# Patient Record
Sex: Female | Born: 1945 | Race: Black or African American | Hispanic: No | Smoking: Never smoker
Health system: Southern US, Community
[De-identification: ages and names within clinical notes are randomized; demographics above are authoritative.]

## PROBLEM LIST (undated history)

## (undated) DIAGNOSIS — K589 Irritable bowel syndrome without diarrhea: Secondary | ICD-10-CM

## (undated) DIAGNOSIS — F419 Anxiety disorder, unspecified: Secondary | ICD-10-CM

## (undated) DIAGNOSIS — R519 Headache, unspecified: Secondary | ICD-10-CM

## (undated) DIAGNOSIS — E785 Hyperlipidemia, unspecified: Secondary | ICD-10-CM

## (undated) DIAGNOSIS — E079 Disorder of thyroid, unspecified: Secondary | ICD-10-CM

## (undated) DIAGNOSIS — E230 Hypopituitarism: Secondary | ICD-10-CM

## (undated) DIAGNOSIS — R51 Headache: Secondary | ICD-10-CM

## (undated) DIAGNOSIS — K219 Gastro-esophageal reflux disease without esophagitis: Secondary | ICD-10-CM

## (undated) HISTORY — PX: BRAIN SURGERY: SHX531

## (undated) HISTORY — DX: Gastro-esophageal reflux disease without esophagitis: K21.9

## (undated) HISTORY — DX: Irritable bowel syndrome, unspecified: K58.9

## (undated) HISTORY — DX: Hyperlipidemia, unspecified: E78.5

## (undated) HISTORY — DX: Anxiety disorder, unspecified: F41.9

## (undated) HISTORY — DX: Headache, unspecified: R51.9

## (undated) HISTORY — DX: Hypopituitarism: E23.0

## (undated) HISTORY — PX: BREAST EXCISIONAL BIOPSY: SUR124

## (undated) HISTORY — DX: Headache: R51

## (undated) HISTORY — DX: Disorder of thyroid, unspecified: E07.9

---

## 1971-02-05 HISTORY — PX: TONSILLECTOMY: SUR1361

## 1988-02-05 HISTORY — PX: ABDOMINAL HYSTERECTOMY: SHX81

## 1992-02-05 HISTORY — PX: OVARIAN CYST SURGERY: SHX726

## 1997-08-26 ENCOUNTER — Ambulatory Visit (HOSPITAL_COMMUNITY): Admission: RE | Admit: 1997-08-26 | Discharge: 1997-08-26 | Payer: Self-pay | Admitting: *Deleted

## 1997-12-14 ENCOUNTER — Encounter: Payer: Self-pay | Admitting: Emergency Medicine

## 1997-12-14 ENCOUNTER — Emergency Department (HOSPITAL_COMMUNITY): Admission: EM | Admit: 1997-12-14 | Discharge: 1997-12-14 | Payer: Self-pay | Admitting: Emergency Medicine

## 1997-12-15 ENCOUNTER — Ambulatory Visit (HOSPITAL_COMMUNITY): Admission: RE | Admit: 1997-12-15 | Discharge: 1997-12-15 | Payer: Self-pay | Admitting: *Deleted

## 1997-12-27 ENCOUNTER — Ambulatory Visit (HOSPITAL_COMMUNITY): Admission: RE | Admit: 1997-12-27 | Discharge: 1997-12-27 | Payer: Self-pay | Admitting: *Deleted

## 1998-02-09 ENCOUNTER — Ambulatory Visit (HOSPITAL_COMMUNITY): Admission: RE | Admit: 1998-02-09 | Discharge: 1998-02-09 | Payer: Self-pay | Admitting: Gastroenterology

## 1999-01-05 ENCOUNTER — Ambulatory Visit (HOSPITAL_COMMUNITY): Admission: RE | Admit: 1999-01-05 | Discharge: 1999-01-05 | Payer: Self-pay | Admitting: *Deleted

## 1999-03-06 ENCOUNTER — Ambulatory Visit (HOSPITAL_COMMUNITY): Admission: RE | Admit: 1999-03-06 | Discharge: 1999-03-06 | Payer: Self-pay | Admitting: *Deleted

## 1999-06-20 ENCOUNTER — Encounter: Admission: RE | Admit: 1999-06-20 | Discharge: 1999-09-18 | Payer: Self-pay | Admitting: *Deleted

## 1999-08-28 ENCOUNTER — Encounter: Payer: Self-pay | Admitting: Gastroenterology

## 1999-08-28 ENCOUNTER — Encounter: Admission: RE | Admit: 1999-08-28 | Discharge: 1999-08-28 | Payer: Self-pay | Admitting: Gastroenterology

## 2000-01-08 ENCOUNTER — Encounter: Payer: Self-pay | Admitting: Obstetrics and Gynecology

## 2000-01-08 ENCOUNTER — Ambulatory Visit (HOSPITAL_COMMUNITY): Admission: RE | Admit: 2000-01-08 | Discharge: 2000-01-08 | Payer: Self-pay | Admitting: Obstetrics and Gynecology

## 2000-02-25 ENCOUNTER — Encounter: Payer: Self-pay | Admitting: Obstetrics and Gynecology

## 2000-02-25 ENCOUNTER — Encounter: Admission: RE | Admit: 2000-02-25 | Discharge: 2000-02-25 | Payer: Self-pay | Admitting: Obstetrics and Gynecology

## 2000-04-16 ENCOUNTER — Encounter: Payer: Self-pay | Admitting: Emergency Medicine

## 2000-04-16 ENCOUNTER — Emergency Department (HOSPITAL_COMMUNITY): Admission: EM | Admit: 2000-04-16 | Discharge: 2000-04-16 | Payer: Self-pay | Admitting: Emergency Medicine

## 2000-04-24 ENCOUNTER — Encounter: Admission: RE | Admit: 2000-04-24 | Discharge: 2000-06-23 | Payer: Self-pay | Admitting: Internal Medicine

## 2000-07-30 ENCOUNTER — Ambulatory Visit (HOSPITAL_COMMUNITY): Admission: RE | Admit: 2000-07-30 | Discharge: 2000-07-30 | Payer: Self-pay | Admitting: Gastroenterology

## 2001-01-06 ENCOUNTER — Encounter: Payer: Self-pay | Admitting: Internal Medicine

## 2001-01-06 ENCOUNTER — Ambulatory Visit (HOSPITAL_COMMUNITY): Admission: RE | Admit: 2001-01-06 | Discharge: 2001-01-06 | Payer: Self-pay | Admitting: Internal Medicine

## 2001-03-10 ENCOUNTER — Other Ambulatory Visit: Admission: RE | Admit: 2001-03-10 | Discharge: 2001-03-10 | Payer: Self-pay | Admitting: Obstetrics and Gynecology

## 2002-01-07 ENCOUNTER — Ambulatory Visit (HOSPITAL_COMMUNITY): Admission: RE | Admit: 2002-01-07 | Discharge: 2002-01-07 | Payer: Self-pay | Admitting: Gastroenterology

## 2002-01-12 ENCOUNTER — Ambulatory Visit (HOSPITAL_COMMUNITY): Admission: RE | Admit: 2002-01-12 | Discharge: 2002-01-12 | Payer: Self-pay | Admitting: Internal Medicine

## 2002-01-12 ENCOUNTER — Encounter: Payer: Self-pay | Admitting: Internal Medicine

## 2002-03-11 ENCOUNTER — Other Ambulatory Visit: Admission: RE | Admit: 2002-03-11 | Discharge: 2002-03-11 | Payer: Self-pay | Admitting: Obstetrics and Gynecology

## 2002-07-14 ENCOUNTER — Inpatient Hospital Stay (HOSPITAL_COMMUNITY): Admission: RE | Admit: 2002-07-14 | Discharge: 2002-07-15 | Payer: Self-pay | Admitting: Orthopaedic Surgery

## 2003-01-17 ENCOUNTER — Ambulatory Visit (HOSPITAL_COMMUNITY): Admission: RE | Admit: 2003-01-17 | Discharge: 2003-01-17 | Payer: Self-pay | Admitting: Internal Medicine

## 2003-03-11 ENCOUNTER — Emergency Department (HOSPITAL_COMMUNITY): Admission: EM | Admit: 2003-03-11 | Discharge: 2003-03-11 | Payer: Self-pay | Admitting: Emergency Medicine

## 2004-01-18 ENCOUNTER — Ambulatory Visit (HOSPITAL_COMMUNITY): Admission: RE | Admit: 2004-01-18 | Discharge: 2004-01-18 | Payer: Self-pay | Admitting: Internal Medicine

## 2004-03-15 ENCOUNTER — Emergency Department (HOSPITAL_COMMUNITY): Admission: EM | Admit: 2004-03-15 | Discharge: 2004-03-15 | Payer: Self-pay | Admitting: Emergency Medicine

## 2004-04-27 ENCOUNTER — Ambulatory Visit (HOSPITAL_COMMUNITY): Admission: RE | Admit: 2004-04-27 | Discharge: 2004-04-27 | Payer: Self-pay | Admitting: Gastroenterology

## 2004-07-13 ENCOUNTER — Encounter: Admission: RE | Admit: 2004-07-13 | Discharge: 2004-10-11 | Payer: Self-pay | Admitting: Internal Medicine

## 2014-02-04 HISTORY — PX: REPAIR RECTOCELE: SUR1206

## 2014-03-09 DIAGNOSIS — K219 Gastro-esophageal reflux disease without esophagitis: Secondary | ICD-10-CM | POA: Diagnosis not present

## 2014-03-09 DIAGNOSIS — E785 Hyperlipidemia, unspecified: Secondary | ICD-10-CM | POA: Diagnosis not present

## 2014-03-09 DIAGNOSIS — R194 Change in bowel habit: Secondary | ICD-10-CM | POA: Diagnosis not present

## 2014-03-31 DIAGNOSIS — K648 Other hemorrhoids: Secondary | ICD-10-CM | POA: Diagnosis not present

## 2014-03-31 DIAGNOSIS — D490 Neoplasm of unspecified behavior of digestive system: Secondary | ICD-10-CM | POA: Diagnosis not present

## 2014-03-31 DIAGNOSIS — D122 Benign neoplasm of ascending colon: Secondary | ICD-10-CM | POA: Diagnosis not present

## 2014-03-31 DIAGNOSIS — K649 Unspecified hemorrhoids: Secondary | ICD-10-CM | POA: Diagnosis not present

## 2014-03-31 DIAGNOSIS — K633 Ulcer of intestine: Secondary | ICD-10-CM | POA: Diagnosis not present

## 2014-03-31 DIAGNOSIS — R194 Change in bowel habit: Secondary | ICD-10-CM | POA: Diagnosis not present

## 2014-03-31 DIAGNOSIS — K635 Polyp of colon: Secondary | ICD-10-CM | POA: Diagnosis not present

## 2014-03-31 DIAGNOSIS — K6289 Other specified diseases of anus and rectum: Secondary | ICD-10-CM | POA: Diagnosis not present

## 2014-04-04 DIAGNOSIS — E785 Hyperlipidemia, unspecified: Secondary | ICD-10-CM | POA: Diagnosis not present

## 2014-04-04 DIAGNOSIS — K589 Irritable bowel syndrome without diarrhea: Secondary | ICD-10-CM | POA: Diagnosis not present

## 2014-04-04 DIAGNOSIS — M2660 Temporomandibular joint disorder, unspecified: Secondary | ICD-10-CM | POA: Diagnosis not present

## 2014-04-09 DIAGNOSIS — M6281 Muscle weakness (generalized): Secondary | ICD-10-CM | POA: Diagnosis not present

## 2014-04-09 DIAGNOSIS — E785 Hyperlipidemia, unspecified: Secondary | ICD-10-CM | POA: Diagnosis not present

## 2014-04-09 DIAGNOSIS — E559 Vitamin D deficiency, unspecified: Secondary | ICD-10-CM | POA: Diagnosis not present

## 2014-04-09 DIAGNOSIS — D649 Anemia, unspecified: Secondary | ICD-10-CM | POA: Diagnosis not present

## 2014-04-18 DIAGNOSIS — B351 Tinea unguium: Secondary | ICD-10-CM | POA: Diagnosis not present

## 2014-04-18 DIAGNOSIS — B078 Other viral warts: Secondary | ICD-10-CM | POA: Diagnosis not present

## 2014-04-27 DIAGNOSIS — K635 Polyp of colon: Secondary | ICD-10-CM | POA: Diagnosis not present

## 2014-04-27 DIAGNOSIS — E785 Hyperlipidemia, unspecified: Secondary | ICD-10-CM | POA: Diagnosis not present

## 2014-04-27 DIAGNOSIS — K5900 Constipation, unspecified: Secondary | ICD-10-CM | POA: Diagnosis not present

## 2014-05-27 DIAGNOSIS — N816 Rectocele: Secondary | ICD-10-CM | POA: Diagnosis not present

## 2014-05-27 DIAGNOSIS — K5902 Outlet dysfunction constipation: Secondary | ICD-10-CM | POA: Diagnosis not present

## 2014-06-06 DIAGNOSIS — K5900 Constipation, unspecified: Secondary | ICD-10-CM | POA: Diagnosis not present

## 2014-06-06 DIAGNOSIS — N816 Rectocele: Secondary | ICD-10-CM | POA: Diagnosis not present

## 2014-06-27 DIAGNOSIS — E785 Hyperlipidemia, unspecified: Secondary | ICD-10-CM | POA: Diagnosis not present

## 2014-06-27 DIAGNOSIS — R079 Chest pain, unspecified: Secondary | ICD-10-CM | POA: Diagnosis not present

## 2014-07-01 DIAGNOSIS — N816 Rectocele: Secondary | ICD-10-CM | POA: Diagnosis not present

## 2014-07-01 DIAGNOSIS — K5902 Outlet dysfunction constipation: Secondary | ICD-10-CM | POA: Diagnosis not present

## 2014-07-19 DIAGNOSIS — N816 Rectocele: Secondary | ICD-10-CM | POA: Diagnosis not present

## 2014-08-03 DIAGNOSIS — Z86010 Personal history of colonic polyps: Secondary | ICD-10-CM | POA: Diagnosis not present

## 2014-08-03 DIAGNOSIS — K5900 Constipation, unspecified: Secondary | ICD-10-CM | POA: Diagnosis not present

## 2014-08-03 DIAGNOSIS — K30 Functional dyspepsia: Secondary | ICD-10-CM | POA: Diagnosis not present

## 2014-08-03 DIAGNOSIS — N816 Rectocele: Secondary | ICD-10-CM | POA: Diagnosis not present

## 2014-08-30 DIAGNOSIS — Z803 Family history of malignant neoplasm of breast: Secondary | ICD-10-CM | POA: Diagnosis not present

## 2014-08-30 DIAGNOSIS — Z1231 Encounter for screening mammogram for malignant neoplasm of breast: Secondary | ICD-10-CM | POA: Diagnosis not present

## 2014-08-31 DIAGNOSIS — K2900 Acute gastritis without bleeding: Secondary | ICD-10-CM | POA: Diagnosis not present

## 2014-08-31 DIAGNOSIS — Q401 Congenital hiatus hernia: Secondary | ICD-10-CM | POA: Diagnosis not present

## 2014-08-31 DIAGNOSIS — K529 Noninfective gastroenteritis and colitis, unspecified: Secondary | ICD-10-CM | POA: Diagnosis not present

## 2014-08-31 DIAGNOSIS — I10 Essential (primary) hypertension: Secondary | ICD-10-CM | POA: Diagnosis not present

## 2014-08-31 DIAGNOSIS — K208 Other esophagitis: Secondary | ICD-10-CM | POA: Diagnosis not present

## 2014-08-31 DIAGNOSIS — K6389 Other specified diseases of intestine: Secondary | ICD-10-CM | POA: Diagnosis not present

## 2014-08-31 DIAGNOSIS — R197 Diarrhea, unspecified: Secondary | ICD-10-CM | POA: Diagnosis not present

## 2014-08-31 DIAGNOSIS — K228 Other specified diseases of esophagus: Secondary | ICD-10-CM | POA: Diagnosis not present

## 2014-08-31 DIAGNOSIS — K449 Diaphragmatic hernia without obstruction or gangrene: Secondary | ICD-10-CM | POA: Diagnosis not present

## 2014-08-31 DIAGNOSIS — K3189 Other diseases of stomach and duodenum: Secondary | ICD-10-CM | POA: Diagnosis not present

## 2014-08-31 DIAGNOSIS — K210 Gastro-esophageal reflux disease with esophagitis: Secondary | ICD-10-CM | POA: Diagnosis not present

## 2014-08-31 DIAGNOSIS — K2980 Duodenitis without bleeding: Secondary | ICD-10-CM | POA: Diagnosis not present

## 2014-09-01 DIAGNOSIS — N816 Rectocele: Secondary | ICD-10-CM | POA: Diagnosis not present

## 2014-09-05 DIAGNOSIS — G933 Postviral fatigue syndrome: Secondary | ICD-10-CM | POA: Diagnosis not present

## 2014-09-05 DIAGNOSIS — Z01818 Encounter for other preprocedural examination: Secondary | ICD-10-CM | POA: Diagnosis not present

## 2014-09-05 DIAGNOSIS — I10 Essential (primary) hypertension: Secondary | ICD-10-CM | POA: Diagnosis not present

## 2014-09-05 DIAGNOSIS — D688 Other specified coagulation defects: Secondary | ICD-10-CM | POA: Diagnosis not present

## 2014-09-08 DIAGNOSIS — Z01810 Encounter for preprocedural cardiovascular examination: Secondary | ICD-10-CM | POA: Diagnosis not present

## 2014-09-08 DIAGNOSIS — E785 Hyperlipidemia, unspecified: Secondary | ICD-10-CM | POA: Diagnosis not present

## 2014-09-14 DIAGNOSIS — Z0000 Encounter for general adult medical examination without abnormal findings: Secondary | ICD-10-CM | POA: Diagnosis not present

## 2014-09-14 DIAGNOSIS — Z23 Encounter for immunization: Secondary | ICD-10-CM | POA: Diagnosis not present

## 2014-09-14 DIAGNOSIS — Z6827 Body mass index (BMI) 27.0-27.9, adult: Secondary | ICD-10-CM | POA: Diagnosis not present

## 2014-09-21 DIAGNOSIS — Z86010 Personal history of colonic polyps: Secondary | ICD-10-CM | POA: Diagnosis not present

## 2014-09-21 DIAGNOSIS — K209 Esophagitis, unspecified: Secondary | ICD-10-CM | POA: Diagnosis not present

## 2014-09-21 DIAGNOSIS — K5900 Constipation, unspecified: Secondary | ICD-10-CM | POA: Diagnosis not present

## 2014-09-21 DIAGNOSIS — K2970 Gastritis, unspecified, without bleeding: Secondary | ICD-10-CM | POA: Diagnosis not present

## 2014-09-27 DIAGNOSIS — Z7982 Long term (current) use of aspirin: Secondary | ICD-10-CM | POA: Diagnosis not present

## 2014-09-27 DIAGNOSIS — Z79899 Other long term (current) drug therapy: Secondary | ICD-10-CM | POA: Diagnosis not present

## 2014-09-27 DIAGNOSIS — N816 Rectocele: Secondary | ICD-10-CM | POA: Diagnosis not present

## 2014-09-27 DIAGNOSIS — N812 Incomplete uterovaginal prolapse: Secondary | ICD-10-CM | POA: Diagnosis not present

## 2014-09-27 DIAGNOSIS — E780 Pure hypercholesterolemia: Secondary | ICD-10-CM | POA: Diagnosis not present

## 2014-09-27 DIAGNOSIS — N186 End stage renal disease: Secondary | ICD-10-CM | POA: Diagnosis not present

## 2014-09-27 DIAGNOSIS — K5900 Constipation, unspecified: Secondary | ICD-10-CM | POA: Diagnosis not present

## 2014-09-27 DIAGNOSIS — Z01818 Encounter for other preprocedural examination: Secondary | ICD-10-CM | POA: Diagnosis not present

## 2014-09-28 DIAGNOSIS — K5900 Constipation, unspecified: Secondary | ICD-10-CM | POA: Diagnosis not present

## 2014-09-28 DIAGNOSIS — N816 Rectocele: Secondary | ICD-10-CM | POA: Diagnosis not present

## 2014-09-28 DIAGNOSIS — Z79899 Other long term (current) drug therapy: Secondary | ICD-10-CM | POA: Diagnosis not present

## 2014-09-28 DIAGNOSIS — E780 Pure hypercholesterolemia: Secondary | ICD-10-CM | POA: Diagnosis not present

## 2014-09-28 DIAGNOSIS — Z7982 Long term (current) use of aspirin: Secondary | ICD-10-CM | POA: Diagnosis not present

## 2014-10-04 DIAGNOSIS — N816 Rectocele: Secondary | ICD-10-CM | POA: Diagnosis not present

## 2014-10-13 DIAGNOSIS — E785 Hyperlipidemia, unspecified: Secondary | ICD-10-CM | POA: Diagnosis not present

## 2014-10-13 DIAGNOSIS — Z23 Encounter for immunization: Secondary | ICD-10-CM | POA: Diagnosis not present

## 2014-10-13 DIAGNOSIS — N816 Rectocele: Secondary | ICD-10-CM | POA: Diagnosis not present

## 2014-11-10 DIAGNOSIS — M79674 Pain in right toe(s): Secondary | ICD-10-CM | POA: Diagnosis not present

## 2014-11-10 DIAGNOSIS — B351 Tinea unguium: Secondary | ICD-10-CM | POA: Diagnosis not present

## 2014-11-10 DIAGNOSIS — M79675 Pain in left toe(s): Secondary | ICD-10-CM | POA: Diagnosis not present

## 2014-11-10 DIAGNOSIS — B070 Plantar wart: Secondary | ICD-10-CM | POA: Diagnosis not present

## 2014-12-19 DIAGNOSIS — L239 Allergic contact dermatitis, unspecified cause: Secondary | ICD-10-CM | POA: Diagnosis not present

## 2014-12-19 DIAGNOSIS — R21 Rash and other nonspecific skin eruption: Secondary | ICD-10-CM | POA: Diagnosis not present

## 2014-12-19 DIAGNOSIS — W57XXXA Bitten or stung by nonvenomous insect and other nonvenomous arthropods, initial encounter: Secondary | ICD-10-CM | POA: Diagnosis not present

## 2014-12-21 DIAGNOSIS — K5909 Other constipation: Secondary | ICD-10-CM | POA: Diagnosis not present

## 2014-12-21 DIAGNOSIS — K209 Esophagitis, unspecified: Secondary | ICD-10-CM | POA: Diagnosis not present

## 2014-12-21 DIAGNOSIS — K30 Functional dyspepsia: Secondary | ICD-10-CM | POA: Diagnosis not present

## 2014-12-21 DIAGNOSIS — K2970 Gastritis, unspecified, without bleeding: Secondary | ICD-10-CM | POA: Diagnosis not present

## 2015-01-03 DIAGNOSIS — N390 Urinary tract infection, site not specified: Secondary | ICD-10-CM | POA: Diagnosis not present

## 2015-01-03 DIAGNOSIS — N816 Rectocele: Secondary | ICD-10-CM | POA: Diagnosis not present

## 2015-01-16 DIAGNOSIS — B070 Plantar wart: Secondary | ICD-10-CM | POA: Diagnosis not present

## 2015-02-13 DIAGNOSIS — R079 Chest pain, unspecified: Secondary | ICD-10-CM | POA: Diagnosis not present

## 2015-02-13 DIAGNOSIS — K581 Irritable bowel syndrome with constipation: Secondary | ICD-10-CM | POA: Diagnosis not present

## 2015-02-13 DIAGNOSIS — E785 Hyperlipidemia, unspecified: Secondary | ICD-10-CM | POA: Diagnosis not present

## 2015-02-16 DIAGNOSIS — F329 Major depressive disorder, single episode, unspecified: Secondary | ICD-10-CM | POA: Diagnosis not present

## 2015-02-16 DIAGNOSIS — E785 Hyperlipidemia, unspecified: Secondary | ICD-10-CM | POA: Diagnosis not present

## 2015-02-16 DIAGNOSIS — K581 Irritable bowel syndrome with constipation: Secondary | ICD-10-CM | POA: Diagnosis not present

## 2015-02-16 DIAGNOSIS — R5383 Other fatigue: Secondary | ICD-10-CM | POA: Diagnosis not present

## 2015-03-29 DIAGNOSIS — K30 Functional dyspepsia: Secondary | ICD-10-CM | POA: Diagnosis not present

## 2015-03-29 DIAGNOSIS — K209 Esophagitis, unspecified: Secondary | ICD-10-CM | POA: Diagnosis not present

## 2015-03-29 DIAGNOSIS — K2970 Gastritis, unspecified, without bleeding: Secondary | ICD-10-CM | POA: Diagnosis not present

## 2015-03-29 DIAGNOSIS — K5900 Constipation, unspecified: Secondary | ICD-10-CM | POA: Diagnosis not present

## 2015-04-03 DIAGNOSIS — B070 Plantar wart: Secondary | ICD-10-CM | POA: Diagnosis not present

## 2015-04-04 DIAGNOSIS — K589 Irritable bowel syndrome without diarrhea: Secondary | ICD-10-CM | POA: Diagnosis not present

## 2015-04-04 DIAGNOSIS — R002 Palpitations: Secondary | ICD-10-CM | POA: Diagnosis not present

## 2015-04-04 DIAGNOSIS — R911 Solitary pulmonary nodule: Secondary | ICD-10-CM | POA: Diagnosis not present

## 2015-04-04 DIAGNOSIS — R0789 Other chest pain: Secondary | ICD-10-CM | POA: Diagnosis not present

## 2015-04-04 DIAGNOSIS — R0600 Dyspnea, unspecified: Secondary | ICD-10-CM | POA: Diagnosis not present

## 2015-04-04 DIAGNOSIS — K219 Gastro-esophageal reflux disease without esophagitis: Secondary | ICD-10-CM | POA: Diagnosis not present

## 2015-04-04 DIAGNOSIS — R202 Paresthesia of skin: Secondary | ICD-10-CM | POA: Diagnosis not present

## 2015-04-04 DIAGNOSIS — E785 Hyperlipidemia, unspecified: Secondary | ICD-10-CM | POA: Diagnosis not present

## 2015-04-04 DIAGNOSIS — R531 Weakness: Secondary | ICD-10-CM | POA: Diagnosis not present

## 2015-04-04 DIAGNOSIS — R0602 Shortness of breath: Secondary | ICD-10-CM | POA: Diagnosis not present

## 2015-04-04 DIAGNOSIS — M26609 Unspecified temporomandibular joint disorder, unspecified side: Secondary | ICD-10-CM | POA: Diagnosis not present

## 2015-04-04 DIAGNOSIS — M79604 Pain in right leg: Secondary | ICD-10-CM | POA: Diagnosis not present

## 2015-04-04 DIAGNOSIS — M546 Pain in thoracic spine: Secondary | ICD-10-CM | POA: Diagnosis not present

## 2015-04-04 DIAGNOSIS — Z743 Need for continuous supervision: Secondary | ICD-10-CM | POA: Diagnosis not present

## 2015-04-04 DIAGNOSIS — M79605 Pain in left leg: Secondary | ICD-10-CM | POA: Diagnosis not present

## 2015-04-04 DIAGNOSIS — R079 Chest pain, unspecified: Secondary | ICD-10-CM | POA: Diagnosis not present

## 2015-04-05 DIAGNOSIS — R202 Paresthesia of skin: Secondary | ICD-10-CM | POA: Diagnosis not present

## 2015-04-05 DIAGNOSIS — R51 Headache: Secondary | ICD-10-CM | POA: Diagnosis not present

## 2015-04-05 DIAGNOSIS — E785 Hyperlipidemia, unspecified: Secondary | ICD-10-CM | POA: Diagnosis not present

## 2015-04-05 DIAGNOSIS — R079 Chest pain, unspecified: Secondary | ICD-10-CM | POA: Diagnosis not present

## 2015-04-05 DIAGNOSIS — K589 Irritable bowel syndrome without diarrhea: Secondary | ICD-10-CM | POA: Diagnosis not present

## 2015-04-06 DIAGNOSIS — K210 Gastro-esophageal reflux disease with esophagitis: Secondary | ICD-10-CM | POA: Diagnosis not present

## 2015-04-06 DIAGNOSIS — Z87891 Personal history of nicotine dependence: Secondary | ICD-10-CM | POA: Diagnosis not present

## 2015-04-06 DIAGNOSIS — R079 Chest pain, unspecified: Secondary | ICD-10-CM | POA: Diagnosis not present

## 2015-04-06 DIAGNOSIS — R911 Solitary pulmonary nodule: Secondary | ICD-10-CM | POA: Diagnosis not present

## 2015-04-06 DIAGNOSIS — E785 Hyperlipidemia, unspecified: Secondary | ICD-10-CM | POA: Diagnosis not present

## 2015-04-10 DIAGNOSIS — R079 Chest pain, unspecified: Secondary | ICD-10-CM | POA: Diagnosis not present

## 2015-04-10 DIAGNOSIS — K589 Irritable bowel syndrome without diarrhea: Secondary | ICD-10-CM | POA: Diagnosis not present

## 2015-04-10 DIAGNOSIS — E785 Hyperlipidemia, unspecified: Secondary | ICD-10-CM | POA: Diagnosis not present

## 2015-04-27 DIAGNOSIS — G44201 Tension-type headache, unspecified, intractable: Secondary | ICD-10-CM | POA: Diagnosis not present

## 2015-04-27 DIAGNOSIS — R42 Dizziness and giddiness: Secondary | ICD-10-CM | POA: Diagnosis not present

## 2015-04-29 DIAGNOSIS — R799 Abnormal finding of blood chemistry, unspecified: Secondary | ICD-10-CM | POA: Diagnosis not present

## 2015-04-29 DIAGNOSIS — R2232 Localized swelling, mass and lump, left upper limb: Secondary | ICD-10-CM | POA: Diagnosis not present

## 2015-04-29 DIAGNOSIS — R700 Elevated erythrocyte sedimentation rate: Secondary | ICD-10-CM | POA: Diagnosis not present

## 2015-05-02 DIAGNOSIS — R2232 Localized swelling, mass and lump, left upper limb: Secondary | ICD-10-CM | POA: Diagnosis not present

## 2015-05-05 DIAGNOSIS — M79606 Pain in leg, unspecified: Secondary | ICD-10-CM | POA: Diagnosis not present

## 2015-05-06 DIAGNOSIS — M79606 Pain in leg, unspecified: Secondary | ICD-10-CM | POA: Diagnosis not present

## 2015-05-23 DIAGNOSIS — M47812 Spondylosis without myelopathy or radiculopathy, cervical region: Secondary | ICD-10-CM | POA: Diagnosis not present

## 2015-05-23 DIAGNOSIS — M79661 Pain in right lower leg: Secondary | ICD-10-CM | POA: Diagnosis not present

## 2015-05-23 DIAGNOSIS — R51 Headache: Secondary | ICD-10-CM | POA: Diagnosis not present

## 2015-05-23 DIAGNOSIS — M79606 Pain in leg, unspecified: Secondary | ICD-10-CM | POA: Diagnosis not present

## 2015-05-23 DIAGNOSIS — R208 Other disturbances of skin sensation: Secondary | ICD-10-CM | POA: Diagnosis not present

## 2015-05-26 DIAGNOSIS — M0609 Rheumatoid arthritis without rheumatoid factor, multiple sites: Secondary | ICD-10-CM | POA: Diagnosis not present

## 2015-05-26 DIAGNOSIS — M0569 Rheumatoid arthritis of multiple sites with involvement of other organs and systems: Secondary | ICD-10-CM | POA: Diagnosis not present

## 2015-05-26 DIAGNOSIS — D500 Iron deficiency anemia secondary to blood loss (chronic): Secondary | ICD-10-CM | POA: Diagnosis not present

## 2015-05-26 DIAGNOSIS — D510 Vitamin B12 deficiency anemia due to intrinsic factor deficiency: Secondary | ICD-10-CM | POA: Diagnosis not present

## 2015-05-26 DIAGNOSIS — M3219 Other organ or system involvement in systemic lupus erythematosus: Secondary | ICD-10-CM | POA: Diagnosis not present

## 2015-06-05 DIAGNOSIS — B070 Plantar wart: Secondary | ICD-10-CM | POA: Diagnosis not present

## 2015-06-05 DIAGNOSIS — M79675 Pain in left toe(s): Secondary | ICD-10-CM | POA: Diagnosis not present

## 2015-06-05 DIAGNOSIS — M79674 Pain in right toe(s): Secondary | ICD-10-CM | POA: Diagnosis not present

## 2015-06-05 DIAGNOSIS — B351 Tinea unguium: Secondary | ICD-10-CM | POA: Diagnosis not present

## 2015-06-07 DIAGNOSIS — R220 Localized swelling, mass and lump, head: Secondary | ICD-10-CM | POA: Diagnosis not present

## 2015-06-13 DIAGNOSIS — G9389 Other specified disorders of brain: Secondary | ICD-10-CM | POA: Diagnosis not present

## 2015-06-15 DIAGNOSIS — M3219 Other organ or system involvement in systemic lupus erythematosus: Secondary | ICD-10-CM | POA: Diagnosis not present

## 2015-06-16 DIAGNOSIS — E236 Other disorders of pituitary gland: Secondary | ICD-10-CM | POA: Diagnosis not present

## 2015-06-16 DIAGNOSIS — Z01810 Encounter for preprocedural cardiovascular examination: Secondary | ICD-10-CM | POA: Diagnosis not present

## 2015-06-17 DIAGNOSIS — D352 Benign neoplasm of pituitary gland: Secondary | ICD-10-CM | POA: Diagnosis not present

## 2015-06-17 DIAGNOSIS — D688 Other specified coagulation defects: Secondary | ICD-10-CM | POA: Diagnosis not present

## 2015-06-17 DIAGNOSIS — Z01818 Encounter for other preprocedural examination: Secondary | ICD-10-CM | POA: Diagnosis not present

## 2015-06-17 DIAGNOSIS — H40003 Preglaucoma, unspecified, bilateral: Secondary | ICD-10-CM | POA: Diagnosis not present

## 2015-06-17 DIAGNOSIS — H04123 Dry eye syndrome of bilateral lacrimal glands: Secondary | ICD-10-CM | POA: Diagnosis not present

## 2015-06-17 DIAGNOSIS — R2232 Localized swelling, mass and lump, left upper limb: Secondary | ICD-10-CM | POA: Diagnosis not present

## 2015-06-17 DIAGNOSIS — H43813 Vitreous degeneration, bilateral: Secondary | ICD-10-CM | POA: Diagnosis not present

## 2015-06-17 DIAGNOSIS — G933 Postviral fatigue syndrome: Secondary | ICD-10-CM | POA: Diagnosis not present

## 2015-06-25 DIAGNOSIS — K219 Gastro-esophageal reflux disease without esophagitis: Secondary | ICD-10-CM | POA: Diagnosis not present

## 2015-06-25 DIAGNOSIS — R51 Headache: Secondary | ICD-10-CM | POA: Diagnosis not present

## 2015-06-25 DIAGNOSIS — D497 Neoplasm of unspecified behavior of endocrine glands and other parts of nervous system: Secondary | ICD-10-CM | POA: Diagnosis not present

## 2015-06-25 DIAGNOSIS — F419 Anxiety disorder, unspecified: Secondary | ICD-10-CM | POA: Diagnosis not present

## 2015-06-25 DIAGNOSIS — S161XXA Strain of muscle, fascia and tendon at neck level, initial encounter: Secondary | ICD-10-CM | POA: Diagnosis not present

## 2015-06-26 DIAGNOSIS — E236 Other disorders of pituitary gland: Secondary | ICD-10-CM | POA: Diagnosis not present

## 2015-06-26 DIAGNOSIS — E038 Other specified hypothyroidism: Secondary | ICD-10-CM | POA: Diagnosis not present

## 2015-06-26 DIAGNOSIS — D352 Benign neoplasm of pituitary gland: Secondary | ICD-10-CM | POA: Diagnosis not present

## 2015-06-26 DIAGNOSIS — E288 Other ovarian dysfunction: Secondary | ICD-10-CM | POA: Diagnosis not present

## 2015-06-27 DIAGNOSIS — E038 Other specified hypothyroidism: Secondary | ICD-10-CM | POA: Diagnosis not present

## 2015-06-27 DIAGNOSIS — E288 Other ovarian dysfunction: Secondary | ICD-10-CM | POA: Diagnosis not present

## 2015-06-27 DIAGNOSIS — E236 Other disorders of pituitary gland: Secondary | ICD-10-CM | POA: Diagnosis not present

## 2015-06-29 DIAGNOSIS — H40003 Preglaucoma, unspecified, bilateral: Secondary | ICD-10-CM | POA: Diagnosis not present

## 2015-06-29 DIAGNOSIS — H5340 Unspecified visual field defects: Secondary | ICD-10-CM | POA: Diagnosis not present

## 2015-06-29 DIAGNOSIS — H43813 Vitreous degeneration, bilateral: Secondary | ICD-10-CM | POA: Diagnosis not present

## 2015-06-29 DIAGNOSIS — H04123 Dry eye syndrome of bilateral lacrimal glands: Secondary | ICD-10-CM | POA: Diagnosis not present

## 2015-06-29 DIAGNOSIS — D352 Benign neoplasm of pituitary gland: Secondary | ICD-10-CM | POA: Diagnosis not present

## 2015-06-30 DIAGNOSIS — D532 Scorbutic anemia: Secondary | ICD-10-CM | POA: Diagnosis not present

## 2015-06-30 DIAGNOSIS — E038 Other specified hypothyroidism: Secondary | ICD-10-CM | POA: Diagnosis not present

## 2015-06-30 DIAGNOSIS — E288 Other ovarian dysfunction: Secondary | ICD-10-CM | POA: Diagnosis not present

## 2015-07-04 DIAGNOSIS — N816 Rectocele: Secondary | ICD-10-CM | POA: Diagnosis not present

## 2015-07-06 DIAGNOSIS — I517 Cardiomegaly: Secondary | ICD-10-CM | POA: Diagnosis not present

## 2015-07-07 DIAGNOSIS — Z01810 Encounter for preprocedural cardiovascular examination: Secondary | ICD-10-CM | POA: Diagnosis not present

## 2015-07-17 DIAGNOSIS — E236 Other disorders of pituitary gland: Secondary | ICD-10-CM | POA: Diagnosis not present

## 2015-07-17 DIAGNOSIS — E785 Hyperlipidemia, unspecified: Secondary | ICD-10-CM | POA: Diagnosis not present

## 2015-07-17 DIAGNOSIS — Z01810 Encounter for preprocedural cardiovascular examination: Secondary | ICD-10-CM | POA: Diagnosis not present

## 2015-07-19 DIAGNOSIS — E870 Hyperosmolality and hypernatremia: Secondary | ICD-10-CM | POA: Diagnosis not present

## 2015-07-19 DIAGNOSIS — R42 Dizziness and giddiness: Secondary | ICD-10-CM | POA: Diagnosis present

## 2015-07-19 DIAGNOSIS — D496 Neoplasm of unspecified behavior of brain: Secondary | ICD-10-CM | POA: Diagnosis not present

## 2015-07-19 DIAGNOSIS — E230 Hypopituitarism: Secondary | ICD-10-CM | POA: Diagnosis present

## 2015-07-19 DIAGNOSIS — J329 Chronic sinusitis, unspecified: Secondary | ICD-10-CM | POA: Diagnosis not present

## 2015-07-19 DIAGNOSIS — E232 Diabetes insipidus: Secondary | ICD-10-CM | POA: Diagnosis not present

## 2015-07-19 DIAGNOSIS — E237 Disorder of pituitary gland, unspecified: Secondary | ICD-10-CM | POA: Diagnosis not present

## 2015-07-19 DIAGNOSIS — E876 Hypokalemia: Secondary | ICD-10-CM | POA: Diagnosis not present

## 2015-07-19 DIAGNOSIS — I959 Hypotension, unspecified: Secondary | ICD-10-CM | POA: Diagnosis not present

## 2015-07-19 DIAGNOSIS — D497 Neoplasm of unspecified behavior of endocrine glands and other parts of nervous system: Secondary | ICD-10-CM | POA: Diagnosis not present

## 2015-07-19 DIAGNOSIS — I10 Essential (primary) hypertension: Secondary | ICD-10-CM | POA: Diagnosis present

## 2015-07-19 DIAGNOSIS — E785 Hyperlipidemia, unspecified: Secondary | ICD-10-CM | POA: Diagnosis present

## 2015-07-19 DIAGNOSIS — E236 Other disorders of pituitary gland: Secondary | ICD-10-CM | POA: Diagnosis not present

## 2015-07-19 DIAGNOSIS — K219 Gastro-esophageal reflux disease without esophagitis: Secondary | ICD-10-CM | POA: Diagnosis present

## 2015-07-19 DIAGNOSIS — Z780 Asymptomatic menopausal state: Secondary | ICD-10-CM | POA: Diagnosis not present

## 2015-07-19 DIAGNOSIS — D352 Benign neoplasm of pituitary gland: Secondary | ICD-10-CM | POA: Diagnosis not present

## 2015-07-19 DIAGNOSIS — G939 Disorder of brain, unspecified: Secondary | ICD-10-CM | POA: Diagnosis not present

## 2015-07-19 DIAGNOSIS — E2740 Unspecified adrenocortical insufficiency: Secondary | ICD-10-CM | POA: Diagnosis not present

## 2015-07-19 DIAGNOSIS — D443 Neoplasm of uncertain behavior of pituitary gland: Secondary | ICD-10-CM | POA: Diagnosis not present

## 2015-07-19 DIAGNOSIS — J3489 Other specified disorders of nose and nasal sinuses: Secondary | ICD-10-CM | POA: Diagnosis not present

## 2015-07-19 DIAGNOSIS — K589 Irritable bowel syndrome without diarrhea: Secondary | ICD-10-CM | POA: Diagnosis present

## 2015-07-19 DIAGNOSIS — G9389 Other specified disorders of brain: Secondary | ICD-10-CM | POA: Diagnosis not present

## 2015-07-19 DIAGNOSIS — E039 Hypothyroidism, unspecified: Secondary | ICD-10-CM | POA: Diagnosis present

## 2015-07-19 DIAGNOSIS — Z9889 Other specified postprocedural states: Secondary | ICD-10-CM | POA: Diagnosis not present

## 2015-07-19 DIAGNOSIS — R51 Headache: Secondary | ICD-10-CM | POA: Diagnosis present

## 2015-07-25 DIAGNOSIS — Z483 Aftercare following surgery for neoplasm: Secondary | ICD-10-CM | POA: Diagnosis not present

## 2015-07-25 DIAGNOSIS — D497 Neoplasm of unspecified behavior of endocrine glands and other parts of nervous system: Secondary | ICD-10-CM | POA: Diagnosis not present

## 2015-07-25 DIAGNOSIS — I10 Essential (primary) hypertension: Secondary | ICD-10-CM | POA: Diagnosis not present

## 2015-07-25 DIAGNOSIS — M6281 Muscle weakness (generalized): Secondary | ICD-10-CM | POA: Diagnosis not present

## 2015-07-25 DIAGNOSIS — D352 Benign neoplasm of pituitary gland: Secondary | ICD-10-CM | POA: Diagnosis not present

## 2015-07-26 DIAGNOSIS — E038 Other specified hypothyroidism: Secondary | ICD-10-CM | POA: Diagnosis not present

## 2015-07-26 DIAGNOSIS — D532 Scorbutic anemia: Secondary | ICD-10-CM | POA: Diagnosis not present

## 2015-07-26 DIAGNOSIS — E789 Disorder of lipoprotein metabolism, unspecified: Secondary | ICD-10-CM | POA: Diagnosis not present

## 2015-07-26 DIAGNOSIS — E798 Other disorders of purine and pyrimidine metabolism: Secondary | ICD-10-CM | POA: Diagnosis not present

## 2015-07-28 DIAGNOSIS — M6281 Muscle weakness (generalized): Secondary | ICD-10-CM | POA: Diagnosis not present

## 2015-07-28 DIAGNOSIS — D352 Benign neoplasm of pituitary gland: Secondary | ICD-10-CM | POA: Diagnosis not present

## 2015-07-28 DIAGNOSIS — I10 Essential (primary) hypertension: Secondary | ICD-10-CM | POA: Diagnosis not present

## 2015-07-28 DIAGNOSIS — Z483 Aftercare following surgery for neoplasm: Secondary | ICD-10-CM | POA: Diagnosis not present

## 2015-07-30 DIAGNOSIS — I10 Essential (primary) hypertension: Secondary | ICD-10-CM | POA: Diagnosis not present

## 2015-07-30 DIAGNOSIS — Z483 Aftercare following surgery for neoplasm: Secondary | ICD-10-CM | POA: Diagnosis not present

## 2015-07-30 DIAGNOSIS — D352 Benign neoplasm of pituitary gland: Secondary | ICD-10-CM | POA: Diagnosis not present

## 2015-07-30 DIAGNOSIS — M6281 Muscle weakness (generalized): Secondary | ICD-10-CM | POA: Diagnosis not present

## 2015-07-31 DIAGNOSIS — D352 Benign neoplasm of pituitary gland: Secondary | ICD-10-CM | POA: Diagnosis not present

## 2015-07-31 DIAGNOSIS — E038 Other specified hypothyroidism: Secondary | ICD-10-CM | POA: Diagnosis not present

## 2015-07-31 DIAGNOSIS — E288 Other ovarian dysfunction: Secondary | ICD-10-CM | POA: Diagnosis not present

## 2015-08-02 DIAGNOSIS — M6281 Muscle weakness (generalized): Secondary | ICD-10-CM | POA: Diagnosis not present

## 2015-08-02 DIAGNOSIS — I10 Essential (primary) hypertension: Secondary | ICD-10-CM | POA: Diagnosis not present

## 2015-08-02 DIAGNOSIS — Z483 Aftercare following surgery for neoplasm: Secondary | ICD-10-CM | POA: Diagnosis not present

## 2015-08-02 DIAGNOSIS — D352 Benign neoplasm of pituitary gland: Secondary | ICD-10-CM | POA: Diagnosis not present

## 2015-08-04 DIAGNOSIS — M6281 Muscle weakness (generalized): Secondary | ICD-10-CM | POA: Diagnosis not present

## 2015-08-04 DIAGNOSIS — I10 Essential (primary) hypertension: Secondary | ICD-10-CM | POA: Diagnosis not present

## 2015-08-04 DIAGNOSIS — Z483 Aftercare following surgery for neoplasm: Secondary | ICD-10-CM | POA: Diagnosis not present

## 2015-08-04 DIAGNOSIS — D352 Benign neoplasm of pituitary gland: Secondary | ICD-10-CM | POA: Diagnosis not present

## 2015-08-09 DIAGNOSIS — R140 Abdominal distension (gaseous): Secondary | ICD-10-CM | POA: Diagnosis not present

## 2015-08-09 DIAGNOSIS — K5909 Other constipation: Secondary | ICD-10-CM | POA: Diagnosis not present

## 2015-08-09 DIAGNOSIS — Z86010 Personal history of colonic polyps: Secondary | ICD-10-CM | POA: Diagnosis not present

## 2015-08-09 DIAGNOSIS — K209 Esophagitis, unspecified: Secondary | ICD-10-CM | POA: Diagnosis not present

## 2015-08-09 DIAGNOSIS — K2970 Gastritis, unspecified, without bleeding: Secondary | ICD-10-CM | POA: Diagnosis not present

## 2015-08-10 DIAGNOSIS — Z483 Aftercare following surgery for neoplasm: Secondary | ICD-10-CM | POA: Diagnosis not present

## 2015-08-10 DIAGNOSIS — B351 Tinea unguium: Secondary | ICD-10-CM | POA: Diagnosis not present

## 2015-08-10 DIAGNOSIS — I10 Essential (primary) hypertension: Secondary | ICD-10-CM | POA: Diagnosis not present

## 2015-08-10 DIAGNOSIS — D352 Benign neoplasm of pituitary gland: Secondary | ICD-10-CM | POA: Diagnosis not present

## 2015-08-10 DIAGNOSIS — M79674 Pain in right toe(s): Secondary | ICD-10-CM | POA: Diagnosis not present

## 2015-08-10 DIAGNOSIS — B070 Plantar wart: Secondary | ICD-10-CM | POA: Diagnosis not present

## 2015-08-10 DIAGNOSIS — M79675 Pain in left toe(s): Secondary | ICD-10-CM | POA: Diagnosis not present

## 2015-08-10 DIAGNOSIS — M6281 Muscle weakness (generalized): Secondary | ICD-10-CM | POA: Diagnosis not present

## 2015-08-11 DIAGNOSIS — D352 Benign neoplasm of pituitary gland: Secondary | ICD-10-CM | POA: Diagnosis not present

## 2015-08-11 DIAGNOSIS — M6281 Muscle weakness (generalized): Secondary | ICD-10-CM | POA: Diagnosis not present

## 2015-08-11 DIAGNOSIS — Z4889 Encounter for other specified surgical aftercare: Secondary | ICD-10-CM | POA: Diagnosis not present

## 2015-08-11 DIAGNOSIS — I10 Essential (primary) hypertension: Secondary | ICD-10-CM | POA: Diagnosis not present

## 2015-08-11 DIAGNOSIS — Z483 Aftercare following surgery for neoplasm: Secondary | ICD-10-CM | POA: Diagnosis not present

## 2015-08-14 DIAGNOSIS — D352 Benign neoplasm of pituitary gland: Secondary | ICD-10-CM | POA: Diagnosis not present

## 2015-08-14 DIAGNOSIS — H04123 Dry eye syndrome of bilateral lacrimal glands: Secondary | ICD-10-CM | POA: Diagnosis not present

## 2015-08-14 DIAGNOSIS — H43813 Vitreous degeneration, bilateral: Secondary | ICD-10-CM | POA: Diagnosis not present

## 2015-08-14 DIAGNOSIS — H5340 Unspecified visual field defects: Secondary | ICD-10-CM | POA: Diagnosis not present

## 2015-08-14 DIAGNOSIS — H524 Presbyopia: Secondary | ICD-10-CM | POA: Diagnosis not present

## 2015-08-14 DIAGNOSIS — H40003 Preglaucoma, unspecified, bilateral: Secondary | ICD-10-CM | POA: Diagnosis not present

## 2015-08-21 DIAGNOSIS — E288 Other ovarian dysfunction: Secondary | ICD-10-CM | POA: Diagnosis not present

## 2015-08-21 DIAGNOSIS — E038 Other specified hypothyroidism: Secondary | ICD-10-CM | POA: Diagnosis not present

## 2015-08-21 DIAGNOSIS — D352 Benign neoplasm of pituitary gland: Secondary | ICD-10-CM | POA: Diagnosis not present

## 2015-08-22 DIAGNOSIS — Z4889 Encounter for other specified surgical aftercare: Secondary | ICD-10-CM | POA: Diagnosis not present

## 2015-08-22 DIAGNOSIS — D352 Benign neoplasm of pituitary gland: Secondary | ICD-10-CM | POA: Diagnosis not present

## 2015-08-28 DIAGNOSIS — E038 Other specified hypothyroidism: Secondary | ICD-10-CM | POA: Diagnosis not present

## 2015-08-28 DIAGNOSIS — E870 Hyperosmolality and hypernatremia: Secondary | ICD-10-CM | POA: Diagnosis not present

## 2015-08-28 DIAGNOSIS — D352 Benign neoplasm of pituitary gland: Secondary | ICD-10-CM | POA: Diagnosis not present

## 2015-08-31 DIAGNOSIS — Z13820 Encounter for screening for osteoporosis: Secondary | ICD-10-CM | POA: Diagnosis not present

## 2015-08-31 DIAGNOSIS — M8580 Other specified disorders of bone density and structure, unspecified site: Secondary | ICD-10-CM | POA: Diagnosis not present

## 2015-08-31 DIAGNOSIS — Z780 Asymptomatic menopausal state: Secondary | ICD-10-CM | POA: Diagnosis not present

## 2015-08-31 DIAGNOSIS — Z1231 Encounter for screening mammogram for malignant neoplasm of breast: Secondary | ICD-10-CM | POA: Diagnosis not present

## 2015-09-19 DIAGNOSIS — D352 Benign neoplasm of pituitary gland: Secondary | ICD-10-CM | POA: Diagnosis not present

## 2015-09-21 DIAGNOSIS — Z86010 Personal history of colonic polyps: Secondary | ICD-10-CM | POA: Diagnosis not present

## 2015-09-21 DIAGNOSIS — K219 Gastro-esophageal reflux disease without esophagitis: Secondary | ICD-10-CM | POA: Diagnosis not present

## 2015-09-21 DIAGNOSIS — K5904 Chronic idiopathic constipation: Secondary | ICD-10-CM | POA: Diagnosis not present

## 2015-09-26 ENCOUNTER — Other Ambulatory Visit: Payer: Self-pay | Admitting: Endocrinology

## 2015-09-26 ENCOUNTER — Encounter: Payer: Self-pay | Admitting: Endocrinology

## 2015-09-26 ENCOUNTER — Ambulatory Visit (INDEPENDENT_AMBULATORY_CARE_PROVIDER_SITE_OTHER): Payer: Medicare Other | Admitting: Endocrinology

## 2015-09-26 DIAGNOSIS — Z86018 Personal history of other benign neoplasm: Secondary | ICD-10-CM | POA: Insufficient documentation

## 2015-09-26 DIAGNOSIS — D352 Benign neoplasm of pituitary gland: Secondary | ICD-10-CM | POA: Diagnosis not present

## 2015-09-26 NOTE — Patient Instructions (Signed)
blood tests are requested for you today.  We'll let you know about the results. Let's recheck the MRI.  you will receive a phone call, about a day and time for an appointment. Please come back for a follow-up appointment in 3 months.

## 2015-09-26 NOTE — Progress Notes (Signed)
   Subjective:    Patient ID: Nancy Herman, female    DOB: 1946-02-04, 70 y.o.   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  She now takes only synthroid.  She still has slight headache at the right side of the head, but no assoc numbness.   Past Medical History:  Diagnosis Date  . Dyslipidemia   . GERD (gastroesophageal reflux disease)   . IBS (irritable bowel syndrome)   . Pituitary insufficiency (HCC)     No past surgical history on file.  Social History   Social History  . Marital status: Widowed    Spouse name: N/A  . Number of children: N/A  . Years of education: N/A   Occupational History  . Not on file.   Social History Main Topics  . Smoking status: Never Smoker  . Smokeless tobacco: Never Used  . Alcohol use No  . Drug use: Unknown  . Sexual activity: Not on file   Other Topics Concern  . Not on file   Social History Narrative  . No narrative on file    No current outpatient prescriptions on file prior to visit.   No current facility-administered medications on file prior to visit.     No Known Allergies  No family history on file.  BP 110/70   Pulse 90   Wt 146 lb (66.2 kg)   LMP  (LMP Unknown)   SpO2 98%   Review of Systems denies loss of smell, syncope, rash, depression, seizure, menopausal sxs, visual loss, galactorrhea, easy bruising, change in facial appearance, rhinorrhea, and n/v.  She has excessive urination, and nocturia, 3 times per night. She has lost a few lbs.      Objective:   Physical Exam VS: see vs page GEN: no distress HEAD: head: no deformity eyes: no periorbital swelling, no proptosis external nose and ears are normal mouth: no lesion seen NECK: supple, thyroid is not enlarged CHEST WALL: no deformity LUNGS: clear to auscultation CV: reg rate and rhythm, no murmur ABD: abdomen is soft, nontender.  no hepatosplenomegaly.  not  distended.  no hernia MUSCULOSKELETAL: muscle bulk and strength are grossly normal.  no obvious joint swelling.  gait is normal and steady EXTEMITIES: no deformity.  no ulcer on the feet.  feet are of normal color and temp.  no edema PULSES: dorsalis pedis intact bilat.  no carotid bruit NEURO:  cn 2-12 grossly intact.   readily moves all 4's.  sensation is intact to touch on the feet SKIN:  Normal texture and temperature.  No rash or suspicious lesion is visible.   NODES:  None palpable at the neck PSYCH: alert, well-oriented.  Does not appear anxious nor depressed.    outside test results are reviewed: Prolactin=8.7 IGF-1=195 Cortisol=9.2  MRI: 18x7x17 mm pituitary adenoma  I have reviewed outside records, and summarized: Pt was noted to have sxs c/w  CVA, but pituitary adenoma was found instead   Lab Results  Component Value Date   TSH 0.82 09/26/2015      Assessment & Plan:  Pituitary macroadenoma, s/p resection. pituitary insufficiency. Apparently limited to the thyroid Polyuria, new, uncertain if pituitary-related: if this persists, pt could check 24-HR urine volume.

## 2015-09-27 ENCOUNTER — Encounter: Payer: Self-pay | Admitting: Endocrinology

## 2015-09-27 DIAGNOSIS — D352 Benign neoplasm of pituitary gland: Secondary | ICD-10-CM | POA: Diagnosis not present

## 2015-09-27 LAB — FOLLICLE STIMULATING HORMONE: FSH: 21.2 m[IU]/mL

## 2015-09-27 LAB — BASIC METABOLIC PANEL
BUN: 16 mg/dL (ref 6–23)
CO2: 31 mEq/L (ref 19–32)
Calcium: 9.3 mg/dL (ref 8.4–10.5)
Chloride: 107 mEq/L (ref 96–112)
Creatinine, Ser: 1.11 mg/dL (ref 0.40–1.20)
GFR: 62.84 mL/min (ref >60.00)
Glucose, Bld: 100 mg/dL — ABNORMAL HIGH (ref 70–99)
Potassium: 3.9 mEq/L (ref 3.5–5.1)
Sodium: 144 mEq/L (ref 135–145)

## 2015-09-27 LAB — PROLACTIN: Prolactin: 8.8 ng/mL

## 2015-09-27 LAB — T4, FREE: Free T4: 0.93 ng/dL (ref 0.60–1.60)

## 2015-09-27 LAB — TSH: TSH: 0.82 u[IU]/mL (ref 0.35–4.50)

## 2015-09-27 LAB — LUTEINIZING HORMONE: LH: 6.82 m[IU]/mL

## 2015-09-27 LAB — CORTISOL
Cortisol, Plasma: 10.4 ug/dL
Cortisol, Plasma: 22 ug/dL

## 2015-09-27 MED ORDER — COSYNTROPIN NICU IV SYRINGE 0.25 MG/ML (STANDARD DOSE)
0.2500 mg | Freq: Once | INTRAVENOUS | Status: AC
  Administered 2015-09-27: 0.25 mg via INTRAMUSCULAR

## 2015-09-28 ENCOUNTER — Telehealth: Payer: Self-pay

## 2015-09-28 NOTE — Telephone Encounter (Signed)
Called patient and gave lab results. Patient had no questions or concerns.  

## 2015-09-29 LAB — ACTH: C206 ACTH: 23 pg/mL (ref 6–50)

## 2015-10-04 LAB — ARGININE VASOPRESSIN HORMONE: Arginine Vasopressin: <1 pg/mL — ABNORMAL LOW

## 2015-10-05 DIAGNOSIS — H2513 Age-related nuclear cataract, bilateral: Secondary | ICD-10-CM | POA: Diagnosis not present

## 2015-10-10 ENCOUNTER — Telehealth: Payer: Self-pay | Admitting: Endocrinology

## 2015-10-10 NOTE — Telephone Encounter (Signed)
Patient need to know the phone number of where she is getting her MRI.  Please advise

## 2015-10-11 ENCOUNTER — Ambulatory Visit
Admission: RE | Admit: 2015-10-11 | Discharge: 2015-10-11 | Disposition: A | Discharge status: Home / Self Care | Payer: Medicare Other | Source: Ambulatory Visit | Attending: Endocrinology | Admitting: Endocrinology

## 2015-10-11 DIAGNOSIS — D352 Benign neoplasm of pituitary gland: Secondary | ICD-10-CM

## 2015-10-11 MED ORDER — GADOBENATE DIMEGLUMINE 529 MG/ML IV SOLN
10.0000 mL | Freq: Once | INTRAVENOUS | Status: AC | PRN
  Administered 2015-10-11: 7 mL via INTRAVENOUS

## 2015-10-13 NOTE — Telephone Encounter (Signed)
I contacted the patient. Patient wanted to see when her next MRI should be. Patient was advised to follow up with Dr. Loanne Drilling in 3 months and then we can decide when her next MRI should be scheduled. Patient voiced understanding,

## 2015-10-13 NOTE — Telephone Encounter (Signed)
Pt asking for return call for mri result follow up

## 2015-10-16 ENCOUNTER — Encounter: Payer: Self-pay | Admitting: Family Medicine

## 2015-10-16 ENCOUNTER — Ambulatory Visit (INDEPENDENT_AMBULATORY_CARE_PROVIDER_SITE_OTHER): Payer: Medicare Other | Admitting: Family Medicine

## 2015-10-16 DIAGNOSIS — F419 Anxiety disorder, unspecified: Secondary | ICD-10-CM | POA: Diagnosis not present

## 2015-10-16 DIAGNOSIS — J309 Allergic rhinitis, unspecified: Secondary | ICD-10-CM | POA: Insufficient documentation

## 2015-10-16 DIAGNOSIS — G4700 Insomnia, unspecified: Secondary | ICD-10-CM | POA: Diagnosis not present

## 2015-10-16 DIAGNOSIS — K581 Irritable bowel syndrome with constipation: Secondary | ICD-10-CM | POA: Insufficient documentation

## 2015-10-16 DIAGNOSIS — E785 Hyperlipidemia, unspecified: Secondary | ICD-10-CM | POA: Diagnosis not present

## 2015-10-16 DIAGNOSIS — K589 Irritable bowel syndrome without diarrhea: Secondary | ICD-10-CM | POA: Diagnosis not present

## 2015-10-16 MED ORDER — MELATONIN ER 5 MG PO TBCR: 5.0000 mg | EXTENDED_RELEASE_TABLET | Freq: Every day | ORAL | 3 refills | Status: DC

## 2015-10-16 MED ORDER — FLUOXETINE HCL 20 MG PO TABS: 20.0000 mg | ORAL_TABLET | Freq: Every day | ORAL | 3 refills | Status: DC

## 2015-10-16 NOTE — Progress Notes (Signed)
Pre visit review using our clinic review tool, if applicable. No additional management support is needed unless otherwise documented below in the visit note. 

## 2015-10-16 NOTE — Patient Instructions (Addendum)
A few things to remember from today's visit:   IBS (irritable bowel syndrome)  Hyperlipidemia - Plan: Lipid panel, Basic Metabolic Panel  Insomnia, unspecified  Anxiety disorder, unspecified - Plan: FLUoxetine (PROZAC) 20 MG tablet   Miralax daily as needed, Bisacodyl 5 mg daily at night, and Benefiber 3 times daily mixed with fluids.  Fluoxetine start 1/2 tab daily for 4-5 days and increase to 1 tab.  A few tips:  -As we age balance is not as good as it was, so there is a higher risks for falls. Please remove small rugs and furniture that is "in your way" and could increase the risk of falls. Stretching exercises may help with fall prevention: Yoga and Tai Chi are some examples. Low impact exercise is better, so you are not very achy the next day.  -Sun screen and avoidance of direct sun light recommended. Caution with dehydration, if working outdoors be sure to drink enough fluids.  - Some medications are not safe as we age, increases the risk of side effects and can potentially interact with other medication you are also taken;  including some of over the counter medications. Be sure to let me know when you start a new medication even if it is a dietary/vitamin supplement.   -Healthy diet low in red meet/animal fat and sugar + regular physical activity is recommended.       Please be sure medication list is accurate. If a new problem present, please set up appointment sooner than planned today.

## 2015-10-16 NOTE — Progress Notes (Signed)
HPI:   [name] is a 70 y.o. female, who is here today to establish care with me.  Former PCP: [place], Dr Raynelle Bring Last preventive routine visit: within a year ago.  Concerns today: Constipation, recent brain MRI, and urine frequency.   She lives alone, she is close to family and grandchildren.  Independent ADL's and IADL's. No falls in the past year and denies depression symptoms.  Hx of IBS, constipation, she was receiving samples from former PCP, 290 mg daily, causes diarrhea. Linzess lower dose didn't help much, her health insurance would not pay for Linzess. She thinks she tried Amitiza, she is not sure if this medication helped.   + Urine frequency, seldom urine urgency incontinence, the symptoms have been going on for years, stable.  Nocturia 2-3 times per night, not all the time. She denies dysuria, decreased urine output, gross hematuria, or abdominal pain.  Recent labs showed abnormal vasopressin, so that it isn't recommended a 24-hour urine test, which she is not interested in doing.    -S/p pituitary mass removal, She had brain MRI done recently and she would like to go through results. Denies severe/frequent headache, visual changes,focal weakness, or numbness/tingling. She follows with Dr Loanne Drilling, endocrinologists, last seen 09/26/15.  Brain MRI 10/11/15: Chronic small-vessel changes of the pons and cerebral hemispheric white matter.Previous trans-sphenoidal surgery. No evidence of residual pituitary mass. Also mentioned some packing material present within the sphenoid sinus.  She visited ENT 3 times because nose drainage after she had resection of pituitary adenoma, and recently followed with ENT here and she was told, "every thing was fine." She denies any purulent nasal drainage.   Lab Results  Component Value Date   TSH 0.82 09/26/2015   According to patient, she recently had eye examination and normal exam. History of  hypothyroidism, currently she is on thyroid hormone supplementation, Levothyroxine 50 mcg daily.    Lab Results  Component Value Date   TSH 0.82 09/26/2015     Hx of allergic rhinitis, she uses Neti pot every night, mentions that this causes some discomfort. She has tried intranasal steroids, sometimes because nose mucosa irritation.  Occasional nasal congestion and rhinorrhea. She denies any sinus pressure.  History of anxiety, in the past she took medication, she does not recall names, she remembers trying different medications but discontinued because weight gain. She also tried psychotherapy. She denies depressed mood or suicidal thoughts.  Insomnia, in the past she also took medication for sleep, didn't tolerate well. She has trouble falling asleep and wakes up a few times per night, sometimes she does not feel rested next day.    Hyperlipidemia:  Currently on Pravastatin 20 mg daily Following a low fat diet: Yes.  She has not noted side effects with medication. Not sure about last FLP.  GERD on Famotidine 20 mg daily.  Denies abdominal pain, nausea, vomiting, changes in bowel habits, blood in stool or melena.  She is reporting colonoscopy, 03/2015, otherwise negative.   Review of Systems  Constitutional: Negative for activity change, appetite change, fatigue, fever and unexpected weight change.  HENT: Positive for congestion and rhinorrhea. Negative for facial swelling, mouth sores, nosebleeds, postnasal drip, sinus pressure, trouble swallowing and voice change.   Eyes: Negative for pain, redness and visual disturbance.  Respiratory: Negative for cough, shortness of breath and wheezing.   Cardiovascular: Negative for chest pain, palpitations and leg swelling.  Gastrointestinal: Positive for constipation. Negative for abdominal pain, blood in  stool, nausea and vomiting.       Negative for changes in bowel habits.  Endocrine: Negative for cold intolerance, heat  intolerance, polydipsia and polyphagia.  Genitourinary: Positive for frequency and urgency. Negative for decreased urine volume, difficulty urinating, dysuria, flank pain and hematuria.  Musculoskeletal: Negative for gait problem and myalgias.  Skin: Negative for color change and rash.  Neurological: Negative for seizures, syncope, weakness, numbness and headaches.  Psychiatric/Behavioral: Positive for sleep disturbance. Negative for confusion and hallucinations. The patient is nervous/anxious.       Current Outpatient Prescriptions on File Prior to Visit  Medication Sig Dispense Refill  . aspirin EC 81 MG tablet Take 81 mg by mouth daily.    . famotidine (PEPCID) 20 MG tablet Take 20 mg by mouth 2 (two) times daily.    Marland Kitchen levothyroxine (SYNTHROID, LEVOTHROID) 50 MCG tablet Take 50 mcg by mouth daily before breakfast.    . pravastatin (PRAVACHOL) 20 MG tablet Take 20 mg by mouth daily.    . Probiotic Product (PROBIOTIC-10 PO) Take by mouth.     No current facility-administered medications on file prior to visit.      Past Medical History:  Diagnosis Date  . Allergy   . Dyslipidemia   . Frequent headaches   . GERD (gastroesophageal reflux disease)   . Hyperlipidemia   . IBS (irritable bowel syndrome)   . Pituitary insufficiency ([facility])   . Thyroid disease   . Urine incontinence    No Known Allergies  Family History  Problem Relation Age of Onset  . Heart disease Mother   . Alcohol abuse Mother   . Drug abuse Mother   . Heart disease Father   . Drug abuse Father   . Alcohol abuse Father     Social History   Social History  . Marital status: Widowed    Spouse name: N/A  . Number of children: N/A  . Years of education: N/A   Social History Main Topics  . Smoking status: Never Smoker  . Smokeless tobacco: Never Used  . Alcohol use No  . Drug use: Unknown  . Sexual activity: Not Asked   Other Topics Concern  . None   Social History Narrative  . None     Vitals:   10/16/15 0846  BP: 122/78  Pulse: 65  Resp: 12   O2 sat 97% RA.  Body mass index is 25.54 kg/m.    Physical Exam  Nursing note and vitals reviewed. Constitutional: She is oriented to person, place, and time. She appears well-developed and well-nourished. No distress.  HENT:  Head: Atraumatic.  Mouth/Throat: Oropharynx is clear and moist and mucous membranes are normal.  Eyes: Conjunctivae and EOM are normal. Pupils are equal, round, and reactive to light.  Neck: No thyroid mass and no thyromegaly present.  Cardiovascular: Normal rate and regular rhythm.   No murmur heard. Pulses:      Posterior tibial pulses are 2+ on the right side, and 2+ on the left side.  Respiratory: Effort normal and breath sounds normal. No respiratory distress.  GI: Soft. She exhibits no mass. There is no hepatomegaly. There is no tenderness.  Musculoskeletal: She exhibits no edema.  Lymphadenopathy:    She has no cervical adenopathy.  Neurological: She is alert and oriented to person, place, and time. She has normal strength. No cranial nerve deficit. Coordination and gait normal.  Skin: Skin is warm. No erythema.  Psychiatric: Her speech is normal. Her mood appears anxious.  Cognition and memory are normal.  Well groomed, good eye contact.      ASSESSMENT AND PLAN:     Nancy Herman was seen today for new patient (initial visit).  Diagnoses and all orders for this visit:  IBS (irritable bowel syndrome)  We discussed physiopathology of the disease, explained that this is a chronic illness. She prefers to hold on prescription medication, Amitiza. Miralax daily and Bisacodyl 5 mg daily as needed. Benefiber tid daily. Adequate hydration. F/U in 3 weeks.   Hyperlipidemia  No changes in current management, will follow labs done today and will give further recommendations accordingly. Low fat diet also to continue. F/U in 6-12 months.   -     Lipid panel; Future -      Basic Metabolic Panel; Future  Insomnia, unspecified  Good sleep hygiene. She will try Melatonin, some side effects discussed. Follow-up in 3 weeks.  -     Melatonin ER 5 MG TBCR; Take 5 mg by mouth at bedtime.  Anxiety disorder, unspecified  Symptomatic. After discussion of some treatment options as well as side effects, she agrees with trying Fluoxetine. I recommended starting fluoxetine half tablet for 4-5 days and then increase to one tablet daily if well tolerated. She understands side effects. Follow-up in 3-4 weeks, before if needed.  -     FLUoxetine (PROZAC) 20 MG tablet; Take 1 tablet (20 mg total) by mouth daily.  Allergic rhinitis, unspecific  For now she will try nasal saline at night. Recommended stopping Neti pot. We'll consider intranasal steroid and/or OTC antihistaminic if needed.   -Today she is not fasting, so she will come for fasting labs in a week-at the time of your visit I do not have records from former PCP.       Elius Etheredge G. Martinique, MD  [facility]. [place] office.

## 2015-10-19 ENCOUNTER — Other Ambulatory Visit: Payer: Self-pay | Admitting: Family Medicine

## 2015-10-19 ENCOUNTER — Other Ambulatory Visit: Payer: Self-pay

## 2015-10-19 MED ORDER — FLUOXETINE HCL 20 MG PO CAPS: 20.0000 mg | ORAL_CAPSULE | Freq: Every day | ORAL | 3 refills | Status: DC

## 2015-10-20 ENCOUNTER — Telehealth: Payer: Self-pay | Admitting: Family Medicine

## 2015-10-20 NOTE — Telephone Encounter (Signed)
I prescribed Fluoxetine last OV to treat anxiety. I was hoping she was already taking it. She can take one cap daily in the morning. Medications in general have side effects, some patients do not have any, so it is hard to tell how she is going to do. Usually side effects are mild and transient.  Thanks, BJ

## 2015-10-20 NOTE — Telephone Encounter (Signed)
Left voicemail letting patient know she can take medication in the morning & to call with any questions.

## 2015-10-20 NOTE — Telephone Encounter (Signed)
Please advise 

## 2015-10-20 NOTE — Telephone Encounter (Signed)
Pt would liek to know when she should take her FLUoxetine (PROZAC) 20 MG capsule  Pt states instructions tell you it could cause drowsiness, so she wants to know if she should take at night or am.

## 2015-10-22 ENCOUNTER — Telehealth: Payer: Self-pay | Admitting: Endocrinology

## 2015-10-22 NOTE — Telephone Encounter (Signed)
please call patient: Lab says your 24-HR urine was 2300 ML, which is normal--good

## 2015-10-23 ENCOUNTER — Other Ambulatory Visit (INDEPENDENT_AMBULATORY_CARE_PROVIDER_SITE_OTHER): Payer: Medicare Other

## 2015-10-23 DIAGNOSIS — E785 Hyperlipidemia, unspecified: Secondary | ICD-10-CM | POA: Diagnosis not present

## 2015-10-23 LAB — POC URINALSYSI DIPSTICK (AUTOMATED)
Bilirubin, UA: NEGATIVE
Blood, UA: NEGATIVE
Glucose, UA: NEGATIVE
Ketones, UA: NEGATIVE
Leukocytes, UA: NEGATIVE
Nitrite, UA: NEGATIVE
Protein, UA: NEGATIVE
Spec Grav, UA: 1.01
Urobilinogen, UA: 0.2
pH, UA: 7

## 2015-10-23 LAB — LIPID PANEL
Cholesterol: 200 mg/dL (ref 0–200)
HDL: 82 mg/dL (ref >39.00)
LDL Cholesterol: 104 mg/dL — ABNORMAL HIGH (ref 0–99)
NonHDL: 118
Total CHOL/HDL Ratio: 2
Triglycerides: 71 mg/dL (ref 0.0–149.0)
VLDL: 14.2 mg/dL (ref 0.0–40.0)

## 2015-10-23 LAB — BASIC METABOLIC PANEL
BUN: 8 mg/dL (ref 6–23)
CO2: 30 mEq/L (ref 19–32)
Calcium: 9.2 mg/dL (ref 8.4–10.5)
Chloride: 108 mEq/L (ref 96–112)
Creatinine, Ser: 0.86 mg/dL (ref 0.40–1.20)
GFR: 84.35 mL/min (ref >60.00)
Glucose, Bld: 79 mg/dL (ref 70–99)
Potassium: 4.4 mEq/L (ref 3.5–5.1)
Sodium: 145 mEq/L (ref 135–145)

## 2015-10-23 NOTE — Telephone Encounter (Signed)
I contacted the patient and advised of message via voicemail. Requested a call back if the patient would like to discuss.  

## 2015-11-06 ENCOUNTER — Encounter: Payer: Self-pay | Admitting: Family Medicine

## 2015-11-06 ENCOUNTER — Ambulatory Visit (INDEPENDENT_AMBULATORY_CARE_PROVIDER_SITE_OTHER)
Admission: RE | Admit: 2015-11-06 | Discharge: 2015-11-06 | Disposition: A | Discharge status: Home / Self Care | Payer: Medicare Other | Source: Ambulatory Visit | Attending: Family Medicine | Admitting: Family Medicine

## 2015-11-06 ENCOUNTER — Ambulatory Visit (INDEPENDENT_AMBULATORY_CARE_PROVIDER_SITE_OTHER): Payer: Medicare Other | Admitting: Family Medicine

## 2015-11-06 VITALS — BP 112/62 | HR 80 | Temp 98.3°F | Resp 12 | Ht 63.5 in | Wt 142.5 lb

## 2015-11-06 DIAGNOSIS — M533 Sacrococcygeal disorders, not elsewhere classified: Secondary | ICD-10-CM

## 2015-11-06 DIAGNOSIS — K219 Gastro-esophageal reflux disease without esophagitis: Secondary | ICD-10-CM | POA: Insufficient documentation

## 2015-11-06 DIAGNOSIS — K581 Irritable bowel syndrome with constipation: Secondary | ICD-10-CM

## 2015-11-06 DIAGNOSIS — Z23 Encounter for immunization: Secondary | ICD-10-CM

## 2015-11-06 DIAGNOSIS — G4700 Insomnia, unspecified: Secondary | ICD-10-CM

## 2015-11-06 DIAGNOSIS — F411 Generalized anxiety disorder: Secondary | ICD-10-CM

## 2015-11-06 NOTE — Progress Notes (Signed)
HPI:   [name] is a 70 y.o. female, who is here today to follow on IBS-C,anxiety, and insomnia.  She was seen on 10/16/15, when Fluoxetine was added to treat anxiety.  Falling asleep better, still waking up a few times. Now she feels tire and goes to bed around 8 pm.  She did not try Melatonin.  Feeling "lonely" sometimes. She denies depressed mood, suicidal thoughts, or worsening anxiety. She is involved in social activities: she goes to the gym with her friend and participate in church.  She lives close by her daughter, living by herself.  Overall she has tolerated the medication well. She is still complaining about urinary frequency, which is chronic; she denies gross hematuria or dysuria. Hx of pituitary insufficiency.  She mentions that she had a "hot flash" yesterday, started from upper chest to face and sweating, a couple min duration.   She is currently following with Dr. Loanne Drilling, endocrinologist, recently she collected a 24 hour urine test.   Also Hx of IBS-C, non pharmacologic treatment was recommended because did not tolerate Linzess max dose (diarreha) and lower dose did not help.    Constipation has improved, having softer stools, 1-2 times daily. She has been on Benefiber 3 times per day. Still feeling like she does not completely empty and needs to go a second time, denies blood in stool or dyschezia. Colonoscopy 2016.  Today she mentions localized intermittent pain on right sacral area when she feels the urge to have a bowel movement and with palpation/pressing area. It is alleviated by defecation, she describes it as pressure sensation, mild to moderate, not radiated, and has had it since the beginning of this year.She doe snot recall having it last year.   She denies any trauma. She has not noted skin rash or changes on area.   Concerns today: She discontinued Pepcid and noted some retrosternal chest discomfort a few nights ago,  relived by elevating bed head.She thinks she ate something right before bed time, she also takes Pravastatin at bedtime with 4 oz of water. + Heartburn and acid reflux. Appetite still not good, she is trying to eat small meals more frequent, "force" herself to eat.   Denies exertional chest pain, dyspnea, palpitation, claudication, focal weakness, or edema.  Denies abdominal pain, nausea, vomiting, or melena.  Resumed Pepcid yesterday.    Review of Systems  Constitutional: Negative for activity change, appetite change, fatigue, fever and unexpected weight change.  HENT: Negative for mouth sores, nosebleeds and trouble swallowing.   Respiratory: Negative for cough, shortness of breath and wheezing.   Cardiovascular: Negative for chest pain, palpitations and leg swelling.  Gastrointestinal: Positive for constipation. Negative for abdominal pain, blood in stool, nausea and vomiting.  Genitourinary: Negative for decreased urine volume, difficulty urinating, dysuria and hematuria.  Musculoskeletal: Positive for back pain. Negative for gait problem and myalgias.  Skin: Negative for rash and wound.  Neurological: Negative for syncope, weakness, numbness and headaches.  Psychiatric/Behavioral: Positive for sleep disturbance. Negative for confusion, hallucinations and suicidal ideas. The patient is nervous/anxious.       Current Outpatient Prescriptions on File Prior to Visit  Medication Sig Dispense Refill  . aspirin EC 81 MG tablet Take 81 mg by mouth daily.    . famotidine (PEPCID) 20 MG tablet Take 20 mg by mouth 2 (two) times daily.    Marland Kitchen FLUoxetine (PROZAC) 20 MG capsule Take 1 capsule (20 mg total) by mouth daily. 30 capsule  3  . levothyroxine (SYNTHROID, LEVOTHROID) 50 MCG tablet Take 50 mcg by mouth daily before breakfast.    . pravastatin (PRAVACHOL) 20 MG tablet Take 20 mg by mouth daily.    . Probiotic Product (PROBIOTIC-10 PO) Take by mouth.     No current  facility-administered medications on file prior to visit.      Past Medical History:  Diagnosis Date  . Allergy   . Anxiety   . Dyslipidemia   . Frequent headaches   . GERD (gastroesophageal reflux disease)   . Hyperlipidemia   . IBS (irritable bowel syndrome)   . Pituitary insufficiency ([place])   . Thyroid disease   . Urine incontinence    No Known Allergies  Social History   Social History  . Marital status: Single    Spouse name: N/A  . Number of children: N/A  . Years of education: N/A   Social History Main Topics  . Smoking status: Never Smoker  . Smokeless tobacco: Never Used  . Alcohol use No  . Drug use: No  . Sexual activity: No   Other Topics Concern  . None   Social History Narrative  . None    Vitals:   11/06/15 1033  BP: 112/62  Pulse: 80  Resp: 12  Temp: 98.3 F (36.8 C)   Body mass index is 24.85 kg/m.   Wt Readings from Last 3 Encounters:  11/06/15 142 lb 8 oz (64.6 kg)  10/16/15 146 lb 8 oz (66.5 kg)  09/26/15 146 lb (66.2 kg)      Physical Exam  Nursing note and vitals reviewed. Constitutional: She is oriented to person, place, and time. She appears well-developed and well-nourished. She does not appear ill. No distress.  HENT:  Head: Atraumatic.  Mouth/Throat: Oropharynx is clear and moist and mucous membranes are normal.  Eyes: Conjunctivae and EOM are normal.  Neck: No thyroid mass and no thyromegaly present.  Cardiovascular: Normal rate and regular rhythm.   No murmur heard. Pulses:      Posterior tibial pulses are 2+ on the right side, and 2+ on the left side.  Respiratory: Effort normal and breath sounds normal. No respiratory distress.  GI: Soft. She exhibits no mass. There is no hepatomegaly. There is no tenderness.  Musculoskeletal: She exhibits no edema.       Back:  Tenderness upon deep palpation right sacrococcygeal area, no deformity, masses, or erythema/skin changes noted.  Lymphadenopathy:    She has no  cervical adenopathy.  Neurological: She is alert and oriented to person, place, and time. She has normal strength. No cranial nerve deficit. Coordination and gait normal.  Skin: Skin is warm. No rash noted. No erythema.  Psychiatric: Her speech is normal. Her mood appears anxious.  Well groomed, good eye contact.      ASSESSMENT AND PLAN:     Nancy Herman was seen today for follow-up.  Diagnoses and all orders for this visit:  Gastroesophageal reflux disease, esophagitis presence not specified  Continue Pepcid 20 mg bid. GERD precautions discussed. She can take Pravastatin with supper instead bedtime. Instructed about warning signs.   Sacral back pain  Because no prior Hx of back pain and no trauma, plain imaging ordered. For now we will monitor.  -     DG Sacrum/Coccyx; Future  Irritable bowel syndrome with constipation  Improved. Continue Benefiber and adequate fluid intake. I have not received copy of last colonoscopy.   Insomnia, unspecified type  Improved. Urinary frequency is also  aggravating sleep (thought to be related to pituitary dysfunction). Good sleep hygiene.   Generalized anxiety disorder  Stable. Tolerating Fluoxetine well, no changes in dose. We reviewed some side effects, I noted some weight loss, which could be aggravated by medication as well as for intake. TSH in 09/2015 in normal range. We will continue monitoring. Follow-up in 3-4 months, before if needed.   Need for immunization against influenza -     Flu vaccine HIGH DOSE PF       -Ms. LOANNA GRANDISON was advised to return sooner than planned today if new concerns arise.       Lataunya Ruud G. Martinique, MD  [facility]. [place] office.

## 2015-11-06 NOTE — Patient Instructions (Addendum)
A few things to remember from today's visit:   Need for immunization against influenza - Plan: Flu vaccine HIGH DOSE PF  Sacral back pain - Plan: DG Sacrum/Coccyx  Irritable bowel syndrome with constipation  Insomnia, unspecified type  Generalized anxiety disorder  Gastroesophageal reflux disease, esophagitis presence not specified    Avoid foods that make your symptoms worse, for example coffee, chocolate,pepermeint,alcohol, and greasy food. Raising the head of your bed about 6 inches may help with nocturnal symptoms.   Avoid lying down for 3 hours after eating.  Instead 3 large meals daily try small and more frequent meals during the day.  Take Pepcid daily.   You should be evaluated immediately if bloody vomiting, bloody stools, black stools (like tar), difficulty swallowing, food gets stuck on the way down or choking when eating. Abnormal weight loss or severe abdominal pain.  If symptoms are not resolved sometimes endoscopy is necessary.   Please be sure medication list is accurate. If a new problem present, please set up appointment sooner than planned today.

## 2015-11-21 ENCOUNTER — Telehealth: Payer: Self-pay | Admitting: Family Medicine

## 2015-11-21 NOTE — Telephone Encounter (Signed)
I think we discussed some side effects and this can happen with Fluoxetine because decreases appetite. If side effects are mild, not severe, usually resolve in a few days/weeks.  Thanks, BJ

## 2015-11-21 NOTE — Telephone Encounter (Signed)
Per patient call having a problem with medication FLUoxetine (PROZAC) 20 MG capsule pt experience weight loss  Should she continue to take medication or should she come in please advise

## 2015-11-21 NOTE — Telephone Encounter (Signed)
See below

## 2015-11-21 NOTE — Telephone Encounter (Signed)
Spoke with patient and relayed the message below. Advised patient to take it for a few more days and let us know how she is doing then.

## 2015-12-04 ENCOUNTER — Telehealth: Payer: Self-pay | Admitting: Family Medicine

## 2015-12-04 MED ORDER — PRAVASTATIN SODIUM 20 MG PO TABS: 20.0000 mg | ORAL_TABLET | Freq: Every day | ORAL | 2 refills | Status: DC

## 2015-12-04 NOTE — Telephone Encounter (Signed)
Pt request refill  pravastatin (PRAVACHOL) 20 MG tablet  90 day  Pt states Dr Martinique has never filled this for her.  [facility]/ [address]

## 2015-12-04 NOTE — Telephone Encounter (Signed)
Rx has been sent to pharmacy

## 2015-12-24 NOTE — Progress Notes (Signed)
Subjective:    Patient ID: Nancy Herman, female    DOB: 05-27-1945, 70 y.o.   MRN: [id number]  HPI Pt returns for f/u of pituitary adenoma (was 1.8 mm diameter; dx'ed and resected in 2017; she now takes only synthroid; ACTH stim test was normal; LH was only 7; VP was undetectable; f/u MRI in late 2017 showed no residual tumor; 24-HR urine was 2300 ml; prolactin was normal).  pt states she feels well in general, except for frequent urination.  She has nocturia 1-2 times per night.   Past Medical History:  Diagnosis Date  . Allergy   . Anxiety   . Dyslipidemia   . Frequent headaches   . GERD (gastroesophageal reflux disease)   . Hyperlipidemia   . IBS (irritable bowel syndrome)   . 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 Thyroid disease   . Urine incontinence     Past Surgical History:  Procedure Laterality Date  . BRAIN SURGERY      Social History   Social History  . Marital status: Single    Spouse name: N/A  . Number of children: N/A  . Years of education: N/A   Occupational History  . Not on file.   Social History Main Topics  . Smoking status: Never Smoker  . Smokeless tobacco: Never Used  . Alcohol use No  . Drug use: No  . Sexual activity: No   Other Topics Concern  . Not on file   Social History Narrative  . No narrative on file    Current Outpatient Prescriptions on File Prior to Visit  Medication Sig Dispense Refill  . aspirin EC 81 MG tablet Take 81 mg by mouth daily.    . famotidine (PEPCID) 20 MG tablet Take 20 mg by mouth daily. 1 tab twice daily    . FLUoxetine (PROZAC) 20 MG capsule Take 1 capsule (20 mg total) by mouth daily. 30 capsule 3  . pravastatin (PRAVACHOL) 20 MG tablet Take 1 tablet (20 mg total) by mouth daily. 90 tablet 2  . Probiotic Product (PROBIOTIC-10 PO) Take by mouth.     No current facility-administered medications on file prior to visit.     No Known Allergies  Family History  Problem Relation Age of Onset  . Heart  disease Mother   . Alcohol abuse Mother   . Drug abuse Mother   . Heart disease Father   . Drug abuse Father   . Alcohol abuse Father    BP 122/70   Pulse 65   Ht 5' 3.5" (1.613 m)   Wt 140 lb (63.5 kg)   LMP  (LMP Unknown)   SpO2 97%   BMI 24.41 kg/m   Review of Systems Denies headache.      Objective:   Physical Exam VITAL SIGNS:  See vs page.  GENERAL: no distress.  Ext: no edema.  Lab Results  Component Value Date   TSH 0.53 12/26/2015   Lab Results  Component Value Date   CREATININE 0.97 12/26/2015   BUN 11 12/26/2015   NA 140 12/26/2015   K 4.0 12/26/2015   CL 104 12/26/2015   CO2 28 12/26/2015  LH=7 FSH=21    Assessment & Plan:  Central hypothyroidism: well-replaced.   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     Patient is advised the following: Patient Instructions  blood tests  are requested for you today.  We'll let you know about the results.  Please come back for a follow-up appointment in 6 months.

## 2015-12-26 ENCOUNTER — Ambulatory Visit (INDEPENDENT_AMBULATORY_CARE_PROVIDER_SITE_OTHER): Payer: Medicare Other | Admitting: Endocrinology

## 2015-12-26 DIAGNOSIS — E230 Hypopituitarism: Secondary | ICD-10-CM | POA: Diagnosis not present

## 2015-12-26 LAB — LUTEINIZING HORMONE: LH: 5.99 m[IU]/mL

## 2015-12-26 LAB — BASIC METABOLIC PANEL
BUN: 11 mg/dL (ref 6–23)
CO2: 28 mEq/L (ref 19–32)
Calcium: 9.5 mg/dL (ref 8.4–10.5)
Chloride: 104 mEq/L (ref 96–112)
Creatinine, Ser: 0.97 mg/dL (ref 0.40–1.20)
GFR: 73.37 mL/min (ref >60.00)
Glucose, Bld: 90 mg/dL (ref 70–99)
Potassium: 4 mEq/L (ref 3.5–5.1)
Sodium: 140 mEq/L (ref 135–145)

## 2015-12-26 LAB — T4, FREE: Free T4: 1.1 ng/dL (ref 0.60–1.60)

## 2015-12-26 LAB — TSH: TSH: 0.53 u[IU]/mL (ref 0.35–4.50)

## 2015-12-26 LAB — FOLLICLE STIMULATING HORMONE: FSH: 21.2 m[IU]/mL

## 2015-12-26 NOTE — Patient Instructions (Addendum)
blood tests are requested for you today.  We'll let you know about the results.   Please come back for a follow-up appointment in 6 months.  

## 2015-12-27 MED ORDER — LEVOTHYROXINE SODIUM 50 MCG PO TABS: 50.0000 ug | ORAL_TABLET | Freq: Every day | ORAL | 3 refills | Status: DC

## 2016-01-16 DIAGNOSIS — L603 Nail dystrophy: Secondary | ICD-10-CM | POA: Diagnosis not present

## 2016-01-16 DIAGNOSIS — D2371 Other benign neoplasm of skin of right lower limb, including hip: Secondary | ICD-10-CM | POA: Diagnosis not present

## 2016-01-16 DIAGNOSIS — M21961 Unspecified acquired deformity of right lower leg: Secondary | ICD-10-CM | POA: Diagnosis not present

## 2016-01-31 DIAGNOSIS — M21961 Unspecified acquired deformity of right lower leg: Secondary | ICD-10-CM | POA: Diagnosis not present

## 2016-01-31 DIAGNOSIS — L603 Nail dystrophy: Secondary | ICD-10-CM | POA: Diagnosis not present

## 2016-01-31 DIAGNOSIS — D2371 Other benign neoplasm of skin of right lower limb, including hip: Secondary | ICD-10-CM | POA: Diagnosis not present

## 2016-02-08 ENCOUNTER — Encounter: Payer: Self-pay | Admitting: Family Medicine

## 2016-02-08 ENCOUNTER — Ambulatory Visit (INDEPENDENT_AMBULATORY_CARE_PROVIDER_SITE_OTHER): Payer: Medicare HMO | Admitting: Family Medicine

## 2016-02-08 VITALS — BP 118/80 | HR 72 | Resp 12 | Ht 63.5 in | Wt 142.1 lb

## 2016-02-08 DIAGNOSIS — R05 Cough: Secondary | ICD-10-CM

## 2016-02-08 DIAGNOSIS — J309 Allergic rhinitis, unspecified: Secondary | ICD-10-CM | POA: Diagnosis not present

## 2016-02-08 DIAGNOSIS — J069 Acute upper respiratory infection, unspecified: Secondary | ICD-10-CM

## 2016-02-08 DIAGNOSIS — K219 Gastro-esophageal reflux disease without esophagitis: Secondary | ICD-10-CM

## 2016-02-08 DIAGNOSIS — R059 Cough, unspecified: Secondary | ICD-10-CM

## 2016-02-08 MED ORDER — BENZONATATE 100 MG PO CAPS: 200.0000 mg | ORAL_CAPSULE | Freq: Two times a day (BID) | ORAL | 0 refills | Status: AC | PRN

## 2016-02-08 MED ORDER — FLUOXETINE HCL 20 MG PO CAPS: 20.0000 mg | ORAL_CAPSULE | Freq: Every day | ORAL | 1 refills | Status: DC

## 2016-02-08 MED ORDER — OMEPRAZOLE 20 MG PO CPDR: 20.0000 mg | DELAYED_RELEASE_CAPSULE | Freq: Every day | ORAL | 3 refills | Status: DC

## 2016-02-08 NOTE — Patient Instructions (Addendum)
A few things to remember from today's visit:   URI, acute - Plan: benzonatate (TESSALON) 100 MG capsule  Gastroesophageal reflux disease, esophagitis presence not specified - Plan: omeprazole (PRILOSEC) 20 MG capsule  Allergic rhinitis, unspecified chronicity, unspecified seasonality, unspecified trigger   Symptomatic treatment: Over the counter Acetaminophen 500 mg and/or Ibuprofen (400-600 mg) if there is not contraindications; you can alternate in between both every 4-6 hours. Gargles with saline water and throat lozenges might also help. Cold fluids.    Seek prompt medical evaluation if you are having difficulty breathing, mouth swelling, throat closing up, not able to swallow liquids (drooling), skin rash/bruising, or worsening symptoms.  Please follow up in 2 weeks if not any better.     Avoid foods that make your symptoms worse, for example coffee, chocolate,pepermeint,alcohol, and greasy food. Raising the head of your bed about 6 inches may help with nocturnal symptoms.    Avoid lying down for 3 hours after eating.  Instead 3 large meals daily try small and more frequent meals during the day.    You should be evaluated immediately if bloody vomiting, bloody stools, black stools (like tar), difficulty swallowing, food gets stuck on the way down or choking when eating. Abnormal weight loss or severe abdominal pain.  If symptoms are not resolved sometimes endoscopy is necessary.  Please be sure medication list is accurate. If a new problem present, please set up appointment sooner than planned today.

## 2016-02-08 NOTE — Progress Notes (Signed)
Pre visit review using our clinic review tool, if applicable. No additional management support is needed unless otherwise documented below in the visit note. 

## 2016-02-08 NOTE — Progress Notes (Signed)
HPI:  ACUTE VISIT:  Chief Complaint  Patient presents with  . Sore Throat  . Cough    [name] is a 71 y.o. female, who is here today complaining of 2 weeks of respiratory symptoms, intermittently.  She attributes some of her symptoms to GERD, currently she is on Pepcid 20 mg daily. Still has intermittent heartburn, exacerbated by certain food intake. She has not identified alleviating factors.   Denies abdominal pain, nausea, vomiting, changes in bowel habits, blood in stool or melena.   + Sore throat.  + Productive cough with clear sputum, which exacerbates sore throat.  She denies fever, chill, or myalgias. Mild nasal congestion and post nasal drainage.  Denies chest pain, dyspnea, or wheezing.   No Hx of recent travel. No sick contact. No known insect bite. + Hx of allergies.  OTC medications for this problem: Lozenges and gargles with saline. Cough and sore throat otherwise stable, rest of respiratory symptoms improving.  She is also having dental issues, wonders if this can aggravate sore throat, planning on scheduling dental appt.    Review of Systems  Constitutional: Negative for activity change, appetite change, fatigue and fever.  HENT: Positive for congestion, postnasal drip and sore throat. Negative for ear pain, mouth sores, sinus pressure, sneezing, trouble swallowing and voice change.   Eyes: Negative for discharge, redness and itching.  Respiratory: Positive for cough. Negative for shortness of breath and wheezing.   Cardiovascular: Negative for chest pain.  Gastrointestinal: Negative for abdominal pain, nausea and vomiting.  Musculoskeletal: Negative for back pain and myalgias.  Skin: Negative for rash.  Allergic/Immunologic: Positive for environmental allergies.  Neurological: Negative for syncope, weakness and headaches.  Hematological: Negative for adenopathy. Does not bruise/bleed easily.  Psychiatric/Behavioral:  Negative for confusion. The patient is nervous/anxious.       Current Outpatient Prescriptions on File Prior to Visit  Medication Sig Dispense Refill  . famotidine (PEPCID) 20 MG tablet Take 20 mg by mouth daily. 1 tab twice daily    . levothyroxine (SYNTHROID, LEVOTHROID) 50 MCG tablet Take 1 tablet (50 mcg total) by mouth daily before breakfast. 90 tablet 3  . pravastatin (PRAVACHOL) 20 MG tablet Take 1 tablet (20 mg total) by mouth daily. 90 tablet 2  . Probiotic Product (PROBIOTIC-10 PO) Take by mouth.     No current facility-administered medications on file prior to visit.      Past Medical History:  Diagnosis Date  . Allergy   . Anxiety   . Dyslipidemia   . Frequent headaches   . GERD (gastroesophageal reflux disease)   . Hyperlipidemia   . IBS (irritable bowel syndrome)   . 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 Thyroid disease   . Urine incontinence    No Known Allergies  Social History   Social History  . Marital status: Single    Spouse name: N/A  . Number of children: N/A  . Years of education: N/A   Social History Main Topics  . Smoking status: Never Smoker  . Smokeless tobacco: Never Used  . Alcohol use No  . Drug use: No  . Sexual activity: No   Other Topics Concern  . None   Social History Narrative  . None    Vitals:   02/08/16 1108  BP: 118/80  Pulse: 72  Resp: 12    O2 sat 98% at RA.   Body mass index is 24.78 kg/m.    Physical Exam  Nursing note  and vitals reviewed. Constitutional: She is oriented to person, place, and time. She appears well-developed and well-nourished. She does not appear ill. No distress.  HENT:  Head: Atraumatic.  Right Ear: Tympanic membrane, external ear and ear canal normal.  Left Ear: Tympanic membrane, external ear and ear canal normal.  Nose: Right sinus exhibits no maxillary sinus tenderness and no frontal sinus tenderness. Left sinus exhibits no maxillary sinus tenderness and no frontal sinus  tenderness.  Mouth/Throat: Uvula is midline, oropharynx is clear and moist and mucous membranes are normal.  Post nasal drainage.  Eyes: Conjunctivae are normal.  Neck: No muscular tenderness present. No edema and no erythema present. No thyroid mass and no thyromegaly present.  Cardiovascular: Normal rate and regular rhythm.   No murmur heard. Respiratory: Effort normal and breath sounds normal. No stridor. No respiratory distress.  A few times during OV non productive cough.  Lymphadenopathy:       Head (right side): No submandibular adenopathy present.       Head (left side): Submandibular adenopathy present.    She has no cervical adenopathy.  Left submandibular gland mildly enlarged, no tenderness but states that feels "different" upon palpation.  Neurological: She is alert and oriented to person, place, and time. She has normal strength.  Skin: Skin is warm. No rash noted. No erythema.  Psychiatric: Her speech is normal. Her mood appears anxious.  Well groomed, good eye contact.      ASSESSMENT AND PLAN:     Nancy Herman was seen today for sore throat and cough.  Diagnoses and all orders for this visit:  Gastroesophageal reflux disease, esophagitis presence not specified  Still symptomatic. This problem aggravating cough. GERD precautions discussed. Continue Pepcid, Omeprazole 20 mg added. Keep Follow-up appointment.  -     omeprazole (PRILOSEC) 20 MG capsule; Take 1 capsule (20 mg total) by mouth daily before breakfast.  Upper respiratory tract infection, unspecified type  Symptoms suggests an initial viral etiology, now with residual symptoms. Next OV will re-evaluate left submandibular gland. Instructed to monitor for signs of complications.  F/U as needed.   -     benzonatate (TESSALON) 100 MG capsule; Take 2 capsules (200 mg total) by mouth 2 (two) times daily as needed for cough.  Allergic rhinitis, unspecified chronicity, unspecified seasonality,  unspecified trigger  Could also aggravate cough and nasal congestion. Continue nasal saline for now.  Cough I also explained that cough and nasal congestion can last a few days and sometimes weeks. Benzonatate may help with cough. Other problems could exacerbate cough.    Other orders  Requesting refills for Fluoxetine.  -     FLUoxetine (PROZAC) 20 MG capsule; Take 1 capsule (20 mg total) by mouth daily.   -Strongly recommend scheduling dental appointment.   -[name] advised to return or notify a doctor immediately if symptoms worsen or new concerns arise.       Caree Wolpert G. Martinique, MD  [facility]. [place] office.

## 2016-02-19 NOTE — Progress Notes (Signed)
Subjective:    Patient ID: Nancy Herman, female    DOB: 1945-12-17, 71 y.o.   MRN: [id number]  HPI Pt returns for f/u of pituitary adenoma (was 1.8 mm diameter; dx'ed and resected in 2017 (in [place]); she now takes only synthroid; ACTH stim test was normal; LH was only 7; VP was undetectable; f/u MRI in late 2017 showed no residual tumor; 24-HR urine was 2300 ml; prolactin was normal).  pt states she feels well in general, except for frequent urination.  She has slight ant neck pain, but no swelling.   Past Medical History:  Diagnosis Date  . Allergy   . Anxiety   . Dyslipidemia   . Frequent headaches   . GERD (gastroesophageal reflux disease)   . Hyperlipidemia   . IBS (irritable bowel syndrome)   . 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 Thyroid disease   . Urine incontinence     Past Surgical History:  Procedure Laterality Date  . BRAIN SURGERY      Social History   Social History  . Marital status: Single    Spouse name: N/A  . Number of children: N/A  . Years of education: N/A   Occupational History  . Not on file.   Social History Main Topics  . Smoking status: Never Smoker  . Smokeless tobacco: Never Used  . Alcohol use No  . Drug use: No  . Sexual activity: No   Other Topics Concern  . Not on file   Social History Narrative  . No narrative on file    Current Outpatient Prescriptions on File Prior to Visit  Medication Sig Dispense Refill  . famotidine (PEPCID) 20 MG tablet Take 20 mg by mouth daily. 1 tab twice daily    . FLUoxetine (PROZAC) 20 MG capsule Take 1 capsule (20 mg total) by mouth daily. 90 capsule 1  . levothyroxine (SYNTHROID, LEVOTHROID) 50 MCG tablet Take 1 tablet (50 mcg total) by mouth daily before breakfast. 90 tablet 3  . omeprazole (PRILOSEC) 20 MG capsule Take 1 capsule (20 mg total) by mouth daily before breakfast. 30 capsule 3  . pravastatin (PRAVACHOL) 20 MG tablet Take 1 tablet (20 mg total) by mouth daily. 90 tablet 2  .  Probiotic Product (PROBIOTIC-10 PO) Take by mouth.     No current facility-administered medications on file prior to visit.     No Known Allergies  Family History  Problem Relation Age of Onset  . Heart disease Mother   . Alcohol abuse Mother   . Drug abuse Mother   . Heart disease Father   . Drug abuse Father   . Alcohol abuse Father     BP 122/80   Pulse 89   Ht 5' 3.5" (1.613 m)   Wt 142 lb (64.4 kg)   LMP  (LMP Unknown)   SpO2 94%   BMI 24.76 kg/m   Review of Systems Denies weight change    Objective:   Physical Exam VITAL SIGNS:  See vs page.   GENERAL: no distress.   Ext: no edema.   Lab Results  Component Value Date   TSH 0.99 02/20/2016      Assessment & Plan:  Central hypothyroidism: well-replaced.   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    Patient is advised the following: Patient Instructions  Thyroid blood tests are requested for you today.  We'll let you know about the results.  Please come back for a follow-up appointment in  6 months.

## 2016-02-20 ENCOUNTER — Ambulatory Visit (INDEPENDENT_AMBULATORY_CARE_PROVIDER_SITE_OTHER): Payer: Medicare HMO | Admitting: Endocrinology

## 2016-02-20 VITALS — BP 122/80 | HR 89 | Ht 63.5 in | Wt 142.0 lb

## 2016-02-20 DIAGNOSIS — E230 Hypopituitarism: Secondary | ICD-10-CM | POA: Diagnosis not present

## 2016-02-20 LAB — TSH: TSH: 0.99 u[IU]/mL (ref 0.35–4.50)

## 2016-02-20 LAB — T4, FREE: Free T4: 0.8 ng/dL (ref 0.60–1.60)

## 2016-02-20 NOTE — Patient Instructions (Signed)
Thyroid blood tests are requested for you today.  We'll let you know about the results.  Please come back for a follow-up appointment in 6 months.   

## 2016-03-04 ENCOUNTER — Encounter: Payer: Self-pay | Admitting: Family Medicine

## 2016-03-04 ENCOUNTER — Ambulatory Visit (INDEPENDENT_AMBULATORY_CARE_PROVIDER_SITE_OTHER): Payer: Medicare HMO | Admitting: Family Medicine

## 2016-03-04 VITALS — BP 120/78 | HR 77 | Temp 98.4°F | Resp 12 | Ht 63.5 in | Wt 139.5 lb

## 2016-03-04 DIAGNOSIS — R053 Chronic cough: Secondary | ICD-10-CM

## 2016-03-04 DIAGNOSIS — J309 Allergic rhinitis, unspecified: Secondary | ICD-10-CM | POA: Diagnosis not present

## 2016-03-04 DIAGNOSIS — R05 Cough: Secondary | ICD-10-CM | POA: Diagnosis not present

## 2016-03-04 DIAGNOSIS — M542 Cervicalgia: Secondary | ICD-10-CM | POA: Diagnosis not present

## 2016-03-04 DIAGNOSIS — K219 Gastro-esophageal reflux disease without esophagitis: Secondary | ICD-10-CM | POA: Diagnosis not present

## 2016-03-04 MED ORDER — OMEPRAZOLE 20 MG PO CPDR: 20.0000 mg | DELAYED_RELEASE_CAPSULE | Freq: Two times a day (BID) | ORAL | 1 refills | Status: DC

## 2016-03-04 MED ORDER — AZELASTINE HCL 0.1 % NA SOLN: 2.0000 | Freq: Two times a day (BID) | NASAL | 3 refills | Status: DC

## 2016-03-04 NOTE — Progress Notes (Signed)
HPI:   [name] is a 71 y.o. female, who is here today to follow on some chronic problems addressed on 02/07/17.   Nancy Herman is still coughing, Nancy Herman states that Nancy Herman has salt testing in her mouth after episodes of  Cough and feel like Nancy Herman is "choking" sometimes, causing nausea. Nancy Herman denies vomiting or dysphagia.  Since her last OV, Nancy Herman has followed with Dr Loanne Drilling for hypothyroidism.  + "Acidity" sensation in throat.  Nancy Herman tried to decrease food intake and following some dietary recommendations. "Little" better with Omeprazole 20 mg. Changes in enviromant also seem to exacerbate cough, with temperature changes from cold to hot and vise versa. "Little" wheezing sometimes,Nancy Herman states that Nancy Herman had an inh before, not sure about name.  Nancy Herman is not taking OTC antihistaminic. Flonase nasal spray has caused nose irritation.  -Left cervical pain for months. Shoulder arthralgias, bilateral. Pain is exacerbated by some movement,  Pain is not radiated, denies UE numbness or tingling, no weakness. No recent trauma. Nancy Herman has not noted rash or limitation of ROM.   Review of Systems  Constitutional: Positive for fatigue. Negative for activity change, appetite change and fever.  HENT: Positive for congestion and rhinorrhea. Negative for mouth sores, nosebleeds, postnasal drip, trouble swallowing and voice change.   Eyes: Negative for pain, redness and visual disturbance.  Respiratory: Positive for cough. Negative for shortness of breath and wheezing.   Cardiovascular: Negative for chest pain, palpitations and leg swelling.  Gastrointestinal: Positive for nausea. Negative for abdominal pain, blood in stool and vomiting.       Negative for changes in bowel habits.  Genitourinary: Negative for decreased urine volume and hematuria.  Musculoskeletal: Positive for arthralgias and neck pain. Negative for gait problem.  Skin: Negative for rash.  Allergic/Immunologic: Positive for environmental  allergies.  Neurological: Negative for syncope, weakness and headaches.  Psychiatric/Behavioral: Positive for sleep disturbance. Negative for confusion. The patient is nervous/anxious.       Current Outpatient Prescriptions on File Prior to Visit  Medication Sig Dispense Refill  . famotidine (PEPCID) 20 MG tablet Take 20 mg by mouth daily. 1 tab twice daily    . FLUoxetine (PROZAC) 20 MG capsule Take 1 capsule (20 mg total) by mouth daily. 90 capsule 1  . levothyroxine (SYNTHROID, LEVOTHROID) 50 MCG tablet Take 1 tablet (50 mcg total) by mouth daily before breakfast. 90 tablet 3  . pravastatin (PRAVACHOL) 20 MG tablet Take 1 tablet (20 mg total) by mouth daily. 90 tablet 2  . Probiotic Product (PROBIOTIC-10 PO) Take by mouth.     No current facility-administered medications on file prior to visit.      Past Medical History:  Diagnosis Date  . Allergy   . Anxiety   . Dyslipidemia   . Frequent headaches   . GERD (gastroesophageal reflux disease)   . Hyperlipidemia   . IBS (irritable bowel syndrome)   . 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 Thyroid disease   . Urine incontinence    No Known Allergies  Social History   Social History  . Marital status: Single    Spouse name: N/A  . Number of children: N/A  . Years of education: N/A   Social History Main Topics  . Smoking status: Never Smoker  . Smokeless tobacco: Never Used  . Alcohol use No  . Drug use: No  . Sexual activity: No   Other Topics Concern  . None   Social History Narrative  .  None    Vitals:   03/04/16 0820  BP: 120/78  Pulse: 77  Resp: 12  Temp: 98.4 F (36.9 C)   Body mass index is 24.32 kg/m.   Wt Readings from Last 3 Encounters:  03/04/16 139 lb 8 oz (63.3 kg)  02/20/16 142 lb (64.4 kg)  02/08/16 142 lb 2 oz (64.5 kg)      Physical Exam  Nursing note and vitals reviewed. Constitutional: Nancy Herman is oriented to person, place, and time. Nancy Herman appears well-developed. No distress.  HENT:    Head: Atraumatic.  Mouth/Throat: Oropharynx is clear and moist and mucous membranes are normal.  Eyes: Conjunctivae and EOM are normal.  Cardiovascular: Normal rate and regular rhythm.   No murmur heard. Pulses:      Dorsalis pedis pulses are 2+ on the right side, and 2+ on the left side.  Respiratory: Effort normal and breath sounds normal. No respiratory distress.  GI: Soft. Nancy Herman exhibits no mass. There is no hepatomegaly. There is no tenderness.  Musculoskeletal: Nancy Herman exhibits no edema.       Right shoulder: Nancy Herman exhibits normal range of motion.       Cervical back: Nancy Herman exhibits decreased range of motion and tenderness. Nancy Herman exhibits no bony tenderness.  Cervical spine mild tenderness upon palpation of trapezium,left. Right shoulder ROM elicits pain.  Lymphadenopathy:    Nancy Herman has no cervical adenopathy.  Neurological: Nancy Herman is alert and oriented to person, place, and time. Nancy Herman has normal strength. Coordination and gait normal.  Skin: Skin is warm. No erythema.  Psychiatric: Her mood appears anxious.  Well groomed, good eye contact.      ASSESSMENT AND PLAN:    Nancy Herman was seen today for follow-up.  Diagnoses and all orders for this visit:  Cervicalgia  OTC topical treatment recommended: Icy hot with Lidocaine. ROM exercises. Instructed about warning signs. No Hx of recent trauma, so I do not think imaging is needed today. F/U as needed.  Gastroesophageal reflux disease, esophagitis presence not specified  GERD precautions to continue. Omeprazole increased from 20 mg qd to 20 mg bid. F/U in 3 months.  -     omeprazole (PRILOSEC) 20 MG capsule; Take 1 capsule (20 mg total) by mouth 2 (two) times daily before a meal.  Allergic rhinitis, unspecified chronicity, unspecified seasonality, unspecified trigger  OTC Allegra 180 mg daily. Astelin is not covered by her insurance. Atrovent nasal spray recommended. F/U in 3 months.  Persistent cough  ? GERD, allergy  related, RAD among some. Some improvement with PPI. Referral to pulmonologist recommended.  -     Ambulatory referral to Pulmonology      -Nancy Herman was advised to return sooner than planned today if new concerns arise.       Miquel Stacks G. Martinique, MD  [facility]. [place] office.

## 2016-03-04 NOTE — Progress Notes (Signed)
Pre visit review using our clinic review tool, if applicable. No additional management support is needed unless otherwise documented below in the visit note. 

## 2016-03-04 NOTE — Patient Instructions (Addendum)
A few things to remember from today's visit:   Gastroesophageal reflux disease, esophagitis presence not specified - Plan: omeprazole (PRILOSEC) 20 MG capsule  Allergic rhinitis, unspecified chronicity, unspecified seasonality, unspecified trigger - Plan: azelastine (ASTELIN) 0.1 % nasal spray  Cough - Plan: Ambulatory referral to Pulmonology Today Omeprazole increased. Recommend Allegra 180 mg daily.  Nasal spray,Astelin.  Icy Hot with Lidocaine on left trapezium and cervical area. Avoid weight lifting until recovered.  Continue GERD precautions.   Avoid foods that make your symptoms worse, for example coffee, chocolate,pepermeint,alcohol, and greasy food. Raising the head of your bed about 6 inches may help with nocturnal symptoms.   Avoid lying down for 3 hours after eating.  Instead 3 large meals daily try small and more frequent meals during the day.   You should be evaluated immediately if bloody vomiting, bloody stools, black stools (like tar), difficulty swallowing, food gets stuck on the way down or choking when eating. Abnormal weight loss or severe abdominal pain.    Please be sure medication list is accurate. If a new problem present, please set up appointment sooner than planned today.

## 2016-03-07 ENCOUNTER — Other Ambulatory Visit: Payer: Self-pay

## 2016-03-07 MED ORDER — IPRATROPIUM BROMIDE 0.03 % NA SOLN: NASAL | 3 refills | Status: DC

## 2016-03-08 ENCOUNTER — Ambulatory Visit: Payer: Medicare Other | Admitting: Family Medicine

## 2016-03-12 ENCOUNTER — Telehealth: Payer: Self-pay

## 2016-03-12 NOTE — Telephone Encounter (Signed)
Received PA request from insurance company for Omeprazole 20 mg capsules. PA submitted & is pending. Key: Nancy Herman

## 2016-03-12 NOTE — Telephone Encounter (Signed)
PA approved, form faxed back to pharmacy. 

## 2016-03-28 ENCOUNTER — Ambulatory Visit (INDEPENDENT_AMBULATORY_CARE_PROVIDER_SITE_OTHER)
Admission: RE | Admit: 2016-03-28 | Discharge: 2016-03-28 | Disposition: A | Discharge status: Home / Self Care | Payer: Medicare HMO | Source: Ambulatory Visit | Attending: Pulmonary Disease | Admitting: Pulmonary Disease

## 2016-03-28 ENCOUNTER — Ambulatory Visit (INDEPENDENT_AMBULATORY_CARE_PROVIDER_SITE_OTHER): Payer: Medicare HMO | Admitting: Pulmonary Disease

## 2016-03-28 ENCOUNTER — Encounter: Payer: Self-pay | Admitting: Pulmonary Disease

## 2016-03-28 VITALS — BP 124/64 | HR 77 | Ht 63.0 in | Wt 143.2 lb

## 2016-03-28 DIAGNOSIS — R059 Cough, unspecified: Secondary | ICD-10-CM

## 2016-03-28 DIAGNOSIS — K219 Gastro-esophageal reflux disease without esophagitis: Secondary | ICD-10-CM

## 2016-03-28 DIAGNOSIS — R05 Cough: Secondary | ICD-10-CM

## 2016-03-28 DIAGNOSIS — J309 Allergic rhinitis, unspecified: Secondary | ICD-10-CM | POA: Diagnosis not present

## 2016-03-28 MED ORDER — BENZONATATE 200 MG PO CAPS: 200.0000 mg | ORAL_CAPSULE | Freq: Three times a day (TID) | ORAL | 1 refills | Status: DC | PRN

## 2016-03-28 NOTE — Assessment & Plan Note (Signed)
She has not been compliant with timing her meals appropriately or taking her medication. However she seems to have a good understanding of the appropriate regimen recommended by her primary care physician.  Plan: I reiterated the importance of compliance with her antacid therapy as well as eating meals no sooner than 3 hours before bedtime.

## 2016-03-28 NOTE — Progress Notes (Signed)
Subjective:    Patient ID: Nancy Herman, female    DOB: 12-24-1945, 71 y.o.   MRN: [id number]  HPI Chief Complaint  Patient presents with  . Advice Only    per Dr. Martinique for cough. pt c/o lingering prod cough with clear mucus, occ wheezing, occ sob with exertion, occ chest tightness & occ postnasal drip X 23mo.    Cough: > she has seen her doctor for this and was prescribed a cough medicine > she says that she has been having allergy symptoms lately > she has been given a nebulizer recently  > when she gets cold she will have a lot of chest congestion and mucus production > she coughs up mucus right now.   > she think that acid relux is making it worse > she feels excess saliva during the night when lying flat > she hasn't been feeling a lot of sinus pain or congestion, can't tell if she has post nasal drip > she notes a headache  > she feels a slight shortness of breath, sometimes this is worse   She has never had childhood respiratory illnesses.  She was born prematurely, doesn't recall associated lung disease.  No family history of lung problems.  Her mother had sickle cell.  Her father had coronary artery disease.   GERD > this has been bad lately > she is taking pepcid and prilosec but still has heartburn, but she is only taking the pepcid once perday > she notes that she often eats not long before bedtime  She notes allergies: > runny nose > burning eyes > sneezing > has not been too bad lately > some irritation in her throat like dry throat when she goes outside  She never smoked.  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     Past Medical History:  Diagnosis Date  . Allergy   . Anxiety   . Dyslipidemia   . Frequent headaches   . GERD (gastroesophageal reflux disease)   . Hyperlipidemia   . IBS (irritable bowel syndrome)   . 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  Thyroid disease   . Urine incontinence      Family History  Problem Relation Age of Onset  . Heart disease Mother   . Alcohol abuse Mother   . Drug abuse Mother   . Heart disease Father   . Drug abuse Father   . Alcohol abuse Father      Social History   Social History  . Marital status: Single    Spouse name: N/A  . Number of children: N/A  . Years of education: N/A   Occupational History  . Not on file.   Social History Main Topics  . Smoking status: Never Smoker  . Smokeless tobacco: Never Used  . Alcohol use No  . Drug use: No  . Sexual activity: No   Other Topics Concern  . Not on file   Social History Narrative  . No narrative on file     No Known Allergies   Outpatient Medications Prior to Visit  Medication Sig Dispense Refill  . famotidine (PEPCID) 20 MG tablet Take 20 mg by mouth daily. 1 tab twice daily    . FLUoxetine (PROZAC) 20 MG capsule Take 1 capsule (20 mg total) by mouth daily. 90 capsule 1  . ipratropium (ATROVENT) 0.03 % nasal spray 2 sprays in  each nostril twice a day. 30 mL 3  . levothyroxine (SYNTHROID, LEVOTHROID) 50 MCG tablet Take 1 tablet (50 mcg total) by mouth daily before breakfast. 90 tablet 3  . omeprazole (PRILOSEC) 20 MG capsule Take 1 capsule (20 mg total) by mouth 2 (two) times daily before a meal. 60 capsule 1  . pravastatin (PRAVACHOL) 20 MG tablet Take 1 tablet (20 mg total) by mouth daily. 90 tablet 2  . Probiotic Product (PROBIOTIC-10 PO) Take by mouth.     No facility-administered medications prior to visit.       Review of Systems  Constitutional: Negative for fever and unexpected weight change.  HENT: Positive for congestion, ear pain and postnasal drip. Negative for dental problem, nosebleeds, rhinorrhea, sinus pressure, sneezing, sore throat and trouble swallowing.   Eyes: Negative for redness and itching.  Respiratory: Positive for cough, chest tightness, shortness of breath and wheezing.   Cardiovascular:  Negative for palpitations and leg swelling.  Gastrointestinal: Negative for nausea and vomiting.  Genitourinary: Negative for dysuria.  Musculoskeletal: Negative for joint swelling.  Skin: Negative for rash.  Neurological: Negative for headaches.  Hematological: Bruises/bleeds easily.  Psychiatric/Behavioral: Negative for dysphoric mood. The patient is nervous/anxious.        Objective:   Physical Exam  Vitals:   03/28/16 1534  BP: 124/64  Pulse: 77  SpO2: 98%  Weight: 143 lb 3.2 oz (65 kg)  Height: 5\' 3"  (1.6 m)   Gen: well appearing, no acute distress HENT: NCAT, OP clear, neck supple without masses Eyes: PERRL, EOMi Lymph: no cervical lymphadenopathy PULM: CTA B CV: RRR, no mgr, no JVD GI: BS+, soft, nontender, no hsm Derm: no rash or skin breakdown MSK: normal bulk and tone Neuro: A&Ox4, CN II-XII intact, strength 5/5 in all 4 extremities Psyche: normal mood and affect   Records from January 2018 primary care visit reviewed were she was treated for acid reflux. She was referred to me for cough.    Assessment & Plan:  Cough She has a persistent cough in the setting of poorly controlled acid reflux and allergic rhinitis. In addition to these problems I believe that ongoing laryngeal irritation is contributing to an perpetuating the cough. 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  See allergic rhinitis and acid reflux below.  Plan: Voice rest was strongly encouraged Tessalon Perles prescribed  GERD (gastroesophageal reflux disease) She has not been compliant with timing her meals appropriately or taking her medication. However she seems to have a good understanding of the appropriate regimen recommended by her primary care physician.  Plan: I reiterated the importance of compliance with her antacid therapy as well as eating meals no sooner than 3 hours before bedtime.  Allergic rhinitis Poorly controlled recently and contributing to  cough.  Plan: Zyrtec Flonase    Current Outpatient Prescriptions:  .  famotidine (PEPCID) 20 MG tablet, Take 20 mg by mouth daily. 1 tab twice daily, Disp: , Rfl:  .  FLUoxetine (PROZAC) 20 MG capsule, Take 1 capsule (20 mg total) by mouth daily., Disp: 90 capsule, Rfl: 1 .  ipratropium (ATROVENT) 0.03 % nasal spray, 2 sprays in each nostril twice a day., Disp: 30 mL, Rfl: 3 .  levothyroxine (SYNTHROID, LEVOTHROID) 50 MCG tablet, Take 1 tablet (50 mcg total) by mouth daily before breakfast., Disp: 90 tablet, Rfl: 3 .  omeprazole (PRILOSEC) 20 MG capsule, Take 1 capsule (20 mg total) by mouth 2 (two) times daily before a meal., Disp: 60  capsule, Rfl: 1 .  pravastatin (PRAVACHOL) 20 MG tablet, Take 1 tablet (20 mg total) by mouth daily., Disp: 90 tablet, Rfl: 2 .  Probiotic Product (PROBIOTIC-10 PO), Take by mouth., Disp: , Rfl:  .  benzonatate (TESSALON) 200 MG capsule, Take 1 capsule (200 mg total) by mouth 3 (three) times daily as needed for cough., Disp: 45 capsule, Rfl: 1

## 2016-03-28 NOTE — Assessment & Plan Note (Signed)
Poorly controlled recently and contributing to cough.  Plan: Zyrtec Flonase

## 2016-03-28 NOTE — Patient Instructions (Signed)
For your allergic rhinitis: Use Neil Med rinses with distilled water at least twice per day using the instructions on the package. 1/2 hour after using the [facility] Med rinse, use Nasacort two puffs in each nostril once per day.  Remember that the Nasacort can take 1-2 weeks to work after regular use. Use generic zyrtec (cetirizine) every day.  If this doesn't help, then stop taking it and use chlorpheniramine-phenylephrine combination tablets.  For your GERD: Keep taking the prilosec and pepcid prescribed by your PCP  For your cough: We will order a CXR and call you with the results Take tessalon 200mg  po three times a day as needed for cough You need to try to suppress your cough to allow your larynx (voice box) to heal.  For three days don't talk, laugh, sing, or clear your throat. Do everything you can to suppress the cough during this time. Use hard candies (sugarless Jolly Ranchers) or non-mint or non-menthol containing cough drops during this time to soothe your throat.  Use a cough suppressant (Delsym or what I have prescribed you) around the clock during this time.  After three days, gradually increase the use of your voice and back off on the cough suppressants.  We will see you back in 4 weeks with a nurse practitioner or sooner if needed

## 2016-03-28 NOTE — Assessment & Plan Note (Signed)
She has a persistent cough in the setting of poorly controlled acid reflux and allergic rhinitis. In addition to these problems I believe that ongoing laryngeal irritation is contributing to an perpetuating the cough. 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  See allergic rhinitis and acid reflux below.  Plan: Voice rest was strongly encouraged Tessalon Perles prescribed

## 2016-04-18 DIAGNOSIS — H40013 Open angle with borderline findings, low risk, bilateral: Secondary | ICD-10-CM | POA: Insufficient documentation

## 2016-04-29 ENCOUNTER — Encounter: Payer: Self-pay | Admitting: Adult Health

## 2016-04-29 ENCOUNTER — Ambulatory Visit (INDEPENDENT_AMBULATORY_CARE_PROVIDER_SITE_OTHER): Payer: Medicare HMO | Admitting: Adult Health

## 2016-04-29 DIAGNOSIS — R05 Cough: Secondary | ICD-10-CM

## 2016-04-29 DIAGNOSIS — R059 Cough, unspecified: Secondary | ICD-10-CM

## 2016-04-29 NOTE — Patient Instructions (Addendum)
Begin Zyrtec 10mg  At bedtime  For drainage.  Bregin Pepcid 20mg  At bedtime   Continue on Prilosec 20mg  daily .  Saline nasal rinses daily Begin Flonase 2 puffs each nare daily .  Use Tessalon As needed  Cough .  Follow up Dr. Lake Bells in 2 months and As needed   Please contact office for sooner follow up if symptoms do not improve or worsen or seek emergency care

## 2016-04-29 NOTE — Progress Notes (Signed)
@Patient  ID: Nancy Herman, female    DOB: [birth year], 71 y.o.   MRN: [id number]  Chief Complaint  Patient presents with  . Follow-up    cough    Referring provider: Martinique, Betty G, MD  HPI: 71 yo never smoker seen for pulmonary consult 03/28/16 for cough   04/29/2016 Follow up : Cough  Pt returns for a 1 month follow up . She was seen last ov for pulmonary consult for cough .  She was treated for upper airway cough syndrome with voice rest , tessalon, antacid , zyrtec and flonase. She used tessalon . She did not take pepcid, zyrtec or flonase .  CXR was clear except for BB scarring .  She says this regimen worked with cough resolution until 3 days ago . Cough is starting to come back some.  She denies chest pain, discolored mcus or fever.  Cough is mainly dry. Does have some drainage .      No Known Allergies  Immunization History  Administered Date(s) Administered  . Influenza, High Dose Seasonal PF 11/06/2015    Past Medical History:  Diagnosis Date  . Allergy   . Anxiety   . Dyslipidemia   . Frequent headaches   . GERD (gastroesophageal reflux disease)   . Hyperlipidemia   . IBS (irritable bowel syndrome)   . 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 Thyroid disease   . Urine incontinence     Tobacco History: History  Smoking Status  . Never Smoker  Smokeless Tobacco  . Never Used   Counseling given: Not Answered   Outpatient Encounter Prescriptions as of 04/29/2016  Medication Sig  . benzonatate (TESSALON) 200 MG capsule Take 1 capsule (200 mg total) by mouth 3 (three) times daily as needed for cough.  . famotidine (PEPCID) 20 MG tablet Take 20 mg by mouth daily. 1 tab twice daily  . FLUoxetine (PROZAC) 20 MG capsule Take 1 capsule (20 mg total) by mouth daily.  Marland Kitchen ipratropium (ATROVENT) 0.03 % nasal spray 2 sprays in each nostril twice a day.  . levothyroxine (SYNTHROID, LEVOTHROID) 50 MCG tablet Take 1 tablet (50 mcg total) by mouth daily before  breakfast.  . omeprazole (PRILOSEC) 20 MG capsule Take 1 capsule (20 mg total) by mouth 2 (two) times daily before a meal.  . pravastatin (PRAVACHOL) 20 MG tablet Take 1 tablet (20 mg total) by mouth daily.  . Probiotic Product (PROBIOTIC-10 PO) Take by mouth.   No facility-administered encounter medications on file as of 04/29/2016.      Review of Systems  Constitutional:   No  weight loss, night sweats,  Fevers, chills, fatigue, or  lassitude.  HEENT:   No headaches,  Difficulty swallowing,  Tooth/dental problems, or  Sore throat,                No sneezing, itching, ear ache,  +nasal congestion, post nasal drip,   CV:  No chest pain,  Orthopnea, PND, swelling in lower extremities, anasarca, dizziness, palpitations, syncope.   GI  No heartburn, indigestion, abdominal pain, nausea, vomiting, diarrhea, change in bowel habits, loss of appetite, bloody stools.   Resp:  .  No chest wall deformity  Skin: no rash or lesions.  GU: no dysuria, change in color of urine, no urgency or frequency.  No flank pain, no hematuria   MS:  No joint pain or swelling.  No decreased range of motion.  No back pain.    Physical Exam  BP  102/64 (BP Location: Left Arm, Cuff Size: Normal)   Pulse 73   Ht 5\' 3"  (1.6 m)   Wt 143 lb 6.4 oz (65 kg)   LMP  (LMP Unknown)   SpO2 98%   BMI 25.40 kg/m   GEN: A/Ox3; pleasant , NAD, elderly    HEENT:  [place]/AT,  EACs-clear, TMs-wnl, NOSE-clear drainage , THROAT-clear, no lesions, no postnasal drip or exudate noted.   NECK:  Supple w/ fair ROM; no JVD; normal carotid impulses w/o bruits; no thyromegaly or nodules palpated; no lymphadenopathy.    RESP  Clear  P & A; w/o, wheezes/ rales/ or rhonchi. no accessory muscle use, no dullness to percussion  CARD:  RRR, no m/r/g, no peripheral edema, pulses intact, no cyanosis or clubbing.  GI:   Soft & nt; nml bowel sounds; no organomegaly or masses detected.   Musco: Warm bil, no deformities or joint swelling  noted.   Neuro: alert, no focal deficits noted.    Skin: Warm, no lesions or rashes    Lab Results:  CBC No results found for: WBC, RBC, HGB, HCT, PLT, MCV, MCH, MCHC, RDW, LYMPHSABS, MONOABS, EOSABS, BASOSABS  BMET    Component Value Date/Time   NA 140 12/26/2015 1328   K 4.0 12/26/2015 1328   CL 104 12/26/2015 1328   CO2 28 12/26/2015 1328   GLUCOSE 90 12/26/2015 1328   BUN 11 12/26/2015 1328   CREATININE 0.97 12/26/2015 1328   CALCIUM 9.5 12/26/2015 1328    BNP No results found for: BNP  ProBNP No results found for: PROBNP  Imaging: No results found.   Assessment & Plan:   Cough Upper airway cough w/ suspected rhinitis /GERD as trigger Some improvement but needs to stick with regimen to prevent triggers   Plan  Patient Instructions  Begin Zyrtec 10mg  At bedtime  For drainage.  Bregin Pepcid 20mg  At bedtime   Continue on Prilosec 20mg  daily .  Saline nasal rinses daily Begin Flonase 2 puffs each nare daily .  Use Tessalon As needed  Cough .  Follow up Dr. Lake Bells in 2 months and As needed   Please contact office for sooner follow up if symptoms do not improve or worsen or seek emergency care         Rexene Edison, NP 04/29/2016

## 2016-04-29 NOTE — Assessment & Plan Note (Signed)
Upper airway cough w/ suspected rhinitis /GERD as trigger Some improvement but needs to stick with regimen to prevent triggers   Plan  Patient Instructions  Begin Zyrtec 10mg  At bedtime  For drainage.  Bregin Pepcid 20mg  At bedtime   Continue on Prilosec 20mg  daily .  Saline nasal rinses daily Begin Flonase 2 puffs each nare daily .  Use Tessalon As needed  Cough .  Follow up Dr. Lake Bells in 2 months and As needed   Please contact office for sooner follow up if symptoms do not improve or worsen or seek emergency care

## 2016-04-29 NOTE — Progress Notes (Signed)
Reviewed, agree 

## 2016-05-21 ENCOUNTER — Encounter: Payer: Self-pay | Admitting: Family Medicine

## 2016-05-21 DIAGNOSIS — F32A Depression, unspecified: Secondary | ICD-10-CM | POA: Insufficient documentation

## 2016-05-21 DIAGNOSIS — F329 Major depressive disorder, single episode, unspecified: Secondary | ICD-10-CM | POA: Insufficient documentation

## 2016-06-02 NOTE — Progress Notes (Signed)
HPI:   [name] is a 71 y.o. female, who is here today to follow on some medical problems.  Last OV 03/04/16, she was referred to pulmonologist for chronic cough.  Last OV she was also c/o left cervical pain , improved, "not as bad" now.   GERD: Omeprazole 20 mg bid was recommended last OV. She did not increase dose as recommended.  She was having persistent cough as well as reflux.   She has had some symptoms since her last OV, dysphagia one time, feel like food got stuck in mid of her chest and burping.  Cough is "much better" , bother by flatus with coughing with some rectal mucous noted on underwear. According to pt,this is worse after having "big" bowel movements. She denies stool incontinence.   IBS-C: Miralax helps but she does not take it frequently.  Daily bowel movements , she has small stools bid, does not feel satisfied , sometimes stool is hard but fiber helps with consistently. Occasionally cramps that improves with defecation, very mild and infrequent. Denies nausea, vomiting, changes in bowel habits, blood in stool or melena. Colonoscopy 03/2015 negative,per pt report.  Anxiety:  She is currently on Prozac 20 mg, started in 10/2015. She denies suicidal thoughts. Sometimes "sadness" but in general she feels like medication has helped greatly. She denies side effects.  Insomnia:  Sleeping "most of the time good", about 7 hours. She has improved sleep hygiene, she goes to bed between 9-10 pm even thought she feels sleepy earlier. If she goes to bed before 9 pm she will wake up earlier and will not be able to go back to sleep.  HLD:  She is on Pravastatin 20 mg daily. She also follows low fat diet. Tolerating medication well,no side effects.   Lab Results  Component Value Date   CHOL 200 10/23/2015   HDL 82.00 10/23/2015   LDLCALC 104 (H) 10/23/2015   TRIG 71.0 10/23/2015   CHOLHDL 2 10/23/2015    Today she is c/o cramps on  calves, she thinks it is related to varicose vein. She has had it for a while, seemed more frequent when she was working and associated with prolonged walking. Stiffness sensation in the morning, no erythema.  Cramps also worse first thing in the morning or movement after prolonged rest, improves with movement. Also noted cramps right after yoga class but if she misses classes she gets cramps more frequent during the day. "Heavy" feeling on distal LE.  She has compression stocking but doe snot wear them.  Knee pain, L>R,achy and stiffness.Exacerbated by squatting and going up/down stairs.Alleviated by rest.  No recent trauma.   - Rhinorrhea and post nasal drainage,both seem worse in the morning. + Nose pruritus. She denies fever,chills,or sick contact. She has used Flonase nasal spray as needed.   Review of Systems  Constitutional: Negative for activity change, appetite change, fatigue, fever and unexpected weight change.  HENT: Positive for postnasal drip and rhinorrhea. Negative for mouth sores, nosebleeds and trouble swallowing.   Eyes: Negative for redness and visual disturbance.  Respiratory: Positive for cough. Negative for shortness of breath and wheezing.   Cardiovascular: Negative for chest pain, palpitations and leg swelling.  Gastrointestinal: Positive for constipation. Negative for blood in stool, nausea and vomiting.       Negative for changes in bowel habits.  Endocrine: Negative for cold intolerance and heat intolerance.  Genitourinary: Negative for decreased urine volume, dysuria and hematuria.  Musculoskeletal:  Positive for arthralgias and myalgias. Negative for gait problem.  Skin: Negative for rash and wound.  Neurological: Negative for syncope, weakness and headaches.  Psychiatric/Behavioral: Negative for confusion. The patient is nervous/anxious.       Current Outpatient Prescriptions on File Prior to Visit  Medication Sig Dispense Refill  . famotidine  (PEPCID) 20 MG tablet Take 20 mg by mouth daily. 1 tab twice daily    . FLUoxetine (PROZAC) 20 MG capsule Take 1 capsule (20 mg total) by mouth daily. 90 capsule 1  . ipratropium (ATROVENT) 0.03 % nasal spray 2 sprays in each nostril twice a day. 30 mL 3  . levothyroxine (SYNTHROID, LEVOTHROID) 50 MCG tablet Take 1 tablet (50 mcg total) by mouth daily before breakfast. 90 tablet 3  . Probiotic Product (PROBIOTIC-10 PO) Take by mouth.     No current facility-administered medications on file prior to visit.      Past Medical History:  Diagnosis Date  . Allergy   . Anxiety   . Dyslipidemia   . Frequent headaches   . GERD (gastroesophageal reflux disease)   . Hyperlipidemia   . IBS (irritable bowel syndrome)   . 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 Thyroid disease   . Urine incontinence    No Known Allergies  Social History   Social History  . Marital status: Single    Spouse name: N/A  . Number of children: N/A  . Years of education: N/A   Social History Main Topics  . Smoking status: Never Smoker  . Smokeless tobacco: Never Used  . Alcohol use No  . Drug use: No  . Sexual activity: No   Other Topics Concern  . None   Social History Narrative  . None    Vitals:   06/03/16 0842  BP: 128/80  Pulse: 82  Resp: 12  O2 sat at RA 98%. Body mass index is 25.07 kg/m.   Physical Exam  Nursing note and vitals reviewed. Constitutional: She is oriented to person, place, and time. She appears well-developed and well-nourished. No distress.  HENT:  Head: Atraumatic.  Nose: Rhinorrhea present.  Mouth/Throat: Oropharynx is clear and moist and mucous membranes are normal.  Post nasal drainage. Clearing her throat frequently.   Eyes: Conjunctivae and EOM are normal.  Cardiovascular: Normal rate and regular rhythm.   No murmur heard. Pulses:      Dorsalis pedis pulses are 2+ on the right side, and 2+ on the left side.  Varicose veins and telangiectasis LE bilateral.     Respiratory: Effort normal and breath sounds normal. No respiratory distress.  GI: Soft. She exhibits no mass. There is no hepatomegaly. There is no tenderness.  Musculoskeletal: She exhibits no edema.       Cervical back: She exhibits no tenderness and no bony tenderness.  Left knee pain upon palpation of lateral inter articular line. Mild bilateral crepitus,does not elicit pain.  Lymphadenopathy:    She has no cervical adenopathy.  Neurological: She is alert and oriented to person, place, and time. She has normal strength. Gait normal.  Skin: Skin is warm. No rash noted. No erythema.  Psychiatric: Her speech is normal. Her mood appears anxious.  Well groomed, good eye contact.      ASSESSMENT AND PLAN:  Simya was seen today for follow-up.  Diagnoses and all orders for this visit:  Gastroesophageal reflux disease, esophagitis presence not specified  GERD precautions discussed. Omeprazole increased from qd to bid before breakfast and supper. F/U  in 3-4 months.  -     omeprazole (PRILOSEC) 20 MG capsule; Take 1 capsule (20 mg total) by mouth 2 (two) times daily before a meal.  Generalized anxiety disorder  Stable. No changes in current management. F/U in 3-56months.  Hyperlipidemia, unspecified hyperlipidemia type  Pravastatin decreased from 20 mg to 10 mg. Will repeat FLP in 3-4 months. Continue low fat diet.   -     pravastatin (PRAVACHOL) 10 MG tablet; Take 1 tablet (10 mg total) by mouth daily.  Irritable bowel syndrome with constipation  Daily Miralax as needed. Increased fiber and fluid intake. Instructed about warning signs. F/U in 3-4 months.  Bilateral leg cramps  Possible etiologies dicussed, including vein disease and medications. Pravastatin decreased. Stretching exercises recommended, continue yoga, and wear compression stocking.   Allergic rhinitis, unspecified seasonality, unspecified trigger  OTC Allegra 180 mg and nasal irrigations  with saline. Continue Flonase nasal spray. F/U as needed.  Primary osteoarthritis of both knees  Tylenol as needed, 650 mg tid. Avoid activities that trigger pain. Topical OTC Icy hot with lidocaine may help. F/U as needed.     -Ms. GAVRIELLE STRECK was advised to return sooner than planned today if new concerns arise.       Eleanna Theilen G. Martinique, MD  [facility]. [place] office.

## 2016-06-03 ENCOUNTER — Encounter: Payer: Self-pay | Admitting: Family Medicine

## 2016-06-03 ENCOUNTER — Ambulatory Visit (INDEPENDENT_AMBULATORY_CARE_PROVIDER_SITE_OTHER): Payer: Medicare HMO | Admitting: Family Medicine

## 2016-06-03 VITALS — BP 128/80 | HR 82 | Resp 12 | Ht 63.0 in | Wt 141.5 lb

## 2016-06-03 DIAGNOSIS — F411 Generalized anxiety disorder: Secondary | ICD-10-CM | POA: Diagnosis not present

## 2016-06-03 DIAGNOSIS — R252 Cramp and spasm: Secondary | ICD-10-CM

## 2016-06-03 DIAGNOSIS — E785 Hyperlipidemia, unspecified: Secondary | ICD-10-CM | POA: Diagnosis not present

## 2016-06-03 DIAGNOSIS — K581 Irritable bowel syndrome with constipation: Secondary | ICD-10-CM | POA: Diagnosis not present

## 2016-06-03 DIAGNOSIS — M170 Bilateral primary osteoarthritis of knee: Secondary | ICD-10-CM | POA: Insufficient documentation

## 2016-06-03 DIAGNOSIS — J309 Allergic rhinitis, unspecified: Secondary | ICD-10-CM | POA: Diagnosis not present

## 2016-06-03 DIAGNOSIS — K219 Gastro-esophageal reflux disease without esophagitis: Secondary | ICD-10-CM

## 2016-06-03 MED ORDER — OMEPRAZOLE 20 MG PO CPDR: 20.0000 mg | DELAYED_RELEASE_CAPSULE | Freq: Two times a day (BID) | ORAL | 2 refills | Status: DC

## 2016-06-03 MED ORDER — PRAVASTATIN SODIUM 10 MG PO TABS: 10.0000 mg | ORAL_TABLET | Freq: Every day | ORAL | 1 refills | Status: DC

## 2016-06-03 NOTE — Patient Instructions (Signed)
A few things to remember from today's visit:   Generalized anxiety disorder  Gastroesophageal reflux disease, esophagitis presence not specified - Plan: omeprazole (PRILOSEC) 20 MG capsule  Hyperlipidemia, unspecified hyperlipidemia type - Plan: pravastatin (PRAVACHOL) 10 MG tablet  Irritable bowel syndrome with constipation  Bilateral leg cramps  Allergic rhinitis, unspecified seasonality, unspecified trigger  Allegra 180 mg daily.  Flonase as needed. Nasal irrigations as needed.   Increase Omeprazole to 2 times daily. Decrease Pravastatin. Compression stockings.   Avoid foods that make your symptoms worse, for example coffee, chocolate,pepermeint,alcohol, and greasy food. Raising the head of your bed about 6 inches may help with nocturnal symptoms.  Avoid tobacco use. Weight loss (if you are overweight). Avoid lying down for 3 hours after eating.  Instead 3 large meals daily try small and more frequent meals during the day.  Every medication have side effects and medications for GERD are not the exception.At this time I think benefit is greater than risk.  creases risk of pneumonia.  You should be evaluated immediately if bloody vomiting, bloody stools, black stools (like tar), difficulty swallowing, food gets stuck on the way down or choking when eating. Abnormal weight loss or severe abdominal pain.  If symptoms are not resolved sometimes endoscopy is necessary.  Please be sure medication list is accurate. If a new problem present, please set up appointment sooner than planned today.

## 2016-06-03 NOTE — Progress Notes (Signed)
Pre visit review using our clinic review tool, if applicable. No additional management support is needed unless otherwise documented below in the visit note. 

## 2016-06-25 ENCOUNTER — Ambulatory Visit: Payer: Medicare Other | Admitting: Endocrinology

## 2016-07-02 ENCOUNTER — Telehealth: Payer: Self-pay | Admitting: Family Medicine

## 2016-07-02 DIAGNOSIS — Z789 Other specified health status: Secondary | ICD-10-CM

## 2016-07-02 NOTE — Telephone Encounter (Signed)
Patient Name: Nancy Herman DOB: 08-27-1945 Initial Comment Caller states that she has irritable bowel syndrome and wants to know if it would be okay to take IBGard Nurse Assessment Nurse: Sherrell Puller, RN, Amy Date/Time Eilene Ghazi Time): 07/02/2016 11:32:25 AM Confirm and document reason for call. If symptomatic, describe symptoms. ---Caller states she is wondering if it's okay to take IBGuard with her other medicine? She is having no new symptoms today and has no other questions. This nurse asked if patient has a GI doctor, she said she has seen a GI doctor one time. She was advised to call and ask them but that a message would also be sent to doctor Martinique as well and someone would be calling her back. Patient verbalized understanding and has no further questions. Does the patient have any new or worsening symptoms? ---No Guidelines Guideline Title Affirmed Question Affirmed Notes Final Disposition User Comments

## 2016-07-02 NOTE — Telephone Encounter (Signed)
Pt would like to know if ok to take IBGuard with her other medications.   Dr. Martinique - Please advise. Thanks!

## 2016-07-03 ENCOUNTER — Ambulatory Visit: Payer: Medicare HMO | Admitting: Pulmonary Disease

## 2016-07-04 NOTE — Telephone Encounter (Signed)
Spoke with pt and advised. She would also like to have a referral to [facility] for varicose vein treatments.   Dr. Martinique - Please advise. Thanks!

## 2016-07-04 NOTE — Telephone Encounter (Signed)
Referral placed.

## 2016-07-04 NOTE — Telephone Encounter (Signed)
I think it is ok to take this OTC medication. Because this medication goes through the liver it could potentially increase the level of other medications like Pravastatin.But she is taking a low dose of Pravastatin ,so I do not think she would have a problem.  Thanks, BJ

## 2016-07-04 NOTE — Telephone Encounter (Signed)
Ok to place referral "per pt request"  Thanks, BJ

## 2016-07-04 NOTE — Telephone Encounter (Signed)
LMTCB

## 2016-07-11 ENCOUNTER — Other Ambulatory Visit: Payer: Self-pay | Admitting: *Deleted

## 2016-07-11 DIAGNOSIS — I83899 Varicose veins of unspecified lower extremities with other complications: Secondary | ICD-10-CM

## 2016-07-15 ENCOUNTER — Other Ambulatory Visit: Payer: Self-pay | Admitting: Family Medicine

## 2016-07-15 DIAGNOSIS — Z1231 Encounter for screening mammogram for malignant neoplasm of breast: Secondary | ICD-10-CM

## 2016-07-16 ENCOUNTER — Ambulatory Visit (INDEPENDENT_AMBULATORY_CARE_PROVIDER_SITE_OTHER): Payer: Medicare HMO | Admitting: Family Medicine

## 2016-07-16 ENCOUNTER — Encounter: Payer: Self-pay | Admitting: Family Medicine

## 2016-07-16 VITALS — BP 110/80 | HR 73 | Resp 12 | Ht 63.0 in | Wt 146.1 lb

## 2016-07-16 DIAGNOSIS — R1310 Dysphagia, unspecified: Secondary | ICD-10-CM

## 2016-07-16 DIAGNOSIS — K219 Gastro-esophageal reflux disease without esophagitis: Secondary | ICD-10-CM

## 2016-07-16 DIAGNOSIS — R059 Cough, unspecified: Secondary | ICD-10-CM

## 2016-07-16 DIAGNOSIS — R05 Cough: Secondary | ICD-10-CM

## 2016-07-16 MED ORDER — ESOMEPRAZOLE MAGNESIUM 20 MG PO CPDR: 20.0000 mg | DELAYED_RELEASE_CAPSULE | Freq: Two times a day (BID) | ORAL | 2 refills | Status: DC

## 2016-07-16 NOTE — Progress Notes (Signed)
HPI:   ACUTE VISIT:  No chief complaint on file.   [name] is a 71 y.o. female, who is here today complaining of food getting "stuck" in upper throat for the past 2 weeks. She has Hx of GERD, symptoms have improved with low dose PPI and dietary changes. She is currently on Omeprazole 20 mg daily.  For the past few weeks she has been eating more than usual, mainly at night. She feels like she is "never" full and needs to eat all the time. She has noted some wt loss since symptoms started. + Frequent burping and heartburn.  Dysphagia is intermittent and usually with solids, mainly toast. Mild odynophagia on right side of her throat. + Non productive cough is back, Hx of chronic cough. She denies wheezing, dyspnea, or chest pain.    GI Problem  Primary symptoms do not include fever, fatigue, abdominal pain, nausea, vomiting, myalgias or rash. The onset was gradual. The problem has not changed since onset. The illness is also significant for dysphagia. The illness does not include chills, anorexia or bloating. Significant associated medical issues include GERD and irritable bowel syndrome. Associated medical issues do not include liver disease or alcohol abuse.   Symptoms alleviated by eating slower and chew well. She has also tried Tums. She also feels better after burping.  EGD done years ago.  Denies abdominal pain, nausea, vomiting, changes in bowel habits, blood in stool or melena.  Hx of allergic rhinitis, she has post nasal drainage. She is currently on nasal Atrovent.  No Hx of tobacco use.   Review of Systems  Constitutional: Negative for activity change, chills, fatigue and fever.  HENT: Positive for postnasal drip, sore throat and trouble swallowing. Negative for facial swelling, mouth sores, nosebleeds and voice change.   Respiratory: Positive for cough. Negative for shortness of breath and wheezing.   Cardiovascular: Negative for chest pain,  palpitations and leg swelling.  Gastrointestinal: Positive for dysphagia. Negative for abdominal pain, anorexia, bloating, blood in stool, nausea and vomiting.       No changes in bowel habits.  Endocrine: Positive for polyphagia. Negative for cold intolerance, heat intolerance, polydipsia and polyuria.  Genitourinary: Negative for decreased urine volume and hematuria.  Musculoskeletal: Negative for gait problem and myalgias.  Skin: Negative for rash.  Allergic/Immunologic: Positive for environmental allergies.  Neurological: Negative for syncope, weakness and headaches.  Psychiatric/Behavioral: Negative for confusion and sleep disturbance. The patient is nervous/anxious.       Current Outpatient Prescriptions on File Prior to Visit  Medication Sig Dispense Refill  . famotidine (PEPCID) 20 MG tablet Take 20 mg by mouth daily. 1 tab twice daily    . FLUoxetine (PROZAC) 20 MG capsule Take 1 capsule (20 mg total) by mouth daily. 90 capsule 1  . ipratropium (ATROVENT) 0.03 % nasal spray 2 sprays in each nostril twice a day. 30 mL 3  . levothyroxine (SYNTHROID, LEVOTHROID) 50 MCG tablet Take 1 tablet (50 mcg total) by mouth daily before breakfast. 90 tablet 3  . pravastatin (PRAVACHOL) 10 MG tablet Take 1 tablet (10 mg total) by mouth daily. 90 tablet 1  . Probiotic Product (PROBIOTIC-10 PO) Take by mouth.     No current facility-administered medications on file prior to visit.      Past Medical History:  Diagnosis Date  . Allergy   . Anxiety   . Dyslipidemia   . Frequent headaches   . GERD (gastroesophageal reflux disease)   .  Hyperlipidemia   . IBS (irritable bowel syndrome)   . 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 Thyroid disease   . Urine incontinence    No Known Allergies  Social History   Social History  . Marital status: Single    Spouse name: N/A  . Number of children: N/A  . Years of education: N/A   Social History Main Topics  . Smoking status: Never Smoker  .  Smokeless tobacco: Never Used  . Alcohol use No  . Drug use: No  . Sexual activity: No   Other Topics Concern  . None   Social History Narrative  . None    Vitals:   07/16/16 0901  BP: 110/80  Pulse: 73  Resp: 12  O2 sat at RA 96% Body mass index is 25.88 kg/m.   Physical Exam  Nursing note and vitals reviewed. Constitutional: She is oriented to person, place, and time. She appears well-developed and well-nourished. She does not appear ill. No distress.  HENT:  Head: Atraumatic.  Mouth/Throat: Oropharynx is clear and moist and mucous membranes are normal.  Post nasal drainage. Clearing throat frequently during OV.  Eyes: Conjunctivae and EOM are normal.  Neck: No JVD present.  Cardiovascular: Normal rate and regular rhythm.   No murmur heard. Respiratory: Effort normal and breath sounds normal. No respiratory distress.  GI: Soft. Bowel sounds are normal. She exhibits no distension and no mass. There is no hepatomegaly. There is no tenderness.  Musculoskeletal: She exhibits no edema or tenderness.  Lymphadenopathy:    She has no cervical adenopathy.       Right: No supraclavicular adenopathy present.       Left: No supraclavicular adenopathy present.  Neurological: She is alert and oriented to person, place, and time. She has normal strength. No cranial nerve deficit. Gait normal.  Skin: Skin is warm. No erythema.  Psychiatric: Her mood appears anxious.  Well groomed, good eye contact.     ASSESSMENT AND PLAN:   Diagnoses and all orders for this visit:  Dysphagia, unspecified type  Possible causes discussed. GI referral placed to discuss EGD. She was instructed to eat small bites at the time, avoid steak or other foods that aggravate problem Instructed about warning signs.  -     esomeprazole (NEXIUM) 20 MG capsule; Take 1 capsule (20 mg total) by mouth 2 (two) times daily before a meal. -     Ambulatory referral to Gastroenterology  Gastroesophageal  reflux disease, esophagitis presence not specified  Stop Omeprazole. She agrees with trying Nexium 20 mg bid. GERD precautions discussed.  -     esomeprazole (NEXIUM) 20 MG capsule; Take 1 capsule (20 mg total) by mouth 2 (two) times daily before a meal. -     Ambulatory referral to Gastroenterology  Cough  Hx of chronic cough.  Most likely combination of allergies and GERD are aggravating problem. Instructed about warning sings. I do not think imaging is needed today.     -Nancy Herman was advised to seek immediate medical attention is symptoms suddenly get worse.      Melaney Tellefsen G. Martinique, MD  [facility]. [place] office.

## 2016-07-16 NOTE — Patient Instructions (Addendum)
A few things to remember from today's visit:   Gastroesophageal reflux disease, esophagitis presence not specified - Plan: esomeprazole (NEXIUM) 20 MG capsule, Ambulatory referral to Gastroenterology  Dysphagia, unspecified type - Plan: esomeprazole (NEXIUM) 20 MG capsule, Ambulatory referral to Gastroenterology  Cough    Avoid foods that make your symptoms worse, for example coffee, chocolate,pepermeint,alcohol, and greasy food. Raising the head of your bed about 6 inches may help with nocturnal symptoms.  Avoid tobacco use. Weight loss (if you are overweight). Avoid lying down for 3 hours after eating.  Instead 3 large meals daily try small and more frequent meals during the day.  Every medication have side effects and medications for GERD are not the exception.At this time I think benefit is greater than risk.    You should be evaluated immediately if bloody vomiting, bloody stools, black stools (like tar), difficulty swallowing, food gets stuck on the way down or choking when eating. Abnormal weight loss or severe abdominal pain.  If symptoms are not resolved sometimes endoscopy is necessary.   Please be sure medication list is accurate. If a new problem present, please set up appointment sooner than planned today.

## 2016-07-19 ENCOUNTER — Telehealth: Payer: Self-pay

## 2016-07-19 NOTE — Telephone Encounter (Signed)
Received PA request for Nexium 20 mg capsules. PA submitted & pending. Key: Selena Batten

## 2016-07-22 NOTE — Telephone Encounter (Signed)
PA approved, form faxed back to pharmacy. 

## 2016-07-25 ENCOUNTER — Encounter: Payer: Self-pay | Admitting: Pulmonary Disease

## 2016-07-25 ENCOUNTER — Ambulatory Visit (INDEPENDENT_AMBULATORY_CARE_PROVIDER_SITE_OTHER): Payer: Medicare HMO | Admitting: Pulmonary Disease

## 2016-07-25 VITALS — BP 122/66 | HR 75 | Ht 63.0 in | Wt 145.8 lb

## 2016-07-25 DIAGNOSIS — R1310 Dysphagia, unspecified: Secondary | ICD-10-CM

## 2016-07-25 DIAGNOSIS — K219 Gastro-esophageal reflux disease without esophagitis: Secondary | ICD-10-CM | POA: Diagnosis not present

## 2016-07-25 DIAGNOSIS — R059 Cough, unspecified: Secondary | ICD-10-CM

## 2016-07-25 DIAGNOSIS — R05 Cough: Secondary | ICD-10-CM

## 2016-07-25 DIAGNOSIS — J309 Allergic rhinitis, unspecified: Secondary | ICD-10-CM

## 2016-07-25 NOTE — Patient Instructions (Signed)
For your allergic rhinitis: Keep taking Flonase as you're doing Keep taking Zyrtec as you're doing  For your gastroesophageal reflux disease: Take Nexium over-the-counter, hold Pepcid  For your trouble swallowing: I will arrange for a barium swallow test Follow-up with Dr. Collene Mares for a possible endoscopy  For your cough: Continue using throat lozenges, warm beverages deceived her throat and try to suppress the cough is much as possible  I will see you back in 2-3 months or sooner if needed

## 2016-07-25 NOTE — Progress Notes (Signed)
Subjective:    Patient ID: Nancy Herman, female    DOB: [birth year], 71 y.o.   MRN: [id number]  Synopsis: Referred in February 2018 for cough in the setting of postnasal drip and acid reflux. She has suffered from a daily cough at least since Christmas 2017. She has a hiatal hernia.   HPI Chief Complaint  Patient presents with  . Follow-up    pt states cough had improved but is now returning, sometimes prod cough.  also notes a dryness in mouth and throat.  pt has stopped taking nexium d/t insurance not covering, but wants to restart on OTC.    Nancy Herman is doing "OK not great" because she is still having the feeling that food is getting stuck in her throat again.  She says that the cough went away to some degree but it came back again.  She feels like there is mucus sitting on her vocal cords which is hard to get out.  She feels a "dry tickle" in her throat.  She is to see GI medicine.  She notices some dysphagia: pills, solid foods.  She says that small meals and chewing slower helps. However she still feels that food is getting stuck at the level of her chest.   She tries to not eat within three hours of lying flat.    She has a little post nasal drip, but it is better.    No shortness except with severe coughing spells. These are still occurring once or twice a day.  She has throat lozenges which helps.     Past Medical History:  Diagnosis Date  . Allergy   . Anxiety   . Dyslipidemia   . Frequent headaches   . GERD (gastroesophageal reflux disease)   . Hyperlipidemia   . IBS (irritable bowel syndrome)   . 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 Thyroid disease   . Urine incontinence       Review of Systems  Constitutional: Negative for chills, fatigue and fever.  HENT: Positive for postnasal drip. Negative for sinus pain and sinus pressure.   Respiratory: Positive for cough. Negative for shortness of breath and wheezing.   Cardiovascular: Negative for chest pain,  palpitations and leg swelling.       Objective:   Physical Exam  Vitals:   07/25/16 0909  BP: 122/66  Pulse: 75  SpO2: 98%  Weight: 145 lb 12.8 oz (66.1 kg)  Height: 5\' 3"  (1.6 m)     Gen: well appearing HENT: OP clear, neck supple PULM: CTA B, normal percussion CV: RRR, no mgr, trace edema GI: BS+, soft, nontender Derm: no cyanosis or rash Psyche: normal mood and affect    Chest imaging: 03/2016 CXR: no evidence of lung disease 2001 Esophogram: 1.SMALL HIATAL HERNIA WITH NO DEMONSTRABLE REFLUX AND NO DIFFICULTY SWALLOWING A BARIUM TABLET. 2.THE REMAINDER OF THE UPPER INTESTINAL TRACT APPEARS NORMAL.  Records from her visit with our nurse practitioner reviewed were she was encouraged to use Flonase for her allergic rhinitis treatment in addition to acid reflux treatment.    Assessment & Plan:  Dysphagia, unspecified type - Plan: DG Esophagus  Cough  Gastroesophageal reflux disease, esophagitis presence not specified  Allergic rhinitis, unspecified seasonality, unspecified trigger   Discussion: Nancy Herman continues to suffer from a little bit of cough which based on her signs and symptoms are most likely related to ongoing gastroesophageal reflux. She notes dysphagia symptoms as well. I think it would be reasonable for her to  see GI again and consider an endoscopy. I will arrange for a barium swallow considering the dysphagia with pills and solid food.  Her allergic rhinitis seems to be fairly well controlled. She continues to have cough which may be driven by irritable larynx syndrome. If we've got the gastroesophageal reflux under control, the allergic rhinitis under control and she continues to cough then we will consider enrollment in a clinical trial for chronic refractory cough.  Plan: For your allergic rhinitis: Keep taking Flonase as you're doing Keep taking Zyrtec as you're doing  For your gastroesophageal reflux disease: Take Nexium over-the-counter, hold  Pepcid  For your trouble swallowing: I will arrange for a barium swallow test Follow-up with Dr. Collene Mares for a possible endoscopy  For your cough: Continue using throat lozenges, warm beverages deceived her throat and try to suppress the cough is much as possible  I will see you back in 2-3 months or sooner if needed  Current Outpatient Prescriptions:  .  famotidine (PEPCID) 20 MG tablet, Take 20 mg by mouth daily. 1 tab twice daily, Disp: , Rfl:  .  FLUoxetine (PROZAC) 20 MG capsule, Take 1 capsule (20 mg total) by mouth daily., Disp: 90 capsule, Rfl: 1 .  ipratropium (ATROVENT) 0.03 % nasal spray, 2 sprays in each nostril twice a day., Disp: 30 mL, Rfl: 3 .  levothyroxine (SYNTHROID, LEVOTHROID) 50 MCG tablet, Take 1 tablet (50 mcg total) by mouth daily before breakfast., Disp: 90 tablet, Rfl: 3 .  pravastatin (PRAVACHOL) 10 MG tablet, Take 1 tablet (10 mg total) by mouth daily., Disp: 90 tablet, Rfl: 1 .  Probiotic Product (PROBIOTIC-10 PO), Take by mouth., Disp: , Rfl:  .  esomeprazole (NEXIUM) 20 MG capsule, Take 1 capsule (20 mg total) by mouth 2 (two) times daily before a meal. (Patient not taking: Reported on 07/25/2016), Disp: 60 capsule, Rfl: 2

## 2016-07-30 ENCOUNTER — Ambulatory Visit (HOSPITAL_COMMUNITY)
Admission: RE | Admit: 2016-07-30 | Discharge: 2016-07-30 | Disposition: A | Discharge status: Home / Self Care | Payer: Medicare HMO | Source: Ambulatory Visit | Attending: Pulmonary Disease | Admitting: Pulmonary Disease

## 2016-07-30 DIAGNOSIS — R1310 Dysphagia, unspecified: Secondary | ICD-10-CM | POA: Diagnosis present

## 2016-08-05 ENCOUNTER — Telehealth: Payer: Self-pay | Admitting: Pulmonary Disease

## 2016-08-05 NOTE — Telephone Encounter (Signed)
Notes recorded by Juanito Doom, MD on 07/31/2016 at 12:45 PM EDT A, Please let the patient know this was OK Thanks, B ------------------------ Spoke with pt. She is aware of her esophogram results. Nothing further was needed.

## 2016-08-15 ENCOUNTER — Telehealth: Payer: Self-pay | Admitting: Pulmonary Disease

## 2016-08-15 NOTE — Telephone Encounter (Signed)
Pt was correct she was informed of her results per phone message 08/05/16. Informed pt to disregard letter. She had no further questions. Nothing further is needed

## 2016-08-20 ENCOUNTER — Encounter: Payer: Self-pay | Admitting: Endocrinology

## 2016-08-20 ENCOUNTER — Ambulatory Visit (INDEPENDENT_AMBULATORY_CARE_PROVIDER_SITE_OTHER): Payer: Medicare HMO | Admitting: Endocrinology

## 2016-08-20 VITALS — BP 118/70 | HR 77 | Wt 148.2 lb

## 2016-08-20 DIAGNOSIS — D352 Benign neoplasm of pituitary gland: Secondary | ICD-10-CM

## 2016-08-20 LAB — BASIC METABOLIC PANEL
BUN: 16 mg/dL (ref 6–23)
CO2: 27 mEq/L (ref 19–32)
Calcium: 9.2 mg/dL (ref 8.4–10.5)
Chloride: 108 mEq/L (ref 96–112)
Creatinine, Ser: 0.87 mg/dL (ref 0.40–1.20)
GFR: 83.02 mL/min (ref >60.00)
Glucose, Bld: 81 mg/dL (ref 70–99)
Potassium: 4 mEq/L (ref 3.5–5.1)
Sodium: 143 mEq/L (ref 135–145)

## 2016-08-20 LAB — T4, FREE: Free T4: 0.83 ng/dL (ref 0.60–1.60)

## 2016-08-20 LAB — TSH: TSH: 1.1 u[IU]/mL (ref 0.35–4.50)

## 2016-08-20 NOTE — Patient Instructions (Addendum)
Thyroid blood tests are requested for you today.  We'll let you know about the results.  Please come back for a follow-up appointment in 6 months.   

## 2016-08-20 NOTE — Progress Notes (Signed)
Subjective:    Patient ID: Nancy Herman, female    DOB: [birth year], 71 y.o.   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  pt states she feels well in general.   Past Medical History:  Diagnosis Date  . Allergy   . Anxiety   . Dyslipidemia   . Frequent headaches   . GERD (gastroesophageal reflux disease)   . Hyperlipidemia   . IBS (irritable bowel syndrome)   . 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 Thyroid disease   . Urine incontinence     Past Surgical History:  Procedure Laterality Date  . ABDOMINAL HYSTERECTOMY  1990  . BRAIN SURGERY    . OVARIAN CYST SURGERY  1994  . REPAIR RECTOCELE  2016   and prolapsed uterus.  . TONSILLECTOMY  1973    Social History   Social History  . Marital status: Single    Spouse name: N/A  . Number of children: N/A  . Years of education: N/A   Occupational History  . Not on file.   Social History Main Topics  . Smoking status: Never Smoker  . Smokeless tobacco: Never Used  . Alcohol use No  . Drug use: No  . Sexual activity: No   Other Topics Concern  . Not on file   Social History Narrative  . No narrative on file    Current Outpatient Prescriptions on File Prior to Visit  Medication Sig Dispense Refill  . esomeprazole (NEXIUM) 20 MG capsule Take 1 capsule (20 mg total) by mouth 2 (two) times daily before a meal. 60 capsule 2  . FLUoxetine (PROZAC) 20 MG capsule Take 1 capsule (20 mg total) by mouth daily. 90 capsule 1  . ipratropium (ATROVENT) 0.03 % nasal spray 2 sprays in each nostril twice a day. 30 mL 3  . levothyroxine (SYNTHROID, LEVOTHROID) 50 MCG tablet Take 1 tablet (50 mcg total) by mouth daily before breakfast. 90 tablet 3  .  pravastatin (PRAVACHOL) 10 MG tablet Take 1 tablet (10 mg total) by mouth daily. 90 tablet 1  . Probiotic Product (PROBIOTIC-10 PO) Take by mouth.    . famotidine (PEPCID) 20 MG tablet Take 20 mg by mouth daily. 1 tab twice daily     No current facility-administered medications on file prior to visit.     No Known Allergies  Family History  Problem Relation Age of Onset  . Heart disease Mother   . Alcohol abuse Mother   . Drug abuse Mother   . Heart disease Father   . Drug abuse Father   . Alcohol abuse Father     BP 118/70   Pulse 77   Wt 148 lb 3.2 oz (67.2 kg)   LMP  (LMP Unknown)   SpO2 98%   BMI 26.25 kg/m    Review of Systems Denies headache.      Objective:   Physical Exam VITAL SIGNS:  See vs page.   GENERAL: no distress.   Ext: no edema.   Gait: normal and steady.      Assessment & Plan:  Central hypothyroidism: due for recheck Pituitary insuff: we'll also recheck BMET  Patient Instructions  Thyroid blood  tests are requested for you today.  We'll let you know about the results.   Please come back for a follow-up appointment in 6 months.

## 2016-08-23 ENCOUNTER — Encounter: Payer: Self-pay | Admitting: Vascular Surgery

## 2016-08-29 ENCOUNTER — Ambulatory Visit (HOSPITAL_COMMUNITY)
Admission: RE | Admit: 2016-08-29 | Discharge: 2016-08-29 | Disposition: A | Discharge status: Home / Self Care | Payer: Medicare HMO | Source: Ambulatory Visit | Attending: Vascular Surgery | Admitting: Vascular Surgery

## 2016-08-29 ENCOUNTER — Ambulatory Visit (INDEPENDENT_AMBULATORY_CARE_PROVIDER_SITE_OTHER): Payer: Medicare HMO | Admitting: Vascular Surgery

## 2016-08-29 ENCOUNTER — Encounter: Payer: Self-pay | Admitting: Vascular Surgery

## 2016-08-29 VITALS — BP 124/80 | HR 67 | Temp 98.7°F | Resp 16 | Ht 63.0 in | Wt 145.9 lb

## 2016-08-29 DIAGNOSIS — I83899 Varicose veins of unspecified lower extremities with other complications: Secondary | ICD-10-CM

## 2016-08-29 DIAGNOSIS — I87303 Chronic venous hypertension (idiopathic) without complications of bilateral lower extremity: Secondary | ICD-10-CM

## 2016-08-29 NOTE — Progress Notes (Signed)
Referring Physician: Dr Betty Martinique  Patient name: Nancy Herman MRN: [id number] DOB: October 26, 1945 Sex: female  REASON FOR CONSULT:   HPI: Nancy Herman is a 71 y.o. female referred for evaluation of leg pain. The patient states that her legs get full heavy on HD as the day progresses if she is on her legs all day long. She has stopped exercising due to pain in her legs. She does not really describe claudication-type symptoms. She has no family history of varicose veins. She denies tobacco abuse. She has no prior history of DVT. She has had no prior lower extremity procedures done. Other medical problems include hyperlipidemia which has been stable.  Past Medical History:  Diagnosis Date  . Allergy   . Anxiety   . Dyslipidemia   . Frequent headaches   . GERD (gastroesophageal reflux disease)   . Hyperlipidemia   . IBS (irritable bowel syndrome)   . 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 Thyroid disease   . Urine incontinence    Past Surgical History:  Procedure Laterality Date  . ABDOMINAL HYSTERECTOMY  1990  . BRAIN SURGERY    . OVARIAN CYST SURGERY  1994  . REPAIR RECTOCELE  2016   and prolapsed uterus.  . TONSILLECTOMY  1973    Family History  Problem Relation Age of Onset  . Heart disease Mother   . Alcohol abuse Mother   . Drug abuse Mother   . Heart disease Father   . Drug abuse Father   . Alcohol abuse Father     SOCIAL HISTORY: Social History   Social History  . Marital status: Single    Spouse name: N/A  . Number of children: N/A  . Years of education: N/A   Occupational History  . Not on file.   Social History Main Topics  . Smoking status: Never Smoker  . Smokeless tobacco: Never Used  . Alcohol use No  . Drug use: No  . Sexual activity: No   Other Topics Concern  . Not on file   Social History Narrative  . No narrative on file    No Known Allergies  Current Outpatient Prescriptions  Medication Sig Dispense Refill  .  esomeprazole (NEXIUM) 20 MG capsule Take 1 capsule (20 mg total) by mouth 2 (two) times daily before a meal. 60 capsule 2  . famotidine (PEPCID) 20 MG tablet Take 20 mg by mouth daily. 1 tab twice daily    . FLUoxetine (PROZAC) 20 MG capsule Take 1 capsule (20 mg total) by mouth daily. 90 capsule 1  . ipratropium (ATROVENT) 0.03 % nasal spray 2 sprays in each nostril twice a day. 30 mL 3  . levothyroxine (SYNTHROID, LEVOTHROID) 50 MCG tablet Take 1 tablet (50 mcg total) by mouth daily before breakfast. 90 tablet 3  . pravastatin (PRAVACHOL) 10 MG tablet Take 1 tablet (10 mg total) by mouth daily. 90 tablet 1  . Probiotic Product (PROBIOTIC-10 PO) Take by mouth.     No current facility-administered medications for this visit.     ROS:   General:  No weight loss, Fever, chills  HEENT: No recent headaches, no nasal bleeding, no visual changes, no sore throat  Neurologic: No dizziness, blackouts, seizures. No recent symptoms of stroke or mini- stroke. No recent episodes of slurred speech, or temporary blindness.  Cardiac: No recent episodes of chest pain/pressure, no shortness of breath at rest.  No shortness of breath with exertion.  Denies history of atrial  fibrillation or irregular heartbeat  Vascular: No history of rest pain in feet.  No history of claudication.  No history of non-healing ulcer, No history of DVT   Pulmonary: No home oxygen, no productive cough, no hemoptysis,  No asthma or wheezing  Musculoskeletal:  [X]  Arthritis, [ ]  Low back pain,  [ ]  Joint pain  Hematologic:No history of hypercoagulable state.  No history of easy bleeding.  No history of anemia  Gastrointestinal: No hematochezia or melena,  No gastroesophageal reflux, no trouble swallowing  Urinary: [ ]  chronic Kidney disease, [ ]  on HD - [ ]  MWF or [ ]  TTHS, [ ]  Burning with urination, [ ]  Frequent urination, [ ]  Difficulty urinating;   Skin: No rashes  Psychological: No history of anxiety,  No history of  depression   Physical Examination  Vitals:   08/29/16 1407  BP: 124/80  Pulse: 67  Resp: 16  Temp: 98.7 F (37.1 C)  TempSrc: Oral  SpO2: 99%  Weight: 145 lb 14.4 oz (66.2 kg)  Height: 5\' 3"  (1.6 m)    Body mass index is 25.85 kg/m.  General:  Alert and oriented, no acute distress HEENT: Normal Neck: No bruit or JVD Pulmonary: Clear to auscultation bilaterally Cardiac: Regular Rate and Rhythm without murmur Abdomen: Soft, non-tender, non-distended, no mass, no scars Skin: No rash Extremity Pulses:  2+ radial, brachial, femoral, dorsalis pedis pulses bilaterally Musculoskeletal: No deformity trace pretibial edema  Neurologic: Upper and lower extremity motor 5/5 and symmetric  DATA:  Patient has bilateral lower extremity venous reflux exam today. She had mild reflux in the common femoral vein bilaterally. She had no evidence of deep fascial venous reflux and no other significant deep vein reflux bilaterally. Overall mildly to minimally positive study.  ASSESSMENT:  Lower extremity pain fullness aching. She probably has a component of venous hypertension with mild venous reflux. No indication for laser ablation for this in the absence of superficial venous reflux.   PLAN:  Patient was given a prescription for lower extremity compression stockings to wear daily. I also encouraged her to continue her exercise.  No evidence of arterial occlusive disease.  She will follow-up with [place] on as-needed basis.   Ruta Hinds, MD [facility] Office: [phone] Pager: [phone]

## 2016-09-03 ENCOUNTER — Telehealth: Payer: Self-pay | Admitting: Family Medicine

## 2016-09-03 NOTE — Telephone Encounter (Signed)
Pt would like to have Rx omeprazole   Pharm:  [facility] [place] and would like to know if it is okay for her to take bid in the morning due to the nexium too expensive.

## 2016-09-03 NOTE — Telephone Encounter (Signed)
Okay to take twice daily to replace the Nexium?

## 2016-09-04 ENCOUNTER — Other Ambulatory Visit: Payer: Self-pay | Admitting: Family Medicine

## 2016-09-04 ENCOUNTER — Telehealth: Payer: Self-pay | Admitting: Family Medicine

## 2016-09-04 DIAGNOSIS — K219 Gastro-esophageal reflux disease without esophagitis: Secondary | ICD-10-CM

## 2016-09-04 MED ORDER — OMEPRAZOLE 20 MG PO CPDR: 20.0000 mg | DELAYED_RELEASE_CAPSULE | Freq: Two times a day (BID) | ORAL | 3 refills | Status: DC

## 2016-09-04 NOTE — Telephone Encounter (Signed)
Patient is aware that Rx is at the pharmacy, she currently has some at home, but will pick it up when she runs out. Nothing further needed.

## 2016-09-04 NOTE — Telephone Encounter (Signed)
The omeprazole was sent to [facility] instead of [facility] [address]. [facility] to [facility]

## 2016-09-04 NOTE — Telephone Encounter (Signed)
Rx resent to [facility]

## 2016-09-04 NOTE — Telephone Encounter (Signed)
Rx for Omeprazole 20 mg to take bid before meals was sent to her pharmacy. She can discontinue Nexium.  Thanks, BJ

## 2016-09-09 ENCOUNTER — Ambulatory Visit
Admission: RE | Admit: 2016-09-09 | Discharge: 2016-09-09 | Disposition: A | Discharge status: Home / Self Care | Payer: Medicare HMO | Source: Ambulatory Visit | Attending: Family Medicine | Admitting: Family Medicine

## 2016-09-09 DIAGNOSIS — Z1231 Encounter for screening mammogram for malignant neoplasm of breast: Secondary | ICD-10-CM

## 2016-09-12 ENCOUNTER — Other Ambulatory Visit: Payer: Self-pay | Admitting: Family Medicine

## 2016-09-29 NOTE — Progress Notes (Signed)
HPI:   [name] is a 70 y.o. female, who is here today to follow on some chronic medical problems.  I last saw her on 07/16/16 for acute visit, c/o dysphagia.Hx of GERD, Omeprazole changed to Nexium but not covered by her insurance. She is back to Omeprazole 20 mg bid.  Cough and dysphagia greatly improved. She has noted that cough is exacerbated by temp changes and with air conditioner.  Denies abdominal pain, nausea, vomiting, changes in bowel habits, blood in stool or melena.  Since her last OV she followed with pulmonologist, Dr Lake Bells.   Hyperlipidemia:  Currently on Pravastatin, which was decreased from 20 mg to 10 mg in 05/2016.  Following a low fat diet: Yes.  She has not noted side effects with medication.  Lab Results  Component Value Date   CHOL 200 10/23/2015   HDL 82.00 10/23/2015   LDLCALC 104 (H) 10/23/2015   TRIG 71.0 10/23/2015   CHOLHDL 2 10/23/2015   Anxiety:  Currently she is on Prozac 20 mg since 10/2015.  She denies depressed mood or suicidal thoughts. + Anxiety, stable. Hx of IBS-C  Hypothyroidism, she follows with Dr Loanne Drilling.   Concerns today:   She would like to have B12 check. She used to get B12 injections long time ago. She craves sugar and salt.   She also needs DEXA.  She mentions chest tightness that seems to happen after eating. She is not sure about type of food that aggravate symptom. "Scary feeling", pain happens at rest, last about 5 min or "little more", resolves gradually. She denies associated diaphoresis,dyspnea,palpitation,or dizziness. She denies exertional symptoms.  She has had this problem since beginning of this summer and has not had symptoms recently.  She had Lexiscan stress test on 04/06/2015.  Urinary frequency for a while. Nocturia better, 0-1.  Denies dysuria, gross hematuria,or decreased urine output.  She also mentions that she went to see vascular and LE varicose veins treatment  was not recommended except for compression stockings.   Review of Systems  Constitutional: Positive for fatigue (no more than usual). Negative for activity change, appetite change, diaphoresis and fever.  HENT: Negative for mouth sores, nosebleeds, sore throat and voice change.   Eyes: Negative for redness and visual disturbance.  Respiratory: Negative for cough, shortness of breath and wheezing.   Cardiovascular: Negative for palpitations and leg swelling.  Gastrointestinal: Negative for abdominal pain, nausea and vomiting.       Negative for changes in bowel habits.  Endocrine: Negative for cold intolerance and heat intolerance.  Genitourinary: Positive for frequency. Negative for decreased urine volume, dysuria and hematuria.  Musculoskeletal: Negative for gait problem and myalgias.  Skin: Negative for pallor and rash.  Neurological: Negative for syncope, weakness and headaches.  Psychiatric/Behavioral: Positive for sleep disturbance. Negative for confusion. The patient is nervous/anxious.       Current Outpatient Prescriptions on File Prior to Visit  Medication Sig Dispense Refill  . famotidine (PEPCID) 20 MG tablet Take 20 mg by mouth daily. 1 tab twice daily    . FLUoxetine (PROZAC) 20 MG capsule TAKE ONE CAPSULE BY MOUTH ONCE DAILY 90 capsule 1  . ipratropium (ATROVENT) 0.03 % nasal spray 2 sprays in each nostril twice a day. 30 mL 3  . levothyroxine (SYNTHROID, LEVOTHROID) 50 MCG tablet Take 1 tablet (50 mcg total) by mouth daily before breakfast. 90 tablet 3  . omeprazole (PRILOSEC) 20 MG capsule Take 1 capsule (20 mg total)  by mouth 2 (two) times daily before a meal. 60 capsule 3  . pravastatin (PRAVACHOL) 10 MG tablet Take 1 tablet (10 mg total) by mouth daily. 90 tablet 1  . Probiotic Product (PROBIOTIC-10 PO) Take by mouth.     No current facility-administered medications on file prior to visit.      Past Medical History:  Diagnosis Date  . Allergy   . Anxiety   .  Dyslipidemia   . Frequent headaches   . GERD (gastroesophageal reflux disease)   . Hyperlipidemia   . IBS (irritable bowel syndrome)   . 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 Thyroid disease   . Urine incontinence    No Known Allergies  Social History   Social History  . Marital status: Single    Spouse name: N/A  . Number of children: N/A  . Years of education: N/A   Social History Main Topics  . Smoking status: Never Smoker  . Smokeless tobacco: Never Used  . Alcohol use No  . Drug use: No  . Sexual activity: No   Other Topics Concern  . None   Social History Narrative  . None    Vitals:   09/30/16 0913  BP: 128/80  Pulse: 79  Resp: 12  SpO2: 98%   Body mass index is 26.22 kg/m.   Physical Exam  Nursing note and vitals reviewed. Constitutional: She is oriented to person, place, and time. She appears well-developed and well-nourished. No distress.  HENT:  Head: Normocephalic and atraumatic.  Mouth/Throat: Oropharynx is clear and moist and mucous membranes are normal.  Eyes: Pupils are equal, round, and reactive to light. Conjunctivae and EOM are normal.  Cardiovascular: Normal rate and regular rhythm.   No murmur heard. Pulses:      Dorsalis pedis pulses are 2+ on the right side, and 2+ on the left side.  Respiratory: Effort normal and breath sounds normal. No respiratory distress. She exhibits no tenderness.  GI: Soft. She exhibits no mass. There is no hepatomegaly. There is no tenderness.  Musculoskeletal: She exhibits no edema.  Lymphadenopathy:    She has no cervical adenopathy.  Neurological: She is alert and oriented to person, place, and time. She has normal strength. Coordination and gait normal.  Skin: Skin is warm. No erythema.  Psychiatric: Her mood appears anxious.  Well groomed, good eye contact.    ASSESSMENT AND PLAN:   [name] was seen today for follow-up.  Diagnoses and all orders for this visit:  Lab Results  Component  Value Date   CHOL 233 (H) 09/30/2016   HDL 89.50 09/30/2016   LDLCALC 136 (H) 09/30/2016   TRIG 36.0 09/30/2016   CHOLHDL 3 09/30/2016     Chemistry      Component Value Date/Time   NA 142 09/30/2016 1007   K 4.9 10/03/2016 0942   CL 105 09/30/2016 1007   CO2 34 (H) 09/30/2016 1007   BUN 15 09/30/2016 1007   CREATININE 0.92 09/30/2016 1007      Component Value Date/Time   CALCIUM 9.7 09/30/2016 1007   ALKPHOS 62 09/30/2016 1007   AST 18 09/30/2016 1007   ALT 14 09/30/2016 1007   BILITOT 0.5 09/30/2016 1007      Sensation of chest tightness  She has complains about chest discomfort for a while. Reviewing records, in 1995 she had esophageal manometry because "dysphagia and chest pain with negative exhaustive work-up failing to delineate a distinct etiology." Manometry was also negative. We discussed possible  causes, including cardia and GI. She states that it is occasional and mild discomfort "no pain." She is not interested in further work-up for now. She understands warning signs. Stress test in 04/2015 negative.I do not have copy of EKG's done in the past.  I will see her back in 3 months,before if needed.  Gastroesophageal reflux disease, esophagitis presence not specified  Cough and dysphagia improved. GERD precautions discussed. No changes in Omeprazole 20 mg bid.  Generalized anxiety disorder  Stable but still symptomatic. No changes in current management. F/U in 3 months.  Hyperlipidemia, unspecified hyperlipidemia type  No changes in current management, will follow labs done today and will give further recommendations accordingly. F/U in 6-12 months.  The 10-year ASCVD risk score Mikey Bussing DC Brooke Bonito., et al., 2013) is: 12.6%   Values used to calculate the score:     Age: 64 years     Sex: Female     Is Non-Hispanic African American: Yes     Diabetic: No     Tobacco smoker: No     Systolic Blood Pressure: 035 mmHg     Is BP treated: No     HDL Cholesterol:  89.5 mg/dL     Total Cholesterol: 233 mg/dL  -     Comprehensive metabolic panel -     Lipid panel  Overactive bladder  Urinary frequency for "a while." Educated about Dx as treatment options + side effects.She does not want to try meds, it has been stable. U/A ordered today,further recommendations will be given according to results.  -     Urinalysis, Routine w reflex microscopic  B12 deficiency  Further recommendations will be given according to lab esults.  -     Vitamin B12    -Ms. TOBI GROESBECK was advised to return sooner than planned today if new concerns arise.       Jearlean Demauro G. Martinique, MD  [facility]. [place] office.

## 2016-09-30 ENCOUNTER — Ambulatory Visit (INDEPENDENT_AMBULATORY_CARE_PROVIDER_SITE_OTHER): Payer: Medicare HMO | Admitting: Family Medicine

## 2016-09-30 ENCOUNTER — Encounter: Payer: Self-pay | Admitting: Family Medicine

## 2016-09-30 VITALS — BP 128/80 | HR 79 | Resp 12 | Ht 63.0 in | Wt 148.0 lb

## 2016-09-30 DIAGNOSIS — R0789 Other chest pain: Secondary | ICD-10-CM | POA: Diagnosis not present

## 2016-09-30 DIAGNOSIS — N3281 Overactive bladder: Secondary | ICD-10-CM

## 2016-09-30 DIAGNOSIS — E785 Hyperlipidemia, unspecified: Secondary | ICD-10-CM

## 2016-09-30 DIAGNOSIS — E538 Deficiency of other specified B group vitamins: Secondary | ICD-10-CM

## 2016-09-30 DIAGNOSIS — F411 Generalized anxiety disorder: Secondary | ICD-10-CM

## 2016-09-30 DIAGNOSIS — K219 Gastro-esophageal reflux disease without esophagitis: Secondary | ICD-10-CM

## 2016-09-30 LAB — URINALYSIS, ROUTINE W REFLEX MICROSCOPIC
Bilirubin Urine: NEGATIVE
Hgb urine dipstick: NEGATIVE
Ketones, ur: NEGATIVE
Leukocytes, UA: NEGATIVE
Nitrite: NEGATIVE
RBC / HPF: NONE SEEN (ref 0–?)
Specific Gravity, Urine: 1.01 (ref 1.000–1.030)
Total Protein, Urine: NEGATIVE
Urine Glucose: NEGATIVE
Urobilinogen, UA: 0.2 (ref 0.0–1.0)
WBC, UA: NONE SEEN (ref 0–?)
pH: 7.5 (ref 5.0–8.0)

## 2016-09-30 LAB — COMPREHENSIVE METABOLIC PANEL
ALT: 14 U/L (ref 0–35)
AST: 18 U/L (ref 0–37)
Albumin: 4 g/dL (ref 3.5–5.2)
Alkaline Phosphatase: 62 U/L (ref 39–117)
BUN: 15 mg/dL (ref 6–23)
CO2: 34 mEq/L — ABNORMAL HIGH (ref 19–32)
Calcium: 9.7 mg/dL (ref 8.4–10.5)
Chloride: 105 mEq/L (ref 96–112)
Creatinine, Ser: 0.92 mg/dL (ref 0.40–1.20)
GFR: 77.81 mL/min (ref >60.00)
Glucose, Bld: 86 mg/dL (ref 70–99)
Potassium: 5.2 mEq/L — ABNORMAL HIGH (ref 3.5–5.1)
Sodium: 142 mEq/L (ref 135–145)
Total Bilirubin: 0.5 mg/dL (ref 0.2–1.2)
Total Protein: 6.9 g/dL (ref 6.0–8.3)

## 2016-09-30 LAB — LIPID PANEL
Cholesterol: 233 mg/dL — ABNORMAL HIGH (ref 0–200)
HDL: 89.5 mg/dL (ref >39.00)
LDL Cholesterol: 136 mg/dL — ABNORMAL HIGH (ref 0–99)
NonHDL: 143.25
Total CHOL/HDL Ratio: 3
Triglycerides: 36 mg/dL (ref 0.0–149.0)
VLDL: 7.2 mg/dL (ref 0.0–40.0)

## 2016-09-30 LAB — VITAMIN B12: Vitamin B-12: 468 pg/mL (ref 211–911)

## 2016-09-30 NOTE — Patient Instructions (Signed)
A few things to remember from today's visit:   Gastroesophageal reflux disease, esophagitis presence not specified  Generalized anxiety disorder  Hyperlipidemia, unspecified hyperlipidemia type - Plan: Comprehensive metabolic panel, Lipid panel  Overactive bladder - Plan: Urinalysis, Routine w reflex microscopic  Sensation of chest tightness  B12 deficiency - Plan: Vitamin B12   Food Choices for Gastroesophageal Reflux Disease, Adult When you have gastroesophageal reflux disease (GERD), the foods you eat and your eating habits are very important. Choosing the right foods can help ease your discomfort. What guidelines do I need to follow?  Choose fruits, vegetables, whole grains, and low-fat dairy products.  Choose low-fat meat, fish, and poultry.  Limit fats such as oils, salad dressings, butter, nuts, and avocado.  Keep a food diary. This helps you identify foods that cause symptoms.  Avoid foods that cause symptoms. These may be different for everyone.  Eat small meals often instead of 3 large meals a day.  Eat your meals slowly, in a place where you are relaxed.  Limit fried foods.  Cook foods using methods other than frying.  Avoid drinking alcohol.  Avoid drinking large amounts of liquids with your meals.  Avoid bending over or lying down until 2-3 hours after eating. What foods are not recommended? These are some foods and drinks that may make your symptoms worse: Vegetables Tomatoes. Tomato juice. Tomato and spaghetti sauce. Chili peppers. Onion and garlic. Horseradish. Fruits Oranges, grapefruit, and lemon (fruit and juice). Meats High-fat meats, fish, and poultry. This includes hot dogs, ribs, ham, sausage, salami, and bacon. Dairy Whole milk and chocolate milk. Sour cream. Cream. Butter. Ice cream. Cream cheese. Drinks Coffee and tea. Bubbly (carbonated) drinks or energy drinks. Condiments Hot sauce. Barbecue sauce. Sweets/Desserts Chocolate and  cocoa. Donuts. Peppermint and spearmint. Fats and Oils High-fat foods. This includes [place] fries and potato chips. Other Vinegar. Strong spices. This includes black pepper, white pepper, red pepper, cayenne, curry powder, cloves, ginger, and chili powder. The items listed above may not be a complete list of foods and drinks to avoid. Contact your dietitian for more information. This information is not intended to replace advice given to you by your health care provider. Make sure you discuss any questions you have with your health care provider. Document Released: 07/23/2011 Document Revised: 06/29/2015 Document Reviewed: 11/25/2012 Elsevier Interactive Patient Education  2017 [place].   Please be sure medication list is accurate. If a new problem present, please set up appointment sooner than planned today.

## 2016-10-02 ENCOUNTER — Other Ambulatory Visit: Payer: Self-pay

## 2016-10-02 DIAGNOSIS — E2839 Other primary ovarian failure: Secondary | ICD-10-CM

## 2016-10-03 ENCOUNTER — Encounter: Payer: Self-pay | Admitting: Family Medicine

## 2016-10-03 ENCOUNTER — Other Ambulatory Visit (INDEPENDENT_AMBULATORY_CARE_PROVIDER_SITE_OTHER): Payer: Medicare HMO

## 2016-10-03 DIAGNOSIS — E876 Hypokalemia: Secondary | ICD-10-CM | POA: Diagnosis not present

## 2016-10-03 LAB — POTASSIUM: Potassium: 4.9 mEq/L (ref 3.5–5.1)

## 2016-10-23 NOTE — Progress Notes (Signed)
Subjective:   Nancy Herman is a 71 y.o. female who presents for Medicare Annual (Subsequent) preventive examination.  The Patient was informed that the wellness visit is to identify future health risk and educate and initiate measures that can reduce risk for increased disease through the lifespan.    Annual Wellness Assessment  Reports health as good   Preventive Screening -Counseling & Management  Medicare Annual Preventive Care Visit - Subsequent Last OV 8/27   Health Maintenance Due  Topic Date Due  . Hepatitis C Screening  1946/01/25  . TETANUS/TDAP  08/20/1966  . COLONOSCOPY  07/07/2005  . DEXA SCAN  08/19/2012  . PNA vac Low Risk Adult (1 of 2 - PCV13) 08/19/2012  . INFLUENZA VACCINE  09/04/2016   Mammogram 09/2016 Colonoscopy / thinks she had one in 2016; in [place]  -  Dr. Collene Mares following;  Phone: [phone] - call to Dr. Lorie Apley office and last one was 04/27/04/so she would need one in 2016; they did endoscopic But no colonoscopies so I cannot confirm information or if one is due   Possibly in 3 years  Getting bone density on Monday  at [place] (9/24)  Pneumonia series awaiting records to be scanned  Nancy Herman; [phone] in [place]; tried to call from this office today and did not get an answer   Hepatitis C will check for exposure  Flu vaccine; will take to day     VS reviewed;   Diet  Emotionally eating Regular potato chips  Breakfast fiber cereal; fiber 1 /Kaschi cereal with almond milk Has a piece of toast Hot drink- waffle  Main meal was lunch  Supper   BMI 26   Exercise Used to walk and went to aerobics  4 days  Sleep; not sleeping as well  Has always been an early riser     Cardiac Risk Factors Addressed Hyperlipidemia Chol 233; hdl 89; ldl 136 and trig 36    Advanced Directives Advanced Directive; Reviewed advanced directive and agreed to receipt of information and discussion.  Focused face to face x  20 minutes  discussing HCPOA and Living will and reviewed all the questions in the [place] forms. The patient voices understanding of HCPOA; LW reviewed and information provided on each question. Educated on how to revoke this HCPOA or LW at any time.   Also  discussed life prolonging measures (given a few examples) and where she could choose to initiate or not;  the ability to given the HCPOA power to change her living will or not if she cannot speak for herself; as well as finalizing the will by 2 unrelated witnesses and notary.  Will call for questions and given information on [facility] pastoral department for further assistance.      Patient Care Team: Martinique, Betty G, MD as PCP - General [facility]) Dr. Pennie Banter pulmonologist      Cardiac Risk Factors include: advanced age (>45men, >34 women);female gender;Other (see comment)     Objective:     Vitals: BP 120/60   Pulse 75   Ht 5\' 3"  (1.6 m)   Wt 150 lb (68 kg)   LMP  (LMP Unknown)   SpO2 96%   BMI 26.57 kg/m   Body mass index is 26.57 kg/m.   Tobacco History  Smoking Status  . Never Smoker  Smokeless Tobacco  . Never Used     Counseling given: Yes   Past Medical History:  Diagnosis Date  .  Allergy   . Anxiety   . Dyslipidemia   . Frequent headaches   . GERD (gastroesophageal reflux disease)   . Hyperlipidemia   . IBS (irritable bowel syndrome)   . 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 Thyroid disease   . Urine incontinence    Past Surgical History:  Procedure Laterality Date  . ABDOMINAL HYSTERECTOMY  1990  . BRAIN SURGERY    . BREAST EXCISIONAL BIOPSY Right unsure  . BREAST EXCISIONAL BIOPSY Left unsure  . OVARIAN CYST SURGERY  1994  . REPAIR RECTOCELE  2016   and prolapsed uterus.  . TONSILLECTOMY  1973   Family History  Problem Relation Age of Onset  . Heart disease Mother   . Alcohol abuse Mother   . Drug abuse Mother   . Heart disease Father   . Drug abuse Father   . Alcohol abuse Father     History  Sexual Activity  . Sexual activity: No    Outpatient Encounter Prescriptions as of 10/24/2016  Medication Sig  . famotidine (PEPCID) 20 MG tablet Take 20 mg by mouth daily. 1 tab twice daily  . FLUoxetine (PROZAC) 20 MG capsule TAKE ONE CAPSULE BY MOUTH ONCE DAILY  . ipratropium (ATROVENT) 0.03 % nasal spray 2 sprays in each nostril twice a day.  . levothyroxine (SYNTHROID, LEVOTHROID) 50 MCG tablet Take 1 tablet (50 mcg total) by mouth daily before breakfast.  . omeprazole (PRILOSEC) 20 MG capsule Take 1 capsule (20 mg total) by mouth 2 (two) times daily before a meal.  . pravastatin (PRAVACHOL) 10 MG tablet Take 1 tablet (10 mg total) by mouth daily.  . Probiotic Product (PROBIOTIC-10 PO) Take by mouth.   No facility-administered encounter medications on file as of 10/24/2016.     Activities of Daily Living In your present state of health, do you have any difficulty performing the following activities: 10/24/2016  Hearing? N  Vision? N  Walking or climbing stairs? N  Dressing or bathing? N  Doing errands, shopping? N  Preparing Food and eating ? N  Using the Toilet? N  In the past six months, have you accidently leaked urine? N  Do you have problems with loss of bowel control? Y  Comment irritable bowels   Managing your Medications? N  Managing your Finances? N  Housekeeping or managing your Housekeeping? N  Some recent data might be hidden    Patient Care Team: Martinique, Betty G, MD as PCP - General (Family Medicine)    Assessment:     Exercise Activities and Dietary recommendations Current Exercise Habits: Home exercise routine, Type of exercise: walking, Time (Minutes): 45, Frequency (Times/Week): 4, Weekly Exercise (Minutes/Week): 180, Intensity: Moderate  Goals    . Exercise 150 minutes per week (moderate activity)          Will start walking; walks at the mall at 9:30       Fall Risk Fall Risk  10/24/2016  Falls in the past year? No   Depression  Screen PHQ 2/9 Scores 10/24/2016  PHQ - 2 Score 0     Cognitive Function Ad8 score reviewed for issues:  Issues making decisions:  Less interest in hobbies / activities:  Repeats questions, stories (family complaining):  Trouble using ordinary gadgets (microwave, computer, phone):  Forgets the month or year:   Mismanaging finances:   Remembering appts:  Daily problems with thinking and/or memory: Ad8 score is=0            Immunization History  Administered Date(s) Administered  . Influenza, High Dose Seasonal PF 11/06/2015, 10/24/2016  . Pneumococcal Conjugate-13 09/04/2013  . Pneumococcal Polysaccharide-23 08/04/2012   Screening Tests Health Maintenance  Topic Date Due  . Hepatitis C Screening  12/06/1945  . TETANUS/TDAP  08/20/1966  . COLONOSCOPY  07/07/2005  . DEXA SCAN  08/19/2012  . PNA vac Low Risk Adult (1 of 2 - PCV13) 08/19/2012  . INFLUENZA VACCINE  09/04/2016  . MAMMOGRAM  09/10/2018      Plan:     PCP Notes   Health Maintenance  Colonoscopy / thinks she had one in 2016; in [place]  - Dr. Collene Mares Phone: [phone] called and did not do a colonoscopy . Cannot confirm when it is due so will leave it in the overdue health maintenance   Getting bone density on Monday at [facility]   Call to her Primary care office while here today for IMM hx Added to the IMM screen as stated below   Pneumonia vaccine July 2014  Prevnar 16 September 2013;  TD; no tetanus  Prefers not to take TD today due to cost  Given information regarding taking at the pharmacy    Abnormal Screens  none  Referrals  none  Patient concerns; none  Nurse Concerns; As noted   Next PCP apt TBS     I have personally reviewed and noted the following in the patient's chart:   . Medical and social history . Use of alcohol, tobacco or illicit drugs  . Current medications and supplements . Functional ability and status . Nutritional status . Physical activity . Advanced  directives . List of other physicians . Hospitalizations, surgeries, and ER visits in previous 12 months . Vitals . Screenings to include cognitive, depression, and falls . Referrals and appointments  In addition, I have reviewed and discussed with patient certain preventive protocols, quality metrics, and best practice recommendations. A written personalized care plan for preventive services as well as general preventive health recommendations were provided to patient.     Wynetta Fines, RN  10/24/2016

## 2016-10-24 ENCOUNTER — Encounter: Payer: Self-pay | Admitting: Family Medicine

## 2016-10-24 ENCOUNTER — Ambulatory Visit (INDEPENDENT_AMBULATORY_CARE_PROVIDER_SITE_OTHER): Payer: Medicare HMO

## 2016-10-24 VITALS — BP 120/60 | HR 75 | Ht 63.0 in | Wt 150.0 lb

## 2016-10-24 DIAGNOSIS — Z1159 Encounter for screening for other viral diseases: Secondary | ICD-10-CM | POA: Diagnosis not present

## 2016-10-24 DIAGNOSIS — Z23 Encounter for immunization: Secondary | ICD-10-CM

## 2016-10-24 DIAGNOSIS — Z0000 Encounter for general adult medical examination without abnormal findings: Secondary | ICD-10-CM | POA: Diagnosis not present

## 2016-10-24 NOTE — Patient Instructions (Addendum)
Nancy Herman , Thank you for taking time to come for your Medicare Wellness Visit. I appreciate your ongoing commitment to your health goals. Please review the following plan we discussed and let me know if I can assist you in the future.   Please check with your office to request dates of any pneumonia vaccines or tetanus vaccinations you may have had.  Will try to complete AD; Given copy  Referred to [facility] for questions [place] offers free advance directive forms, as well as assistance in completing the forms themselves. For assistance, contact the Spiritual Care Department at [phone], or the Clinical Social Work Department at [phone].  A Tetanus is recommended every 10 years. Medicare covers a tetanus if you have a cut or wound; otherwise, there may be a charge. If you had not had a tetanus with pertusses, known as the Tdap, you can take this anytime.     Prevention of falls: Remove rugs or any tripping hazards in the home Use Non slip mats in bathtubs and showers Placing grab bars next to the toilet and or shower Placing handrails on both sides of the stair way Adding extra lighting in the home.   Personal safety issues reviewed:  1. Consider starting a community watch program per  [employer] 2.  Changes batteries is smoke detector and/or carbon monoxide detector  3.  If you have firearms; keep them in a safe place 4.  Wear protection when in the sun; Always wear sunscreen or a hat; It is good to have your doctor check your skin annually or review any new areas of concern 5. Driving safety; Keep in the right lane; stay 3 car lengths behind the car in front of you on the highway; look 3 times prior to pulling out; carry your cell phone everywhere you go!    Learn about the Yellow Dot program:  The program allows first responders at your emergency to have access to who your physician is, as well as your medications and medical conditions.  Citizens  requesting the Yellow Dot Packages should contact Master Corporal Kevin Wallace at the [place] [employer] [phone] for the first week of the program and beginning the week after Easter citizens should contact their local law enforcement agency.       These are the goals we discussed: Goals    . Exercise 150 minutes per week (moderate activity)          Will start walking; walks at the mall at 9:30        This is a list of the screening recommended for you and due dates:  Health Maintenance  Topic Date Due  .  Hepatitis C: One time screening is recommended by Center for Disease Control  (CDC) for  adults born from 1945 through 1965.   10/25/1945  . Tetanus Vaccine  08/20/1966  . Colon Cancer Screening  07/07/2005  . DEXA scan (bone density measurement)  08/19/2012  . Pneumonia vaccines (1 of 2 - PCV13) 08/19/2012  . Flu Shot  09/04/2016  . Mammogram  09/10/2018        Fall Prevention in the Home Falls can cause injuries. They can happen to people of all ages. There are many things you can do to make your home safe and to help prevent falls. What can I do on the outside of my home?  Regularly fix the edges of walkways and driveways and fix any cracks.  Remove anything that might make you   trip as you walk through a door, such as a raised step or threshold.  Trim any bushes or trees on the path to your home.  Use bright outdoor lighting.  Clear any walking paths of anything that might make someone trip, such as rocks or tools.  Regularly check to see if handrails are loose or broken. Make sure that both sides of any steps have handrails.  Any raised decks and porches should have guardrails on the edges.  Have any leaves, snow, or ice cleared regularly.  Use sand or salt on walking paths during winter.  Clean up any spills in your garage right away. This includes oil or grease spills. What can I do in the bathroom?  Use night lights.  Install  grab bars by the toilet and in the tub and shower. Do not use towel bars as grab bars.  Use non-skid mats or decals in the tub or shower.  If you need to sit down in the shower, use a plastic, non-slip stool.  Keep the floor dry. Clean up any water that spills on the floor as soon as it happens.  Remove soap buildup in the tub or shower regularly.  Attach bath mats securely with double-sided non-slip rug tape.  Do not have throw rugs and other things on the floor that can make you trip. What can I do in the bedroom?  Use night lights.  Make sure that you have a light by your bed that is easy to reach.  Do not use any sheets or blankets that are too big for your bed. They should not hang down onto the floor.  Have a firm chair that has side arms. You can use this for support while you get dressed.  Do not have throw rugs and other things on the floor that can make you trip. What can I do in the kitchen?  Clean up any spills right away.  Avoid walking on wet floors.  Keep items that you use a lot in easy-to-reach places.  If you need to reach something above you, use a strong step stool that has a grab bar.  Keep electrical cords out of the way.  Do not use floor polish or wax that makes floors slippery. If you must use wax, use non-skid floor wax.  Do not have throw rugs and other things on the floor that can make you trip. What can I do with my stairs?  Do not leave any items on the stairs.  Make sure that there are handrails on both sides of the stairs and use them. Fix handrails that are broken or loose. Make sure that handrails are as long as the stairways.  Check any carpeting to make sure that it is firmly attached to the stairs. Fix any carpet that is loose or worn.  Avoid having throw rugs at the top or bottom of the stairs. If you do have throw rugs, attach them to the floor with carpet tape.  Make sure that you have a light switch at the top of the stairs and  the bottom of the stairs. If you do not have them, ask someone to add them for you. What else can I do to help prevent falls?  Wear shoes that: ? Do not have high heels. ? Have rubber bottoms. ? Are comfortable and fit you well. ? Are closed at the toe. Do not wear sandals.  If you use a stepladder: ? Make sure that it is fully   opened. Do not climb a closed stepladder. ? Make sure that both sides of the stepladder are locked into place. ? Ask someone to hold it for you, if possible.  Clearly mark and make sure that you can see: ? Any grab bars or handrails. ? First and last steps. ? Where the edge of each step is.  Use tools that help you move around (mobility aids) if they are needed. These include: ? Canes. ? Walkers. ? Scooters. ? Crutches.  Turn on the lights when you go into a dark area. Replace any light bulbs as soon as they burn out.  Set up your furniture so you have a clear path. Avoid moving your furniture around.  If any of your floors are uneven, fix them.  If there are any pets around you, be aware of where they are.  Review your medicines with your doctor. Some medicines can make you feel dizzy. This can increase your chance of falling. Ask your doctor what other things that you can do to help prevent falls. This information is not intended to replace advice given to you by your health care provider. Make sure you discuss any questions you have with your health care provider. Document Released: 11/17/2008 Document Revised: 06/29/2015 Document Reviewed: 02/25/2014 Elsevier Interactive Patient Education  2018 [place] Maintenance, Female Adopting a healthy lifestyle and getting preventive care can go a long way to promote health and wellness. Talk with your health care provider about what schedule of regular examinations is right for you. This is a good chance for you to check in with your provider about disease prevention and staying healthy. In  between checkups, there are plenty of things you can do on your own. Experts have done a lot of research about which lifestyle changes and preventive measures are most likely to keep you healthy. Ask your health care provider for more information. Weight and diet Eat a healthy diet  Be sure to include plenty of vegetables, fruits, low-fat dairy products, and lean protein.  Do not eat a lot of foods high in solid fats, added sugars, or salt.  Get regular exercise. This is one of the most important things you can do for your health. ? Most adults should exercise for at least 150 minutes each week. The exercise should increase your heart rate and make you sweat (moderate-intensity exercise). ? Most adults should also do strengthening exercises at least twice a week. This is in addition to the moderate-intensity exercise.  Maintain a healthy weight  Body mass index (BMI) is a measurement that can be used to identify possible weight problems. It estimates body fat based on height and weight. Your health care provider can help determine your BMI and help you achieve or maintain a healthy weight.  For females 88 years of age and older: ? A BMI below 18.5 is considered underweight. ? A BMI of 18.5 to 24.9 is normal. ? A BMI of 25 to 29.9 is considered overweight. ? A BMI of 30 and above is considered obese.  Watch levels of cholesterol and blood lipids  You should start having your blood tested for lipids and cholesterol at 69 years of age, then have this test every 5 years.  You may need to have your cholesterol levels checked more often if: ? Your lipid or cholesterol levels are high. ? You are older than 69 years of age. ? You are at high risk for heart disease.  Cancer screening Lung Cancer  Lung cancer screening is recommended for adults 55-80 years old who are at high risk for lung cancer because of a history of smoking.  A yearly low-dose CT scan of the lungs is recommended for  people who: ? Currently smoke. ? Have quit within the past 15 years. ? Have at least a 30-pack-year history of smoking. A pack year is smoking an average of one pack of cigarettes a day for 1 year.  Yearly screening should continue until it has been 15 years since you quit.  Yearly screening should stop if you develop a health problem that would prevent you from having lung cancer treatment.  Breast Cancer  Practice breast self-awareness. This means understanding how your breasts normally appear and feel.  It also means doing regular breast self-exams. Let your health care provider know about any changes, no matter how small.  If you are in your 20s or 30s, you should have a clinical breast exam (CBE) by a health care provider every 1-3 years as part of a regular health exam.  If you are 40 or older, have a CBE every year. Also consider having a breast X-ray (mammogram) every year.  If you have a family history of breast cancer, talk to your health care provider about genetic screening.  If you are at high risk for breast cancer, talk to your health care provider about having an MRI and a mammogram every year.  Breast cancer gene (BRCA) assessment is recommended for women who have family members with BRCA-related cancers. BRCA-related cancers include: ? Breast. ? Ovarian. ? Tubal. ? Peritoneal cancers.  Results of the assessment will determine the need for genetic counseling and BRCA1 and BRCA2 testing.  Cervical Cancer Your health care provider may recommend that you be screened regularly for cancer of the pelvic organs (ovaries, uterus, and vagina). This screening involves a pelvic examination, including checking for microscopic changes to the surface of your cervix (Pap test). You may be encouraged to have this screening done every 3 years, beginning at age 21.  For women ages 30-65, health care providers may recommend pelvic exams and Pap testing every 3 years, or they may  recommend the Pap and pelvic exam, combined with testing for human papilloma virus (HPV), every 5 years. Some types of HPV increase your risk of cervical cancer. Testing for HPV may also be done on women of any age with unclear Pap test results.  Other health care providers may not recommend any screening for nonpregnant women who are considered low risk for pelvic cancer and who do not have symptoms. Ask your health care provider if a screening pelvic exam is right for you.  If you have had past treatment for cervical cancer or a condition that could lead to cancer, you need Pap tests and screening for cancer for at least 20 years after your treatment. If Pap tests have been discontinued, your risk factors (such as having a new sexual partner) need to be reassessed to determine if screening should resume. Some women have medical problems that increase the chance of getting cervical cancer. In these cases, your health care provider may recommend more frequent screening and Pap tests.  Colorectal Cancer  This type of cancer can be detected and often prevented.  Routine colorectal cancer screening usually begins at 69 years of age and continues through 69 years of age.  Your health care provider may recommend screening at an earlier age if you have risk factors for colon cancer.  Your   health care provider may also recommend using home test kits to check for hidden blood in the stool.  A small camera at the end of a tube can be used to examine your colon directly (sigmoidoscopy or colonoscopy). This is done to check for the earliest forms of colorectal cancer.  Routine screening usually begins at age 14.  Direct examination of the colon should be repeated every 5-10 years through 69 years of age. However, you may need to be screened more often if early forms of precancerous polyps or small growths are found.  Skin Cancer  Check your skin from head to toe regularly.  Tell your health care  provider about any new moles or changes in moles, especially if there is a change in a mole's shape or color.  Also tell your health care provider if you have a mole that is larger than the size of a pencil eraser.  Always use sunscreen. Apply sunscreen liberally and repeatedly throughout the day.  Protect yourself by wearing long sleeves, pants, a wide-brimmed hat, and sunglasses whenever you are outside.  Heart disease, diabetes, and high blood pressure  High blood pressure causes heart disease and increases the risk of stroke. High blood pressure is more likely to develop in: ? People who have blood pressure in the high end of the normal range (130-139/85-89 mm Hg). ? People who are overweight or obese. ? People who are African American.  If you are 47-46 years of age, have your blood pressure checked every 3-5 years. If you are 42 years of age or older, have your blood pressure checked every year. You should have your blood pressure measured twice-once when you are at a hospital or clinic, and once when you are not at a hospital or clinic. Record the average of the two measurements. To check your blood pressure when you are not at a hospital or clinic, you can use: ? An automated blood pressure machine at a pharmacy. ? A home blood pressure monitor.  If you are between 54 years and 42 years old, ask your health care provider if you should take aspirin to prevent strokes.  Have regular diabetes screenings. This involves taking a blood sample to check your fasting blood sugar level. ? If you are at a normal weight and have a low risk for diabetes, have this test once every three years after 69 years of age. ? If you are overweight and have a high risk for diabetes, consider being tested at a younger age or more often. Preventing infection Hepatitis B  If you have a higher risk for hepatitis B, you should be screened for this virus. You are considered at high risk for hepatitis B  if: ? You were born in a country where hepatitis B is common. Ask your health care provider which countries are considered high risk. ? Your parents were born in a high-risk country, and you have not been immunized against hepatitis B (hepatitis B vaccine). ? You have HIV or AIDS. ? You use needles to inject street drugs. ? You live with someone who has hepatitis B. ? You have had sex with someone who has hepatitis B. ? You get hemodialysis treatment. ? You take certain medicines for conditions, including cancer, organ transplantation, and autoimmune conditions.  Hepatitis C  Blood testing is recommended for: ? Everyone born from [birth year] through 1963. ? Anyone with known risk factors for hepatitis C.  Sexually transmitted infections (STIs)  You should be screened for  sexually transmitted infections (STIs) including gonorrhea and chlamydia if: ? You are sexually active and are younger than 69 years of age. ? You are older than 69 years of age and your health care provider tells you that you are at risk for this type of infection. ? Your sexual activity has changed since you were last screened and you are at an increased risk for chlamydia or gonorrhea. Ask your health care provider if you are at risk.  If you do not have HIV, but are at risk, it may be recommended that you take a prescription medicine daily to prevent HIV infection. This is called pre-exposure prophylaxis (PrEP). You are considered at risk if: ? You are sexually active and do not regularly use condoms or know the HIV status of your partner(s). ? You take drugs by injection. ? You are sexually active with a partner who has HIV.  Talk with your health care provider about whether you are at high risk of being infected with HIV. If you choose to begin PrEP, you should first be tested for HIV. You should then be tested every 3 months for as long as you are taking PrEP. Pregnancy  If you are premenopausal and you may become  pregnant, ask your health care provider about preconception counseling.  If you may become pregnant, take 400 to 800 micrograms (mcg) of folic acid every day.  If you want to prevent pregnancy, talk to your health care provider about birth control (contraception). Osteoporosis and menopause  Osteoporosis is a disease in which the bones lose minerals and strength with aging. This can result in serious bone fractures. Your risk for osteoporosis can be identified using a bone density scan.  If you are 33 years of age or older, or if you are at risk for osteoporosis and fractures, ask your health care provider if you should be screened.  Ask your health care provider whether you should take a calcium or vitamin D supplement to lower your risk for osteoporosis.  Menopause may have certain physical symptoms and risks.  Hormone replacement therapy may reduce some of these symptoms and risks. Talk to your health care provider about whether hormone replacement therapy is right for you. Follow these instructions at home:  Schedule regular health, dental, and eye exams.  Stay current with your immunizations.  Do not use any tobacco products including cigarettes, chewing tobacco, or electronic cigarettes.  If you are pregnant, do not drink alcohol.  If you are breastfeeding, limit how much and how often you drink alcohol.  Limit alcohol intake to no more than 1 drink per day for nonpregnant women. One drink equals 12 ounces of beer, 5 ounces of wine, or 1 ounces of hard liquor.  Do not use street drugs.  Do not share needles.  Ask your health care provider for help if you need support or information about quitting drugs.  Tell your health care provider if you often feel depressed.  Tell your health care provider if you have ever been abused or do not feel safe at home. This information is not intended to replace advice given to you by your health care provider. Make sure you discuss any  questions you have with your health care provider. Document Released: 08/06/2010 Document Revised: 06/29/2015 Document Reviewed: 10/25/2014 Elsevier Interactive Patient Education  [employer].

## 2016-10-24 NOTE — Progress Notes (Signed)
Signed in PCP absence. Manuela Schwartz, can you discuss colon cancer screening with Dr. Martinique to see what she would like to do? Thanks. Colin Benton R., DO

## 2016-10-25 ENCOUNTER — Telehealth: Payer: Self-pay

## 2016-10-25 NOTE — Telephone Encounter (Signed)
The patient came in for her AWV. States she had fup for colonoscopy with Dr. Collene Mares. Call to Dr. Lorie Apley office for fup and they denied seeing her  Please advise recommendation for fup with GI or other for colonoscopy. Felt she had one in 2016? She states we should have the records from Dr. Albertine Grates in [facility] but I do not see them in epic   Tks for recommendation for fup

## 2016-10-28 ENCOUNTER — Ambulatory Visit (INDEPENDENT_AMBULATORY_CARE_PROVIDER_SITE_OTHER): Payer: Medicare HMO | Admitting: Pulmonary Disease

## 2016-10-28 ENCOUNTER — Encounter: Payer: Self-pay | Admitting: Pulmonary Disease

## 2016-10-28 VITALS — BP 128/66 | HR 84 | Ht 63.0 in | Wt 150.0 lb

## 2016-10-28 DIAGNOSIS — J309 Allergic rhinitis, unspecified: Secondary | ICD-10-CM | POA: Diagnosis not present

## 2016-10-28 DIAGNOSIS — K219 Gastro-esophageal reflux disease without esophagitis: Secondary | ICD-10-CM | POA: Diagnosis not present

## 2016-10-28 DIAGNOSIS — R05 Cough: Secondary | ICD-10-CM | POA: Diagnosis not present

## 2016-10-28 DIAGNOSIS — R059 Cough, unspecified: Secondary | ICD-10-CM

## 2016-10-28 NOTE — Patient Instructions (Signed)
For allergic rhinitis: Take 1 cetirizine tablet a day Take 2 sprays of fluticasone nasal spray in each nostril every day no matter how you feel  For vasomotor rhinitis: This is the type of runny nose where it feels like your nose is running like a faucet, typically its worse after meals. > use ipratropium nose spray for this as needed  For GERD/Acid reflux: > continue taking Omeprazole daily  We will see you back if your cough doesn't resolve with the above listed treatment

## 2016-10-28 NOTE — Progress Notes (Signed)
Subjective:    Patient ID: Nancy Herman, female    DOB: 19-Feb-1945, 71 y.o.   MRN: [id number]  Synopsis: Referred in February 2018 for cough in the setting of postnasal drip and acid reflux. She has suffered from a daily cough at least since Christmas 2017. She has a hiatal hernia.   HPI Chief Complaint  Patient presents with  . Follow-up    pt c/o sinus congestion, pnd X1 month.  denies prod cough at this time.    [name] says that she is coughing again, its associated with increasing sinus congestion and runny nose right now.  Recently she was out working I the yard and she had these symptoms and a scratchy sensation in her throat.    She is currently not taking anything for allergy in a form of the nose spray. When I took a history today regarding her medications she is quite confused about what she is taking.   Past Medical History:  Diagnosis Date  . Allergy   . Anxiety   . Dyslipidemia   . Frequent headaches   . GERD (gastroesophageal reflux disease)   . Hyperlipidemia   . IBS (irritable bowel syndrome)   . 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 Thyroid disease   . Urine incontinence       Review of Systems  Constitutional: Negative for chills, fatigue and fever.  HENT: Positive for postnasal drip. Negative for sinus pain and sinus pressure.   Respiratory: Positive for cough. Negative for shortness of breath and wheezing.   Cardiovascular: Negative for chest pain, palpitations and leg swelling.       Objective:   Physical Exam  Vitals:   10/28/16 0933  BP: 128/66  Pulse: 84  SpO2: 100%  Weight: 150 lb (68 kg)  Height: 5\' 3"  (1.6 m)     Gen: well appearing HENT: OP clear, TM's clear, neck supple PULM: CTA B, normal percussion CV: RRR, no mgr, trace edema GI: BS+, soft, nontender Derm: no cyanosis or rash Psyche: normal mood and affect   Chest imaging: 03/2016 CXR: no evidence of lung disease 2001 Esophogram: 1.SMALL HIATAL HERNIA WITH NO  DEMONSTRABLE REFLUX AND NO DIFFICULTY SWALLOWING A BARIUM TABLET. 2.THE REMAINDER OF THE UPPER INTESTINAL TRACT APPEARS NORMAL.  Records from her visit with our nurse practitioner reviewed were she was encouraged to use Flonase for her allergic rhinitis treatment in addition to acid reflux treatment.    Assessment & Plan:  Cough  Gastroesophageal reflux disease, esophagitis presence not specified  Allergic rhinitis, unspecified seasonality, unspecified trigger  Discussion: [name] is here to follow-up in regards to her chronic refractory cough again. She continues to struggle with cough, but this is because she has allergic rhinitis which is poorly controled because she is not taking any medicine for it right now. Today I counseled her extensively on the purposes of her medications and talk to her about the importance of strict adherence to her medication regimen. She remains quite confused about her medicines. We gave her both verbal and typed instructions that explicitly detailed how she supposed to take her medications for allergic rhinitis and acid reflux.  If she still has cough despite taking these medications   Plan: For allergic rhinitis: Take 1 cetirizine tablet a day Take 2 sprays of fluticasone nasal spray in each nostril every day no matter how you feel  For vasomotor rhinitis: This is the type of runny nose where it feels like your nose is running like a  faucet, typically its worse after meals. > use ipratropium nose spray for this as needed  For GERD/Acid reflux: > continue taking Omeprazole daily  We will see you back if your cough doesn't resolve with the above listed treatment   Current Outpatient Prescriptions:  .  famotidine (PEPCID) 20 MG tablet, Take 20 mg by mouth daily. 1 tab twice daily, Disp: , Rfl:  .  FLUoxetine (PROZAC) 20 MG capsule, TAKE ONE CAPSULE BY MOUTH ONCE DAILY, Disp: 90 capsule, Rfl: 1 .  ipratropium (ATROVENT) 0.03 % nasal spray, 2 sprays in  each nostril twice a day., Disp: 30 mL, Rfl: 3 .  levothyroxine (SYNTHROID, LEVOTHROID) 50 MCG tablet, Take 1 tablet (50 mcg total) by mouth daily before breakfast., Disp: 90 tablet, Rfl: 3 .  omeprazole (PRILOSEC) 20 MG capsule, Take 1 capsule (20 mg total) by mouth 2 (two) times daily before a meal., Disp: 60 capsule, Rfl: 3 .  pravastatin (PRAVACHOL) 10 MG tablet, Take 1 tablet (10 mg total) by mouth daily., Disp: 90 tablet, Rfl: 1 .  Probiotic Product (PROBIOTIC-10 PO), Take by mouth., Disp: , Rfl:

## 2016-10-28 NOTE — Telephone Encounter (Signed)
So it seems like she has not had colon cancer screening done. So please let her know that referral to GI can be placed for screening colonoscopy. She may have a preference in regard to provider. Thanks, BJ

## 2016-10-29 ENCOUNTER — Encounter: Payer: Self-pay | Admitting: Family Medicine

## 2016-10-29 ENCOUNTER — Other Ambulatory Visit: Payer: Self-pay

## 2016-10-29 ENCOUNTER — Ambulatory Visit
Admission: RE | Admit: 2016-10-29 | Discharge: 2016-10-29 | Disposition: A | Discharge status: Home / Self Care | Payer: Medicare HMO | Source: Ambulatory Visit | Attending: Family Medicine | Admitting: Family Medicine

## 2016-10-29 DIAGNOSIS — E2839 Other primary ovarian failure: Secondary | ICD-10-CM

## 2016-10-29 MED ORDER — PRAVASTATIN SODIUM 20 MG PO TABS: 20.0000 mg | ORAL_TABLET | Freq: Every day | ORAL | 1 refills | Status: DC

## 2016-10-29 NOTE — Telephone Encounter (Signed)
I spoke with patient. We went over her bone density results. Patient had her last colonoscopy on 03/31/2014. Dr. Collene Mares wanted a repeat colonoscopy in 3 years. Patient is due for the repeat in 03/2017. Nothing further needed & patient in agreement with plan.

## 2016-10-29 NOTE — Telephone Encounter (Signed)
I left a voicemail for patient to return my call.

## 2016-11-06 ENCOUNTER — Ambulatory Visit (INDEPENDENT_AMBULATORY_CARE_PROVIDER_SITE_OTHER): Payer: Medicare HMO | Admitting: Family Medicine

## 2016-11-06 ENCOUNTER — Telehealth: Payer: Self-pay | Admitting: *Deleted

## 2016-11-06 ENCOUNTER — Encounter: Payer: Self-pay | Admitting: Family Medicine

## 2016-11-06 VITALS — BP 120/70 | HR 73 | Resp 12 | Ht 63.0 in | Wt 150.2 lb

## 2016-11-06 DIAGNOSIS — R519 Headache, unspecified: Secondary | ICD-10-CM

## 2016-11-06 DIAGNOSIS — T1490XA Injury, unspecified, initial encounter: Secondary | ICD-10-CM

## 2016-11-06 DIAGNOSIS — W19XXXA Unspecified fall, initial encounter: Secondary | ICD-10-CM | POA: Diagnosis not present

## 2016-11-06 DIAGNOSIS — R51 Headache: Secondary | ICD-10-CM

## 2016-11-06 DIAGNOSIS — M542 Cervicalgia: Secondary | ICD-10-CM | POA: Diagnosis not present

## 2016-11-06 MED ORDER — CELECOXIB 100 MG PO CAPS: 100.0000 mg | ORAL_CAPSULE | Freq: Two times a day (BID) | ORAL | 0 refills | Status: AC

## 2016-11-06 MED ORDER — TIZANIDINE HCL 4 MG PO TABS: 2.0000 mg | ORAL_TABLET | Freq: Three times a day (TID) | ORAL | 0 refills | Status: AC | PRN

## 2016-11-06 MED ORDER — KETOROLAC TROMETHAMINE 60 MG/2ML IM SOLN
60.0000 mg | Freq: Once | INTRAMUSCULAR | Status: AC
  Administered 2016-11-06: 60 mg via INTRAMUSCULAR

## 2016-11-06 NOTE — Telephone Encounter (Signed)
I called and spoke with patient. She fell earlier today while she was at [facility]. Patient is having some pain in her breast, left hand & leg. Patient had tried to brace the fall with her left side. The pain is easing up now. I advised patient we could get her in today to make sure that there are no injuries. Patient is agreeable with this plan, and is scheduled to come in at 3:00pm.

## 2016-11-06 NOTE — Telephone Encounter (Signed)
Patient called stating she would like to talk to the nurse about the fall on a concreate floor she encountered today, patient is having pain in her breast and in her right hand and left leg. Patient states the pain is easing off but she would like to discuss with Dr Doug Sou nurse. Please advise [phone]

## 2016-11-06 NOTE — Patient Instructions (Addendum)
A few things to remember from today's visit:   Fall, initial encounter  Cervicalgia - Plan: celecoxib (CELEBREX) 100 MG capsule, tiZANidine (ZANAFLEX) 4 MG tablet, ketorolac (TORADOL) injection 60 mg  Headache, unspecified headache type - Plan: ketorolac (TORADOL) injection 60 mg  Soft tissue injury    Neck Exercises Neck exercises can be important for many reasons:  They can help you to improve and maintain flexibility in your neck. This can be especially important as you age.  They can help to make your neck stronger. This can make movement easier.  They can reduce or prevent neck pain.  They may help your upper back.  Ask your health care provider which neck exercises would be best for you. Exercises Neck Press Repeat this exercise 10 times. Do it first thing in the morning and right before bed or as told by your health care provider. 1. Lie on your back on a firm bed or on the floor with a pillow under your head. 2. Use your neck muscles to push your head down on the pillow and straighten your spine. 3. Hold the position as well as you can. Keep your head facing up and your chin tucked. 4. Slowly count to 5 while holding this position. 5. Relax for a few seconds. Then repeat.  Isometric Strengthening Do a full set of these exercises 2 times a day or as told by your health care provider. 1. Sit in a supportive chair and place your hand on your forehead. 2. Push forward with your head and neck while pushing back with your hand. Hold for 10 seconds. 3. Relax. Then repeat the exercise 3 times. 4. Next, do thesequence again, this time putting your hand against the back of your head. Use your head and neck to push backward against the hand pressure. 5. Finally, do the same exercise on either side of your head, pushing sideways against the pressure of your hand.  Prone Head Lifts Repeat this exercise 5 times. Do this 2 times a day or as told by your health care  provider. 1. Lie face-down, resting on your elbows so that your chest and upper back are raised. 2. Start with your head facing downward, near your chest. Position your chin either on or near your chest. 3. Slowly lift your head upward. Lift until you are looking straight ahead. Then continue lifting your head as far back as you can stretch. 4. Hold your head up for 5 seconds. Then slowly lower it to your starting position.  Supine Head Lifts Repeat this exercise 8-10 times. Do this 2 times a day or as told by your health care provider. 1. Lie on your back, bending your knees to point to the ceiling and keeping your feet flat on the floor. 2. Lift your head slowly off the floor, raising your chin toward your chest. 3. Hold for 5 seconds. 4. Relax and repeat.  Scapular Retraction Repeat this exercise 5 times. Do this 2 times a day or as told by your health care provider. 1. Stand with your arms at your sides. Look straight ahead. 2. Slowly pull both shoulders backward and downward until you feel a stretch between your shoulder blades in your upper back. 3. Hold for 10-30 seconds. 4. Relax and repeat.  Contact a health care provider if:  Your neck pain or discomfort gets much worse when you do an exercise.  Your neck pain or discomfort does not improve within 2 hours after you exercise. If you  have any of these problems, stop exercising right away. Do not do the exercises again unless your health care provider says that you can. Get help right away if:  You develop sudden, severe neck pain. If this happens, stop exercising right away. Do not do the exercises again unless your health care provider says that you can. Exercises Neck Stretch  Repeat this exercise 3-5 times. 1. Do this exercise while standing or while sitting in a chair. 2. Place your feet flat on the floor, shoulder-width apart. 3. Slowly turn your head to the right. Turn it all the way to the right so you can look over  your right shoulder. Do not tilt or tip your head. 4. Hold this position for 10-30 seconds. 5. Slowly turn your head to the left, to look over your left shoulder. 6. Hold this position for 10-30 seconds.  Neck Retraction Repeat this exercise 8-10 times. Do this 3-4 times a day or as told by your health care provider. 1. Do this exercise while standing or while sitting in a sturdy chair. 2. Look straight ahead. Do not bend your neck. 3. Use your fingers to push your chin backward. Do not bend your neck for this movement. Continue to face straight ahead. If you are doing the exercise properly, you will feel a slight sensation in your throat and a stretch at the back of your neck. 4. Hold the stretch for 1-2 seconds. Relax and repeat.  This information is not intended to replace advice given to you by your health care provider. Make sure you discuss any questions you have with your health care provider. Document Released: 01/02/2015 Document Revised: 06/29/2015 Document Reviewed: 08/01/2014 Elsevier Interactive Patient Education  2017 [place] and Fractures  Falls can be very serious, especially for older adults or people with osteoporosis  Falls can be caused by:  Tripping or slipping  Slow reflexes  Balance problems  Reduced muscle strength  Poor vision or a recent change in prescription  Illness and some medications (especially blood pressure pills, diuretics, heart medicines, muscle relaxants and sleep medications)  Drinking alcohol  To prevent falls outdoors:  Use a can or walker if needed  Wear rubber-soled shoes so you don't slip  DO NOT buy "shape up" shoes with rocker bottom soles if you have balance problems.  The thick soles and shape make it more difficult to keep your balance.  Put kitty litter or salt on icy sidewalks  Walk on the grass if the sidewalks are slick  Avoid walking on uneven ground whenever possible  T prevent falls  indoors:  Keep rooms clutter-free, especially hallways, stairs and paths to light switches  Remove throw rugs  Install night lights, especially to and in the bathroom  Turn on lights before going downstairs  Keep a flashlight next to your bed  Buy a cordless phone to keep with you instead of jumping up to answer the phone  Install grab bars in the bathroom near the shower and toilet  Install rails on both sides of the stairs.  Make sure the stairs are well lit  Wear slippers with non-skid soles.  Do not walk around in stockings or socks  Balance problems and dizziness are not a normal part of growing older.  If you begin having balance problems or dizziness see your doctor.  Physical Therapy can help you with many balance problems, strengthening hip and leg muscles and with gait training.  To keep your bones  healthy make sure you are getting enough calcium and Vitamin D each day.  Ask your doctor or pharmacist about supplements.  Regular weight-bearing exercise like walking, lifting weights or dancing can help strengthen bones and prevent osteoporosis.  Please be sure medication list is accurate. If a new problem present, please set up appointment sooner than planned today.

## 2016-11-06 NOTE — Progress Notes (Signed)
ACUTE VISIT   HPI:  Chief Complaint  Patient presents with  . Fall    [name] is a 71 y.o. female, who is here today with her husband complaining of worsening low back pain after fall at church today around 12 noon. She was walking back from the bathroom and tripped with a piece of cardboard  that was on the floor. She landed on left side, denies neck or head trauma. She has some difficulty standing up and required help to do so. She started with left upper extremity pain and left breast pain, these are improving. About 30 minutes ago she started with left cervical pain and headache. Constant tightness feeling on cervical,occipital, and frontal area. Pain is about 5-6/10, it is not radiated to the upper extremities but to occipital area. She denies associated visual changes, nausea, vomiting, or focal weakness.  Pain is exacerbated by cervical movement and alleviated by rest. She has not taking any OTC medication.  She is also concerned about breast tenderness, which started after injury. She has not noted ecchymosis or masses.   Review of Systems  Constitutional: Positive for fatigue. Negative for appetite change and fever.  HENT: Negative for mouth sores, sore throat and trouble swallowing.   Respiratory: Negative for shortness of breath and wheezing.   Cardiovascular: Negative for palpitations and leg swelling.  Gastrointestinal: Negative for abdominal pain, blood in stool, nausea and vomiting.  Genitourinary: Negative for decreased urine volume, dysuria and hematuria.  Musculoskeletal: Positive for back pain and neck pain. Negative for gait problem.  Skin: Negative for rash and wound.  Neurological: Negative for weakness, numbness and headaches.  Psychiatric/Behavioral: Negative for confusion. The patient is nervous/anxious.      Current Outpatient Prescriptions on File Prior to Visit  Medication Sig Dispense Refill  . cetirizine (ZYRTEC) 10 MG  tablet Take 10 mg by mouth daily.    . famotidine (PEPCID) 20 MG tablet Take 20 mg by mouth daily. 1 tab twice daily    . FLUoxetine (PROZAC) 20 MG capsule TAKE ONE CAPSULE BY MOUTH ONCE DAILY 90 capsule 1  . fluticasone (FLONASE) 50 MCG/ACT nasal spray Place 2 sprays into both nostrils daily.    Marland Kitchen ipratropium (ATROVENT) 0.03 % nasal spray 2 sprays in each nostril twice a day. 30 mL 3  . levothyroxine (SYNTHROID, LEVOTHROID) 50 MCG tablet Take 1 tablet (50 mcg total) by mouth daily before breakfast. 90 tablet 3  . omeprazole (PRILOSEC) 20 MG capsule Take 1 capsule (20 mg total) by mouth 2 (two) times daily before a meal. 60 capsule 3  . pravastatin (PRAVACHOL) 20 MG tablet Take 1 tablet (20 mg total) by mouth daily. 90 tablet 1  . Probiotic Product (PROBIOTIC-10 PO) Take by mouth.     No current facility-administered medications on file prior to visit.      Past Medical History:  Diagnosis Date  . Allergy   . Anxiety   . Dyslipidemia   . Frequent headaches   . GERD (gastroesophageal reflux disease)   . Hyperlipidemia   . IBS (irritable bowel syndrome)   . 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 Thyroid disease   . Urine incontinence    No Known Allergies  Social History   Social History  . Marital status: Single    Spouse name: N/A  . Number of children: N/A  . Years of education: N/A   Social History Main Topics  . Smoking status: Never Smoker  .  Smokeless tobacco: Never Used  . Alcohol use No  . Drug use: No  . Sexual activity: No   Other Topics Concern  . None   Social History Narrative  . None    Vitals:   11/06/16 1519  BP: 120/70  Pulse: 73  Resp: 12  SpO2: 98%   Body mass index is 26.62 kg/m.   Physical Exam  Nursing note and vitals reviewed. Constitutional: She is oriented to person, place, and time. She appears well-developed and well-nourished. She does not appear ill. No distress.  HENT:  Head: Normocephalic and atraumatic.  Mouth/Throat:  Oropharynx is clear and moist and mucous membranes are normal.  Eyes: Pupils are equal, round, and reactive to light. Conjunctivae are normal.  Cardiovascular: Normal rate and regular rhythm.   Occasional extrasystoles (X 1 during auscultation) are present.  No murmur heard. Pulses:      Dorsalis pedis pulses are 2+ on the right side, and 2+ on the left side.  Respiratory: Effort normal and breath sounds normal. No respiratory distress.  GI: Soft. She exhibits no mass. There is no tenderness.  Genitourinary: No breast swelling.  Genitourinary Comments: Left breast: No deformity,mild pain upon palpation of outer quadrant.  Musculoskeletal: She exhibits no edema.  Mild tenderness upon palpation of left cervical paraspinal muscles and trapezium. Also mild pain on upper interscapular area. Muscle spasm.  Shoulder with no significant limitation of ROM, no pain elicited. Mild tenderness upon palpation of proximal LUE muscles (deltoid and biceps), no deformities appreciated.  Lymphadenopathy:    She has no cervical adenopathy.  Neurological: She is alert and oriented to person, place, and time. She has normal strength. No cranial nerve deficit. Coordination and gait normal.  Skin: Skin is warm. No ecchymosis and no rash noted. No erythema.  Psychiatric: Her mood appears anxious.  Well groomed, good eye contact.     ASSESSMENT AND PLAN:   Ms. Demetress was seen today for fall.  Diagnoses and all orders for this visit:  Fall, initial encounter  We discussed fall prevention. Some of her medications can also increase the risk of falls.  Cervicalgia  Since she didn't have direct trauma, I don't think imaging is needed at this time. Local heat and massage as well as range motion exercises might help. We discussed some side effects of NSAIDs, after verbal consent she received Toradol 60 mg IM. Instructed to start Celebrex tomorrow. We also discussed some side effects of muscle  relaxants, which can also increase the risk of falls; so she can take at bedtime. Instructed about warning signs.  -     celecoxib (CELEBREX) 100 MG capsule; Take 1 capsule (100 mg total) by mouth 2 (two) times daily. -     tiZANidine (ZANAFLEX) 4 MG tablet; Take 0.5-1 tablets (2-4 mg total) by mouth every 8 (eight) hours as needed for muscle spasms. -     ketorolac (TORADOL) injection 60 mg; Inject 2 mLs (60 mg total) into the muscle once.  Headache, unspecified headache type  Neurologic examination today negative. I explained that it is most likely related to muscle tension.I think brain imaging is needed at this time. Instructed about warning signs.  -     ketorolac (TORADOL) injection 60 mg; Inject 2 mLs (60 mg total) into the muscle once.  Soft tissue injury  Explained that breast and upper extremity muscle tenderness are mostly related to local trauma, I don't think imaging is needed. Explained that she might notice some ecchymosis tomorrow.  Return in about 4 weeks (around 12/04/2016).     -Ms.SHANEDRA LAVE was advised to seek immediate medical attention if sudden worsening symptoms.      Annais Crafts G. [place], MD  [facility]. [place] office.

## 2016-11-08 ENCOUNTER — Telehealth: Payer: Self-pay | Admitting: Family Medicine

## 2016-11-08 NOTE — Telephone Encounter (Signed)
FYI pt no longer uses [facility]. Pt new pharm [facility] [address]

## 2016-11-08 NOTE — Telephone Encounter (Signed)
Pharmacy updated & [facility] removed.

## 2016-12-03 NOTE — Progress Notes (Signed)
HPI:   [name] is a 71 y.o. female, who is here today to follow on recent OV.   She was seen on 11/06/16, when she was c/o headache and cervical pain after fall at church.  Zanaflex and Celebrex were recommended as well as PT exercises.  Cervical pain and right hip pain have resolved. ROM back to her baseline, no limitations. Headache has resolved. She tolerated medication well and denies side effects.  Dysphasia: Intermittently, worse with solids and multivitamins (ca++). Alleviated by drinking a sip of waterwhile chewing her food. Also by chewing slowly.  +Dry mouth, which exacerbates dysphagia, OTC problems have not helped.  She follows with Dr Man, GI.She reports recent EGD done and she is due for colonoscopy 03/2017.  She also mentions cough, sometimes productive with clear mucus, stable. She denies wheezing or dyspnea. No exertional chest pain.  Chest CT 03/2015 3 mm left lung nodule. She follows with Dr Lake Bells ,pulmonoogist.   Anxiety and depression: Currently she is on Prozac 20 mg daily, which was started about a year ago. She has tolerated medication well, she wonders if she can stop it. She denies suicidal thoughts. She mentions that during this time of year she usually feels "lonely", she doesn't want to go out and does not feel motivated to do things. Her daughter is coming to town for the holidays. She takes care of her brother, which can sometimes be frustrating.    Review of Systems  Constitutional: Positive for fatigue. Negative for activity change, appetite change, fever and unexpected weight change.  HENT: Positive for trouble swallowing. Negative for mouth sores and nosebleeds.   Eyes: Negative for redness and visual disturbance.  Respiratory: Positive for cough. Negative for shortness of breath and wheezing.   Cardiovascular: Negative for chest pain, palpitations and leg swelling.  Gastrointestinal: Negative for abdominal pain,  nausea and vomiting.       Negative for changes in bowel habits.  Endocrine: Negative for cold intolerance and heat intolerance.  Genitourinary: Negative for decreased urine volume and hematuria.  Musculoskeletal: Negative for gait problem and myalgias.  Skin: Negative for pallor and rash.  Allergic/Immunologic: Positive for environmental allergies.  Neurological: Negative for syncope, weakness and headaches.  Psychiatric/Behavioral: Negative for confusion and suicidal ideas. The patient is nervous/anxious.       Current Outpatient Prescriptions on File Prior to Visit  Medication Sig Dispense Refill  . Calcium Carbonate-Vit D-Min (CALCIUM 1200 PO) Take 1 tablet by mouth daily.    . cetirizine (ZYRTEC) 10 MG tablet Take 10 mg by mouth daily.    . Cholecalciferol (VITAMIN D3) 1000 units CHEW Chew 1 tablet by mouth daily.    . famotidine (PEPCID) 20 MG tablet Take 20 mg by mouth daily. 1 tab twice daily    . FLUoxetine (PROZAC) 20 MG capsule TAKE ONE CAPSULE BY MOUTH ONCE DAILY 90 capsule 1  . fluticasone (FLONASE) 50 MCG/ACT nasal spray Place 2 sprays into both nostrils daily.    Marland Kitchen ipratropium (ATROVENT) 0.03 % nasal spray 2 sprays in each nostril twice a day. 30 mL 3  . levothyroxine (SYNTHROID, LEVOTHROID) 50 MCG tablet Take 1 tablet (50 mcg total) by mouth daily before breakfast. 90 tablet 3  . omeprazole (PRILOSEC) 20 MG capsule Take 1 capsule (20 mg total) by mouth 2 (two) times daily before a meal. 60 capsule 3  . pravastatin (PRAVACHOL) 20 MG tablet Take 1 tablet (20 mg total) by mouth daily. 90 tablet  1  . Probiotic Product (PROBIOTIC-10 PO) Take by mouth.     No current facility-administered medications on file prior to visit.      Past Medical History:  Diagnosis Date  . Allergy   . Anxiety   . Dyslipidemia   . Frequent headaches   . GERD (gastroesophageal reflux disease)   . Hyperlipidemia   . IBS (irritable bowel syndrome)   . 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  Thyroid disease   . Urine incontinence    No Known Allergies  Social History   Social History  . Marital status: Single    Spouse name: N/A  . Number of children: N/A  . Years of education: N/A   Social History Main Topics  . Smoking status: Never Smoker  . Smokeless tobacco: Never Used  . Alcohol use No  . Drug use: No  . Sexual activity: No   Other Topics Concern  . None   Social History Narrative  . None    Vitals:   12/04/16 0901  BP: 118/70  Pulse: 98  Resp: 12  SpO2: 97%   Body mass index is 26.57 kg/m.   Physical Exam  Nursing note and vitals reviewed. Constitutional: She is oriented to person, place, and time. She appears well-developed and well-nourished. No distress.  HENT:  Head: Normocephalic and atraumatic.  Mouth/Throat: Oropharynx is clear and moist and mucous membranes are normal.  Eyes: Pupils are equal, round, and reactive to light. Conjunctivae are normal.  Cardiovascular: Normal rate and regular rhythm.   No murmur heard. Pulses:      Dorsalis pedis pulses are 2+ on the right side, and 2+ on the left side.  Respiratory: Effort normal and breath sounds normal. No respiratory distress.  GI: Soft. She exhibits no mass. There is no hepatomegaly. There is no tenderness.  Musculoskeletal: She exhibits no edema or tenderness.       Cervical back: She exhibits normal range of motion, no tenderness and no bony tenderness.       Thoracic back: She exhibits no tenderness and no bony tenderness.  Lymphadenopathy:    She has no cervical adenopathy.  Neurological: She is alert and oriented to person, place, and time. She has normal strength. Coordination and gait normal.  Skin: Skin is warm. No rash noted. No erythema.  Psychiatric: Her mood appears anxious.  Well groomed, good eye contact.      ASSESSMENT AND PLAN:   Ms. Nancy Herman was seen today for follow-up.  Diagnoses and all orders for this visit:  Soft tissue injury  She has not  had a fall since her last visit. Musculoskeletal pain she had after fall has resolved. Fall prevention discussed. Follow-up as needed.  Dysphagia, unspecified type  We discussed possible etiologies. Continue following with GI. Chew food slowly with a sip of water, which seems to help. No changes on PPI.Marland Kitchen Review her medications, some of them can exacerbate dry mouth. Instructed about warning signs.  Depressive disorder  Improved with treatment. Season changes may be exacerbating problem.She doesn't want to increase dose of Prozac, sop no changes for now. Recommended getting more involved with church or volunteering or any other social gathering/activity.  Instructed about warning signs. Follow-up in 6 months, before if needed.  Generalized anxiety disorder  Overall he has improved after she started Prozac. No changes in current management. Follow-up in 6 months, before if needed.  Cough  Stable. Possible causes discussed: Allergies, GERD. We discussed chest CT findings. She has  no risk factors for lung malignancy and given the fact lung nodule was very small, follow-up was not recommended. Also she is not interested in further workup at this time since cough has been stable. Instructed about warning signs      Katherene Dinino G. Martinique, MD  [facility]. [place] office.

## 2016-12-04 ENCOUNTER — Ambulatory Visit (INDEPENDENT_AMBULATORY_CARE_PROVIDER_SITE_OTHER): Payer: Medicare HMO | Admitting: Family Medicine

## 2016-12-04 ENCOUNTER — Encounter: Payer: Self-pay | Admitting: Family Medicine

## 2016-12-04 VITALS — BP 118/70 | HR 98 | Resp 12 | Ht 63.0 in | Wt 150.0 lb

## 2016-12-04 DIAGNOSIS — F329 Major depressive disorder, single episode, unspecified: Secondary | ICD-10-CM | POA: Diagnosis not present

## 2016-12-04 DIAGNOSIS — F32A Depression, unspecified: Secondary | ICD-10-CM

## 2016-12-04 DIAGNOSIS — Z8739 Personal history of other diseases of the musculoskeletal system and connective tissue: Secondary | ICD-10-CM

## 2016-12-04 DIAGNOSIS — R05 Cough: Secondary | ICD-10-CM | POA: Diagnosis not present

## 2016-12-04 DIAGNOSIS — T1490XA Injury, unspecified, initial encounter: Secondary | ICD-10-CM

## 2016-12-04 DIAGNOSIS — R1310 Dysphagia, unspecified: Secondary | ICD-10-CM | POA: Insufficient documentation

## 2016-12-04 DIAGNOSIS — F411 Generalized anxiety disorder: Secondary | ICD-10-CM

## 2016-12-04 DIAGNOSIS — W19XXXD Unspecified fall, subsequent encounter: Secondary | ICD-10-CM

## 2016-12-04 DIAGNOSIS — R059 Cough, unspecified: Secondary | ICD-10-CM

## 2016-12-04 NOTE — Patient Instructions (Signed)
A few things to remember from today's visit:   Depressive disorder  Generalized anxiety disorder  Dysphagia, unspecified type  Cough   [name], today we have followed on some of your chronic medical problems and they seem to be stable, so no changes in current management today.  Review medication list and be sure it is accurate.  -Remember a healthy diet and regular physical activity are very important for prevention as well as for well being; they also help with many chronic problems, decreasing the need of adding new medications and delaying or preventing possible complications.  I will see you back in 6 months.  Remember to arrange your follow up appt before leaving today.  Please follow sooner than planned if a new concern arises.  Please be sure medication list is accurate. If a new problem present, please set up appointment sooner than planned today.

## 2016-12-30 ENCOUNTER — Other Ambulatory Visit: Payer: Self-pay | Admitting: Endocrinology

## 2017-01-11 ENCOUNTER — Other Ambulatory Visit: Payer: Self-pay

## 2017-01-11 ENCOUNTER — Encounter (HOSPITAL_COMMUNITY): Payer: Self-pay | Admitting: Emergency Medicine

## 2017-01-11 ENCOUNTER — Ambulatory Visit (HOSPITAL_COMMUNITY)
Admission: EM | Admit: 2017-01-11 | Discharge: 2017-01-11 | Disposition: A | Discharge status: Home / Self Care | Payer: Medicare HMO | Attending: Physician Assistant | Admitting: Physician Assistant

## 2017-01-11 DIAGNOSIS — R05 Cough: Secondary | ICD-10-CM | POA: Diagnosis not present

## 2017-01-11 DIAGNOSIS — J069 Acute upper respiratory infection, unspecified: Secondary | ICD-10-CM | POA: Diagnosis not present

## 2017-01-11 DIAGNOSIS — R059 Cough, unspecified: Secondary | ICD-10-CM

## 2017-01-11 MED ORDER — ALBUTEROL SULFATE HFA 108 (90 BASE) MCG/ACT IN AERS: 2.0000 | INHALATION_SPRAY | Freq: Four times a day (QID) | RESPIRATORY_TRACT | 2 refills | Status: DC | PRN

## 2017-01-11 MED ORDER — AZITHROMYCIN 250 MG PO TABS: ORAL_TABLET | ORAL | 0 refills | Status: DC

## 2017-01-11 NOTE — Discharge Instructions (Signed)
You have a mild respiratory infection. Treat with antibiotics as directed. May take the cough tablets as needed. Suggest you use the inhaler every 6 hours while awake x 2 days then as needed. This will help with wheeze and congestion. Drink plenty of fluids. FU if worsen or with PCP. Nice to meet you and feel better.

## 2017-01-11 NOTE — ED Triage Notes (Signed)
Pt reports a sore throat that started on Wednesday that produced into a productive cough and headache. Pt states she has been taking throat lozenges, cough drops and Benzonatate.

## 2017-01-11 NOTE — ED Provider Notes (Signed)
[facility]    CSN: [id number] Arrival date & time: 01/11/17  1200     History   Chief Complaint Chief Complaint  Patient presents with  . URI    HPI Nancy Herman is a 71 y.o. female.   Who presents with a 5 day history of chest congestion, wheeze and fatigue. It initially started as soreness of her throat but progressed into a productive (yellow) cough. No known fevers. Some chest tightness with wheezing is noted. No history of underlying respiratory condition. Non-smoker.       Past Medical History:  Diagnosis Date  . Allergy   . Anxiety   . Dyslipidemia   . Frequent headaches   . GERD (gastroesophageal reflux disease)   . Hyperlipidemia   . IBS (irritable bowel syndrome)   . 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 Thyroid disease   . Urine incontinence     Patient Active Problem List   Diagnosis Date Noted  . Dysphagia 12/04/2016  . Primary osteoarthritis of both knees 06/03/2016  . Depressive disorder 05/21/2016  . Cough 03/28/2016  . 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 GERD (gastroesophageal reflux disease) 11/06/2015  . IBS (irritable bowel syndrome) 10/16/2015  . Hyperlipidemia 10/16/2015  . Insomnia disorder 10/16/2015  . Anxiety disorder 10/16/2015  . Allergic rhinitis 10/16/2015  . Pituitary adenoma ([place]) 09/26/2015    Past Surgical History:  Procedure Laterality Date  . ABDOMINAL HYSTERECTOMY  1990  . BRAIN SURGERY    . BREAST EXCISIONAL BIOPSY Right unsure  . BREAST EXCISIONAL BIOPSY Left unsure  . OVARIAN CYST SURGERY  1994  . REPAIR RECTOCELE  2016   and prolapsed uterus.  . TONSILLECTOMY  1973    OB History    No data available       Home Medications    Prior to Admission medications   Medication Sig Start Date End Date Taking? Authorizing Provider  Calcium Carbonate-Vit D-Min (CALCIUM 1200 PO) Take 1 tablet by mouth daily.   Yes [provider]  cetirizine (ZYRTEC) 10 MG tablet Take 10 mg by  mouth daily.   Yes [provider]  Cholecalciferol (VITAMIN D3) 1000 units CHEW Chew 1 tablet by mouth daily.   Yes [provider]  famotidine (PEPCID) 20 MG tablet Take 20 mg by mouth daily. 1 tab twice daily   Yes [provider]  FLUoxetine (PROZAC) 20 MG capsule TAKE ONE CAPSULE BY MOUTH ONCE DAILY 09/12/16  Yes Martinique, Betty G, MD  fluticasone [facility]) 50 MCG/ACT nasal spray Place 2 sprays into both nostrils daily.   Yes [provider]  ipratropium (ATROVENT) 0.03 % nasal spray 2 sprays in each nostril twice a day. 03/07/16  Yes Martinique, Betty G, MD  levothyroxine (SYNTHROID, LEVOTHROID) 50 MCG tablet TAKE ONE TABLET BY MOUTH ONCE DAILY BEFORE BREAKFAST 12/30/16  Yes Renato Shin, MD  omeprazole (PRILOSEC) 20 MG capsule Take 1 capsule (20 mg total) by mouth 2 (two) times daily before a meal. 09/04/16  Yes Martinique, Betty G, MD  pravastatin (PRAVACHOL) 20 MG tablet Take 1 tablet (20 mg total) by mouth daily. 10/29/16  Yes Martinique, Betty G, MD  Probiotic Product (PROBIOTIC-10 PO) Take by mouth.   Yes [provider]  albuterol (PROVENTIL HFA;VENTOLIN HFA) 108 (90 Base) MCG/ACT inhaler Inhale 2 puffs into the lungs every 6 (six) hours as needed for wheezing or shortness of breath. 01/11/17   Bjorn Pippin, PA-C  azithromycin (ZITHROMAX Z-PAK) 250 MG tablet 2 tablets  po x 1 day then 1 tablet po q day x 4 days 01/11/17   Bjorn Pippin, PA-C    Family History Family History  Problem Relation Age of Onset  . Heart disease Mother   . Alcohol abuse Mother   . Drug abuse Mother   . Heart disease Father   . Drug abuse Father   . Alcohol abuse Father     Social History Social History   Tobacco Use  . Smoking status: Never Smoker  . Smokeless tobacco: Never Used  Substance Use Topics  . Alcohol use: No  . Drug use: No     Allergies   Patient has no known allergies.   Review of Systems Review of Systems  Constitutional: Positive for  fatigue. Negative for chills and fever.  HENT: Positive for sore throat. Negative for congestion, postnasal drip, rhinorrhea and sinus pain.   Eyes: Negative.   Respiratory: Positive for cough, chest tightness and wheezing.   Cardiovascular: Negative.   Skin: Negative.   Allergic/Immunologic: Positive for environmental allergies.  Neurological: Negative for dizziness.  Hematological: Negative.      Physical Exam Triage Vital Signs ED Triage Vitals  Enc Vitals Group     BP 01/11/17 1212 123/75     Pulse Rate 01/11/17 1212 76     Resp --      Temp 01/11/17 1212 98.2 F (36.8 C)     Temp Source 01/11/17 1212 Oral     SpO2 01/11/17 1212 96 %     Weight --      Height --      Head Circumference --      Peak Flow --      Pain Score 01/11/17 1209 7     Pain Loc --      Pain Edu? --      Excl. in [place]? --    No data found.  Updated Vital Signs BP 123/75 (BP Location: Left Arm)   Pulse 76   Temp 98.2 F (36.8 C) (Oral)   LMP  (LMP Unknown)   SpO2 96%   Visual Acuity Right Eye Distance:   Left Eye Distance:   Bilateral Distance:    Right Eye Near:   Left Eye Near:    Bilateral Near:     Physical Exam  Constitutional: She appears well-developed and well-nourished. No distress.  HENT:  Head: Normocephalic and atraumatic.  Right Ear: External ear normal.  Left Ear: External ear normal.  Mouth/Throat: Oropharynx is clear and moist. No oropharyngeal exudate.  Cardiovascular: Normal rate and regular rhythm.  Pulmonary/Chest: Effort normal.  Few wheeze throughout lower base with fine crackles throughout, no consolidation is noted.   Lymphadenopathy:    She has no cervical adenopathy.  Neurological: She is alert.  Skin: She is not diaphoretic.  Psychiatric: Her behavior is normal.  Nursing note and vitals reviewed.    UC Treatments / Results  Labs (all labs ordered are listed, but only abnormal results are displayed) Labs Reviewed - No data to display  EKG  EKG  Interpretation None       Radiology No results found.  Procedures Procedures (including critical care time)  Medications Ordered in UC Medications - No data to display   Initial Impression / Assessment and Plan / UC Course  I have reviewed the triage vital signs and the nursing notes.  Pertinent labs & imaging results that were available during my care of the patient were reviewed by me and  considered in my medical decision making (see chart for details).     Given duration and exam findings cover the ABX, in conjunction with Albuterol MDI symptomatic care. FU here or with PCP if not improving. No cardiac concerns are noted on exam or history.   Final Clinical Impressions(s) / UC Diagnoses   Final diagnoses:  Acute upper respiratory infection  Cough    ED Discharge Orders        Ordered    albuterol (PROVENTIL HFA;VENTOLIN HFA) 108 (90 Base) MCG/ACT inhaler  Every 6 hours PRN     01/11/17 1224    azithromycin (ZITHROMAX Z-PAK) 250 MG tablet     01/11/17 1224       Controlled Substance Prescriptions [place] Controlled Substance Registry consulted? Not Applicable   Bjorn Pippin, PA-C 01/11/17 1230

## 2017-02-20 ENCOUNTER — Ambulatory Visit: Payer: Medicare HMO | Admitting: Endocrinology

## 2017-02-24 ENCOUNTER — Encounter: Payer: Self-pay | Admitting: Family Medicine

## 2017-02-24 ENCOUNTER — Ambulatory Visit (INDEPENDENT_AMBULATORY_CARE_PROVIDER_SITE_OTHER): Payer: Medicare HMO | Admitting: Family Medicine

## 2017-02-24 VITALS — BP 122/72 | HR 71 | Temp 98.7°F | Resp 16 | Ht 63.0 in | Wt 151.0 lb

## 2017-02-24 DIAGNOSIS — K219 Gastro-esophageal reflux disease without esophagitis: Secondary | ICD-10-CM

## 2017-02-24 DIAGNOSIS — R0989 Other specified symptoms and signs involving the circulatory and respiratory systems: Secondary | ICD-10-CM

## 2017-02-24 DIAGNOSIS — J069 Acute upper respiratory infection, unspecified: Secondary | ICD-10-CM | POA: Diagnosis not present

## 2017-02-24 DIAGNOSIS — J020 Streptococcal pharyngitis: Secondary | ICD-10-CM

## 2017-02-24 DIAGNOSIS — J989 Respiratory disorder, unspecified: Secondary | ICD-10-CM

## 2017-02-24 LAB — POCT RAPID STREP A (OFFICE): Rapid Strep A Screen: POSITIVE — AB

## 2017-02-24 LAB — POCT INFLUENZA A/B
Influenza A, POC: NEGATIVE
Influenza B, POC: NEGATIVE

## 2017-02-24 MED ORDER — AEROCHAMBER PLUS MISC: 1 refills | Status: DC

## 2017-02-24 MED ORDER — PREDNISONE 20 MG PO TABS: 40.0000 mg | ORAL_TABLET | Freq: Every day | ORAL | 0 refills | Status: AC

## 2017-02-24 MED ORDER — BENZONATATE 100 MG PO CAPS: 200.0000 mg | ORAL_CAPSULE | Freq: Two times a day (BID) | ORAL | 0 refills | Status: DC | PRN

## 2017-02-24 MED ORDER — AMOXICILLIN 500 MG PO CAPS: 500.0000 mg | ORAL_CAPSULE | Freq: Two times a day (BID) | ORAL | 0 refills | Status: DC

## 2017-02-24 NOTE — Progress Notes (Signed)
ACUTE VISIT  HPI:  Chief Complaint  Patient presents with  . Cough  . Headache    Started friday  . Sore Throat  . Chest Pain  . Shortness of Breath    [name] is a 72 y.o.female here today complaining of 3 days of respiratory symptoms.  Symptoms are started with sore throat, which is a slightly better now.  HPI  Wheezing and chest tightening for the past 2 days. Chest wall soreness upon coughing and bending forward.  She states that she is thinking GERD is causing some of her symptoms because she also has been "burping" a lot. She is currently on Omeprazole 20 mg twice daily and Pepcid 20 mg daily.  Denies abdominal pain, nausea, vomiting,or changes in bowel habits.   No Hx of recent travel. 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 No known insect bite.  Hx of allergies: yes. She has had episodes of wheezing before, has tried Albuterol inhaler but she is not sure if she is doing the right because she does not have a spacer.  OTC medications for this problem: Throat lozenges and gargles with saline.      Review of Systems  Constitutional: Positive for appetite change and fatigue. Negative for activity change and fever.  HENT: Positive for congestion, ear pain, postnasal drip, rhinorrhea, sinus pressure and sore throat. Negative for mouth sores, trouble swallowing and voice change.   Eyes: Negative for discharge, redness and itching.  Respiratory: Positive for cough, chest tightness, shortness of breath and wheezing.   Cardiovascular: Negative for leg swelling.  Gastrointestinal: Negative for abdominal pain, diarrhea, nausea and vomiting.  Genitourinary: Negative for decreased urine volume and hematuria.  Musculoskeletal: Negative for gait problem and myalgias.  Skin: Negative for rash.  Allergic/Immunologic: Positive for environmental allergies.  Neurological: Positive for headaches (Frontal  pressure, exacerbated by coughing). Negative for syncope, facial asymmetry, weakness and numbness.  Hematological: Negative for adenopathy. Does not bruise/bleed easily.  Psychiatric/Behavioral: Negative for confusion. The patient is nervous/anxious.       Current Outpatient Medications on File Prior to Visit  Medication Sig Dispense Refill  . albuterol (PROVENTIL HFA;VENTOLIN HFA) 108 (90 Base) MCG/ACT inhaler Inhale 2 puffs into the lungs every 6 (six) hours as needed for wheezing or shortness of breath. 1 Inhaler 2  . azithromycin (ZITHROMAX Z-PAK) 250 MG tablet 2 tablets po x 1 day then 1 tablet po q day x 4 days 6 tablet 0  . Calcium Carbonate-Vit D-Min (CALCIUM 1200 PO) Take 1 tablet by mouth daily.    . cetirizine (ZYRTEC) 10 MG tablet Take 10 mg by mouth daily.    . Cholecalciferol (VITAMIN D3) 1000 units CHEW Chew 1 tablet by mouth daily.    . famotidine (PEPCID) 20 MG tablet Take 20 mg by mouth daily. 1 tab twice daily    . FLUoxetine (PROZAC) 20 MG capsule TAKE ONE CAPSULE BY MOUTH ONCE DAILY 90 capsule 1  . fluticasone (FLONASE) 50 MCG/ACT nasal spray Place 2 sprays into both nostrils daily.    Marland Kitchen ipratropium (ATROVENT) 0.03 % nasal spray 2 sprays in each nostril twice a day. 30 mL 3  . levothyroxine (SYNTHROID, LEVOTHROID) 50 MCG tablet TAKE ONE TABLET BY MOUTH ONCE DAILY BEFORE BREAKFAST 90 tablet 2  . omeprazole (PRILOSEC) 20 MG capsule Take 1 capsule (20 mg total) by mouth 2 (two) times daily before a meal. 60 capsule 3  .  pravastatin (PRAVACHOL) 20 MG tablet Take 1 tablet (20 mg total) by mouth daily. 90 tablet 1  . Probiotic Product (PROBIOTIC-10 PO) Take by mouth.     No current facility-administered medications on file prior to visit.      Past Medical History:  Diagnosis Date  . Allergy   . Anxiety   . Dyslipidemia   . Frequent headaches   . GERD (gastroesophageal reflux disease)   . Hyperlipidemia   . IBS (irritable bowel syndrome)   . Pituitary insufficiency  ([place])   . Thyroid disease   . Urine incontinence    No Known Allergies  Social History   Socioeconomic History  . Marital status: Single    Spouse name: None  . Number of children: None  . Years of education: None  . Highest education level: None  Social Needs  . Financial resource strain: None  . Food insecurity - worry: None  . Food insecurity - inability: None  . Transportation needs - medical: None  . Transportation needs - non-medical: None  Occupational History  . None  Tobacco Use  . Smoking status: Never Smoker  . Smokeless tobacco: Never Used  Substance and Sexual Activity  . Alcohol use: No  . Drug use: No  . Sexual activity: No  Other Topics Concern  . None  Social History Narrative  . None    Vitals:   02/24/17 1505  BP: 122/72  Pulse: 71  Resp: 16  Temp: 98.7 F (37.1 C)  SpO2: 96%   Body mass index is 26.75 kg/m.  Physical Exam  Nursing note and vitals reviewed. Constitutional: She is oriented to person, place, and time. She appears well-developed and well-nourished. She does not appear ill. No distress.  HENT:  Head: Normocephalic and atraumatic.  Right Ear: External ear normal.  Left Ear: External ear normal.  Nose: Rhinorrhea present. Right sinus exhibits no maxillary sinus tenderness and no frontal sinus tenderness. Left sinus exhibits no maxillary sinus tenderness and no frontal sinus tenderness.  Mouth/Throat: Uvula is midline and mucous membranes are normal. Posterior oropharyngeal erythema present. No oropharyngeal exudate or posterior oropharyngeal edema.  Excess cerumen, could not see TMs bilaterally.  Eyes: Conjunctivae are normal.  Neck: No muscular tenderness present. No edema and no erythema present.  Cardiovascular: Normal rate and regular rhythm.  No murmur heard. Respiratory: Effort normal. No stridor. No respiratory distress. She has wheezes. She has no rales. She exhibits no tenderness.  Lymphadenopathy:       Head (right  side): No submandibular adenopathy present.       Head (left side): No submandibular adenopathy present.    She has cervical adenopathy.       Right cervical: Posterior cervical adenopathy present.       Left cervical: Posterior cervical adenopathy present.  Neurological: She is alert and oriented to person, place, and time. She has normal strength.  Skin: Skin is warm. No rash noted. No erythema.  Psychiatric: Her mood appears anxious.  Well groomed, good eye contact.    ASSESSMENT AND PLAN:   NancyHerman was seen today for cough, headache, sore throat, chest pain and shortness of breath.  Diagnoses and all orders for this visit:  Reactive airway disease that is not asthma  Here in the office she received DuoNeb neb treatment, wheezing improved greatly, no rales or rhonchi on auscultation. After discussion of side effects of Prednisone, she agrees to take it for 5 days. Albuterol inh 2 puff every 6 hours  for a week then as needed for wheezing or shortness of breath.  I do not think imaging is needed at this time. Instructed about warning signs.  -     Spacer/Aero-Holding Chambers (AEROCHAMBER PLUS) inhaler; Use as instructed to use with inahaler. -     predniSONE (DELTASONE) 20 MG tablet; Take 2 tablets (40 mg total) by mouth daily with breakfast for 5 days.  URI, acute  Rapid flu test neg. Symptomatic treatment with plenty of fluids and OTC Acetaminophen recommended. Explained that cough and congestion might last a few days and even weeks after acute symptoms have resolved.  -     POCT Influenza A/B -     POCT rapid strep A -     benzonatate (TESSALON) 100 MG capsule; Take 2 capsules (200 mg total) by mouth 2 (two) times daily as needed for up to 10 days.  Strep pharyngitis  Rapid strep positive here in the office. Amoxacillin started today, some side effects discussed. Continue symptomatic treatment with throat lozenges and gargles with saline. 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  -     amoxicillin (AMOXIL) 500 MG capsule; Take 1 capsule (500 mg total) by mouth 2 (two) times daily for 10 days.  Gastroesophageal reflux disease, esophagitis presence not specified  Explained that cough can aggravate GERD symptoms. For now no changes in current management. GERD precautions to continue.   -Nancy Herman advised to seek attention immediately if symptoms worsen or to follow if they persist or new concerns arise.       Maximum Nancy G. Martinique, MD  [facility]. [place] office.

## 2017-02-24 NOTE — Patient Instructions (Signed)
A few things to remember from today's visit:   Reactive airway disease that is not asthma - Plan: Spacer/Aero-Holding Chambers (AEROCHAMBER PLUS) inhaler, predniSONE (DELTASONE) 20 MG tablet  URI, acute - Plan: POCT Influenza A/B, POCT rapid strep A, benzonatate (TESSALON) 100 MG capsule  Strep pharyngitis - Plan: amoxicillin (AMOXIL) 500 MG capsule  Albuterol inh 2 puff every 6 hours for a week then as needed for wheezing or shortness of breath.    Symptomatic treatment: Over the counter Acetaminophen 500 mg and/or Ibuprofen (400-600 mg) if there is not contraindications; you can alternate in between both every 4-6 hours. Gargles with saline water and throat lozenges might also help. Cold fluids.    Seek prompt medical evaluation if you are having difficulty breathing, mouth swelling, throat closing up, not able to swallow liquids (drooling), skin rash/bruising, or worsening symptoms.  Please follow up in 2 weeks if not any better.    Please be sure medication list is accurate. If a new problem present, please set up appointment sooner than planned today.

## 2017-03-03 ENCOUNTER — Encounter: Payer: Self-pay | Admitting: Endocrinology

## 2017-03-03 ENCOUNTER — Ambulatory Visit: Payer: Medicare HMO | Admitting: Endocrinology

## 2017-03-03 VITALS — BP 118/64 | HR 75 | Wt 151.0 lb

## 2017-03-03 DIAGNOSIS — D352 Benign neoplasm of pituitary gland: Secondary | ICD-10-CM

## 2017-03-03 LAB — BASIC METABOLIC PANEL
BUN: 15 mg/dL (ref 6–23)
CO2: 30 mEq/L (ref 19–32)
Calcium: 8.9 mg/dL (ref 8.4–10.5)
Chloride: 104 mEq/L (ref 96–112)
Creatinine, Ser: 1.06 mg/dL (ref 0.40–1.20)
GFR: 66 mL/min (ref >60.00)
Glucose, Bld: 112 mg/dL — ABNORMAL HIGH (ref 70–99)
Potassium: 3.2 mEq/L — ABNORMAL LOW (ref 3.5–5.1)
Sodium: 143 mEq/L (ref 135–145)

## 2017-03-03 LAB — T4, FREE: Free T4: 1 ng/dL (ref 0.60–1.60)

## 2017-03-03 LAB — TSH: TSH: 1.5 u[IU]/mL (ref 0.35–4.50)

## 2017-03-03 NOTE — Patient Instructions (Signed)
Thyroid blood tests are requested for you today.  We'll let you know about the results.  Please come back for a follow-up appointment in 6 months.   

## 2017-03-03 NOTE — Progress Notes (Signed)
Subjective:    Patient ID: Nancy Herman, female    DOB: Sep 29, 1945, 72 y.o.   MRN: [id number]  HPI Pt returns for f/u of pituitary adenoma (was 1.8 mm diameter; dx'ed and resected in 2017 (in [place]); she now takes only synthroid;  f/u MRI in late 2017 showed no residual tumor).  pt states she feels well in general.  She had these pit function results in 2017: FSH/LH: normal Prol: normal ACTH: stim test normal GH: IGF-1 was normal VP: USG=1.010, VP was undetectable; 24-HR urine was 2300 ml TSH: euthyroid on synthroid.   She has intermitt mild headache.  She has nocturia 1-2 times per year.   Past Medical History:  Diagnosis Date  . Allergy   . Anxiety   . Dyslipidemia   . Frequent headaches   . GERD (gastroesophageal reflux disease)   . Hyperlipidemia   . IBS (irritable bowel syndrome)   . Pituitary insufficiency ([place])   . Thyroid disease   . Urine incontinence     Past Surgical History:  Procedure Laterality Date  . ABDOMINAL HYSTERECTOMY  1990  . BRAIN SURGERY    . BREAST EXCISIONAL BIOPSY Right unsure  . BREAST EXCISIONAL BIOPSY Left unsure  . OVARIAN CYST SURGERY  1994  . REPAIR RECTOCELE  2016   and prolapsed uterus.  . TONSILLECTOMY  1973    Social History   Socioeconomic History  . Marital status: Single    Spouse name: Not on file  . Number of children: Not on file  . Years of education: Not on file  . Highest education level: Not on file  Social Needs  . Financial resource strain: Not on file  . Food insecurity - worry: Not on file  . Food insecurity - inability: Not on file  . Transportation needs - medical: Not on file  . Transportation needs - non-medical: Not on file  Occupational History  . Not on file  Tobacco Use  . Smoking status: Never Smoker  . Smokeless tobacco: Never Used  Substance and Sexual Activity  . Alcohol use: No  . Drug use: No  . Sexual activity: No  Other Topics Concern  . Not on file  Social History Narrative  .  Not on file    Current Outpatient Medications on File Prior to Visit  Medication Sig Dispense Refill  . albuterol (PROVENTIL HFA;VENTOLIN HFA) 108 (90 Base) MCG/ACT inhaler Inhale 2 puffs into the lungs every 6 (six) hours as needed for wheezing or shortness of breath. 1 Inhaler 2  . Calcium Carbonate-Vit D-Min (CALCIUM 1200 PO) Take 1 tablet by mouth daily.    . cetirizine (ZYRTEC) 10 MG tablet Take 10 mg by mouth daily.    . Cholecalciferol (VITAMIN D3) 1000 units CHEW Chew 1 tablet by mouth daily.    . famotidine (PEPCID) 20 MG tablet Take 20 mg by mouth daily. 1 tab twice daily    . FLUoxetine (PROZAC) 20 MG capsule TAKE ONE CAPSULE BY MOUTH ONCE DAILY 90 capsule 1  . fluticasone (FLONASE) 50 MCG/ACT nasal spray Place 2 sprays into both nostrils daily.    Marland Kitchen ipratropium (ATROVENT) 0.03 % nasal spray 2 sprays in each nostril twice a day. 30 mL 3  . levothyroxine (SYNTHROID, LEVOTHROID) 50 MCG tablet TAKE ONE TABLET BY MOUTH ONCE DAILY BEFORE BREAKFAST 90 tablet 2  . omeprazole (PRILOSEC) 20 MG capsule Take 1 capsule (20 mg total) by mouth 2 (two) times daily before a meal. 60 capsule 3  .  pravastatin (PRAVACHOL) 20 MG tablet Take 1 tablet (20 mg total) by mouth daily. 90 tablet 1  . Probiotic Product (PROBIOTIC-10 PO) Take by mouth.    . Spacer/Aero-Holding Chambers (AEROCHAMBER PLUS) inhaler Use as instructed to use with inahaler. 1 each 1   No current facility-administered medications on file prior to visit.     No Known Allergies  Family History  Problem Relation Age of Onset  . Heart disease Mother   . Alcohol abuse Mother   . Drug abuse Mother   . Heart disease Father   . Drug abuse Father   . Alcohol abuse Father     BP 118/64 (BP Location: Left Arm, Patient Position: Sitting, Cuff Size: Normal)   Pulse 75   Wt 151 lb (68.5 kg)   LMP  (LMP Unknown)   SpO2 96%   BMI 26.75 kg/m   Review of Systems Denies visual loss    Objective:   Physical Exam VITAL SIGNS:  See  vs page GENERAL: no distress.  Ext: trace bilat leg edema.      Assessment & Plan:  Pituitary insuff: due for recheck  Patient Instructions  Thyroid blood tests are requested for you today.  We'll let you know about the results.   Please come back for a follow-up appointment in 6 months.

## 2017-03-25 ENCOUNTER — Telehealth: Payer: Self-pay | Admitting: Family Medicine

## 2017-03-25 NOTE — Telephone Encounter (Signed)
We do not have samples but we do have coupons. If this tingling is new and persistent please advise pt to arrange appt.  Thanks, BJ

## 2017-03-25 NOTE — Telephone Encounter (Signed)
Copied from [place] #[id number]. Topic: Quick Communication - See Telephone Encounter >> Mar 25, 2017  3:42 PM Robina Ade, Helene Kelp D wrote: CRM for notification. See Telephone encounter for: 03/25/17. Patient called and would like to talk to Dr. Martinique CMA about if the office has sample for her medication Linzess due to it is too high for her to get. Please call patient back, thanks.

## 2017-03-25 NOTE — Telephone Encounter (Signed)
Spoke with patient about her concern with medication price. Patient also wanted to let Dr. Martinique know that she has been having some tingling down her right arm off and on.

## 2017-03-25 NOTE — Telephone Encounter (Signed)
Informed patient of coupon, but patient is unable to use it due to insurance. Patient has appointment 03/26/17 to discuss tingling in arm.

## 2017-03-26 ENCOUNTER — Encounter: Payer: Self-pay | Admitting: Family Medicine

## 2017-03-26 ENCOUNTER — Ambulatory Visit (INDEPENDENT_AMBULATORY_CARE_PROVIDER_SITE_OTHER): Payer: Medicare HMO | Admitting: Family Medicine

## 2017-03-26 VITALS — BP 140/90 | HR 72 | Temp 98.2°F | Resp 12 | Ht 63.0 in | Wt 155.2 lb

## 2017-03-26 DIAGNOSIS — M7541 Impingement syndrome of right shoulder: Secondary | ICD-10-CM

## 2017-03-26 DIAGNOSIS — M79601 Pain in right arm: Secondary | ICD-10-CM

## 2017-03-26 DIAGNOSIS — M542 Cervicalgia: Secondary | ICD-10-CM | POA: Diagnosis not present

## 2017-03-26 MED ORDER — METHYLPREDNISOLONE ACETATE 40 MG/ML IJ SUSP
40.0000 mg | Freq: Once | INTRAMUSCULAR | Status: AC
  Administered 2017-03-26: 40 mg via INTRAMUSCULAR

## 2017-03-26 MED ORDER — TIZANIDINE HCL 2 MG PO CAPS: 2.0000 mg | ORAL_CAPSULE | Freq: Three times a day (TID) | ORAL | 1 refills | Status: DC | PRN

## 2017-03-26 NOTE — Patient Instructions (Signed)
A few things to remember from today's visit:   Right arm pain  Cervicalgia - Plan: tizanidine (ZANAFLEX) 2 MG capsule, Ambulatory referral to Physical Therapy  Impingement syndrome of right shoulder - Plan: Ambulatory referral to Physical Therapy  Zanaflex can cause drowsiness.  Local ice and massage.     Please be sure medication list is accurate. If a new problem present, please set up appointment sooner than planned today.

## 2017-03-26 NOTE — Progress Notes (Signed)
ACUTE VISIT   HPI:  Chief Complaint  Patient presents with  . Arm Pain    tingling/pain in right, has been happening all the time for a week    [name] is a 72 y.o. female, who is here today complaining of a week of constant cervical pain. She has had cervical pain intermittently for a while. 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  She has had right molar pain, she wonders if this is causing pain.She is having tooth pull in a couple days.  Right upper back and shoulder pain radiated to "the whole arm." "Nagging" pain, 5/10, she has not identified exacerbating or alleviating factors.  2-3 weeks of right shoulder pain 8/10,exacerbated by lying on right side.  She has not noted numbness. + Tingling sensation on hands and forearms upon clamping.  She has taken Tylenol.  No associated rash or limitation of ROM. No fever or chills.  Problem has been stable.   Review of Systems  Constitutional: Positive for fatigue. Negative for appetite change and fever.  HENT: Negative for mouth sores, sore throat, trouble swallowing and voice change.   Respiratory: Negative for cough, shortness of breath and wheezing.   Cardiovascular: Negative for chest pain, palpitations and leg swelling.  Gastrointestinal: Negative for abdominal pain, nausea and vomiting.       No changes in bowel habits.  Endocrine: Negative for cold intolerance and heat intolerance.  Genitourinary: Negative for decreased urine volume and hematuria.  Musculoskeletal: Positive for arthralgias, back pain and neck pain. Negative for joint swelling.  Skin: Negative for rash.  Neurological: Negative for syncope, weakness and headaches.  Hematological: Negative for adenopathy.  Psychiatric/Behavioral: Negative for confusion. The patient is nervous/anxious.       Current Outpatient Medications on File Prior to Visit  Medication Sig Dispense  Refill  . albuterol (PROVENTIL HFA;VENTOLIN HFA) 108 (90 Base) MCG/ACT inhaler Inhale 2 puffs into the lungs every 6 (six) hours as needed for wheezing or shortness of breath. 1 Inhaler 2  . Calcium Carbonate-Vit D-Min (CALCIUM 1200 PO) Take 1 tablet by mouth daily.    . cetirizine (ZYRTEC) 10 MG tablet Take 10 mg by mouth daily.    . Cholecalciferol (VITAMIN D3) 1000 units CHEW Chew 1 tablet by mouth daily.    . famotidine (PEPCID) 20 MG tablet Take 20 mg by mouth daily. 1 tab twice daily    . FLUoxetine (PROZAC) 20 MG capsule TAKE ONE CAPSULE BY MOUTH ONCE DAILY 90 capsule 1  . fluticasone (FLONASE) 50 MCG/ACT nasal spray Place 2 sprays into both nostrils daily.    Marland Kitchen ipratropium (ATROVENT) 0.03 % nasal spray 2 sprays in each nostril twice a day. 30 mL 3  . levothyroxine (SYNTHROID, LEVOTHROID) 50 MCG tablet TAKE ONE TABLET BY MOUTH ONCE DAILY BEFORE BREAKFAST 90 tablet 2  . LINZESS 145 MCG CAPS capsule     . omeprazole (PRILOSEC) 20 MG capsule Take 1 capsule (20 mg total) by mouth 2 (two) times daily before a meal. 60 capsule 3  . pravastatin (PRAVACHOL) 20 MG tablet Take 1 tablet (20 mg total) by mouth daily. 90 tablet 1  . Probiotic Product (PROBIOTIC-10 PO) Take by mouth.    . Spacer/Aero-Holding Chambers (AEROCHAMBER PLUS) inhaler Use as instructed to use with inahaler. 1 each 1   No current facility-administered medications on file prior to visit.      Past Medical History:  Diagnosis Date  . Allergy   . Anxiety   . Dyslipidemia   . Frequent headaches   . GERD (gastroesophageal reflux disease)   . Hyperlipidemia   . IBS (irritable bowel syndrome)   . Pituitary insufficiency ([place])   . Thyroid disease   . Urine incontinence    No Known Allergies  Social History   Socioeconomic History  . Marital status: Single    Spouse name: None  . Number of children: None  . Years of education: None  . Highest education level: None  Social Needs  . Financial resource strain: None    . Food insecurity - worry: None  . Food insecurity - inability: None  . Transportation needs - medical: None  . Transportation needs - non-medical: None  Occupational History  . None  Tobacco Use  . Smoking status: Never Smoker  . Smokeless tobacco: Never Used  Substance and Sexual Activity  . Alcohol use: No  . Drug use: No  . Sexual activity: No  Other Topics Concern  . None  Social History Narrative  . None    Vitals:   03/26/17 1504  BP: 140/90  Pulse: 72  Resp: 12  Temp: 98.2 F (36.8 C)  SpO2: 97%   Body mass index is 27.5 kg/m.   Physical Exam  Nursing note and vitals reviewed. Constitutional: She is oriented to person, place, and time. She appears well-developed. She does not appear ill. No distress.  HENT:  Head: Normocephalic and atraumatic.  Eyes: Conjunctivae are normal. Pupils are equal, round, and reactive to light.  Respiratory: Effort normal and breath sounds normal. No respiratory distress.  GI: Soft. She exhibits no mass. There is no hepatomegaly. There is no tenderness.  Musculoskeletal: She exhibits no edema.       Right shoulder: She exhibits tenderness. She exhibits normal range of motion, no bony tenderness and no deformity.       Cervical back: She exhibits decreased range of motion and tenderness. She exhibits no bony tenderness.       Thoracic back: She exhibits tenderness.       Back:  Right shoulder: No deformity, edema, or erythema appreciated.No muscle atrophy. Luan Pulling' test pos, drop arm rotator cuff test neg, empty can supraspinatus test pos, cross body adduction test neg, lift-Off Subscapularis test pos. ROM with no significant limitation, it elicits pain.  Pain upon palpation of right paraspinal cervical muscles,trapezium, and right thoracic paraspinal muscles.   Lymphadenopathy:    She has no cervical adenopathy.  Neurological: She is alert and oriented to person, place, and time. She has normal strength. Gait normal.  Skin:  Skin is warm. No rash noted. No erythema.  Psychiatric: Her mood appears anxious.  Well groomed, good eye contact.      ASSESSMENT AND PLAN:   Nancy Herman was seen today for arm pain.  Diagnoses and all orders for this visit:  Right arm pain  ? Radicular pain. She does not want to take Prednisone, afraid of GI side effects. She agrees with DepoMedrol 40 mg IM here in the office. If not resolved in 4-6 weeks or worsening cervical MRI to be considered.  Cervicalgia  Chronic. Local ice and massage may help. Side effects of Zanaflex discussed. PT evaluation will be arranged.  -     tizanidine (ZANAFLEX) 2 MG capsule; Take 1 capsule (2 mg total) by mouth 3 (three) times daily as needed for muscle spasms. -     Ambulatory referral to Physical Therapy  Impingement syndrome of right shoulder  Treatment options discussed. PT will be arrange. I do not think imaging is necessary today since there is no Hx of recent trauma.  -     Ambulatory referral to Physical Therapy     Return in about 6 weeks (around 05/07/2017) for right arm pain.     -Nancy Herman was advised to seek immediate medical attention if sudden worsening symptoms.     Al Gagen G. Martinique, MD  [facility]. [place] office.

## 2017-03-31 ENCOUNTER — Encounter: Payer: Self-pay | Admitting: Physical Therapy

## 2017-03-31 ENCOUNTER — Ambulatory Visit: Payer: Medicare HMO | Attending: Family Medicine | Admitting: Physical Therapy

## 2017-03-31 ENCOUNTER — Other Ambulatory Visit: Payer: Self-pay | Admitting: Family Medicine

## 2017-03-31 ENCOUNTER — Other Ambulatory Visit: Payer: Self-pay

## 2017-03-31 DIAGNOSIS — M25511 Pain in right shoulder: Secondary | ICD-10-CM | POA: Diagnosis present

## 2017-03-31 DIAGNOSIS — M62830 Muscle spasm of back: Secondary | ICD-10-CM | POA: Diagnosis present

## 2017-03-31 DIAGNOSIS — M6281 Muscle weakness (generalized): Secondary | ICD-10-CM | POA: Diagnosis present

## 2017-03-31 DIAGNOSIS — M542 Cervicalgia: Secondary | ICD-10-CM | POA: Insufficient documentation

## 2017-04-01 NOTE — Therapy (Addendum)
[facility] [address], [place], [place], [address] Phone: [phone]   Fax:  [phone]  Physical Therapy Evaluation  Patient Details  Name: Nancy Herman MRN: [id number] Date of Birth: 18-Jan-1946 Referring Provider: Betty Martinique, MD    Encounter Date: 03/31/2017  PT End of Session - 03/31/17 1531    Visit Number  1    Date for PT Re-Evaluation  05/29/17    Authorization Type  [employer] Medicare    Authorization Time Period  03/31/17 to 05/29/17    PT Start Time  1446    PT Stop Time  1529    PT Time Calculation (min)  43 min    Activity Tolerance  Patient tolerated treatment well;No increased pain    Behavior During Therapy  WFL for tasks assessed/performed       Past Medical History:  Diagnosis Date  . Allergy   . Anxiety   . Dyslipidemia   . Frequent headaches   . GERD (gastroesophageal reflux disease)   . Hyperlipidemia   . IBS (irritable bowel syndrome)   . Pituitary insufficiency ([place])   . Thyroid disease   . Urine incontinence     Past Surgical History:  Procedure Laterality Date  . ABDOMINAL HYSTERECTOMY  1990  . BRAIN SURGERY    . BREAST EXCISIONAL BIOPSY Right unsure  . BREAST EXCISIONAL BIOPSY Left unsure  . OVARIAN CYST SURGERY  1994  . REPAIR RECTOCELE  2016   and prolapsed uterus.  . TONSILLECTOMY  1973    There were no vitals filed for this visit.   Subjective Assessment - 03/31/17 1452    Subjective  Pt reports that she started having aching pain down the arm in the past 2 weeks or so, which came out of nowhere. She does have neck pain and a wisdom tooth that has been bothering her as well. She says the shoulder is also tight. She is not sure of an exact cause, but does feel like it might be about the same since it started.     Pertinent History  headaches     Limitations  Lifting;House hold activities    Currently in Pain?  Other (Comment) no neck pain currently    Pain  Score  4     Pain Location  Shoulder    Pain Orientation  Right;Upper;Posterior    Pain Descriptors / Indicators  Aching;Dull;Throbbing    Pain Type  Acute pain    Pain Radiating Towards  down the arm to the     Pain Onset  1 to 4 weeks ago    Pain Frequency  Intermittent    Aggravating Factors   reaching overhead, lifting heavy objects, sleeping on the Rt side    Pain Relieving Factors  not using the arm     Effect of Pain on Daily Activities  difficulty sleeping on the Rt side and lifting objects         [facility] PT Assessment - 04/01/17 0001      Assessment   Medical Diagnosis  cervicalgia and Rt shoulder impingement     Referring Provider  Betty Martinique, MD     Onset Date/Surgical Date  -- 2 weeks ago    Hand Dominance  Right    Prior Therapy  none       Precautions   Precautions  None      Balance Screen   Has the patient fallen in the past 6 months  No    Has the patient had a decrease in activity level because of a fear of falling?   No    Is the patient reluctant to leave their home because of a fear of falling?   No      Prior Function   Level of Independence  Independent      Observation/Other Assessments   Focus on Therapeutic Outcomes (FOTO)   38% limited      Posture/Postural Control   Posture Comments  forward head, rounded shoulders       AROM   Cervical Extension  30 deg pain Rt side of neck    Cervical - Right Side Bend  35 deg pain Rt side of neck (less than to the Lt)    Cervical - Left Side Bend  45 deg pain Rt side of neck    Cervical - Right Rotation  45 deg pain Rt side of neck    Cervical - Left Rotation  45 deg  tightness Rt side of neck      Strength   Right Shoulder Flexion  3/5 pain    Right Shoulder ABduction  4/5 pain    Right Shoulder Internal Rotation  4/5    Right Shoulder External Rotation  4/5    Left Shoulder Flexion  5/5    Left Shoulder ABduction  5/5    Left Shoulder Internal Rotation  4/5    Left Shoulder External Rotation   4/5    Right/Left Elbow  -- BUE 5/5       Palpation   Palpation comment  tenderness along Rt cervical paraspinals, Rt upper trap, Rt levator scap       Special Tests   Other special tests  (+) hawkin's kennedy, (+) Neer on the Rt              Objective measurements completed on examination: See above findings.              PT Education - 03/31/17 1529    Education provided  Yes    Education Details  eval findings/POC; importance of addressing limitations in ROM/strength to allow for better function and use of the UE; benefits of PT    Person(s) Educated  Patient    Methods  Explanation    Comprehension  Verbalized understanding       PT Short Term Goals - 04/01/17 0831      PT SHORT TERM GOAL #1   Title  Pt will demo consistency and independence with her HEP to improve ROM and decrease pain.     Time  4    Period  Weeks    Status  New    Target Date  04/28/17      PT SHORT TERM GOAL #2   Title  Pt will demo proper use and set up of towel roll to increase her posture awareness throughout the day at work.     Time  4    Period  Weeks    Status  New      PT SHORT TERM GOAL #3   Title  Pt will report atleast 30% improvement in her symptoms from the start of PT, to allow her to get back into her regular activity without as much difficulty.     Period  Weeks    Status  New        PT Long Term Goals - 04/01/17 0834      PT LONG TERM GOAL #  1   Title  Pt will demo improved UE strength to atleast 4/5 MMT which will allow her to lift objects over head without as much difficulty.     Time  8    Period  Weeks    Status  New      PT LONG TERM GOAL #2   Title  Pt will report being able to reach overhead to grab a cup from the shelf with 60% improvement from the start of PT.     Time  8    Period  Weeks    Status  New      PT LONG TERM GOAL #3   Title  Pt will demo improved active cervical rotation ROM to atleast 60 deg Lt and Rt to assist with turning her  head while driving.     Time  8    Period  Weeks    Status  New      PT LONG TERM GOAL #4   Title  Pt will demo improved cervical extension ROM to atleast 50 deg to allow her to look overhead during activity.      Time  8    Period  Weeks    Status  New      PT LONG TERM GOAL #5   Title  Pt will report atleast 60% improvement in her RUE symptoms from the start of therapy, to allow her to complete daily houshold tasks such as mopping without difficulty.     Time  8    Period  Weeks    Status  New             Plan - 04/01/17 0819    Clinical Impression Statement  Pt is a 72 y.o F referred to OPPT with complaints of acute cervical and RUE pain. Pain was onset insidiously and has remained the same. She demonstrates limitations in cervical ROM, in addition to palpable tenderness of the cervical paraspinals on the Rt greater than the Lt. She also has positive Neer and Ralene Muskrat testing along with Rt shoulder weakness and discomfort with overhead activity that is limiting her ability to fix her hair and reach into cabinets overhead. Pt has a difficult time describing the nature of her symptoms, making it difficult to rule out cervical radiculopathy during today's evaluation. She would benefit from skilled PT to address her limitations in cervical ROM, improve UE strength, decrease pain and spasm to allow her to complete daily activity without the need for assistance.      Clinical Presentation  Stable    Clinical Presentation due to:  unchanged since onset     Clinical Decision Making  Low    Rehab Potential  Good    Clinical Impairments Affecting Rehab Potential  limited ability to come 2x/week due to high co-pay    PT Frequency  2x / week    PT Duration  8 weeks    PT Treatment/Interventions  ADLs/Self Care Home Management;Cryotherapy;Electrical Stimulation;Traction;Moist Heat;Therapeutic exercise;Therapeutic activities;Neuromuscular re-education;Patient/family education;Manual  techniques;Passive range of motion;Dry needling;Taping    PT Next Visit Plan  provide HEP; ULTT; cervical mobilizations/ROM; scapular strengthening and stability work     PT Home Exercise Plan  next session    Consulted and Agree with Plan of Care  Patient       Patient will benefit from skilled therapeutic intervention in order to improve the following deficits and impairments:  Decreased activity tolerance, Impaired flexibility, Impaired UE functional use, Hypomobility, Decreased  strength, Decreased range of motion, Increased muscle spasms, Postural dysfunction, Pain, Improper body mechanics  Visit Diagnosis: Cervicalgia  Acute pain of right shoulder  Muscle weakness (generalized)  Muscle spasm of back     Problem List Patient Active Problem List   Diagnosis Date Noted  . Cervicalgia 03/26/2017  . Dysphagia 12/04/2016  . Primary osteoarthritis of both knees 06/03/2016  . Depressive disorder 05/21/2016  . Cough 03/28/2016  . Pituitary insufficiency ([place]) 12/26/2015  . GERD (gastroesophageal reflux disease) 11/06/2015  . IBS (irritable bowel syndrome) 10/16/2015  . Hyperlipidemia 10/16/2015  . Insomnia disorder 10/16/2015  . Anxiety disorder 10/16/2015  . Allergic rhinitis 10/16/2015  . Pituitary adenoma ([place]) 09/26/2015    8:59 AM,04/01/17 Sherol Dade PT, DPT [place] [facility] [address] [place], [place], [address] Phone: [phone]   Fax:  [phone]  Name: TAMARRA GEISELMAN MRN: [id number] Date of Birth: 16-Oct-1945   *addendum to send PT certification to referring physician  8:27 AM,04/04/17 Sherol Dade PT, [place] [facility] [place]

## 2017-04-04 NOTE — Addendum Note (Signed)
Addended bySherol Dade on: 04/04/2017 08:28 AM   Modules accepted: Orders

## 2017-04-08 ENCOUNTER — Encounter: Payer: Medicare HMO | Admitting: Physical Therapy

## 2017-04-16 ENCOUNTER — Ambulatory Visit: Payer: Medicare HMO | Attending: Family Medicine | Admitting: Physical Therapy

## 2017-04-16 ENCOUNTER — Encounter: Payer: Self-pay | Admitting: Physical Therapy

## 2017-04-16 DIAGNOSIS — M25511 Pain in right shoulder: Secondary | ICD-10-CM

## 2017-04-16 DIAGNOSIS — M62830 Muscle spasm of back: Secondary | ICD-10-CM | POA: Diagnosis present

## 2017-04-16 DIAGNOSIS — M6281 Muscle weakness (generalized): Secondary | ICD-10-CM | POA: Diagnosis present

## 2017-04-16 DIAGNOSIS — M542 Cervicalgia: Secondary | ICD-10-CM | POA: Diagnosis present

## 2017-04-16 NOTE — Therapy (Signed)
[facility] [address] [place], [place], [address] Phone: [phone]   Fax:  [phone]  Physical Therapy Treatment  Patient Details  Name: Nancy Herman MRN: [id number] Date of Birth: 1945-10-17 Referring Provider: Betty Martinique, MD    Encounter Date: 04/16/2017  PT End of Session - 04/16/17 1537    Visit Number  2    Date for PT Re-Evaluation  05/29/17    Authorization Type  [employer]ET[employer] Medicare    Authorization Time Period  03/31/17 to 05/29/17    PT Start Time  1532    PT Stop Time  1612    PT Time Calculation (min)  40 min    Activity Tolerance  Patient tolerated treatment well;No increased pain    Behavior During Therapy  WFL for tasks assessed/performed       Past Medical History:  Diagnosis Date  . Allergy   . Anxiety   . Dyslipidemia   . Frequent headaches   . GERD (gastroesophageal reflux disease)   . Hyperlipidemia   . IBS (irritable bowel syndrome)   . Pituitary insufficiency ([place])   . Thyroid disease   . Urine incontinence     Past Surgical History:  Procedure Laterality Date  . ABDOMINAL HYSTERECTOMY  1990  . BRAIN SURGERY    . BREAST EXCISIONAL BIOPSY Right unsure  . BREAST EXCISIONAL BIOPSY Left unsure  . OVARIAN CYST SURGERY  1994  . REPAIR RECTOCELE  2016   and prolapsed uterus.  . TONSILLECTOMY  1973    There were no vitals filed for this visit.  Subjective Assessment - 04/16/17 1535    Subjective  Pt reports that things are going better. She does not have any of her upper neck pain since having her teeth fixed. She will still have shoulder pain with lifting, etc. No pain at this time.     Pertinent History  headaches     Limitations  Lifting;House hold activities    Currently in Pain?  Yes    Pain Onset  1 to 4 weeks ago         [facility] PT Assessment - 04/16/17 0001      Strength   Strength Assessment Site  Other (comment) grip strength (lb) Rt: 42.6, Lt: 30              [facility] Adult PT Treatment/Exercise - 04/16/17 0001      Exercises   Exercises  Shoulder      Shoulder Exercises: Supine   External Rotation  Both;15 reps;Theraband    Theraband Level (Shoulder External Rotation)  Level 1 (Yellow)    External Rotation Weight (lbs)  cues to keep elbows by side     Other Supine Exercises  Lt and Rt shoulder D2 flexion with yellow TB x15 reps each       Shoulder Exercises: Seated   Retraction  Both;15 reps    Retraction Limitations  cues to decrease neck extension compensation      Shoulder Exercises: Stretch   Other Shoulder Stretches  Lt and Rt pec stretch in doorway 2x30 sec each       Manual Therapy   Manual Therapy  Myofascial release    Myofascial Release  TPR Rt upper trap, Rt levator scap              PT Education - 04/16/17 1614    Education provided  Yes    Education Details  technique with therex; HEP initiated  and reviewed    Person(s) Educated  Patient    Methods  Explanation;Verbal cues;Handout;Demonstration    Comprehension  Verbalized understanding;Returned demonstration       PT Short Term Goals - 04/01/17 0831      PT SHORT TERM GOAL #1   Title  Pt will demo consistency and independence with her HEP to improve ROM and decrease pain.     Time  4    Period  Weeks    Status  New    Target Date  04/28/17      PT SHORT TERM GOAL #2   Title  Pt will demo proper use and set up of towel roll to increase her posture awareness throughout the day at work.     Time  4    Period  Weeks    Status  New      PT SHORT TERM GOAL #3   Title  Pt will report atleast 30% improvement in her symptoms from the start of PT, to allow her to get back into her regular activity without as much difficulty.     Period  Weeks    Status  New        PT Long Term Goals - 04/01/17 0539      PT LONG TERM GOAL #1   Title  Pt will demo improved UE strength to atleast 4/5 MMT which will allow her to lift objects over head without  as much difficulty.     Time  8    Period  Weeks    Status  New      PT LONG TERM GOAL #2   Title  Pt will report being able to reach overhead to grab a cup from the shelf with 60% improvement from the start of PT.     Time  8    Period  Weeks    Status  New      PT LONG TERM GOAL #3   Title  Pt will demo improved active cervical rotation ROM to atleast 60 deg Lt and Rt to assist with turning her head while driving.     Time  8    Period  Weeks    Status  New      PT LONG TERM GOAL #4   Title  Pt will demo improved cervical extension ROM to atleast 50 deg to allow her to look overhead during activity.      Time  8    Period  Weeks    Status  New      PT LONG TERM GOAL #5   Title  Pt will report atleast 60% improvement in her RUE symptoms from the start of therapy, to allow her to complete daily houshold tasks such as mopping without difficulty.     Time  8    Period  Weeks    Status  New            Plan - 04/16/17 1615    Clinical Impression Statement  Pt arrived with noted improvements in Rt cervical pain and with sleeping since having her tooth surgery. She does continue to have soreness in the Rt arm and along the upper trap. Session focused on initiating her HEP and therapist provided intermittent feedback to ensure proper technique was used. Pt's grip strength was measured, noting up to 42lb of force on the Rt and 30 lb of force on the Lt with noted fatigue after each test. Ended session without  reports of pain and pt demonstrated good understanding of HEP additions.     Rehab Potential  Good    Clinical Impairments Affecting Rehab Potential  limited ability to come 2x/week due to high co-pay    PT Frequency  2x / week    PT Duration  8 weeks    PT Treatment/Interventions  ADLs/Self Care Home Management;Cryotherapy;Electrical Stimulation;Traction;Moist Heat;Therapeutic exercise;Therapeutic activities;Neuromuscular re-education;Patient/family education;Manual  techniques;Passive range of motion;Dry needling;Taping    PT Next Visit Plan  cervical mobilizations/ROM as needed; thoracic mobility; scapular strengthening and stability work     PT Home Exercise Plan  scap squeezes, shoulder ER with yellow band, pec stretch in doorway, chin tucks supine     Consulted and Agree with Plan of Care  Patient       Patient will benefit from skilled therapeutic intervention in order to improve the following deficits and impairments:  Decreased activity tolerance, Impaired flexibility, Impaired UE functional use, Hypomobility, Decreased strength, Decreased range of motion, Increased muscle spasms, Postural dysfunction, Pain, Improper body mechanics  Visit Diagnosis: Cervicalgia  Acute pain of right shoulder  Muscle weakness (generalized)  Muscle spasm of back     Problem List Patient Active Problem List   Diagnosis Date Noted  . Cervicalgia 03/26/2017  . Dysphagia 12/04/2016  . Primary osteoarthritis of both knees 06/03/2016  . Depressive disorder 05/21/2016  . Cough 03/28/2016  . Pituitary insufficiency ([place]) 12/26/2015  . GERD (gastroesophageal reflux disease) 11/06/2015  . IBS (irritable bowel syndrome) 10/16/2015  . Hyperlipidemia 10/16/2015  . Insomnia disorder 10/16/2015  . Anxiety disorder 10/16/2015  . Allergic rhinitis 10/16/2015  . Pituitary adenoma ([place]) 09/26/2015   5:04 PM,04/16/17 Sherol Dade PT, DPT [facility] [address], [place], [place], [address] Phone: [phone]   Fax:  [phone]  Name: Nancy Herman MRN: [id number] Date of Birth: 11-30-1945

## 2017-04-16 NOTE — Patient Instructions (Addendum)
   SCAPULAR RETRACTIONS  Draw your shoulder blades back and down.  Hold 3 sec, repeat 15 times.     CHIN TUCK - SUPINE  While lying on your back, tuck your chin towards your chest and press the back of your head into the table.  Maintain contact of head with the surface you are lying on the entire time.   Hold 3 sec, repeat 15 times     Bilateral Shoulder External Rotation  Standing with an upright posture, hold the middle of a piece of theraband with both hands (thumbs outwards). With your elbows at your side and arms bent at 90 degree angle, pull your hands outward, and squeeze your shoulder blades down and together.  x15 reps, with yellow band       Doorway Pec Stretch:  Start by having the affected arm flush against the side of the wall at ~90 degrees between your arm and torso and also the arm and elbow.  Have the opposite leg forward and the other back in a modified lunging stance.  Slowly begin to lean forward and there will be a stretching sensation through the pectoral muscle group of the arm on the wall. Hold 30 sec, repeat 3 times on each side.    [facility] [address], [place], [place] [address] Phone # [phone] Fax [phone]

## 2017-04-23 ENCOUNTER — Ambulatory Visit: Payer: Medicare HMO | Admitting: Physical Therapy

## 2017-04-28 ENCOUNTER — Other Ambulatory Visit: Payer: Self-pay | Admitting: Family Medicine

## 2017-04-30 ENCOUNTER — Ambulatory Visit: Payer: Medicare HMO | Admitting: Physical Therapy

## 2017-04-30 ENCOUNTER — Encounter: Payer: Self-pay | Admitting: Physical Therapy

## 2017-04-30 DIAGNOSIS — M542 Cervicalgia: Secondary | ICD-10-CM | POA: Diagnosis not present

## 2017-04-30 DIAGNOSIS — M6281 Muscle weakness (generalized): Secondary | ICD-10-CM

## 2017-04-30 DIAGNOSIS — M25511 Pain in right shoulder: Secondary | ICD-10-CM

## 2017-04-30 DIAGNOSIS — M62830 Muscle spasm of back: Secondary | ICD-10-CM

## 2017-04-30 NOTE — Therapy (Addendum)
[facility] [address], [place], [place], [address] Phone: [phone]   Fax:  [phone]  Physical Therapy Treatment  Patient Details  Name: Nancy Herman MRN: [id number] Date of Birth: [birth year] Referring Provider: Betty Martinique, MD    Encounter Date: 04/30/2017  PT End of Session - 04/30/17 1523    Visit Number  3    Date for PT Re-Evaluation  05/29/17    Authorization Type  [employer] Medicare    Authorization Time Period  03/31/17 to 05/29/17    PT Start Time  1441    PT Stop Time  1523    PT Time Calculation (min)  42 min    Activity Tolerance  Patient tolerated treatment well;No increased pain    Behavior During Therapy  WFL for tasks assessed/performed       Past Medical History:  Diagnosis Date  . Allergy   . Anxiety   . Dyslipidemia   . Frequent headaches   . GERD (gastroesophageal reflux disease)   . Hyperlipidemia   . IBS (irritable bowel syndrome)   . Pituitary insufficiency ([place])   . Thyroid disease   . Urine incontinence     Past Surgical History:  Procedure Laterality Date  . ABDOMINAL HYSTERECTOMY  1990  . BRAIN SURGERY    . BREAST EXCISIONAL BIOPSY Right unsure  . BREAST EXCISIONAL BIOPSY Left unsure  . OVARIAN CYST SURGERY  1994  . REPAIR RECTOCELE  2016   and prolapsed uterus.  . TONSILLECTOMY  1973    There were no vitals filed for this visit.  Subjective Assessment - 04/30/17 1439    Subjective  Lt shoulder/neck is bothering her today, "I don't know if I slept funny."  Rt side is tight, but better overall.  feels Rt side is 50% improved.  (~ 15 min into session pt reported LUE is back to baseline)    Currently in Pain?  Yes    Pain Score  5     Pain Location  Shoulder    Pain Orientation  Left;Upper;Posterior    Pain Descriptors / Indicators  Aching;Throbbing;Dull    Pain Type  Acute pain    Pain Onset  1 to 4 weeks ago    Pain Frequency  Intermittent    Aggravating  Factors   reaching overhead, lifting heavy objects, sleeping on Rt/Lt side    Pain Relieving Factors  not using the arm, heat                No data recorded       [facility] Adult PT Treatment/Exercise - 04/30/17 1446      Exercises   Exercises  Neck      Neck Exercises: Theraband   Shoulder External Rotation  10 reps yellow, supine    Horizontal ABduction  10 reps yellow; supine    Other Theraband Exercises  mod cues needed for proper technique      Neck Exercises: Supine   Neck Retraction  10 reps;5 secs      Shoulder Exercises: Supine   Other Supine Exercises  scapular retraction x 10    Other Supine Exercises  overhead pull with yellow theraband x 10 reps      Shoulder Exercises: Seated   Retraction  Both;15 reps    Retraction Limitations  cues to decrease neck extension compensation    External Rotation  Both;10 reps;Theraband    Theraband Level (Shoulder External Rotation)  Level 1 (Yellow)  Shoulder Exercises: ROM/Strengthening   UBE (Upper Arm Bike)  L2 x 6 min (3' fwd/3' bwd)      Manual Therapy   Manual Therapy  Soft tissue mobilization;Myofascial release    Soft tissue mobilization  Rt UT/LS/cervical paraspinals    Myofascial Release  TPR Rt upper trap, Rt levator scap; Rt cervical paraspinals               PT Short Term Goals - 04/30/17 1523      PT SHORT TERM GOAL #1   Title  Pt will demo consistency and independence with her HEP to improve ROM and decrease pain.     Status  Achieved      PT SHORT TERM GOAL #2   Title  Pt will demo proper use and set up of towel roll to increase her posture awareness throughout the day at work.     Baseline  3/27: educated today, unable prior due to limited visits    Status  Partially Met      PT [place] #3   Title  Pt will report atleast 30% improvement in her symptoms from the start of PT, to allow her to get back into her regular activity without as much difficulty.     Status  Achieved         PT Long Term Goals - 04/01/17 0834      PT LONG TERM GOAL #1   Title  Pt will demo improved UE strength to atleast 4/5 MMT which will allow her to lift objects over head without as much difficulty.     Time  8    Period  Weeks    Status  New      PT LONG TERM GOAL #2   Title  Pt will report being able to reach overhead to grab a cup from the shelf with 60% improvement from the start of PT.     Time  8    Period  Weeks    Status  New      PT LONG TERM GOAL #3   Title  Pt will demo improved active cervical rotation ROM to atleast 60 deg Lt and Rt to assist with turning her head while driving.     Time  8    Period  Weeks    Status  New      PT LONG TERM GOAL #4   Title  Pt will demo improved cervical extension ROM to atleast 50 deg to allow her to look overhead during activity.      Time  8    Period  Weeks    Status  New      PT LONG TERM GOAL #5   Title  Pt will report atleast 60% improvement in her RUE symptoms from the start of therapy, to allow her to complete daily houshold tasks such as mopping without difficulty.     Time  8    Period  Weeks    Status  New            Plan - 04/30/17 1524    Clinical Impression Statement  Pt tolerated session well today, but needs mod cues for new exercises performed today.  Overall pt independent with initial HEP, but did not provide new exercises today due to difficulty with following instructions.  Pt progressing well with PT reporting 50% improvement in overall symptoms.  Will continue to benefit from PT to maximize function.  Rehab Potential  Good    Clinical Impairments Affecting Rehab Potential  limited ability to come 2x/week due to high co-pay    PT Frequency  2x / week    PT Duration  8 weeks    PT Treatment/Interventions  ADLs/Self Care Home Management;Cryotherapy;Electrical Stimulation;Traction;Moist Heat;Therapeutic exercise;Therapeutic activities;Neuromuscular re-education;Patient/family education;Manual  techniques;Passive range of motion;Dry needling;Taping    PT Next Visit Plan  cervical mobilizations/ROM as needed; thoracic mobility; scapular strengthening and stability work     PT Home Exercise Plan  scap squeezes, shoulder ER with yellow band, pec stretch in doorway, chin tucks supine     Consulted and Agree with Plan of Care  Patient       Patient will benefit from skilled therapeutic intervention in order to improve the following deficits and impairments:  Decreased activity tolerance, Impaired flexibility, Impaired UE functional use, Hypomobility, Decreased strength, Decreased range of motion, Increased muscle spasms, Postural dysfunction, Pain, Improper body mechanics  Visit Diagnosis: Cervicalgia  Acute pain of right shoulder  Muscle weakness (generalized)  Muscle spasm of back     Problem List Patient Active Problem List   Diagnosis Date Noted  . Cervicalgia 03/26/2017  . Dysphagia 12/04/2016  . Primary osteoarthritis of both knees 06/03/2016  . Depressive disorder 05/21/2016  . Cough 03/28/2016  . Pituitary insufficiency ([place]) 12/26/2015  . GERD (gastroesophageal reflux disease) 11/06/2015  . IBS (irritable bowel syndrome) 10/16/2015  . Hyperlipidemia 10/16/2015  . Insomnia disorder 10/16/2015  . Anxiety disorder 10/16/2015  . Allergic rhinitis 10/16/2015  . Pituitary adenoma ([place]) 09/26/2015      Laureen Abrahams, PT, DPT 04/30/17 3:27 PM     [facility] [address] [place], [place], [address] Phone: [phone]   Fax:  [phone]  Name: RAELA BOHL MRN: [id number] Date of Birth: Oct 02, 1945    *addendum to resolve episode of care and d/c pt from PT.  [place]  Visits from Start of Care: 3  Current functional level related to goals / functional outcomes: See above for more details    Remaining deficits: See above for more details     Education / Equipment: See above for more details   Plan: Patient agrees to discharge.  Patient goals were partially met. Patient is being discharged due to financial reasons.  ?????     9:51 AM,08/18/17 [place], [place] [facility]

## 2017-05-07 ENCOUNTER — Encounter: Payer: Medicare HMO | Admitting: Physical Therapy

## 2017-05-08 ENCOUNTER — Other Ambulatory Visit: Payer: Self-pay | Admitting: Family Medicine

## 2017-05-08 DIAGNOSIS — K219 Gastro-esophageal reflux disease without esophagitis: Secondary | ICD-10-CM

## 2017-05-27 ENCOUNTER — Other Ambulatory Visit: Payer: Self-pay | Admitting: Family Medicine

## 2017-05-27 ENCOUNTER — Telehealth: Payer: Self-pay | Admitting: *Deleted

## 2017-05-27 MED ORDER — OMEPRAZOLE 40 MG PO CPDR: 40.0000 mg | DELAYED_RELEASE_CAPSULE | Freq: Every day | ORAL | 1 refills | Status: DC

## 2017-05-27 NOTE — Telephone Encounter (Signed)
Note from Pharmacy: Ins will only pay for 1 cap daily. Can we change to the 40 MG caps? Omeprazole 20 MG Cap, 1 cap by mouth twice daily before a meal

## 2017-05-27 NOTE — Telephone Encounter (Signed)
Rx for Omeprazole 40 mg sent to her pharmacy.  Thanks, BJ

## 2017-06-03 ENCOUNTER — Ambulatory Visit (INDEPENDENT_AMBULATORY_CARE_PROVIDER_SITE_OTHER): Payer: Medicare HMO | Admitting: Family Medicine

## 2017-06-03 ENCOUNTER — Encounter: Payer: Self-pay | Admitting: Family Medicine

## 2017-06-03 VITALS — BP 126/70 | HR 81 | Temp 98.3°F | Resp 12 | Ht 63.0 in | Wt 155.1 lb

## 2017-06-03 DIAGNOSIS — J309 Allergic rhinitis, unspecified: Secondary | ICD-10-CM

## 2017-06-03 DIAGNOSIS — F411 Generalized anxiety disorder: Secondary | ICD-10-CM | POA: Diagnosis not present

## 2017-06-03 DIAGNOSIS — K219 Gastro-esophageal reflux disease without esophagitis: Secondary | ICD-10-CM | POA: Diagnosis not present

## 2017-06-03 DIAGNOSIS — K581 Irritable bowel syndrome with constipation: Secondary | ICD-10-CM | POA: Diagnosis not present

## 2017-06-03 DIAGNOSIS — H6123 Impacted cerumen, bilateral: Secondary | ICD-10-CM | POA: Diagnosis not present

## 2017-06-03 DIAGNOSIS — M542 Cervicalgia: Secondary | ICD-10-CM

## 2017-06-03 MED ORDER — IPRATROPIUM BROMIDE 0.03 % NA SOLN: NASAL | 3 refills | Status: DC

## 2017-06-03 MED ORDER — MONTELUKAST SODIUM 10 MG PO TABS: 10.0000 mg | ORAL_TABLET | Freq: Every day | ORAL | 0 refills | Status: DC

## 2017-06-03 MED ORDER — FLUOXETINE HCL 20 MG PO TABS: ORAL_TABLET | ORAL | 0 refills | Status: DC

## 2017-06-03 NOTE — Patient Instructions (Signed)
A few things to remember from today's visit:   Cervicalgia  Gastroesophageal reflux disease, esophagitis presence not specified  Allergic rhinitis, unspecified seasonality, unspecified trigger - Plan: montelukast (SINGULAIR) 10 MG tablet, ipratropium (ATROVENT) 0.03 % nasal spray  Generalized anxiety disorder - Plan: FLUoxetine (PROZAC) 20 MG tablet Continue omeprazole 40 mg daily. Continue following with your GI for irritable bowel syndrome. Because you feel like you may not need Prozac, we are going to start decreasing it slowly.  Alternate Prozac 20 mg and 10 mg every other day, medication was changed to tablet so you can split it.  Singulair 10 mg at bedtime added today. Continue saline nasal irrigations. Atrovent nasal spray my also help.  Continue PT exercises for your neck at home.   Please be sure medication list is accurate. If a new problem present, please set up appointment sooner than planned today.

## 2017-06-03 NOTE — Progress Notes (Signed)
HPI:   Ms.Derotha Viona Gilmore Turrubiates is a 72 y.o. female, who is here today for follow up.   She was last seen on 03/26/17. Cervicalgia, pain has improved with PT. She completed 4 sections , could not afford more.  GERD:  Last visit Omeprazole was increased from 20 mg to 40 mg. Burping and dysphagia have improved.  No abdominal pain, nausea, vomiting, changes in bowel habits, blood in stool or melena. C/O "gassy" feeling, intermittent mild cramps. Exacerbated by certain foods and alleviated by passing gas and defecation.  Colonoscopy 2017. Hx of IBS,she follows with GI.   Today she is c/o hearing loss, ear fullness sensation. She is having difficulty hearing when there is background noise or through the phone. No earache or Hx of trauma. Problem has been gradual and intermittently for months.  Burning and pruritus of eyes and nose. + Nasal congestion. Albuterol inh helped with congestion.  Flonase nasal spray caused nose bleed. Taking Zyrtec 10 mg daily.  Hx of allergies. Symptoms worse for the past 2 weeks. Exacerbated by season changes.  States that years ago she used to get allergy shots.  Anxiety and depression: She is on Prozac 20 mg daily , which she has taken for many years. She feels like she may not need medication. Denies depressed mood or suicidal thoughts.    Review of Systems  Constitutional: Positive for fatigue. Negative for activity change, appetite change, fever and unexpected weight change.  HENT: Positive for congestion, nosebleeds, postnasal drip and rhinorrhea. Negative for mouth sores and trouble swallowing.   Eyes: Negative for redness and visual disturbance.  Respiratory: Negative for cough, shortness of breath and wheezing.   Cardiovascular: Negative for chest pain, palpitations and leg swelling.  Gastrointestinal: Positive for abdominal pain and constipation. Negative for blood in stool, nausea and vomiting.       Negative for changes in  bowel habits.  Genitourinary: Negative for decreased urine volume and hematuria.  Musculoskeletal: Positive for neck pain. Negative for gait problem.  Allergic/Immunologic: Positive for environmental allergies.  Neurological: Negative for syncope, weakness and headaches.  Psychiatric/Behavioral: Negative for confusion. The patient is nervous/anxious.       Current Outpatient Medications on File Prior to Visit  Medication Sig Dispense Refill  . albuterol (PROVENTIL HFA;VENTOLIN HFA) 108 (90 Base) MCG/ACT inhaler Inhale 2 puffs into the lungs every 6 (six) hours as needed for wheezing or shortness of breath. 1 Inhaler 2  . Calcium Carbonate-Vit D-Min (CALCIUM 1200 PO) Take 1 tablet by mouth daily.    . cetirizine (ZYRTEC) 10 MG tablet Take 10 mg by mouth daily.    . cholecalciferol (VITAMIN D) 1000 units tablet Chew 1 tablet by mouth daily.    . famotidine (PEPCID) 20 MG tablet Take 20 mg by mouth daily. 1 tab twice daily    . levothyroxine (SYNTHROID, LEVOTHROID) 50 MCG tablet TAKE ONE TABLET BY MOUTH ONCE DAILY BEFORE BREAKFAST 90 tablet 2  . LINZESS 145 MCG CAPS capsule     . omeprazole (PRILOSEC) 40 MG capsule Take 1 capsule (40 mg total) by mouth daily before breakfast. 90 capsule 1  . pravastatin (PRAVACHOL) 20 MG tablet TAKE 1 TABLET BY MOUTH ONCE DAILY 90 tablet 1  . Probiotic Product (PROBIOTIC-10 PO) Take by mouth.    . Spacer/Aero-Holding Chambers (AEROCHAMBER PLUS) inhaler Use as instructed to use with inahaler. 1 each 1  . tizanidine (ZANAFLEX) 2 MG capsule Take 1 capsule (2 mg total) by mouth 3 (  three) times daily as needed for muscle spasms. 45 capsule 1   No current facility-administered medications on file prior to visit.      Past Medical History:  Diagnosis Date  . Allergy   . Anxiety   . Dyslipidemia   . Frequent headaches   . GERD (gastroesophageal reflux disease)   . Hyperlipidemia   . IBS (irritable bowel syndrome)   . 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  Thyroid disease   . Urine incontinence    No Known Allergies  Social History   Socioeconomic History  . Marital status: Single    Spouse name: Not on file  . Number of children: Not on file  . Years of education: Not on file  . Highest education level: Not on file  Occupational History  . Not on file  Social Needs  . Financial resource strain: Not on file  . Food insecurity:    Worry: Not on file    Inability: Not on file  . Transportation needs:    Medical: Not on file    Non-medical: Not on file  Tobacco Use  . Smoking status: Never Smoker  . Smokeless tobacco: Never Used  Substance and Sexual Activity  . Alcohol use: No  . Drug use: No  . Sexual activity: Never  Lifestyle  . Physical activity:    Days per week: Not on file    Minutes per session: Not on file  . Stress: Not on file  Relationships  . Social connections:    Talks on phone: Not on file    Gets together: Not on file    Attends religious service: Not on file    Active member of club or organization: Not on file    Attends meetings of clubs or organizations: Not on file    Relationship status: Not on file  Other Topics Concern  . Not on file  Social History Narrative  . Not on file    Vitals:   06/03/17 0942  BP: 126/70  Pulse: 81  Resp: 12  Temp: 98.3 F (36.8 C)  SpO2: 95%   Body mass index is 27.48 kg/m.   Physical Exam  Nursing note and vitals reviewed. Constitutional: She is oriented to person, place, and time. She appears well-developed. No distress.  HENT:  Head: Normocephalic and atraumatic.  Mouth/Throat: Oropharynx is clear and moist and mucous membranes are normal.  Cerumen excess bilaterally, unable to see TM. Hypertrophic turbinates, dry nasal mucosa.  Eyes: Pupils are equal, round, and reactive to light. Conjunctivae are normal.  Cardiovascular: Normal rate and regular rhythm.  No murmur heard. Pulses:      Dorsalis pedis pulses are 2+ on the right side, and 2+ on  the left side.  Respiratory: Effort normal and breath sounds normal. No respiratory distress.  GI: Soft. She exhibits no mass. There is no hepatomegaly. There is no tenderness.  Musculoskeletal: She exhibits no edema.       Cervical back: She exhibits decreased range of motion. She exhibits no tenderness and no bony tenderness.  Lymphadenopathy:    She has no cervical adenopathy.  Neurological: She is alert and oriented to person, place, and time. She has normal strength. Gait normal.  Skin: Skin is warm. No rash noted. No erythema.  Psychiatric: Her mood appears anxious.  Well groomed, good eye contact.      ASSESSMENT AND PLAN:   Ms. BETHENNY LOSEE was seen today for follow-up.    1. Cervicalgia  Improved. Continue PT exercises at home. F/U as needed.  2. Gastroesophageal reflux disease, esophagitis presence not specified  Improved. Continue Omeprazole 40 mg daily. GERD precautions.   3. Allergic rhinitis, unspecified seasonality, unspecified trigger  Not well controlled. Singulair 10 mg added. Atrovent nasal spray. Saline nasal irrigations as needed.  - montelukast (SINGULAIR) 10 MG tablet; Take 1 tablet (10 mg total) by mouth at bedtime.  Dispense: 90 tablet; Refill: 0 - ipratropium (ATROVENT) 0.03 % nasal spray; 2 sprays in each nostril twice a day.  Dispense: 30 mL; Refill: 3  4. Generalized anxiety disorder  We will start weaning off medication. Instructed about warning signs. She will alternate 20 and 10 mg. F/U in 3 months,before if needed.  - FLUoxetine (PROZAC) 20 MG tablet; Dose decreased: 20 mg every other day and alternate with10 mg (1/2 tab)  Dispense: 90 tablet; Refill: 0  5. Irritable bowel syndrome with constipation  Educated about Dx. It has been stable. Continue following with GI. Monitor for worsening symptoms.  6. Bilateral hearing loss due to cerumen impaction  Right ear cerumen removed with curette and hearing improved. I  could not remove all cerumen from left ear.  Recommend OTC Debrox.      -Ms. SANIYA TRANCHINA is to return sooner than planned today if new concerns arise.       Rolanda Campa G. Martin[place], MD  [facility]. [place] office.

## 2017-06-16 ENCOUNTER — Encounter: Payer: Self-pay | Admitting: Family Medicine

## 2017-06-16 ENCOUNTER — Ambulatory Visit (INDEPENDENT_AMBULATORY_CARE_PROVIDER_SITE_OTHER): Payer: Medicare HMO | Admitting: Family Medicine

## 2017-06-16 ENCOUNTER — Other Ambulatory Visit: Payer: Self-pay

## 2017-06-16 VITALS — BP 114/68 | HR 73 | Temp 98.5°F | Resp 15 | Ht 63.0 in | Wt 151.6 lb

## 2017-06-16 DIAGNOSIS — J0110 Acute frontal sinusitis, unspecified: Secondary | ICD-10-CM

## 2017-06-16 DIAGNOSIS — J069 Acute upper respiratory infection, unspecified: Secondary | ICD-10-CM | POA: Diagnosis not present

## 2017-06-16 DIAGNOSIS — B9789 Other viral agents as the cause of diseases classified elsewhere: Secondary | ICD-10-CM | POA: Diagnosis not present

## 2017-06-16 MED ORDER — AZITHROMYCIN 250 MG PO TABS: ORAL_TABLET | ORAL | 0 refills | Status: DC

## 2017-06-16 MED ORDER — PREDNISONE 20 MG PO TABS: 60.0000 mg | ORAL_TABLET | Freq: Every day | ORAL | 0 refills | Status: AC

## 2017-06-16 MED ORDER — BENZONATATE 100 MG PO CAPS: 100.0000 mg | ORAL_CAPSULE | Freq: Two times a day (BID) | ORAL | 0 refills | Status: DC | PRN

## 2017-06-16 NOTE — Progress Notes (Signed)
Subjective:    Patient ID: Nancy Herman, female    DOB: December 21, 1945, 72 y.o.   MRN: [id number]  Chief Complaint  Patient presents with  . Sinus Problem    Started Friday, cough, chest pain, headaches, nasal and throat drainage    HPI Patient was seen today for acute concern.  Pt endorses cough, frontal headache/fullness, chest soreness with coughing, and nasal drainage since Friday.  Pt has taken tylenol for the HA, hot tea, gargling with warm salt water, and her regular allergy medicines.  Pt has also had to use her inhaler several times.  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  Pt denies fever, chills, ear pain or pressure.  Past Medical History:  Diagnosis Date  . Allergy   . Anxiety   . Dyslipidemia   . Frequent headaches   . GERD (gastroesophageal reflux disease)   . Hyperlipidemia   . IBS (irritable bowel syndrome)   . 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 Thyroid disease   . Urine incontinence     No Known Allergies  ROS General: Denies fever, chills, night sweats, changes in weight, changes in appetite HEENT: Denies ear pain, changes in vision, rhinorrhea, sore throat  +HA, nasal drainage CV: Denies CP, palpitations, SOB, orthopnea  Pulm: Denies SOB, wheezing  +cough GI: Denies abdominal pain, nausea, vomiting, diarrhea, constipation GU: Denies dysuria, hematuria, frequency, vaginal discharge Msk: Denies muscle cramps, joint pains  +chest soreness Neuro: Denies weakness, numbness, tingling Skin: Denies rashes, bruising Psych: Denies depression, anxiety, hallucinations     Objective:    Blood pressure 114/68, pulse 73, temperature 98.5 F (36.9 C), temperature source Oral, resp. rate 15, height 5\' 3"  (1.6 m), weight 151 lb 9.6 oz (68.8 kg), SpO2 95 %.   Gen. Pleasant, well-nourished, in no distress, normal affect  HEENT: [place]/AT, face symmetric, no scleral icterus, PERRLA, nares patent without drainage, pharynx with mild erythema, no  exudate. TMs normal b/l, no cervical lymphadenopthy Lungs: no accessory muscle use, rhonchi and scattered wheezes throughout Cardiovascular: RRR, no m/r/g, no peripheral edema Neuro:  A&Ox3, CN II-XII intact, normal gait   Wt Readings from Last 3 Encounters:  06/16/17 151 lb 9.6 oz (68.8 kg)  06/03/17 155 lb 2 oz (70.4 kg)  03/26/17 155 lb 4 oz (70.4 kg)    Lab Results  Component Value Date   GLUCOSE 112 (H) 03/03/2017   CHOL 233 (H) 09/30/2016   TRIG 36.0 09/30/2016   HDL 89.50 09/30/2016   LDLCALC 136 (H) 09/30/2016   ALT 14 09/30/2016   AST 18 09/30/2016   NA 143 03/03/2017   K 3.2 (L) 03/03/2017   CL 104 03/03/2017   CREATININE 1.06 03/03/2017   BUN 15 03/03/2017   CO2 30 03/03/2017   TSH 1.50 03/03/2017    Assessment/Plan:  Acute non-recurrent frontal sinusitis  - Plan: azithromycin (ZITHROMAX) 250 MG tablet  Viral URI with cough  -supportive care - Plan: benzonatate (TESSALON) 100 MG capsule, predniSONE (DELTASONE) 20 MG tablet  F/u prn  Grier Mitts, MD

## 2017-06-16 NOTE — Patient Instructions (Signed)
Sinusitis, Adult Sinusitis is soreness and inflammation of your sinuses. Sinuses are hollow spaces in the bones around your face. They are located:  Around your eyes.  In the middle of your forehead.  Behind your nose.  In your cheekbones.  Your sinuses and nasal passages are lined with a stringy fluid (mucus). Mucus normally drains out of your sinuses. When your nasal tissues get inflamed or swollen, the mucus can get trapped or blocked so air cannot flow through your sinuses. This lets bacteria, viruses, and funguses grow, and that leads to infection. Follow these instructions at home: Medicines  Take, use, or apply over-the-counter and prescription medicines only as told by your doctor. These may include nasal sprays.  If you were prescribed an antibiotic medicine, take it as told by your doctor. Do not stop taking the antibiotic even if you start to feel better. Hydrate and Humidify  Drink enough water to keep your pee (urine) clear or pale yellow.  Use a cool mist humidifier to keep the humidity level in your home above 50%.  Breathe in steam for 10-15 minutes, 3-4 times a day or as told by your doctor. You can do this in the bathroom while a hot shower is running.  Try not to spend time in cool or dry air. Rest  Rest as much as possible.  Sleep with your head raised (elevated).  Make sure to get enough sleep each night. General instructions  Put a warm, moist washcloth on your face 3-4 times a day or as told by your doctor. This will help with discomfort.  Wash your hands often with soap and water. If there is no soap and water, use hand sanitizer.  Do not smoke. Avoid being around people who are smoking (secondhand smoke).  Keep all follow-up visits as told by your doctor. This is important. Contact a doctor if:  You have a fever.  Your symptoms get worse.  Your symptoms do not get better within 10 days. Get help right away if:  You have a very bad  headache.  You cannot stop throwing up (vomiting).  You have pain or swelling around your face or eyes.  You have trouble seeing.  You feel confused.  Your neck is stiff.  You have trouble breathing. This information is not intended to replace advice given to you by your health care provider. Make sure you discuss any questions you have with your health care provider. Document Released: 07/10/2007 Document Revised: 09/17/2015 Document Reviewed: 11/16/2014 Elsevier Interactive Patient Education  2018 [employer].  Upper Respiratory Infection, Adult Most upper respiratory infections (URIs) are a viral infection of the air passages leading to the lungs. A URI affects the nose, throat, and upper air passages. The most common type of URI is nasopharyngitis and is typically referred to as "the common cold." URIs run their course and usually go away on their own. Most of the time, a URI does not require medical attention, but sometimes a bacterial infection in the upper airways can follow a viral infection. This is called a secondary infection. Sinus and middle ear infections are common types of secondary upper respiratory infections. Bacterial pneumonia can also complicate a URI. A URI can worsen asthma and chronic obstructive pulmonary disease (COPD). Sometimes, these complications can require emergency medical care and may be life threatening. What are the causes? Almost all URIs are caused by viruses. A virus is a type of germ and can spread from one person to another. What increases  the risk? You may be at risk for a URI if:  You smoke.  You have chronic heart or lung disease.  You have a weakened defense (immune) system.  You are very young or very old.  You have nasal allergies or asthma.  You work in crowded or poorly ventilated areas.  You work in health care facilities or schools.  What are the signs or symptoms? Symptoms typically develop 2-3 days after you come in  contact with a cold virus. Most viral URIs last 7-10 days. However, viral URIs from the influenza virus (flu virus) can last 14-18 days and are typically more severe. Symptoms may include:  Runny or stuffy (congested) nose.  Sneezing.  Cough.  Sore throat.  Headache.  Fatigue.  Fever.  Loss of appetite.  Pain in your forehead, behind your eyes, and over your cheekbones (sinus pain).  Muscle aches.  How is this diagnosed? Your health care provider may diagnose a URI by:  Physical exam.  Tests to check that your symptoms are not due to another condition such as: ? Strep throat. ? Sinusitis. ? Pneumonia. ? Asthma.  How is this treated? A URI goes away on its own with time. It cannot be cured with medicines, but medicines may be prescribed or recommended to relieve symptoms. Medicines may help:  Reduce your fever.  Reduce your cough.  Relieve nasal congestion.  Follow these instructions at home:  Take medicines only as directed by your health care provider.  Gargle warm saltwater or take cough drops to comfort your throat as directed by your health care provider.  Use a warm mist humidifier or inhale steam from a shower to increase air moisture. This may make it easier to breathe.  Drink enough fluid to keep your urine clear or pale yellow.  Eat soups and other clear broths and maintain good nutrition.  Rest as needed.  Return to work when your temperature has returned to normal or as your health care provider advises. You may need to stay home longer to avoid infecting others. You can also use a face mask and careful hand washing to prevent spread of the virus.  Increase the usage of your inhaler if you have asthma.  Do not use any tobacco products, including cigarettes, chewing tobacco, or electronic cigarettes. If you need help quitting, ask your health care provider. How is this prevented? The best way to protect yourself from getting a cold is to  practice good hygiene.  Avoid oral or hand contact with people with cold symptoms.  Wash your hands often if contact occurs.  There is no clear evidence that vitamin C, vitamin E, echinacea, or exercise reduces the chance of developing a cold. However, it is always recommended to get plenty of rest, exercise, and practice good nutrition. Contact a health care provider if:  You are getting worse rather than better.  Your symptoms are not controlled by medicine.  You have chills.  You have worsening shortness of breath.  You have brown or red mucus.  You have yellow or brown nasal discharge.  You have pain in your face, especially when you bend forward.  You have a fever.  You have swollen neck glands.  You have pain while swallowing.  You have white areas in the back of your throat. Get help right away if:  You have severe or persistent: ? Headache. ? Ear pain. ? Sinus pain. ? Chest pain.  You have chronic lung disease and any of the  following: ? Wheezing. ? Prolonged cough. ? Coughing up blood. ? A change in your usual mucus.  You have a stiff neck.  You have changes in your: ? Vision. ? Hearing. ? Thinking. ? Mood. This information is not intended to replace advice given to you by your health care provider. Make sure you discuss any questions you have with your health care provider. Document Released: 07/17/2000 Document Revised: 09/24/2015 Document Reviewed: 04/28/2013 Elsevier Interactive Patient Education  [employer].

## 2017-06-18 ENCOUNTER — Telehealth: Payer: Self-pay | Admitting: Family Medicine

## 2017-06-18 NOTE — Telephone Encounter (Signed)
Copied from [place] [id number]. Topic: Quick Communication - See Telephone Encounter >> Jun 18, 2017 10:37 AM Bea Graff, NT wrote: CRM for notification. See Telephone encounter for: 06/18/17. Pt needs a doctors note that states she was seen on 06/16/17 with Dr. Volanda Napoleon and will not be able to return to work on Monday. Please call pt when letter is ready to be picked up.

## 2017-06-18 NOTE — Telephone Encounter (Signed)
Ok

## 2017-06-18 NOTE — Telephone Encounter (Signed)
Letter completed as requested and filed at front desk for pick up. Pt notified of results/instructions and verbalized understanding.

## 2017-06-18 NOTE — Telephone Encounter (Signed)
Okay to write work note as requested?

## 2017-06-23 ENCOUNTER — Telehealth: Payer: Self-pay | Admitting: Family Medicine

## 2017-06-23 NOTE — Telephone Encounter (Signed)
Spoke with patient, gave instructions on vertigo per Dr. Martinique. Patient verbalized understanding.

## 2017-06-23 NOTE — Telephone Encounter (Unsigned)
Copied from [place] [id number]. Topic: Quick Communication - See Telephone Encounter >> Jun 23, 2017  9:19 AM Percell Belt A wrote: CRM for notification. See Telephone encounter for: 06/23/17.  Pt called in and said that she was seen last week for sinus inf.  She said that she now has vertigo and would like to know what she should do?  She would like to know if something could be called in for her or does she need to come back in?    Best number [phone] [id number] on [address]

## 2017-06-23 NOTE — Telephone Encounter (Signed)
Vertigo is usually benign. The main thing is fall prevention. Follow-up if still having problem in 1 to 2 weeks or if problem gets worse or associated hearing loss, chest pain, difficulty breathing or another worrisome symptom. Thanks, BJ

## 2017-06-23 NOTE — Telephone Encounter (Signed)
Left message to give clinic a call back for lab results.

## 2017-06-23 NOTE — Telephone Encounter (Signed)
Message sent to Dr. Jordan for review. 

## 2017-06-23 NOTE — Telephone Encounter (Signed)
Left message to give clinic a call back concerning vertigo.

## 2017-08-12 ENCOUNTER — Other Ambulatory Visit: Payer: Self-pay | Admitting: Family Medicine

## 2017-08-12 DIAGNOSIS — Z1231 Encounter for screening mammogram for malignant neoplasm of breast: Secondary | ICD-10-CM

## 2017-09-01 ENCOUNTER — Ambulatory Visit (INDEPENDENT_AMBULATORY_CARE_PROVIDER_SITE_OTHER): Payer: Medicare HMO | Admitting: Endocrinology

## 2017-09-01 ENCOUNTER — Encounter: Payer: Self-pay | Admitting: Endocrinology

## 2017-09-01 VITALS — BP 122/70 | HR 98 | Ht 63.0 in | Wt 154.0 lb

## 2017-09-01 DIAGNOSIS — D352 Benign neoplasm of pituitary gland: Secondary | ICD-10-CM

## 2017-09-01 LAB — BASIC METABOLIC PANEL
BUN: 17 mg/dL (ref 6–23)
CO2: 26 mEq/L (ref 19–32)
Calcium: 9.5 mg/dL (ref 8.4–10.5)
Chloride: 104 mEq/L (ref 96–112)
Creatinine, Ser: 1.04 mg/dL (ref 0.40–1.20)
GFR: 67.37 mL/min (ref >60.00)
Glucose, Bld: 107 mg/dL — ABNORMAL HIGH (ref 70–99)
Potassium: 3.9 mEq/L (ref 3.5–5.1)
Sodium: 139 mEq/L (ref 135–145)

## 2017-09-01 LAB — TSH: TSH: 1.26 u[IU]/mL (ref 0.35–4.50)

## 2017-09-01 LAB — T4, FREE: Free T4: 1.04 ng/dL (ref 0.60–1.60)

## 2017-09-01 LAB — LUTEINIZING HORMONE: LH: 5.23 m[IU]/mL

## 2017-09-01 LAB — FOLLICLE STIMULATING HORMONE: FSH: 17.3 m[IU]/mL

## 2017-09-01 NOTE — Progress Notes (Signed)
HPI:   Nancy Herman is a 72 y.o. female, who is here today for 3 months follow up.   She was last seen on 06/03/2017.  Since her last OV she has been seen for acute visit on 06/16/2017 and follow with endocrinologist, Dr. Loanne Drilling on 09/01/2017. Last visit we added Singulair 10 mg daily to help with allergic rhinitis. Atrovent nasal spray was also added and nasal irrigation with saline recommended.  Today she has no concerns about allergy rhinitis, problem seems to have improved with changes in treatment.   Anxiety: She is currently on Prozac, which we started weaning off last visit. She has been taking this medication for many years. She is not sure if symptoms have changed since medication was adjusted. She is complaining of fatigue and sometimes no motivation.  She is not sure if she had these symptoms before we started decreasing the dose of Prozac.  She denies crying spells or suicidal thoughts.  She feels like symptoms are exacerbated by some of her chronic medical problems, mainly IBS. She also mentions that recently she has some issues with her daughter, she does not want to elaborate in this regard.    She is also complaining of constipation, history of IBS and she follows with GI. She was started on Linzess but do to cost she cannot take it daily.  Diffuse abdominal soreness. She has not noted blood in the stool and denies nausea or vomiting.   She has taken Amitiza and not sure about results.  Today she is also complaining about right inguinal pain, which she has had intermittently for about a year. She has not noted any bulge area. Patient is to be aggravated by walking and alleviated by rest. No associated skin rash.  She has difficulty describing pain, may be sharp.  It seems to be associated with pain on posterior aspect of right hip, she is not sure if these 2 are related. No recent trauma. She has not taking OTC medications for pain.  She  also mentions that today she started with mild right cervical pain, she wonders if she "slept wrong." She has history of cervical pain and has completed PT, she is not doing exercises at home as recommended. Pain is not radiated, she has not noted tingling or numbness of upper extremities.   Review of Systems  Constitutional: Positive for fatigue. Negative for activity change, appetite change, fever and unexpected weight change.  HENT: Negative for mouth sores, nosebleeds and trouble swallowing.   Eyes: Negative for redness and visual disturbance.  Respiratory: Positive for cough (chronic). Negative for shortness of breath and wheezing.   Cardiovascular: Negative for chest pain and palpitations.  Gastrointestinal: Positive for constipation. Negative for abdominal distention, blood in stool, nausea and vomiting.       Negative for changes in bowel habits.  Genitourinary: Negative for decreased urine volume, dysuria, hematuria, vaginal bleeding and vaginal discharge.  Musculoskeletal: Positive for neck pain. Negative for gait problem.  Allergic/Immunologic: Positive for environmental allergies.  Neurological: Negative for syncope, weakness and headaches.  Psychiatric/Behavioral: Positive for sleep disturbance. The patient is nervous/anxious.      Current Outpatient Medications on File Prior to Visit  Medication Sig Dispense Refill  . albuterol (PROVENTIL HFA;VENTOLIN HFA) 108 (90 Base) MCG/ACT inhaler Inhale 2 puffs into the lungs every 6 (six) hours as needed for wheezing or shortness of breath. 1 Inhaler 2  . Calcium Carbonate-Vit D-Min (CALCIUM 1200 PO) Take 1 tablet by  mouth daily.    . cetirizine (ZYRTEC) 10 MG tablet Take 10 mg by mouth daily.    . cholecalciferol (VITAMIN D) 1000 units tablet Chew 1 tablet by mouth daily.    . famotidine (PEPCID) 20 MG tablet Take 20 mg by mouth daily. 1 tab twice daily    . ipratropium (ATROVENT) 0.03 % nasal spray 2 sprays in each nostril twice a  day. 30 mL 3  . levothyroxine (SYNTHROID, LEVOTHROID) 50 MCG tablet TAKE ONE TABLET BY MOUTH ONCE DAILY BEFORE BREAKFAST 90 tablet 2  . LINZESS 145 MCG CAPS capsule     . montelukast (SINGULAIR) 10 MG tablet Take 1 tablet (10 mg total) by mouth at bedtime. 90 tablet 0  . omeprazole (PRILOSEC) 40 MG capsule Take 1 capsule (40 mg total) by mouth daily before breakfast. 90 capsule 1  . pravastatin (PRAVACHOL) 20 MG tablet TAKE 1 TABLET BY MOUTH ONCE DAILY 90 tablet 1  . Probiotic Product (PROBIOTIC-10 PO) Take by mouth.    . Spacer/Aero-Holding Chambers (AEROCHAMBER PLUS) inhaler Use as instructed to use with inahaler. 1 each 1   No current facility-administered medications on file prior to visit.      Past Medical History:  Diagnosis Date  . Allergy   . Anxiety   . Dyslipidemia   . Frequent headaches   . GERD (gastroesophageal reflux disease)   . Hyperlipidemia   . IBS (irritable bowel syndrome)   . 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 Thyroid disease   . Urine incontinence    No Known Allergies  Social History   Socioeconomic History  . Marital status: Single    Spouse name: Not on file  . Number of children: Not on file  . Years of education: Not on file  . Highest education level: Not on file  Occupational History  . Not on file  Social Needs  . Financial resource strain: Not on file  . Food insecurity:    Worry: Not on file    Inability: Not on file  . Transportation needs:    Medical: Not on file    Non-medical: Not on file  Tobacco Use  . Smoking status: Never Smoker  . Smokeless tobacco: Never Used  Substance and Sexual Activity  . Alcohol use: No  . Drug use: No  . Sexual activity: Never  Lifestyle  . Physical activity:    Days per week: Not on file    Minutes per session: Not on file  . Stress: Not on file  Relationships  . Social connections:    Talks on phone: Not on file    Gets together: Not on file    Attends religious service: Not on file     Active member of club or organization: Not on file    Attends meetings of clubs or organizations: Not on file    Relationship status: Not on file  Other Topics Concern  . Not on file  Social History Narrative  . Not on file    Vitals:   09/02/17 0934  BP: 118/70  Pulse: 70  Resp: 12  Temp: 98.6 F (37 C)  SpO2: 96%   Body mass index is 27.19 kg/m.   Physical Exam  Nursing note and vitals reviewed. Constitutional: She is oriented to person, place, and time. She appears well-developed and well-nourished. She does not appear ill. No distress.  HENT:  Head: Normocephalic and atraumatic.  Mouth/Throat: Oropharynx is clear and moist and mucous membranes are normal.  2 mm papular lesion on right lateral aspect of tongue. No tender or erythematous, mildly whitish.   Eyes: Conjunctivae are normal. No scleral icterus.  Cardiovascular: Normal rate and regular rhythm.  No murmur heard. Respiratory: Effort normal and breath sounds normal. No respiratory distress.  GI: Soft. Bowel sounds are normal. She exhibits no distension and no mass. There is no hepatomegaly. There is generalized tenderness ("sore"). There is no rigidity and no guarding. A hernia is present. Hernia confirmed positive in the right inguinal area (?  Small and tender.).  Musculoskeletal: She exhibits no edema.       Cervical back: She exhibits decreased range of motion (Mild.). She exhibits no tenderness and no bony tenderness.  Lymphadenopathy:    She has no cervical adenopathy.  Neurological: She is alert and oriented to person, place, and time. She has normal strength. Gait normal.  Skin: Skin is warm. No rash noted. No erythema.  Psychiatric: Her mood appears anxious.  Well groomed, good eye contact.    ASSESSMENT AND PLAN:   Ms. KIMBERL VIG was seen today for 3 months follow-up.  Orders Placed This Encounter  Procedures  . CT Abdomen Pelvis W Contrast    IBS (irritable bowel syndrome) This  is a chronic problem. She is currently following with GI. She is not sure if Amitiza helped in the past but she would like to try.  She will hold on Linzess while she is taking Amitiza. Continue adequate fluid and fiber intake. Instructed about warning signs.  Cervicalgia Chronic. Recently she completed PT, which helped. Recommend resuming cervical PT exercises at home. Follow-up as needed.   Anxiety disorder Multiple complaints. She is not sure about changes in symptoms. For now she will go back to Prozac 20 mg daily. Follow-up in 2 months, before if needed.  Allergic rhinitis She is not symptomatic at this time, she has no complaints in this regard. No changes in current management. Follow-up as needed.    Groin pain, right  We discussed possible etiologies, including hip OA, hernia, tendinitis among some. This seems to be a chronic problem, she is not sure if this is getting worse. On physical examination?  Inguinal hernia defect and tenderness. We will arrange abdominal/pelvic CT. Instructed about warning signs.  - CT Abdomen Pelvis W Contrast; Future  Tongue lesion  We discussed possible etiologies, low probability of having a serious process. She is not sure if lesion has grown/changed, recommend monitoring and follow-up with dentist.     Estera Ozier G. Martinique, MD  [facility]. [place] office.

## 2017-09-01 NOTE — Patient Instructions (Addendum)
blood tests are requested for you today.  We'll let you know about the results.   Let's recheck the MRI.  you will receive a phone call, about a day and time for an appointment. Please come back for a follow-up appointment in 6 months.

## 2017-09-01 NOTE — Progress Notes (Signed)
Subjective:    Patient ID: Nancy Herman, female    DOB: 1945/11/01, 72 y.o.   MRN: [id number]  HPI Pt returns for f/u of pituitary adenoma (was 1.8 mm diameter; dx'ed and resected in 2017 (in [place]); she did not have XRT; she now takes only synthroid;  f/u MRI in late 2017 showed no residual tumor).  pt states she feels well in general.  She had these pit function results in 2017:  FSH/LH: normal (not menopausal).   Prol: normal ACTH: stim test normal GH: IGF-1 was normal VP: USG=1.010, VP was undetectable; 24-HR urine was 2300 ml.   TSH: euthyroid on synthroid.  Main symptom is chronic mild pain at the abdomen, and assoc nausea.  Past Medical History:  Diagnosis Date  . Allergy   . Anxiety   . Dyslipidemia   . Frequent headaches   . GERD (gastroesophageal reflux disease)   . Hyperlipidemia   . IBS (irritable bowel syndrome)   . 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 Thyroid disease   . Urine incontinence     Past Surgical History:  Procedure Laterality Date  . ABDOMINAL HYSTERECTOMY  1990  . BRAIN SURGERY    . BREAST EXCISIONAL BIOPSY Right unsure  . BREAST EXCISIONAL BIOPSY Left unsure  . OVARIAN CYST SURGERY  1994  . REPAIR RECTOCELE  2016   and prolapsed uterus.  . TONSILLECTOMY  1973    Social History   Socioeconomic History  . Marital status: Single    Spouse name: Not on file  . Number of children: Not on file  . Years of education: Not on file  . Highest education level: Not on file  Occupational History  . Not on file  Social Needs  . Financial resource strain: Not on file  . Food insecurity:    Worry: Not on file    Inability: Not on file  . Transportation needs:    Medical: Not on file    Non-medical: Not on file  Tobacco Use  . Smoking status: Never Smoker  . Smokeless tobacco: Never Used  Substance and Sexual Activity  . Alcohol use: No  . Drug use: No  . Sexual activity: Never  Lifestyle  . Physical activity:    Days per week: Not on  file    Minutes per session: Not on file  . Stress: Not on file  Relationships  . Social connections:    Talks on phone: Not on file    Gets together: Not on file    Attends religious service: Not on file    Active member of club or organization: Not on file    Attends meetings of clubs or organizations: Not on file    Relationship status: Not on file  . Intimate partner violence:    Fear of current or ex partner: Not on file    Emotionally abused: Not on file    Physically abused: Not on file    Forced sexual activity: Not on file  Other Topics Concern  . Not on file  Social History Narrative  . Not on file    Current Outpatient Medications on File Prior to Visit  Medication Sig Dispense Refill  . albuterol (PROVENTIL HFA;VENTOLIN HFA) 108 (90 Base) MCG/ACT inhaler Inhale 2 puffs into the lungs every 6 (six) hours as needed for wheezing or shortness of breath. 1 Inhaler 2  . Calcium Carbonate-Vit D-Min (CALCIUM 1200 PO) Take 1 tablet by mouth daily.    . cetirizine (  ZYRTEC) 10 MG tablet Take 10 mg by mouth daily.    . cholecalciferol (VITAMIN D) 1000 units tablet Chew 1 tablet by mouth daily.    . famotidine (PEPCID) 20 MG tablet Take 20 mg by mouth daily. 1 tab twice daily    . FLUoxetine (PROZAC) 20 MG tablet Dose decreased: 20 mg every other day and alternate with10 mg (1/2 tab) 90 tablet 0  . ipratropium (ATROVENT) 0.03 % nasal spray 2 sprays in each nostril twice a day. 30 mL 3  . levothyroxine (SYNTHROID, LEVOTHROID) 50 MCG tablet TAKE ONE TABLET BY MOUTH ONCE DAILY BEFORE BREAKFAST 90 tablet 2  . LINZESS 145 MCG CAPS capsule     . montelukast (SINGULAIR) 10 MG tablet Take 1 tablet (10 mg total) by mouth at bedtime. 90 tablet 0  . omeprazole (PRILOSEC) 40 MG capsule Take 1 capsule (40 mg total) by mouth daily before breakfast. 90 capsule 1  . pravastatin (PRAVACHOL) 20 MG tablet TAKE 1 TABLET BY MOUTH ONCE DAILY 90 tablet 1  . Probiotic Product (PROBIOTIC-10 PO) Take by  mouth.    . Spacer/Aero-Holding Chambers (AEROCHAMBER PLUS) inhaler Use as instructed to use with inahaler. 1 each 1   No current facility-administered medications on file prior to visit.     No Known Allergies  Family History  Problem Relation Age of Onset  . Heart disease Mother   . Alcohol abuse Mother   . Drug abuse Mother   . Heart disease Father   . Drug abuse Father   . Alcohol abuse Father     BP 122/70 (BP Location: Left Arm, Patient Position: Sitting, Cuff Size: Normal)   Pulse 98   Ht 5\' 3"  (1.6 m)   Wt 154 lb (69.9 kg)   LMP  (LMP Unknown)   SpO2 93%   BMI 27.28 kg/m   Review of Systems Denies headache, but she has anxiety with MRI.      Objective:   Physical Exam VITAL SIGNS:  See vs page GENERAL: no distress NECK: There is no palpable thyroid enlargement.  No thyroid nodule is palpable.  No palpable lymphadenopathy at the anterior neck.        Assessment & Plan:  abd pain: I told pt this is not pituitary-related--prob related to IBS pituitary adenoma: due form recheck Hypothyroidism: recheck today Anxiety: she needs sedative for MRI  Patient Instructions  blood tests are requested for you today.  We'll let you know about the results.   Let's recheck the MRI.  you will receive a phone call, about a day and time for an appointment. Please come back for a follow-up appointment in 6 months.

## 2017-09-02 ENCOUNTER — Ambulatory Visit (INDEPENDENT_AMBULATORY_CARE_PROVIDER_SITE_OTHER): Payer: Medicare HMO | Admitting: Family Medicine

## 2017-09-02 ENCOUNTER — Other Ambulatory Visit: Payer: Self-pay | Admitting: *Deleted

## 2017-09-02 ENCOUNTER — Encounter: Payer: Self-pay | Admitting: Family Medicine

## 2017-09-02 VITALS — BP 118/70 | HR 70 | Temp 98.6°F | Resp 12 | Ht 63.0 in | Wt 153.5 lb

## 2017-09-02 DIAGNOSIS — K581 Irritable bowel syndrome with constipation: Secondary | ICD-10-CM | POA: Diagnosis not present

## 2017-09-02 DIAGNOSIS — R1031 Right lower quadrant pain: Secondary | ICD-10-CM

## 2017-09-02 DIAGNOSIS — J309 Allergic rhinitis, unspecified: Secondary | ICD-10-CM | POA: Diagnosis not present

## 2017-09-02 DIAGNOSIS — F411 Generalized anxiety disorder: Secondary | ICD-10-CM | POA: Diagnosis not present

## 2017-09-02 DIAGNOSIS — M542 Cervicalgia: Secondary | ICD-10-CM

## 2017-09-02 DIAGNOSIS — K148 Other diseases of tongue: Secondary | ICD-10-CM

## 2017-09-02 MED ORDER — FLUOXETINE HCL 20 MG PO TABS: 20.0000 mg | ORAL_TABLET | Freq: Every day | ORAL | 0 refills | Status: DC

## 2017-09-02 MED ORDER — LUBIPROSTONE 8 MCG PO CAPS: 8.0000 ug | ORAL_CAPSULE | Freq: Two times a day (BID) | ORAL | 0 refills | Status: AC

## 2017-09-02 NOTE — Assessment & Plan Note (Signed)
Multiple complaints. She is not sure about changes in symptoms. For now she will go back to Prozac 20 mg daily. Follow-up in 2 months, before if needed.

## 2017-09-02 NOTE — Assessment & Plan Note (Signed)
Chronic. Recently she completed PT, which helped. Recommend resuming cervical PT exercises at home. Follow-up as needed.

## 2017-09-02 NOTE — Assessment & Plan Note (Signed)
She is not symptomatic at this time, she has no complaints in this regard. No changes in current management. Follow-up as needed.

## 2017-09-02 NOTE — Patient Instructions (Addendum)
A few things to remember from today's visit:   Allergic rhinitis, unspecified seasonality, unspecified trigger  Generalized anxiety disorder - Plan: FLUoxetine (PROZAC) 20 MG tablet  Irritable bowel syndrome with constipation - Plan: lubiprostone (AMITIZA) 8 MCG capsule  Groin pain, right - Plan: CT Abdomen Pelvis W Contrast  Try Amitiza instead Linzess and monitor for changes.  Start PT neck exercises.  ? Right inguinal hernia. CT will be arranged. Increased Prozac back to 20 mg and monitor for changes.    Please be sure medication list is accurate. If a new problem present, please set up appointment sooner than planned today.

## 2017-09-02 NOTE — Assessment & Plan Note (Signed)
This is a chronic problem. She is currently following with GI. She is not sure if Amitiza helped in the past but she would like to try.  She will hold on Linzess while she is taking Amitiza. Continue adequate fluid and fiber intake. Instructed about warning signs.

## 2017-09-04 ENCOUNTER — Ambulatory Visit (INDEPENDENT_AMBULATORY_CARE_PROVIDER_SITE_OTHER)
Admission: RE | Admit: 2017-09-04 | Discharge: 2017-09-04 | Disposition: A | Discharge status: Home / Self Care | Payer: Medicare HMO | Source: Ambulatory Visit | Attending: Family Medicine | Admitting: Family Medicine

## 2017-09-04 DIAGNOSIS — R1031 Right lower quadrant pain: Secondary | ICD-10-CM

## 2017-09-04 MED ORDER — IOPAMIDOL (ISOVUE-300) INJECTION 61%
100.0000 mL | Freq: Once | INTRAVENOUS | Status: AC | PRN
  Administered 2017-09-04: 100 mL via INTRAVENOUS

## 2017-09-05 ENCOUNTER — Other Ambulatory Visit: Payer: Self-pay | Admitting: Family Medicine

## 2017-09-05 DIAGNOSIS — J309 Allergic rhinitis, unspecified: Secondary | ICD-10-CM

## 2017-09-09 ENCOUNTER — Encounter: Payer: Self-pay | Admitting: Family Medicine

## 2017-09-11 ENCOUNTER — Telehealth: Payer: Self-pay | Admitting: Family Medicine

## 2017-09-11 ENCOUNTER — Ambulatory Visit
Admission: RE | Admit: 2017-09-11 | Discharge: 2017-09-11 | Disposition: A | Discharge status: Home / Self Care | Payer: Medicare HMO | Source: Ambulatory Visit | Attending: Family Medicine | Admitting: Family Medicine

## 2017-09-11 DIAGNOSIS — Z1231 Encounter for screening mammogram for malignant neoplasm of breast: Secondary | ICD-10-CM

## 2017-09-11 NOTE — Telephone Encounter (Signed)
Copied from [place] [id number]. Topic: Quick Communication - See Telephone Encounter >> Sep 11, 2017 11:45 AM Burchel, Abbi R wrote: CRM for notification. See Telephone encounter for: 09/11/17.  Pt requesting CT results, please call pt to advise.

## 2017-09-12 ENCOUNTER — Other Ambulatory Visit: Payer: Self-pay | Admitting: Family Medicine

## 2017-09-12 DIAGNOSIS — R928 Other abnormal and inconclusive findings on diagnostic imaging of breast: Secondary | ICD-10-CM

## 2017-09-12 NOTE — Telephone Encounter (Signed)
Message sent to Dr. Jordan for review. 

## 2017-09-12 NOTE — Telephone Encounter (Signed)
Sent through my chart on 09/09/17.  Suda Forbess Martinique, MD

## 2017-09-17 ENCOUNTER — Ambulatory Visit
Admission: RE | Admit: 2017-09-17 | Discharge: 2017-09-17 | Disposition: A | Discharge status: Home / Self Care | Payer: Medicare HMO | Source: Ambulatory Visit | Attending: Family Medicine | Admitting: Family Medicine

## 2017-09-17 ENCOUNTER — Other Ambulatory Visit: Payer: Self-pay | Admitting: Family Medicine

## 2017-09-17 DIAGNOSIS — R928 Other abnormal and inconclusive findings on diagnostic imaging of breast: Secondary | ICD-10-CM

## 2017-09-17 DIAGNOSIS — N641 Fat necrosis of breast: Secondary | ICD-10-CM

## 2017-09-18 ENCOUNTER — Telehealth: Payer: Self-pay

## 2017-09-18 DIAGNOSIS — D352 Benign neoplasm of pituitary gland: Secondary | ICD-10-CM

## 2017-09-18 NOTE — Telephone Encounter (Signed)
Ok, I placed order

## 2017-09-18 NOTE — Telephone Encounter (Signed)
Mirimam called from GI and is requesting the order for CT on brain be changed to with and without so that they can get the patient scheduled

## 2017-09-23 ENCOUNTER — Telehealth: Payer: Self-pay | Admitting: Endocrinology

## 2017-09-23 DIAGNOSIS — E230 Hypopituitarism: Secondary | ICD-10-CM

## 2017-09-23 NOTE — Telephone Encounter (Signed)
Please advise 

## 2017-09-23 NOTE — Telephone Encounter (Signed)
Patient will be having a MRI done and would like to see if Dr Loanne Drilling could send in a medication before she has this done  Please advise    [address] [place], [place]

## 2017-09-23 NOTE — Addendum Note (Signed)
Addended by: Renato Shin on: 09/23/2017 12:17 PM   Modules accepted: Orders

## 2017-09-23 NOTE — Telephone Encounter (Signed)
Ok, I have reentered the order, with oral sedation request

## 2017-09-25 ENCOUNTER — Telehealth: Payer: Self-pay | Admitting: Endocrinology

## 2017-09-25 DIAGNOSIS — D352 Benign neoplasm of pituitary gland: Secondary | ICD-10-CM

## 2017-09-25 DIAGNOSIS — E230 Hypopituitarism: Secondary | ICD-10-CM

## 2017-09-25 NOTE — Telephone Encounter (Signed)
I have reordered MRI, with anti-anxiety request.

## 2017-09-25 NOTE — Telephone Encounter (Signed)
Please advise 

## 2017-09-25 NOTE — Telephone Encounter (Signed)
Patient is having a MRI next week. She is requesting medication for her claustrophobia to sent in to [facility] on [address]. She needs the medication before she has the MRI. This is patient's 2nd request, she has not heard anything back from our office. Please call patient at ph# [phone] to advise/let her know when her script has been sent in.

## 2017-09-26 ENCOUNTER — Telehealth: Payer: Self-pay | Admitting: Endocrinology

## 2017-09-26 MED ORDER — TRIAZOLAM 0.125 MG PO TABS: ORAL_TABLET | ORAL | 0 refills | Status: DC

## 2017-09-26 NOTE — Telephone Encounter (Signed)
I called & LVM advising patient that due to the nature of the medication that she really needed a driver there as well as home.

## 2017-09-26 NOTE — Telephone Encounter (Signed)
Patient needs RX for claustrophobia before her MRI-she was told that the facility that does the MRI would supply medication. Patient called the facility and was told they do NOT supply medication. Please send Rx to [facility] on [address]. Please call patient at ph# [phone] to let her know that her Rx has been sent in.

## 2017-09-26 NOTE — Telephone Encounter (Signed)
I have sent a prescription to your pharmacy.  You will need a driver there and home.

## 2017-09-26 NOTE — Telephone Encounter (Signed)
Called and spoke to patient informing her that orders have been placed and that the anti-anxiety would be administered before MRI per Dr.Ellison. Understanding verbalized nothing further needed at this time.

## 2017-09-29 ENCOUNTER — Other Ambulatory Visit: Payer: Self-pay | Admitting: Endocrinology

## 2017-10-04 ENCOUNTER — Ambulatory Visit
Admission: RE | Admit: 2017-10-04 | Discharge: 2017-10-04 | Disposition: A | Discharge status: Home / Self Care | Payer: Medicare HMO | Source: Ambulatory Visit | Attending: Endocrinology | Admitting: Endocrinology

## 2017-10-04 DIAGNOSIS — D352 Benign neoplasm of pituitary gland: Secondary | ICD-10-CM

## 2017-10-04 MED ORDER — GADOBENATE DIMEGLUMINE 529 MG/ML IV SOLN
7.0000 mL | Freq: Once | INTRAVENOUS | Status: AC | PRN
  Administered 2017-10-04: 7 mL via INTRAVENOUS

## 2017-10-16 ENCOUNTER — Other Ambulatory Visit: Payer: Self-pay | Admitting: Family Medicine

## 2017-10-27 NOTE — Progress Notes (Signed)
Subjective:   Nancy Herman is a 72 y.o. female who presents for Medicare Annual (Subsequent) preventive examination.   Reports health as good  Had a great grand son;  [employer] for [employer] 20 to 30 hours a week  Stopped this year  2 children   Diet Chol/hdl 3;  Trig 36  Breakfast fiber 1 cereal mixed kashi cereal, one piece of bacon, toast and 1/2 glass of juice  Lunch - sandwich; or salad - may be her largest meal  Dinner; sometimes light    Exercise Cleans the home, was going [facility] [employer];  Her and friend walking at the [place] another med but this was not as good Coping with it    Health Maintenance Due  Topic Date Due  . Hepatitis C Screening  01/15/1946  . TETANUS/TDAP  08/20/1966  . PNA vac Low Risk Adult (2 of 2 - PPSV23) 08/04/2017  . INFLUENZA VACCINE  09/04/2017   Will take Hep C at her next blood draw   To note, had her PSV 23 on July 1st, same month has her 11 Birthday. Will discuss the need for another with Dr. Martinique in October  Colonoscopy reported done 04/2014 and due in 10 years Dr. Collene Mares is her new doctor, as she was in [place] and she was getting her LInzess there.   Mammogram 09/11/2017   Educated regarding tetanus/ declined today due to oop cost   Flu vaccine; out of high dose at clinic Will take in October when she sees Dr. Martinique  Bone density 10/2016  -1.4  Currently taking calcium and Vit D  Them most important is to get back to walking      Objective:     Vitals: BP 126/80   Pulse 82   Ht 5\' 3"  (1.6 m)   Wt 154 lb 1 oz (69.9 kg)   LMP  (LMP Unknown)   SpO2 98%   BMI 27.29 kg/m   Body mass index is 27.29 kg/m.  Advanced Directives 10/28/2017 03/31/2017 01/11/2017 10/24/2016 08/29/2016  Does Patient Have a Medical Advance Directive? No No No No No  Would patient like information on creating a medical advance directive? - No - Patient declined - - -   Gave  her information and [place] form at her request  Tobacco Social History   Tobacco Use  Smoking Status Never Smoker  Smokeless Tobacco Never Used     Counseling given: Yes   Clinical Intake:       Past Medical History:  Diagnosis Date  . Allergy   . Anxiety   . Dyslipidemia   . Frequent headaches   . GERD (gastroesophageal reflux disease)   . Hyperlipidemia   . IBS (irritable bowel syndrome)   . 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 Thyroid disease   . Urine incontinence    Past Surgical History:  Procedure Laterality Date  . ABDOMINAL HYSTERECTOMY  1990  . BRAIN SURGERY    . BREAST EXCISIONAL BIOPSY Right unsure  . BREAST EXCISIONAL BIOPSY Left unsure  . OVARIAN CYST SURGERY  1994  . REPAIR RECTOCELE  2016   and prolapsed uterus.  . TONSILLECTOMY  1973   Family History  Problem Relation Age of Onset  . Heart disease Mother   . Alcohol abuse Mother   . Drug abuse Mother   . Heart disease Father   . Drug abuse Father   .  Alcohol abuse Father    Social History   Socioeconomic History  . Marital status: Single    Spouse name: Not on file  . Number of children: Not on file  . Years of education: Not on file  . Highest education level: Not on file  Occupational History  . Not on file  Social Needs  . Financial resource strain: Not on file  . Food insecurity:    Worry: Not on file    Inability: Not on file  . Transportation needs:    Medical: Not on file    Non-medical: Not on file  Tobacco Use  . Smoking status: Never Smoker  . Smokeless tobacco: Never Used  Substance and Sexual Activity  . Alcohol use: No  . Drug use: No  . Sexual activity: Never  Lifestyle  . Physical activity:    Days per week: Not on file    Minutes per session: Not on file  . Stress: Not on file  Relationships  . Social connections:    Talks on phone: Not on file    Gets together: Not on file    Attends religious service: Not on file    Active member of club or  organization: Not on file    Attends meetings of clubs or organizations: Not on file    Relationship status: Not on file  Other Topics Concern  . Not on file  Social History Narrative  . Not on file    Outpatient Encounter Medications as of 10/28/2017  Medication Sig  . albuterol (PROVENTIL HFA;VENTOLIN HFA) 108 (90 Base) MCG/ACT inhaler Inhale 2 puffs into the lungs every 6 (six) hours as needed for wheezing or shortness of breath.  . Calcium Carbonate-Vit D-Min (CALCIUM 1200 PO) Take 1 tablet by mouth daily.  . cetirizine (ZYRTEC) 10 MG tablet Take 10 mg by mouth daily.  . cholecalciferol (VITAMIN D) 1000 units tablet Chew 1 tablet by mouth daily.  . famotidine (PEPCID) 20 MG tablet Take 20 mg by mouth daily. 1 tab twice daily  . FLUoxetine (PROZAC) 20 MG tablet Take 1 tablet (20 mg total) by mouth daily. Dose decreased: 20 mg every other day and alternate with10 mg (1/2 tab)  . ipratropium (ATROVENT) 0.03 % nasal spray 2 sprays in each nostril twice a day.  . levothyroxine (SYNTHROID, LEVOTHROID) 50 MCG tablet TAKE 1 TABLET BY MOUTH ONCE DAILY BEFORE BREAKFAST  . montelukast (SINGULAIR) 10 MG tablet TAKE 1 TABLET BY MOUTH AT BEDTIME  . omeprazole (PRILOSEC) 40 MG capsule Take 1 capsule (40 mg total) by mouth daily before breakfast.  . pravastatin (PRAVACHOL) 20 MG tablet TAKE 1 TABLET BY MOUTH ONCE DAILY  . Probiotic Product (PROBIOTIC-10 PO) Take by mouth.  . Spacer/Aero-Holding Chambers (AEROCHAMBER PLUS) inhaler Use as instructed to use with inahaler.  Marland Kitchen LINZESS 145 MCG CAPS capsule   . triazolam (HALCION) 0.125 MG tablet Take 1/2 hr before MRI (Patient not taking: Reported on 10/28/2017)   No facility-administered encounter medications on file as of 10/28/2017.     Activities of Daily Living In your present state of health, do you have any difficulty performing the following activities: 10/28/2017  Hearing? N  Vision? N  Difficulty concentrating or making decisions? N  Walking  or climbing stairs? N  Dressing or bathing? N  Doing errands, shopping? N  Preparing Food and eating ? N  Using the Toilet? N  In the past six months, have you accidently leaked urine? N  Do you  have problems with loss of bowel control? Y  Comment we are trying to get linzess  Managing your Medications? N  Managing your Finances? N  Housekeeping or managing your Housekeeping? N  Some recent data might be hidden    Patient Care Team: Martinique, Betty G, MD as PCP - General (Family Medicine)    Assessment:   This is a routine wellness examination for [place].  Exercise Activities and Dietary recommendations Current Exercise Habits: Home exercise routine, Time (Minutes): 60, Frequency (Times/Week): 3, Weekly Exercise (Minutes/Week): 180, Intensity: Moderate(will get going)  Goals    . Exercise 150 min/wk Moderate Activity     Will start back walking At least 50 three days a week     . Exercise 150 minutes per week (moderate activity)     Will start walking Find your schedule; get active  Be social some of the time         Fall Risk Fall Risk  10/28/2017 10/24/2016  Falls in the past year? No No     Depression Screen PHQ 2/9 Scores 10/28/2017 06/16/2017 10/24/2016  PHQ - 2 Score 0 0 0  PHQ- 9 Score - 0 -     Cognitive Function MMSE - Mini Mental State Exam 10/28/2017  Not completed: (No Data)     Ad8 score reviewed for issues:  Issues making decisions:  Less interest in hobbies / activities:  Repeats questions, stories (family complaining):  Trouble using ordinary gadgets (microwave, computer, phone):  Forgets the month or year:   Mismanaging finances:   Remembering appts:  Daily problems with thinking and/or memory: Ad8 score is=        Immunization History  Administered Date(s) Administered  . Influenza, High Dose Seasonal PF 11/06/2015, 10/24/2016  . Pneumococcal Conjugate-13 09/04/2013  . Pneumococcal Polysaccharide-23 08/04/2012  . Zoster  Recombinat (Shingrix) 04/04/2017, 07/05/2017      Screening Tests Health Maintenance  Topic Date Due  . Hepatitis C Screening  Jan 20, 1946  . TETANUS/TDAP  08/20/1966  . PNA vac Low Risk Adult (2 of 2 - PPSV23) 08/04/2017  . INFLUENZA VACCINE  09/04/2017  . MAMMOGRAM  09/12/2019  . COLONOSCOPY  04/04/2024  . DEXA SCAN  Completed        Plan:      PCP Notes   Health Maintenance Will take Hep C at her next blood draw   To note, had her PSV 23 on July 1st, same month has her 57 Birthday. Will discuss the need for another with Dr. Martinique in October  Colonoscopy reported done 04/2014 and due in 10 years Dr. Collene Mares is her new doctor, as she was in [place] and she was getting her LInzess there.   Mammogram 09/11/2017   Educated regarding tetanus/ declined today due to oop cost   Flu vaccine; out of high dose at clinic Will take in October when she sees Dr. Martinique  Bone density 10/2016  -1.4  Currently taking calcium and Vit D  Them most important is to get back to walking    Abnormal Screens  Minor hearing issues. Will consider hearing test;   Referrals  none  Patient concerns; Can't afford linzess; given patient assistance form and will try to complete.   Nurse Concerns; Discussed setting goals, getting active and re-engaging since her return to [place] from [place] for 10 years.    Next PCP apt 10/20 will take her high dose flu vaccine then as these were not available in the practice today  Discussed  taking earlier at the pharmacy but she did not want to do this.      I have personally reviewed and noted the following in the patient's chart:   . Medical and social history . Use of alcohol, tobacco or illicit drugs  . Current medications and supplements . Functional ability and status . Nutritional status . Physical activity . Advanced directives . List of other physicians . Hospitalizations, surgeries, and ER visits in previous 12 months . Vitals . Screenings to  include cognitive, depression, and falls . Referrals and appointments  In addition, I have reviewed and discussed with patient certain preventive protocols, quality metrics, and best practice recommendations. A written personalized care plan for preventive services as well as general preventive health recommendations were provided to patient.     Wynetta Fines, RN  10/28/2017

## 2017-10-28 ENCOUNTER — Ambulatory Visit (INDEPENDENT_AMBULATORY_CARE_PROVIDER_SITE_OTHER): Payer: Medicare HMO

## 2017-10-28 ENCOUNTER — Telehealth: Payer: Self-pay

## 2017-10-28 VITALS — BP 126/80 | HR 82 | Ht 63.0 in | Wt 154.1 lb

## 2017-10-28 DIAGNOSIS — Z0000 Encounter for general adult medical examination without abnormal findings: Secondary | ICD-10-CM

## 2017-10-28 DIAGNOSIS — Z1159 Encounter for screening for other viral diseases: Secondary | ICD-10-CM

## 2017-10-28 NOTE — Patient Instructions (Addendum)
Nancy Herman , Thank you for taking time to come for your Medicare Wellness Visit. I appreciate your ongoing commitment to your health goals. Please review the following plan we discussed and let me know if I can assist you in the future.   Will postpone high dose flu vaccine. You can take it when you see Dr. Martinique in October   Continue to use your mind and know that whatever you do for heart health also helps your brain, Memory issues are evaluated in regard to the following:  Issues making decisions:  Less interest in hobbies / activities:  Repeats questions, stories (family complaining):  Trouble using ordinary gadgets (microwave, computer, phone):  Forgets the month or year:   Mismanaging finances:   Remembering appts:  Daily problems with thinking and/or memory:   It is helpful to do memory games; puzzles; Lumosity or brain teasers of your choice by book; online or other.      A Tetanus is recommended every 10 years. Medicare covers a tetanus if you have a cut or wound; otherwise, there may be a charge. If you had not had a tetanus with pertusses, known as the Tdap, you can take this anytime.  Will discuss taking another PSV 23 pneumonia vaccine as you had this about 15 days prior to turning 67 when you visit Dr .Guadelupe Sabin   A hearing screen is free.  If you need hearing aid [facility] - can assist with hearing aid x 1  No reviews  [employer] Office  [place] [address]  3231545997  http://clienthiadev.devcloud.acquia-[email or web address]/sites/default/files/hearingpedia/Guide_How_to_Buy_Hearing_Aids.pdf   These are the goals we discussed: Goals    . Exercise 150 min/wk Moderate Activity     Will start back walking At least 50 three days a week     . Exercise 150 minutes per week (moderate activity)     Will start walking; walks at the mall at 9:30        This is a list of the screening recommended for you and due dates:  Health  Maintenance  Topic Date Due  .  Hepatitis C: One time screening is recommended by Center for Disease Control  (CDC) for  adults born from [birth year] through 1963.   1945-04-26  . Tetanus Vaccine  08/20/1966  . Pneumonia vaccines (2 of 2 - PPSV23) 08/04/2017  . Flu Shot  09/04/2017  . Mammogram  09/12/2019  . Colon Cancer Screening  04/04/2024  . DEXA scan (bone density measurement)  Completed      Fall Prevention in the Home Falls can cause injuries. They can happen to people of all ages. There are many things you can do to make your home safe and to help prevent falls. What can I do on the outside of my home?  Regularly fix the edges of walkways and driveways and fix any cracks.  Remove anything that might make you trip as you walk through a door, such as a raised step or threshold.  Trim any bushes or trees on the path to your home.  Use bright outdoor lighting.  Clear any walking paths of anything that might make someone trip, such as rocks or tools.  Regularly check to see if handrails are loose or broken. Make sure that both sides of any steps have handrails.  Any raised decks and porches should have guardrails on the edges.  Have any leaves, snow, or ice cleared regularly.  Use sand or salt on walking  paths during winter.  Clean up any spills in your garage right away. This includes oil or grease spills. What can I do in the bathroom?  Use night lights.  Install grab bars by the toilet and in the tub and shower. Do not use towel bars as grab bars.  Use non-skid mats or decals in the tub or shower.  If you need to sit down in the shower, use a plastic, non-slip stool.  Keep the floor dry. Clean up any water that spills on the floor as soon as it happens.  Remove soap buildup in the tub or shower regularly.  Attach bath mats securely with double-sided non-slip rug tape.  Do not have throw rugs and other things on the floor that can make you trip. What can I do in  the bedroom?  Use night lights.  Make sure that you have a light by your bed that is easy to reach.  Do not use any sheets or blankets that are too big for your bed. They should not hang down onto the floor.  Have a firm chair that has side arms. You can use this for support while you get dressed.  Do not have throw rugs and other things on the floor that can make you trip. What can I do in the kitchen?  Clean up any spills right away.  Avoid walking on wet floors.  Keep items that you use a lot in easy-to-reach places.  If you need to reach something above you, use a strong step stool that has a grab bar.  Keep electrical cords out of the way.  Do not use floor polish or wax that makes floors slippery. If you must use wax, use non-skid floor wax.  Do not have throw rugs and other things on the floor that can make you trip. What can I do with my stairs?  Do not leave any items on the stairs.  Make sure that there are handrails on both sides of the stairs and use them. Fix handrails that are broken or loose. Make sure that handrails are as long as the stairways.  Check any carpeting to make sure that it is firmly attached to the stairs. Fix any carpet that is loose or worn.  Avoid having throw rugs at the top or bottom of the stairs. If you do have throw rugs, attach them to the floor with carpet tape.  Make sure that you have a light switch at the top of the stairs and the bottom of the stairs. If you do not have them, ask someone to add them for you. What else can I do to help prevent falls?  Wear shoes that: ? Do not have high heels. ? Have rubber bottoms. ? Are comfortable and fit you well. ? Are closed at the toe. Do not wear sandals.  If you use a stepladder: ? Make sure that it is fully opened. Do not climb a closed stepladder. ? Make sure that both sides of the stepladder are locked into place. ? Ask someone to hold it for you, if possible.  Clearly mark and  make sure that you can see: ? Any grab bars or handrails. ? First and last steps. ? Where the edge of each step is.  Use tools that help you move around (mobility aids) if they are needed. These include: ? Canes. ? Walkers. ? Scooters. ? Crutches.  Turn on the lights when you go into a dark area. Replace any  light bulbs as soon as they burn out.  Set up your furniture so you have a clear path. Avoid moving your furniture around.  If any of your floors are uneven, fix them.  If there are any pets around you, be aware of where they are.  Review your medicines with your doctor. Some medicines can make you feel dizzy. This can increase your chance of falling. Ask your doctor what other things that you can do to help prevent falls. This information is not intended to replace advice given to you by your health care provider. Make sure you discuss any questions you have with your health care provider. Document Released: 11/17/2008 Document Revised: 06/29/2015 Document Reviewed: 02/25/2014 Elsevier Interactive Patient Education  2018 [place] Maintenance, Female Adopting a healthy lifestyle and getting preventive care can go a long way to promote health and wellness. Talk with your health care provider about what schedule of regular examinations is right for you. This is a good chance for you to check in with your provider about disease prevention and staying healthy. In between checkups, there are plenty of things you can do on your own. Experts have done a lot of research about which lifestyle changes and preventive measures are most likely to keep you healthy. Ask your health care provider for more information. Weight and diet Eat a healthy diet  Be sure to include plenty of vegetables, fruits, low-fat dairy products, and lean protein.  Do not eat a lot of foods high in solid fats, added sugars, or salt.  Get regular exercise. This is one of the most important things you  can do for your health. ? Most adults should exercise for at least 150 minutes each week. The exercise should increase your heart rate and make you sweat (moderate-intensity exercise). ? Most adults should also do strengthening exercises at least twice a week. This is in addition to the moderate-intensity exercise.  Maintain a healthy weight  Body mass index (BMI) is a measurement that can be used to identify possible weight problems. It estimates body fat based on height and weight. Your health care provider can help determine your BMI and help you achieve or maintain a healthy weight.  For females 54 years of age and older: ? A BMI below 18.5 is considered underweight. ? A BMI of 18.5 to 24.9 is normal. ? A BMI of 25 to 29.9 is considered overweight. ? A BMI of 30 and above is considered obese.  Watch levels of cholesterol and blood lipids  You should start having your blood tested for lipids and cholesterol at 70 years of age, then have this test every 5 years.  You may need to have your cholesterol levels checked more often if: ? Your lipid or cholesterol levels are high. ? You are older than 70 years of age. ? You are at high risk for heart disease.  Cancer screening Lung Cancer  Lung cancer screening is recommended for adults 8-13 years old who are at high risk for lung cancer because of a history of smoking.  A yearly low-dose CT scan of the lungs is recommended for people who: ? Currently smoke. ? Have quit within the past 15 years. ? Have at least a 30-pack-year history of smoking. A pack year is smoking an average of one pack of cigarettes a day for 1 year.  Yearly screening should continue until it has been 15 years since you quit.  Yearly screening should stop if you  develop a health problem that would prevent you from having lung cancer treatment.  Breast Cancer  Practice breast self-awareness. This means understanding how your breasts normally appear and  feel.  It also means doing regular breast self-exams. Let your health care provider know about any changes, no matter how small.  If you are in your 20s or 30s, you should have a clinical breast exam (CBE) by a health care provider every 1-3 years as part of a regular health exam.  If you are 23 or older, have a CBE every year. Also consider having a breast X-ray (mammogram) every year.  If you have a family history of breast cancer, talk to your health care provider about genetic screening.  If you are at high risk for breast cancer, talk to your health care provider about having an MRI and a mammogram every year.  Breast cancer gene (BRCA) assessment is recommended for women who have family members with BRCA-related cancers. BRCA-related cancers include: ? Breast. ? Ovarian. ? Tubal. ? Peritoneal cancers.  Results of the assessment will determine the need for genetic counseling and BRCA1 and BRCA2 testing.  Cervical Cancer Your health care provider may recommend that you be screened regularly for cancer of the pelvic organs (ovaries, uterus, and vagina). This screening involves a pelvic examination, including checking for microscopic changes to the surface of your cervix (Pap test). You may be encouraged to have this screening done every 3 years, beginning at age 35.  For women ages 77-65, health care providers may recommend pelvic exams and Pap testing every 3 years, or they may recommend the Pap and pelvic exam, combined with testing for human papilloma virus (HPV), every 5 years. Some types of HPV increase your risk of cervical cancer. Testing for HPV may also be done on women of any age with unclear Pap test results.  Other health care providers may not recommend any screening for nonpregnant women who are considered low risk for pelvic cancer and who do not have symptoms. Ask your health care provider if a screening pelvic exam is right for you.  If you have had past treatment for  cervical cancer or a condition that could lead to cancer, you need Pap tests and screening for cancer for at least 20 years after your treatment. If Pap tests have been discontinued, your risk factors (such as having a new sexual partner) need to be reassessed to determine if screening should resume. Some women have medical problems that increase the chance of getting cervical cancer. In these cases, your health care provider may recommend more frequent screening and Pap tests.  Colorectal Cancer  This type of cancer can be detected and often prevented.  Routine colorectal cancer screening usually begins at 70 years of age and continues through 70 years of age.  Your health care provider may recommend screening at an earlier age if you have risk factors for colon cancer.  Your health care provider may also recommend using home test kits to check for hidden blood in the stool.  A small camera at the end of a tube can be used to examine your colon directly (sigmoidoscopy or colonoscopy). This is done to check for the earliest forms of colorectal cancer.  Routine screening usually begins at age 47.  Direct examination of the colon should be repeated every 5-10 years through 70 years of age. However, you may need to be screened more often if early forms of precancerous polyps or small growths are found.  Skin Cancer  Check your skin from head to toe regularly.  Tell your health care provider about any new moles or changes in moles, especially if there is a change in a mole's shape or color.  Also tell your health care provider if you have a mole that is larger than the size of a pencil eraser.  Always use sunscreen. Apply sunscreen liberally and repeatedly throughout the day.  Protect yourself by wearing long sleeves, pants, a wide-brimmed hat, and sunglasses whenever you are outside.  Heart disease, diabetes, and high blood pressure  High blood pressure causes heart disease and increases  the risk of stroke. High blood pressure is more likely to develop in: ? People who have blood pressure in the high end of the normal range (130-139/85-89 mm Hg). ? People who are overweight or obese. ? People who are African American.  If you are 89-63 years of age, have your blood pressure checked every 3-5 years. If you are 38 years of age or older, have your blood pressure checked every year. You should have your blood pressure measured twice-once when you are at a hospital or clinic, and once when you are not at a hospital or clinic. Record the average of the two measurements. To check your blood pressure when you are not at a hospital or clinic, you can use: ? An automated blood pressure machine at a pharmacy. ? A home blood pressure monitor.  If you are between 86 years and 13 years old, ask your health care provider if you should take aspirin to prevent strokes.  Have regular diabetes screenings. This involves taking a blood sample to check your fasting blood sugar level. ? If you are at a normal weight and have a low risk for diabetes, have this test once every three years after 70 years of age. ? If you are overweight and have a high risk for diabetes, consider being tested at a younger age or more often. Preventing infection Hepatitis B  If you have a higher risk for hepatitis B, you should be screened for this virus. You are considered at high risk for hepatitis B if: ? You were born in a country where hepatitis B is common. Ask your health care provider which countries are considered high risk. ? Your parents were born in a high-risk country, and you have not been immunized against hepatitis B (hepatitis B vaccine). ? You have HIV or AIDS. ? You use needles to inject street drugs. ? You live with someone who has hepatitis B. ? You have had sex with someone who has hepatitis B. ? You get hemodialysis treatment. ? You take certain medicines for conditions, including cancer, organ  transplantation, and autoimmune conditions.  Hepatitis C  Blood testing is recommended for: ? Everyone born from [birth year] through 1963. ? Anyone with known risk factors for hepatitis C.  Sexually transmitted infections (STIs)  You should be screened for sexually transmitted infections (STIs) including gonorrhea and chlamydia if: ? You are sexually active and are younger than 69 years of age. ? You are older than 70 years of age and your health care provider tells you that you are at risk for this type of infection. ? Your sexual activity has changed since you were last screened and you are at an increased risk for chlamydia or gonorrhea. Ask your health care provider if you are at risk.  If you do not have HIV, but are at risk, it may be recommended that you  take a prescription medicine daily to prevent HIV infection. This is called pre-exposure prophylaxis (PrEP). You are considered at risk if: ? You are sexually active and do not regularly use condoms or know the HIV status of your partner(s). ? You take drugs by injection. ? You are sexually active with a partner who has HIV.  Talk with your health care provider about whether you are at high risk of being infected with HIV. If you choose to begin PrEP, you should first be tested for HIV. You should then be tested every 3 months for as long as you are taking PrEP. Pregnancy  If you are premenopausal and you may become pregnant, ask your health care provider about preconception counseling.  If you may become pregnant, take 400 to 800 micrograms (mcg) of folic acid every day.  If you want to prevent pregnancy, talk to your health care provider about birth control (contraception). Osteoporosis and menopause  Osteoporosis is a disease in which the bones lose minerals and strength with aging. This can result in serious bone fractures. Your risk for osteoporosis can be identified using a bone density scan.  If you are 17 years of age or  older, or if you are at risk for osteoporosis and fractures, ask your health care provider if you should be screened.  Ask your health care provider whether you should take a calcium or vitamin D supplement to lower your risk for osteoporosis.  Menopause may have certain physical symptoms and risks.  Hormone replacement therapy may reduce some of these symptoms and risks. Talk to your health care provider about whether hormone replacement therapy is right for you. Follow these instructions at home:  Schedule regular health, dental, and eye exams.  Stay current with your immunizations.  Do not use any tobacco products including cigarettes, chewing tobacco, or electronic cigarettes.  If you are pregnant, do not drink alcohol.  If you are breastfeeding, limit how much and how often you drink alcohol.  Limit alcohol intake to no more than 1 drink per day for nonpregnant women. One drink equals 12 ounces of beer, 5 ounces of wine, or 1 ounces of hard liquor.  Do not use street drugs.  Do not share needles.  Ask your health care provider for help if you need support or information about quitting drugs.  Tell your health care provider if you often feel depressed.  Tell your health care provider if you have ever been abused or do not feel safe at home. This information is not intended to replace advice given to you by your health care provider. Make sure you discuss any questions you have with your health care provider. Document Released: 08/06/2010 Document Revised: 06/29/2015 Document Reviewed: 10/25/2014 Elsevier Interactive Patient Education  2018 [employer].   Hearing Loss Hearing loss is a partial or total loss of the ability to hear. This can be temporary or permanent, and it can happen in one or both ears. Hearing loss may be referred to as deafness. Medical care is necessary to treat hearing loss properly and to prevent the condition from getting worse. Your hearing may  partially or completely come back, depending on what caused your hearing loss and how severe it is. In some cases, hearing loss is permanent. What are the causes? Common causes of hearing loss include:  Too much wax in the ear canal.  Infection of the ear canal or middle ear.  Fluid in the middle ear.  Injury to the ear or  surrounding area.  An object stuck in the ear.  Prolonged exposure to loud sounds, such as music.  Less common causes of hearing loss include:  Tumors in the ear.  Viral or bacterial infections, such as meningitis.  A hole in the eardrum (perforated eardrum).  Problems with the hearing nerve that sends signals between the brain and the ear.  Certain medicines.  What are the signs or symptoms? Symptoms of this condition may include:  Difficulty telling the difference between sounds.  Difficulty following a conversation when there is background noise.  Lack of response to sounds in your environment. This may be most noticeable when you do not respond to startling sounds.  Needing to turn up the volume on the television, radio, etc.  Ringing in the ears.  Dizziness.  Pain in the ears.  How is this diagnosed? This condition is diagnosed based on a physical exam and a hearing test (audiometry). The audiometry test will be performed by a hearing specialist (audiologist). You may also be referred to an ear, nose, and throat (ENT) specialist (otolaryngologist). How is this treated? Treatment for recent onset of hearing loss may include:  Ear wax removal.  Being prescribed medicines to prevent infection (antibiotics).  Being prescribed medicines to reduce inflammation (corticosteroids).  Follow these instructions at home:  If you were prescribed an antibiotic medicine, take it as told by your health care provider. Do not stop taking the antibiotic even if you start to feel better.  Take over-the-counter and prescription medicines only as told by  your health care provider.  Avoid loud noises.  Return to your normal activities as told by your health care provider. Ask your health care provider what activities are safe for you.  Keep all follow-up visits as told by your health care provider. This is important. Contact a health care provider if:  You feel dizzy.  You develop new symptoms.  You vomit or feel nauseous.  You have a fever. Get help right away if:  You develop sudden changes in your vision.  You have severe ear pain.  You have new or increased weakness.  You have a severe headache. This information is not intended to replace advice given to you by your health care provider. Make sure you discuss any questions you have with your health care provider. Document Released: 01/21/2005 Document Revised: 06/29/2015 Document Reviewed: 06/08/2014 Elsevier Interactive Patient Education  2018 [employer].

## 2017-10-28 NOTE — Telephone Encounter (Signed)
In for AWV. Stated she could not get her linzess and gave her patient assistance form. Will try to complete and bring back to the office. Will fup prior to her apt with Dr. Martinique on 10/20  Wynetta Fines RN

## 2017-10-30 ENCOUNTER — Telehealth: Payer: Self-pay

## 2017-10-30 NOTE — Telephone Encounter (Signed)
Error

## 2017-11-05 NOTE — Progress Notes (Signed)
I have reviewed documentation from this visit and I agree with recommendations given.  Hadriel Northup G. Manasvi Dickard, MD  [facility]. [place] office.   

## 2017-11-07 ENCOUNTER — Ambulatory Visit: Payer: Medicare HMO

## 2017-11-27 ENCOUNTER — Telehealth: Payer: Self-pay

## 2017-11-27 NOTE — Telephone Encounter (Signed)
Call to Ms Oplinger to fup on Linzess form for assistance. Left VM to call for questions [phone] or bring her form when she comes in to see Dr. Martinique Phuc Kluttz RN

## 2017-12-03 ENCOUNTER — Encounter: Payer: Self-pay | Admitting: Family Medicine

## 2017-12-03 ENCOUNTER — Ambulatory Visit (INDEPENDENT_AMBULATORY_CARE_PROVIDER_SITE_OTHER): Payer: Medicare HMO | Admitting: Family Medicine

## 2017-12-03 VITALS — BP 110/70 | HR 88 | Temp 98.6°F | Resp 12 | Ht 63.0 in | Wt 155.4 lb

## 2017-12-03 DIAGNOSIS — R202 Paresthesia of skin: Secondary | ICD-10-CM | POA: Diagnosis not present

## 2017-12-03 DIAGNOSIS — F411 Generalized anxiety disorder: Secondary | ICD-10-CM

## 2017-12-03 DIAGNOSIS — R739 Hyperglycemia, unspecified: Secondary | ICD-10-CM | POA: Diagnosis not present

## 2017-12-03 DIAGNOSIS — K581 Irritable bowel syndrome with constipation: Secondary | ICD-10-CM | POA: Diagnosis not present

## 2017-12-03 DIAGNOSIS — E785 Hyperlipidemia, unspecified: Secondary | ICD-10-CM

## 2017-12-03 DIAGNOSIS — Z23 Encounter for immunization: Secondary | ICD-10-CM | POA: Diagnosis not present

## 2017-12-03 DIAGNOSIS — F32A Depression, unspecified: Secondary | ICD-10-CM

## 2017-12-03 DIAGNOSIS — F329 Major depressive disorder, single episode, unspecified: Secondary | ICD-10-CM

## 2017-12-03 DIAGNOSIS — R0602 Shortness of breath: Secondary | ICD-10-CM

## 2017-12-03 MED ORDER — CITALOPRAM HYDROBROMIDE 20 MG PO TABS: 20.0000 mg | ORAL_TABLET | Freq: Every day | ORAL | 3 refills | Status: DC

## 2017-12-03 MED ORDER — FLUOXETINE HCL 10 MG PO TABS: ORAL_TABLET | ORAL | 0 refills | Status: DC

## 2017-12-03 NOTE — Progress Notes (Signed)
HPI:   Nancy Herman is a 72 y.o. female, who is here today for 3 months follow up.   She was last seen on 09/02/2017.  Anxiety and depression: She has been on Prozac for years, last visit we decided to start weaning off medication. Currently she is on Prozac 20 and 10 mg alternating daily.  No motivation. She denies suicidal thoughts.  Anxiety is exacerbated by some stress at home as well as some of her chronic medical problems.   IBS: Constipation. Last visit Amitiza was recommended but medication was as expensive as Linzess,so she did not get it. Bowel movements daily. Exacerbated with "big meals." Alleviated sometimes by increasing fiber intake. She does not feel like she is completely empty. Occasionally blood on tissue after defecation, usually associated with straining. She denies abdominal pain, nausea, vomiting, or abnormal weight loss.  She also follows with gastroenterologist.  + Urinary frequency, this is a chronic problem.  Negative for dysuria, gross hematuria, or urine incontinence.  Last visit she was also complaining about groin pain. She has history of OA. Pain is stable.  Abdominal CT was obtained on 2/0/2542: Small umbilical and LEFT inguinal hernias containing fat. No acute intra-abdominal or intrapelvic abnormalities identified. Specifically no definite RIGHT inguinal hernia seen.   Hyperlipidemia:  Currently on pravastatin 20 mg daily. Following a low fat diet: Yes.  She has not noted side effects with medication.  Lab Results  Component Value Date   CHOL 233 (H) 09/30/2016   HDL 89.50 09/30/2016   LDLCALC 136 (H) 09/30/2016   TRIG 36.0 09/30/2016   CHOLHDL 3 09/30/2016   She already ate breakfast today.  History of GERD, currently she is on omeprazole 40 mg daily. Intermittent burping and heartburn. Problem has improved with dietary changes.   She also mentions occasional SOB and "some tightness" when she is  walking,usually when she is "rushing." but not running."  She has had similar symptoms intermittently for a while. Lexiscan stress test done in 2017 was negative.  No associated diaphoresis, left-sided chest pain, or palpitations.   Review of Systems  Constitutional: Positive for fatigue. Negative for activity change, appetite change, fever and unexpected weight change.  HENT: Negative for mouth sores, nosebleeds and trouble swallowing.   Eyes: Negative for redness and visual disturbance.  Respiratory: Negative for cough, shortness of breath and wheezing.   Cardiovascular: Negative for chest pain, palpitations and leg swelling.  Gastrointestinal: Positive for constipation. Negative for abdominal pain, nausea and vomiting.       Negative for changes in bowel habits.  Endocrine: Negative for cold intolerance and heat intolerance.  Genitourinary: Negative for decreased urine volume, dysuria and hematuria.  Musculoskeletal: Positive for arthralgias, back pain and neck pain.  Allergic/Immunologic: Positive for environmental allergies.  Neurological: Negative for syncope, weakness and headaches.  Psychiatric/Behavioral: Negative for confusion and suicidal ideas. The patient is nervous/anxious.      Current Outpatient Medications on File Prior to Visit  Medication Sig Dispense Refill  . albuterol (PROVENTIL HFA;VENTOLIN HFA) 108 (90 Base) MCG/ACT inhaler Inhale 2 puffs into the lungs every 6 (six) hours as needed for wheezing or shortness of breath. 1 Inhaler 2  . Calcium Carbonate-Vit D-Min (CALCIUM 1200 PO) Take 1 tablet by mouth daily.    . cetirizine (ZYRTEC) 10 MG tablet Take 10 mg by mouth daily.    . cholecalciferol (VITAMIN D) 1000 units tablet Chew 1 tablet by mouth daily.    . famotidine (PEPCID)  20 MG tablet Take 20 mg by mouth daily. 1 tab twice daily    . ipratropium (ATROVENT) 0.03 % nasal spray 2 sprays in each nostril twice a day. 30 mL 3  . levothyroxine (SYNTHROID,  LEVOTHROID) 50 MCG tablet TAKE 1 TABLET BY MOUTH ONCE DAILY BEFORE BREAKFAST 90 tablet 2  . montelukast (SINGULAIR) 10 MG tablet TAKE 1 TABLET BY MOUTH AT BEDTIME 90 tablet 0  . omeprazole (PRILOSEC) 40 MG capsule Take 1 capsule (40 mg total) by mouth daily before breakfast. 90 capsule 1  . pravastatin (PRAVACHOL) 20 MG tablet TAKE 1 TABLET BY MOUTH ONCE DAILY 90 tablet 1  . Probiotic Product (PROBIOTIC-10 PO) Take by mouth.    . Spacer/Aero-Holding Chambers (AEROCHAMBER PLUS) inhaler Use as instructed to use with inahaler. 1 each 1  . triazolam (HALCION) 0.125 MG tablet Take 1/2 hr before MRI 1 tablet 0  . LINZESS 145 MCG CAPS capsule      No current facility-administered medications on file prior to visit.      Past Medical History:  Diagnosis Date  . Allergy   . Anxiety   . Dyslipidemia   . Frequent headaches   . GERD (gastroesophageal reflux disease)   . Hyperlipidemia   . IBS (irritable bowel syndrome)   . 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 Thyroid disease   . Urine incontinence    Allergies  Allergen Reactions  . Other     Environment     Social History   Socioeconomic History  . Marital status: Single    Spouse name: Not on file  . Number of children: Not on file  . Years of education: Not on file  . Highest education level: Not on file  Occupational History  . Not on file  Social Needs  . Financial resource strain: Not on file  . Food insecurity:    Worry: Not on file    Inability: Not on file  . Transportation needs:    Medical: Not on file    Non-medical: Not on file  Tobacco Use  . Smoking status: Never Smoker  . Smokeless tobacco: Never Used  Substance and Sexual Activity  . Alcohol use: No  . Drug use: No  . Sexual activity: Never  Lifestyle  . Physical activity:    Days per week: Not on file    Minutes per session: Not on file  . Stress: Not on file  Relationships  . Social connections:    Talks on phone: Not on file    Gets together: Not  on file    Attends religious service: Not on file    Active member of club or organization: Not on file    Attends meetings of clubs or organizations: Not on file    Relationship status: Not on file  Other Topics Concern  . Not on file  Social History Narrative  . Not on file    Vitals:   12/03/17 0949  BP: 110/70  Pulse: 88  Temp: 98.6 F (37 C)  SpO2: 94%   Body mass index is 27.53 kg/m.   Physical Exam  Nursing note and vitals reviewed. Constitutional: She is oriented to person, place, and time. She appears well-developed. No distress.  HENT:  Head: Normocephalic and atraumatic.  Mouth/Throat: Oropharynx is clear and moist and mucous membranes are normal.  Eyes: Pupils are equal, round, and reactive to light. Conjunctivae are normal.  Cardiovascular: Normal rate and regular rhythm.  No murmur heard. Pulses:  Dorsalis pedis pulses are 2+ on the right side, and 2+ on the left side.  Respiratory: Effort normal and breath sounds normal. No respiratory distress.  GI: Soft. She exhibits no mass. There is no hepatomegaly. There is no tenderness.  Musculoskeletal: She exhibits no edema.  Lymphadenopathy:    She has no cervical adenopathy.  Neurological: She is alert and oriented to person, place, and time. She has normal strength. No cranial nerve deficit. Gait normal.  Feet monofilament exam.  Skin: Skin is warm. No rash noted. No erythema.  Psychiatric: Her mood appears anxious.  Well groomed, good eye contact.     ASSESSMENT AND PLAN:   Nancy Herman was seen today for 3 months follow-up.  Orders Placed This Encounter  Procedures  . Flu vaccine HIGH DOSE PF (Fluzone High dose)    IBS (irritable bowel syndrome) Problem still not well controlled. Form for medication assistance for Linzess was completed today. Continue adequate hydration and fiber intake. Follow-up in 3 months  Anxiety disorder Still not well controlled. After discussion of all  other pharmacologic options, she agrees with trying Celexa. We will continue weaning off Prozac. She will start Celexa 10 mg and increase to 20 mg a week later. Instructed about warning signs. Follow-up in 3 months, before if needed.  Depressive disorder Today Celexa was started. We will continue weaning off Prozac. Side effects of medications discussed. Recommend considering psychotherapy.  Hyperlipidemia No changes in pravastatin 20 mg. Continue low-fat diet. She will come back next week for fasting labs, further recommendations will be given according to results.   Shortness of breath We discussed possible etiologies. Currently she is asymptomatic. She has a negative stress test in 2017: SPECT images revealed overall good perfusion present to the myocardium.  No evidence of stress-induced ischemia or infarction present.. ?  Anxiety or GERD.  States that she doe snot think is here "heart", Hx does not suggest cardiac etiology but likelihood is never zero. We will hold on further work-up today , she was instructed about warning signs. She voices understanding and agrees with plan.      Grabiel Schmutz G. [place], MD  [facility]. [place] office.

## 2017-12-03 NOTE — Patient Instructions (Addendum)
A few things to remember from today's visit:   Hyperglycemia  Generalized anxiety disorder - Plan: citalopram (CELEXA) 20 MG tablet, FLUoxetine (PROZAC) 10 MG tablet  Irritable bowel syndrome with constipation  Tingling sensation  Hopefully you can get lenses with form we completed today. Celexa started today, start with half tablet for a week and then increase it to the whole tablet. We will continue weaning off Prozac.   Please be sure medication list is accurate. If a new problem present, please set up appointment sooner than planned today.

## 2017-12-03 NOTE — Assessment & Plan Note (Signed)
Problem still not well controlled. Form for medication assistance for Linzess was completed today. Continue adequate hydration and fiber intake. Follow-up in 3 months

## 2017-12-03 NOTE — Assessment & Plan Note (Signed)
Today Celexa was started. We will continue weaning off Prozac. Side effects of medications discussed. Recommend considering psychotherapy.

## 2017-12-03 NOTE — Assessment & Plan Note (Signed)
Still not well controlled. After discussion of all other pharmacologic options, she agrees with trying Celexa. We will continue weaning off Prozac. She will start Celexa 10 mg and increase to 20 mg a week later. Instructed about warning signs. Follow-up in 3 months, before if needed.

## 2017-12-03 NOTE — Assessment & Plan Note (Signed)
No changes in pravastatin 20 mg. Continue low-fat diet. She will come back next week for fasting labs, further recommendations will be given according to results.

## 2017-12-07 ENCOUNTER — Encounter: Payer: Self-pay | Admitting: Family Medicine

## 2017-12-08 ENCOUNTER — Other Ambulatory Visit: Payer: Self-pay | Admitting: Family Medicine

## 2017-12-08 ENCOUNTER — Other Ambulatory Visit (INDEPENDENT_AMBULATORY_CARE_PROVIDER_SITE_OTHER): Payer: Medicare HMO

## 2017-12-08 DIAGNOSIS — R202 Paresthesia of skin: Secondary | ICD-10-CM | POA: Diagnosis not present

## 2017-12-08 DIAGNOSIS — R739 Hyperglycemia, unspecified: Secondary | ICD-10-CM

## 2017-12-08 DIAGNOSIS — J309 Allergic rhinitis, unspecified: Secondary | ICD-10-CM

## 2017-12-08 DIAGNOSIS — Z1159 Encounter for screening for other viral diseases: Secondary | ICD-10-CM

## 2017-12-08 DIAGNOSIS — E785 Hyperlipidemia, unspecified: Secondary | ICD-10-CM | POA: Diagnosis not present

## 2017-12-08 LAB — LIPID PANEL
Cholesterol: 216 mg/dL — ABNORMAL HIGH (ref 0–200)
HDL: 80.8 mg/dL (ref >39.00)
LDL Cholesterol: 122 mg/dL — ABNORMAL HIGH (ref 0–99)
NonHDL: 135.45
Total CHOL/HDL Ratio: 3
Triglycerides: 68 mg/dL (ref 0.0–149.0)
VLDL: 13.6 mg/dL (ref 0.0–40.0)

## 2017-12-08 LAB — COMPREHENSIVE METABOLIC PANEL
ALT: 13 U/L (ref 0–35)
AST: 18 U/L (ref 0–37)
Albumin: 4 g/dL (ref 3.5–5.2)
Alkaline Phosphatase: 70 U/L (ref 39–117)
BUN: 11 mg/dL (ref 6–23)
CO2: 28 mEq/L (ref 19–32)
Calcium: 9.4 mg/dL (ref 8.4–10.5)
Chloride: 105 mEq/L (ref 96–112)
Creatinine, Ser: 1.05 mg/dL (ref 0.40–1.20)
GFR: 66.58 mL/min (ref >60.00)
Glucose, Bld: 81 mg/dL (ref 70–99)
Potassium: 4.6 mEq/L (ref 3.5–5.1)
Sodium: 143 mEq/L (ref 135–145)
Total Bilirubin: 0.5 mg/dL (ref 0.2–1.2)
Total Protein: 6.8 g/dL (ref 6.0–8.3)

## 2017-12-08 LAB — HEMOGLOBIN A1C: Hgb A1c MFr Bld: 5.8 % (ref 4.6–6.5)

## 2017-12-08 LAB — VITAMIN B12: Vitamin B-12: 442 pg/mL (ref 211–911)

## 2017-12-10 ENCOUNTER — Encounter: Payer: Self-pay | Admitting: Family Medicine

## 2017-12-10 LAB — HEPATITIS C ANTIBODY
Hepatitis C Ab: NONREACTIVE
SIGNAL TO CUT-OFF: 0.02 (ref <1.00)

## 2018-01-06 ENCOUNTER — Telehealth: Payer: Self-pay

## 2018-01-06 NOTE — Telephone Encounter (Signed)
Copied from [place] [phone]. Topic: General - Other >> Jan 06, 2018  9:50 AM Bea Graff, NT wrote: Reason for CRM: Pt states that when she had her AWV she was given a paper to complete about some medicines and she would like to speak with the health coach regarding questions she has about the form. Please advise.

## 2018-01-06 NOTE — Telephone Encounter (Signed)
Patient has received her AWV, but has some questions. SF

## 2018-01-07 NOTE — Telephone Encounter (Signed)
The patient was given a  Rx assistance form for Linzess as this is the only med that helped her and she is having issues getting her diarrhea under control  Left a message on both numbers to complete her side of the form, Then she needs to bring it in to Dr. Martinique to have her write the rx and sign and then we can fax to the [place] RN

## 2018-01-07 NOTE — Telephone Encounter (Signed)
If she is having diarrhea she needs to discontinue Linzess. Thanks, BJ

## 2018-01-07 NOTE — Telephone Encounter (Signed)
FYI sent to Dr. Jordan for review. 

## 2018-01-09 NOTE — Telephone Encounter (Signed)
Spoke with patient and she stated that she doesn't have Linzess because the Rx costs too much. She stated that she does not have diarrhea, she has some constipation. Patient stated that she has some paperwork to fill out and bring to the office so that she can get some medications at a discounted price.

## 2018-01-12 NOTE — Telephone Encounter (Signed)
Noted. Nancy Hoban [place], MD

## 2018-01-22 ENCOUNTER — Other Ambulatory Visit: Payer: Self-pay | Admitting: Family Medicine

## 2018-01-22 NOTE — Telephone Encounter (Signed)
Requested medication (s) are due for refill today: yes  Requested medication (s) are on the active medication list: yes  Last refill:  03/26/17  Future visit scheduled: no  Notes to clinic:  Historical med   Requested Prescriptions  Pending Prescriptions Disp Refills   LINZESS 145 MCG CAPS capsule 30 capsule      Gastroenterology: Irritable Bowel Syndrome Passed - 01/22/2018 10:28 AM      Passed - Valid encounter within last 12 months    Recent Outpatient Visits          1 month ago Hyperglycemia   [facility] at [facility] [place], Malka So, MD   4 months ago Allergic rhinitis, unspecified seasonality, unspecified trigger   [place] [facility] [place], Malka So, MD   7 months ago Acute non-recurrent frontal sinusitis   Therapist, [facility] at [place], MD   7 months ago [place] at [facility] [place], Malka So, MD   10 months ago Right arm pain   [facility]rap[facility] at [facility] [place], Malka So, MD      Future Appointments            In 9 months Wynetta Fines, [place] at [place], [facility]

## 2018-01-22 NOTE — Telephone Encounter (Signed)
Copied from [place] [phone]. Topic: Quick Communication - Rx Refill/Question >> Jan 22, 2018 10:07 AM Alfredia Ferguson R wrote: Medication: LINZESS 145 MCG CAPS capsule  Has the patient contacted their pharmacy? Yes  Preferred Pharmacy (with phone number or street name): [facility], [place] [phone] (Phone) [phone] (Fax)   Agent: Please be advised that RX refills may take up to 3 business days. We ask that you follow-up with your pharmacy.

## 2018-01-23 MED ORDER — LINZESS 145 MCG PO CAPS: 145.0000 ug | ORAL_CAPSULE | Freq: Every day | ORAL | 2 refills | Status: DC

## 2018-01-23 NOTE — Telephone Encounter (Signed)
Pt calling to f/up on this request.  °

## 2018-01-27 ENCOUNTER — Telehealth: Payer: Self-pay

## 2018-01-27 NOTE — Telephone Encounter (Signed)
Copied from [place] [phone]. Topic: General - Other >> Jan 27, 2018 12:19 PM Windy Kalata wrote: Reason for CRM: Patient thought that she was getting 290mg  but it was filled for 145mg , LINZESS 145 MCG CAPS capsule please advise

## 2018-01-27 NOTE — Telephone Encounter (Signed)
Message sent to Dr. Jordan for review and approval. 

## 2018-01-30 NOTE — Telephone Encounter (Signed)
Start taking Linzess 145 mc daily as needed. We will adjust dose if needed.  Thanks, BJ

## 2018-02-02 ENCOUNTER — Ambulatory Visit: Payer: Self-pay

## 2018-02-02 NOTE — Telephone Encounter (Signed)
Patient scheduled for Thursday with another provider.

## 2018-02-02 NOTE — Telephone Encounter (Signed)
Pt.'s son reports that over the weekend, pt. Was saying that her housed was bugged and people were listening to her. Is not currently confused. Pt. Lives alone and son has brought her to his house so she won't be alone. Denies any alcohol or drug use. Appointment made for Thursday. No availability with her provider.Instructed son if symptoms come back, to go to ED. Verbalizes understanding.  Reason for Disposition . [1] Longstanding confusion (e.g., dementia, stroke) AND [2] NO worsening or change  Answer Assessment - Initial Assessment Questions 1. LEVEL OF CONSCIOUSNESS: "How is he (she, the patient) acting right now?" (e.g., alert-oriented, confused, lethargic, stuporous, comatose)     Alert and oriented now 2. ONSET: "When did the confusion start?"  (minutes, hours, days)     This episode started over the weekend. 3. PATTERN "Does this come and go, or has it been constant since it started?"  "Is it present now?"     No hallucinations. 4. ALCOHOL or DRUGS: "Has he been drinking alcohol or taking any drugs?"      No 5. NARCOTIC MEDICATIONS: "Has he been receiving any narcotic medications?" (e.g., morphine, Vicodin)     No 6. CAUSE: "What do you think is causing the confusion?"      Unsure 7. OTHER SYMPTOMS: "Are there any other symptoms?" (e.g., difficulty breathing, headache, fever, weakness)     No  Protocols used: [place]

## 2018-02-02 NOTE — Telephone Encounter (Signed)
Message sent to Dr. Jordan for review. 

## 2018-02-02 NOTE — Telephone Encounter (Signed)
Left detailed message with medication instructions per Dr. Martinique. Patient advised to call if she had any questions.

## 2018-02-02 NOTE — Telephone Encounter (Signed)
If she is currently having active hallucinations or MS changes, she needs to be seen in the ER now. If she is not having symptoms now and otherwise stable we can try to see her today. Can she come around 3 pm today? Explain that she may need to wait.  Thanks, BJ

## 2018-02-05 ENCOUNTER — Ambulatory Visit (INDEPENDENT_AMBULATORY_CARE_PROVIDER_SITE_OTHER): Payer: Medicare HMO | Admitting: Family Medicine

## 2018-02-05 ENCOUNTER — Encounter: Payer: Self-pay | Admitting: Family Medicine

## 2018-02-05 ENCOUNTER — Ambulatory Visit: Payer: Medicare HMO | Admitting: Family Medicine

## 2018-02-05 VITALS — BP 124/82 | HR 78 | Temp 98.3°F | Ht 63.0 in | Wt 151.0 lb

## 2018-02-05 DIAGNOSIS — R410 Disorientation, unspecified: Secondary | ICD-10-CM | POA: Diagnosis not present

## 2018-02-05 DIAGNOSIS — R300 Dysuria: Secondary | ICD-10-CM

## 2018-02-05 DIAGNOSIS — E039 Hypothyroidism, unspecified: Secondary | ICD-10-CM | POA: Diagnosis not present

## 2018-02-05 DIAGNOSIS — Z79899 Other long term (current) drug therapy: Secondary | ICD-10-CM | POA: Diagnosis not present

## 2018-02-05 DIAGNOSIS — F411 Generalized anxiety disorder: Secondary | ICD-10-CM

## 2018-02-05 LAB — CBC
HCT: 34 % — ABNORMAL LOW (ref 36.0–46.0)
Hemoglobin: 11.4 g/dL — ABNORMAL LOW (ref 12.0–15.0)
MCHC: 33.7 g/dL (ref 30.0–36.0)
MCV: 86.5 fl (ref 78.0–100.0)
Platelets: 226 10*3/uL (ref 150.0–400.0)
RBC: 3.93 Mil/uL (ref 3.87–5.11)
RDW: 14.8 % (ref 11.5–15.5)
WBC: 6.5 10*3/uL (ref 4.0–10.5)

## 2018-02-05 LAB — POC URINALSYSI DIPSTICK (AUTOMATED)
Bilirubin, UA: NEGATIVE
Blood, UA: NEGATIVE
Glucose, UA: NEGATIVE
Ketones, UA: NEGATIVE
Leukocytes, UA: NEGATIVE
Nitrite, UA: NEGATIVE
Protein, UA: NEGATIVE
Spec Grav, UA: 1.02 (ref 1.010–1.025)
Urobilinogen, UA: 0.2 E.U./dL
pH, UA: 6 (ref 5.0–8.0)

## 2018-02-05 LAB — BASIC METABOLIC PANEL
BUN: 17 mg/dL (ref 6–23)
CO2: 27 mEq/L (ref 19–32)
Calcium: 9.4 mg/dL (ref 8.4–10.5)
Chloride: 107 mEq/L (ref 96–112)
Creatinine, Ser: 1.01 mg/dL (ref 0.40–1.20)
GFR: 69.59 mL/min (ref >60.00)
Glucose, Bld: 84 mg/dL (ref 70–99)
Potassium: 3.6 mEq/L (ref 3.5–5.1)
Sodium: 144 mEq/L (ref 135–145)

## 2018-02-05 LAB — TSH: TSH: 1.87 u[IU]/mL (ref 0.35–4.50)

## 2018-02-05 NOTE — Patient Instructions (Signed)
BEFORE YOU LEAVE: -labs -follow up: with PCP in 3-4 days  Continue off of the Celexa for now  Close observation  See PCP as planned to follow up on labs/medications  I hope you are feeling better soon! Seek care promptly in the interim if your symptoms worse or new concerns arise. Go to the emergency department if any severe symptoms, feel of harm or worsening.

## 2018-02-05 NOTE — Progress Notes (Signed)
HPI:  Using dictation device. Unfortunately this device frequently misinterprets words/phrases.  Acute visit for confusion: -son reports started 1 month ago after changing from prozac to celexa - he feels the medication is causing this -reports increased anxiety - particularly about someone breaking in or bugging her house -no fevers, headache, speech changes, vision changes, weakness, numbness, malaise, sob, bowel changes, thoughts of harm to self or others, depression -reports they stopped the celexa 1 week ago and maybe a little better -has had some dysuria and they want to check for UTI -PCP recommend she follow up with her, but they scheduled for today instead -hx sig anxiety, IBS, thyroid disease, allergies   ROS: See pertinent positives and negatives per HPI.  Past Medical History:  Diagnosis Date  . Allergy   . Anxiety   . Dyslipidemia   . Frequent headaches   . GERD (gastroesophageal reflux disease)   . Hyperlipidemia   . IBS (irritable bowel syndrome)   . 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 Thyroid disease   . Urine incontinence     Past Surgical History:  Procedure Laterality Date  . ABDOMINAL HYSTERECTOMY  1990  . BRAIN SURGERY    . BREAST EXCISIONAL BIOPSY Right unsure  . BREAST EXCISIONAL BIOPSY Left unsure  . OVARIAN CYST SURGERY  1994  . REPAIR RECTOCELE  2016   and prolapsed uterus.  . TONSILLECTOMY  1973    Family History  Problem Relation Age of Onset  . Heart disease Mother   . Alcohol abuse Mother   . Drug abuse Mother   . Heart disease Father   . Drug abuse Father   . Alcohol abuse Father     SOCIAL HX: see hpi   Current Outpatient Medications:  .  albuterol (PROVENTIL HFA;VENTOLIN HFA) 108 (90 Base) MCG/ACT inhaler, Inhale 2 puffs into the lungs every 6 (six) hours as needed for wheezing or shortness of breath., Disp: 1 Inhaler, Rfl: 2 .  Calcium Carbonate-Vit D-Min (CALCIUM 1200 PO), Take 1 tablet by mouth daily., Disp: , Rfl:  .   cetirizine (ZYRTEC) 10 MG tablet, Take 10 mg by mouth daily., Disp: , Rfl:  .  cholecalciferol (VITAMIN D) 1000 units tablet, Chew 1 tablet by mouth daily., Disp: , Rfl:  .  citalopram (CELEXA) 20 MG tablet, Take 1 tablet (20 mg total) by mouth daily., Disp: 30 tablet, Rfl: 3 .  famotidine (PEPCID) 20 MG tablet, Take 20 mg by mouth daily. 1 tab twice daily, Disp: , Rfl:  .  FLUoxetine (PROZAC) 10 MG tablet, Take 1 tablet daily for a week, then 1/2 tablet daily for a week, then every other day for a week and discontinue., Disp: 30 tablet, Rfl: 0 .  ipratropium (ATROVENT) 0.03 % nasal spray, 2 sprays in each nostril twice a day., Disp: 30 mL, Rfl: 3 .  levothyroxine (SYNTHROID, LEVOTHROID) 50 MCG tablet, TAKE 1 TABLET BY MOUTH ONCE DAILY BEFORE BREAKFAST, Disp: 90 tablet, Rfl: 2 .  LINZESS 145 MCG CAPS capsule, Take 1 capsule (145 mcg total) by mouth daily before breakfast., Disp: 30 capsule, Rfl: 2 .  montelukast (SINGULAIR) 10 MG tablet, TAKE 1 TABLET BY MOUTH AT BEDTIME, Disp: 90 tablet, Rfl: 0 .  omeprazole (PRILOSEC) 40 MG capsule, TAKE 1 CAPSULE BY MOUTH ONCE DAILY BEFORE BREAKFAST, Disp: 90 capsule, Rfl: 1 .  pravastatin (PRAVACHOL) 20 MG tablet, TAKE 1 TABLET BY MOUTH ONCE DAILY, Disp: 90 tablet, Rfl: 1 .  Probiotic Product (PROBIOTIC-10 PO), Take by mouth.,  Disp: , Rfl:  .  Spacer/Aero-Holding Chambers (AEROCHAMBER PLUS) inhaler, Use as instructed to use with inahaler., Disp: 1 each, Rfl: 1 .  triazolam (HALCION) 0.125 MG tablet, Take 1/2 hr before MRI, Disp: 1 tablet, Rfl: 0  EXAM:  Vitals:   02/05/18 0857 02/05/18 0930  BP: 124/82 124/82  Pulse: 78   Temp: 98.3 F (36.8 C)   SpO2: 95%     Body mass index is 26.75 kg/m.  GENERAL: vitals reviewed and listed above, alert, oriented, appears well hydrated and in no acute distress  HEENT: atraumatic, conjunttiva clear, left lip line lower then r slightly - she and son think this is chronic (reports he husband used to tell her her  face is crooked)no obvious abnormalities on inspection of external nose and ears  NECK: no obvious masses on inspection  LUNGS: clear to auscultation bilaterally, no wheezes, rales or rhonchi, good air movement  CV: HRRR, no peripheral edema  MS: moves all extremities without noticeable abnormality  PSYCH: pleasant and cooperative, CN II-XII grossly intact, finger to nose normal, AOx3, neg mini cog, no obvious depression or anxiety, gait normal, speech and thought processing grossly intact  ASSESSMENT AND PLAN:  Discussed the following assessment and plan:  Dysuria  Generalized anxiety disorder  Confusion  Medication therapy changed  -we discussed possible serious and likely etiologies, workup and treatment, treatment risks and return precautions - pt alert and oriented without any obvious acute neuro deficits on exam - they report facial asymmetry chronic. Advised to look at photos and bring to appt with pcp. -after this discussion, Nancy Herman opted for labs, urine, close PCP follow up to discuss medications, continue off of celexa for now -follow up advised in 3-5 days -of course, we advised Nancy Herman  to return or notify a doctor immediately if symptoms worsen or persist or new concerns arise. Advised ER if any worsening or concerning symptoms in interim.    There are no Patient Instructions on file for this visit.  Nancy Kern, DO

## 2018-02-09 ENCOUNTER — Ambulatory Visit (INDEPENDENT_AMBULATORY_CARE_PROVIDER_SITE_OTHER): Payer: Medicare HMO | Admitting: Family Medicine

## 2018-02-09 ENCOUNTER — Encounter: Payer: Self-pay | Admitting: Family Medicine

## 2018-02-09 VITALS — BP 130/82 | HR 61 | Temp 98.2°F | Resp 12 | Ht 63.0 in | Wt 150.4 lb

## 2018-02-09 DIAGNOSIS — F329 Major depressive disorder, single episode, unspecified: Secondary | ICD-10-CM

## 2018-02-09 DIAGNOSIS — F411 Generalized anxiety disorder: Secondary | ICD-10-CM | POA: Diagnosis not present

## 2018-02-09 DIAGNOSIS — R0789 Other chest pain: Secondary | ICD-10-CM

## 2018-02-09 DIAGNOSIS — K581 Irritable bowel syndrome with constipation: Secondary | ICD-10-CM

## 2018-02-09 DIAGNOSIS — R440 Auditory hallucinations: Secondary | ICD-10-CM

## 2018-02-09 DIAGNOSIS — F32A Depression, unspecified: Secondary | ICD-10-CM

## 2018-02-09 DIAGNOSIS — G4700 Insomnia, unspecified: Secondary | ICD-10-CM

## 2018-02-09 DIAGNOSIS — Z87898 Personal history of other specified conditions: Secondary | ICD-10-CM

## 2018-02-09 DIAGNOSIS — K219 Gastro-esophageal reflux disease without esophagitis: Secondary | ICD-10-CM

## 2018-02-09 MED ORDER — FLUOXETINE HCL 20 MG PO CAPS: 20.0000 mg | ORAL_CAPSULE | Freq: Every day | ORAL | 2 refills | Status: DC

## 2018-02-09 MED ORDER — PANTOPRAZOLE SODIUM 40 MG PO TBEC
40.0000 mg | DELAYED_RELEASE_TABLET | Freq: Every day | ORAL | 3 refills | Status: DC
Start: 2018-02-09 — End: 2018-03-09

## 2018-02-09 NOTE — Patient Instructions (Addendum)
A few things to remember from today's visit:   Depressive disorder - Plan: FLUoxetine (PROZAC) 20 MG capsule  Generalized anxiety disorder - Plan: FLUoxetine (PROZAC) 20 MG capsule  Irritable bowel syndrome with constipation  Insomnia, unspecified type  Gastroesophageal reflux disease, esophagitis presence not specified - Plan: pantoprazole (PROTONIX) 40 MG tablet  Chest discomfort - Plan: EKG 12-Lead  Benefiber over-the-counter twice daily, this is fiber. Start omeprazole. Start Protonix 30 minutes before breakfast. Over-the-counter MiraLAX twice daily, bisacodyl 5 mg daily. Over-the-counter Beano may help with gas.  Arrange appointment with your gastroenterologist. Prozac 20 mg resume.  For now we will hold on treatment for urinary frequency, next appointment we may consider trying something. You can try [place] exercises. This exercise helps with mild urine leakage associated with cough, laughing, or sneezing. It may help with other types of urine incontinence and even with mild fecal incontinence.   Tighten and relax the pelvic muscles intermittently during the day. Once you are familiar with exercise try to hold pelvic muscles contraction for about 8-10 seconds.in the beginning you may not be able to hold contraction for more than a second or 2 but eventually you will be able to hold contraction harder and for longer time. Perform  8-12 exercises 3 times per day and daily for 15-20 weeks. You will need to continue exercises indefinitely to have a lasting effect.    Please be sure medication list is accurate. If a new problem present, please set up appointment sooner than planned today.

## 2018-02-09 NOTE — Assessment & Plan Note (Signed)
We discussed diagnostic criteria, prognosis, and treatment options. She will continue Lasix and 45 mg. MiraLAX twice daily and bisacodyl 5 mg daily as needed. Adequate fiber intake, Benefiber twice daily may help. Recommend increasing fluid intake. Instructed about warning signs. Instructed to arrange appointment with GI. Follow-up in 4 weeks, before if needed.

## 2018-02-09 NOTE — Progress Notes (Signed)
ACUTE VISIT   HPI:  Chief Complaint  Patient presents with  . Follow-up    hallucinations, hearing voices, started 3 or 4 weeks ago    [name] is a 73 y.o. female, who is here today with her son, who is concerned about Ms Nancy Herman "hallucinations." Her son attributes problem to Celexa, which was started in 11/2017.  Hx of anxiety and depression,she has been on Prozac 20 mg for many years,she did not feel like medication was helping,so we decided to try Celexa. She has already wean off Prozac. Discontinue Celexa 2-3 weeks ago.  Hearing voices in her house, she lives alone. People talking about family members, saying that "that lady is going to bit"her up, telephones "are bad,evil."  She is not sleeping well. Denies visual hallucinations. + Crying spells,denies suicidal thoughts.   People knocking at her door. One time she called police because she thought somebody was at her door.  She feels like digestive issues are exacerbating anxiety. + Gas, constipation, and abdominal bloating sensation. Linzess 290 mcg helped but increased gas/bloating sensation. Linzess 145 mg helps little,still has small and hard stools. Her insurance does not cover medication.  Symptoms alleviated by defecation.' +Craving salt. Heartburn and acid reflux.  She is on Omeprazole 40 mg daily.  She follows with GI.  She does not like to use public bathrooms,she feels like she takes "the odor with her." States that even though she cannot smell it she knows people can smell it.  She also mentions chest discomfort, which has been intermittent for a while. She has not identified exacerbating or alleviating factors. She does not think it is cardia. She has had similar concerns in the past.  04/2015 lexiscan stress test negative for ischemia.  Pain is burning like, not radiated and no associated dyspnea,palpitation,or diaphoresis.  It happens at rest, usually when she is in  bed. Alleviated by burping. She has not identified exacerbating factors.   Lab Results  Component Value Date   CREATININE 1.01 02/05/2018   BUN 17 02/05/2018   NA 144 02/05/2018   K 3.6 02/05/2018   CL 107 02/05/2018   CO2 27 02/05/2018   Urinary frequency. She denies dysuria or gross hematuria. This problem is chronic. Negative for urine incontinence.   Review of Systems  Constitutional: Positive for fatigue. Negative for activity change, appetite change and fever.  HENT: Negative for mouth sores, nosebleeds and trouble swallowing.   Eyes: Negative for redness and visual disturbance.  Respiratory: Negative for cough, shortness of breath and wheezing.   Cardiovascular: Negative for palpitations and leg swelling.  Gastrointestinal: Positive for abdominal distention and constipation. Negative for abdominal pain, blood in stool, nausea and vomiting.       Negative for changes in bowel habits.  Genitourinary: Positive for frequency. Negative for decreased urine volume, dysuria and hematuria.  Neurological: Negative for syncope, weakness and headaches.  Psychiatric/Behavioral: Positive for confusion and sleep disturbance. Negative for decreased concentration. The patient is nervous/anxious.       Current Outpatient Medications on File Prior to Visit  Medication Sig Dispense Refill  . albuterol (PROVENTIL HFA;VENTOLIN HFA) 108 (90 Base) MCG/ACT inhaler Inhale 2 puffs into the lungs every 6 (six) hours as needed for wheezing or shortness of breath. 1 Inhaler 2  . Calcium Carbonate-Vit D-Min (CALCIUM 1200 PO) Take 1 tablet by mouth daily.    . cetirizine (ZYRTEC) 10 MG tablet Take 10 mg by mouth daily.    Marland Kitchen  cholecalciferol (VITAMIN D) 1000 units tablet Chew 1 tablet by mouth daily.    . famotidine (PEPCID) 20 MG tablet Take 20 mg by mouth daily. 1 tab twice daily    . ipratropium (ATROVENT) 0.03 % nasal spray 2 sprays in each nostril twice a day. 30 mL 3  . levothyroxine  (SYNTHROID, LEVOTHROID) 50 MCG tablet TAKE 1 TABLET BY MOUTH ONCE DAILY BEFORE BREAKFAST 90 tablet 2  . LINZESS 145 MCG CAPS capsule Take 1 capsule (145 mcg total) by mouth daily before breakfast. 30 capsule 2  . montelukast (SINGULAIR) 10 MG tablet TAKE 1 TABLET BY MOUTH AT BEDTIME 90 tablet 0  . pravastatin (PRAVACHOL) 20 MG tablet TAKE 1 TABLET BY MOUTH ONCE DAILY 90 tablet 1  . Probiotic Product (PROBIOTIC-10 PO) Take by mouth.    . Spacer/Aero-Holding Chambers (AEROCHAMBER PLUS) inhaler Use as instructed to use with inahaler. 1 each 1  . triazolam (HALCION) 0.125 MG tablet Take 1/2 hr before MRI 1 tablet 0   No current facility-administered medications on file prior to visit.      Past Medical History:  Diagnosis Date  . Allergy   . Anxiety   . Dyslipidemia   . Frequent headaches   . GERD (gastroesophageal reflux disease)   . Hyperlipidemia   . IBS (irritable bowel syndrome)   . 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 Thyroid disease   . Urine incontinence    Allergies  Allergen Reactions  . Other     Environment     Social History   Socioeconomic History  . Marital status: Single    Spouse name: Not on file  . Number of children: Not on file  . Years of education: Not on file  . Highest education level: Not on file  Occupational History  . Not on file  Social Needs  . Financial resource strain: Not on file  . Food insecurity:    Worry: Not on file    Inability: Not on file  . Transportation needs:    Medical: Not on file    Non-medical: Not on file  Tobacco Use  . Smoking status: Never Smoker  . Smokeless tobacco: Never Used  Substance and Sexual Activity  . Alcohol use: No  . Drug use: No  . Sexual activity: Never  Lifestyle  . Physical activity:    Days per week: Not on file    Minutes per session: Not on file  . Stress: Not on file  Relationships  . Social connections:    Talks on phone: Not on file    Gets together: Not on file    Attends religious  service: Not on file    Active member of club or organization: Not on file    Attends meetings of clubs or organizations: Not on file    Relationship status: Not on file  Other Topics Concern  . Not on file  Social History Narrative  . Not on file    Vitals:   02/09/18 1050  BP: 130/82  Pulse: 61  Resp: 12  Temp: 98.2 F (36.8 C)  SpO2: 98%   Body mass index is 26.64 kg/m.  Wt Readings from Last 3 Encounters:  02/09/18 150 lb 6 oz (68.2 kg)  02/05/18 151 lb (68.5 kg)  12/03/17 155 lb 6.4 oz (70.5 kg)     Physical Exam  Nursing note and vitals reviewed. Constitutional: She is oriented to person, place, and time. She appears well-developed and well-nourished. No distress.  HENT:  Head: Normocephalic and atraumatic.  Mouth/Throat: Oropharynx is clear and moist and mucous membranes are normal.  Eyes: Pupils are equal, round, and reactive to light. Conjunctivae are normal.  Cardiovascular: Normal rate and regular rhythm.  No murmur heard. Pulses:      Dorsalis pedis pulses are 2+ on the right side and 2+ on the left side.  Respiratory: Effort normal and breath sounds normal. No respiratory distress.  GI: Soft. She exhibits no mass. There is no hepatomegaly. There is no abdominal tenderness.  Musculoskeletal:        General: No edema.  Lymphadenopathy:    She has no cervical adenopathy.  Neurological: She is alert and oriented to person, place, and time. She has normal strength. No cranial nerve deficit. Gait normal.  Skin: Skin is warm. No rash noted. No erythema.  Psychiatric: Her mood appears anxious.  Well groomed, good eye contact.      ASSESSMENT AND PLAN:  Ms. Maxie was seen today for follow-up.  Diagnoses and all orders for this visit:  Chest discomfort Hx does not suggest cardiac etiology. We discussed possible causes. EKG today SR, normal axis and intervals, no signs of ischemia. No other EKG available for comparison. She is not interested in  cardiology referral. Clearly instructed about warning signs.  - EKG 12-Lead  Auditory hallucination t seems like "hallucinations" were caused by Celexa. I do not think further work-up is needed at this time. Instructed about warning signs. Follow-up in 4 weeks.  Anxiety disorder Prozac 20 mg daily resume. Recommend arranging appointment with psychotherapist. Instructed about warning signs. Follow-up in 4 weeks, before if needed.  Depressive disorder Prozac 20 mg resume. Psychotherapy strongly recommended. Instructed about warning signs. Follow-up in 4 weeks.  GERD (gastroesophageal reflux disease) Still symptomatic. Stop omeprazole. She will try Protonix 40 mg daily. GERD precautions also recommended. Instructed to arrange follow-up appointment with her gastroenterologist. Follow-up in 4 weeks.  H/O urinary frequency She is a chronic problem. ?  Overactive bladder. We discussed treatment options as well as side effects, which include constipation.  For now we will hold on pharmacologic treatment. Recommend Keagle exercises and pelvic floor exercises.  IBS (irritable bowel syndrome) We discussed diagnostic criteria, prognosis, and treatment options. She will continue Lasix and 45 mg. MiraLAX twice daily and bisacodyl 5 mg daily as needed. Adequate fiber intake, Benefiber twice daily may help. Recommend increasing fluid intake. Instructed about warning signs. Instructed to arrange appointment with GI. Follow-up in 4 weeks, before if needed.  Insomnia disorder For now recommend good sleep hygiene. Prozac 20 mg daily started today. We may consider adding medication to help her with sleep next visit if needed.    Return in about 4 weeks (around 03/09/2018) for anxiety.    40 min face to face OV. > 50% was dedicated to discussion of Dx, prognosis, treatment options, and some side effects of medications as well as coordination of care.        Kary Colaizzi G. [place],  MD  [facility]. [place] office.

## 2018-02-09 NOTE — Assessment & Plan Note (Signed)
Prozac 20 mg resume. Psychotherapy strongly recommended. Instructed about warning signs. Follow-up in 4 weeks.

## 2018-02-09 NOTE — Assessment & Plan Note (Signed)
Still symptomatic. Stop omeprazole. She will try Protonix 40 mg daily. GERD precautions also recommended. Instructed to arrange follow-up appointment with her gastroenterologist. Follow-up in 4 weeks.

## 2018-02-09 NOTE — Assessment & Plan Note (Signed)
She is a chronic problem. ?  Overactive bladder. We discussed treatment options as well as side effects, which include constipation.  For now we will hold on pharmacologic treatment. Recommend Keagle exercises and pelvic floor exercises.

## 2018-02-09 NOTE — Assessment & Plan Note (Signed)
For now recommend good sleep hygiene. Prozac 20 mg daily started today. We may consider adding medication to help her with sleep next visit if needed.

## 2018-02-09 NOTE — Assessment & Plan Note (Signed)
Prozac 20 mg daily resume. Recommend arranging appointment with psychotherapist. Instructed about warning signs. Follow-up in 4 weeks, before if needed.

## 2018-02-10 ENCOUNTER — Encounter: Payer: Self-pay | Admitting: Family Medicine

## 2018-02-22 ENCOUNTER — Other Ambulatory Visit: Payer: Self-pay

## 2018-02-22 ENCOUNTER — Encounter (HOSPITAL_COMMUNITY): Payer: Self-pay | Admitting: Emergency Medicine

## 2018-02-22 ENCOUNTER — Emergency Department (HOSPITAL_COMMUNITY)
Admission: EM | Admit: 2018-02-22 | Discharge: 2018-02-22 | Disposition: A | Discharge status: Home / Self Care | Payer: Medicare HMO | Attending: Emergency Medicine | Admitting: Emergency Medicine

## 2018-02-22 ENCOUNTER — Emergency Department (HOSPITAL_COMMUNITY): Payer: Medicare HMO

## 2018-02-22 DIAGNOSIS — E230 Hypopituitarism: Secondary | ICD-10-CM | POA: Diagnosis not present

## 2018-02-22 DIAGNOSIS — R350 Frequency of micturition: Secondary | ICD-10-CM | POA: Diagnosis not present

## 2018-02-22 DIAGNOSIS — Z79899 Other long term (current) drug therapy: Secondary | ICD-10-CM | POA: Diagnosis not present

## 2018-02-22 DIAGNOSIS — R440 Auditory hallucinations: Secondary | ICD-10-CM | POA: Diagnosis not present

## 2018-02-22 DIAGNOSIS — K5900 Constipation, unspecified: Secondary | ICD-10-CM | POA: Diagnosis present

## 2018-02-22 DIAGNOSIS — Z9119 Patient's noncompliance with other medical treatment and regimen: Secondary | ICD-10-CM | POA: Insufficient documentation

## 2018-02-22 LAB — COMPREHENSIVE METABOLIC PANEL
ALT: 19 U/L (ref 0–44)
AST: 22 U/L (ref 15–41)
Albumin: 4.1 g/dL (ref 3.5–5.0)
Alkaline Phosphatase: 52 U/L (ref 38–126)
Anion gap: 10 (ref 5–15)
BUN: 11 mg/dL (ref 8–23)
CO2: 24 mmol/L (ref 22–32)
Calcium: 9.6 mg/dL (ref 8.9–10.3)
Chloride: 105 mmol/L (ref 98–111)
Creatinine, Ser: 1.17 mg/dL — ABNORMAL HIGH (ref 0.44–1.00)
GFR calc Af Amer: 55 mL/min — ABNORMAL LOW (ref >60)
GFR calc non Af Amer: 47 mL/min — ABNORMAL LOW (ref >60)
Glucose, Bld: 110 mg/dL — ABNORMAL HIGH (ref 70–99)
Potassium: 3.5 mmol/L (ref 3.5–5.1)
Sodium: 139 mmol/L (ref 135–145)
Total Bilirubin: 0.6 mg/dL (ref 0.3–1.2)
Total Protein: 7.2 g/dL (ref 6.5–8.1)

## 2018-02-22 LAB — CBC
HCT: 38.6 % (ref 36.0–46.0)
Hemoglobin: 12.4 g/dL (ref 12.0–15.0)
MCH: 28.7 pg (ref 26.0–34.0)
MCHC: 32.1 g/dL (ref 30.0–36.0)
MCV: 89.4 fL (ref 80.0–100.0)
Platelets: 227 10*3/uL (ref 150–400)
RBC: 4.32 MIL/uL (ref 3.87–5.11)
RDW: 14.3 % (ref 11.5–15.5)
WBC: 5.8 10*3/uL (ref 4.0–10.5)
nRBC: 0 % (ref 0.0–0.2)

## 2018-02-22 LAB — URINALYSIS, ROUTINE W REFLEX MICROSCOPIC
Bacteria, UA: NONE SEEN
Bilirubin Urine: NEGATIVE
Glucose, UA: NEGATIVE mg/dL
Hgb urine dipstick: NEGATIVE
Ketones, ur: NEGATIVE mg/dL
Nitrite: NEGATIVE
Protein, ur: NEGATIVE mg/dL
Specific Gravity, Urine: 1.01 (ref 1.005–1.030)
pH: 7 (ref 5.0–8.0)

## 2018-02-22 LAB — TROPONIN I: Troponin I: <0.03 ng/mL (ref <0.03)

## 2018-02-22 LAB — RAPID URINE DRUG SCREEN, HOSP PERFORMED
Amphetamines: NOT DETECTED
Barbiturates: NOT DETECTED
Benzodiazepines: NOT DETECTED
Cocaine: NOT DETECTED
Opiates: NOT DETECTED
Tetrahydrocannabinol: NOT DETECTED

## 2018-02-22 LAB — LIPASE, BLOOD: Lipase: 32 U/L (ref 11–51)

## 2018-02-22 MED ORDER — SODIUM CHLORIDE 0.9% FLUSH: 3.0000 mL | Freq: Once | INTRAVENOUS | Status: DC

## 2018-02-22 MED ORDER — POLYETHYLENE GLYCOL 3350 17 G PO PACK
17.0000 g | PACK | Freq: Every day | ORAL | Status: DC
  Administered 2018-02-22: 17 g via ORAL
  Filled 2018-02-22: qty 1

## 2018-02-22 MED ORDER — LINZESS 145 MCG PO CAPS: 145.0000 ug | ORAL_CAPSULE | Freq: Every day | ORAL | 2 refills | Status: DC

## 2018-02-22 MED ORDER — POLYETHYLENE GLYCOL 3350 17 G PO PACK: 17.0000 g | PACK | Freq: Every day | ORAL | 0 refills | Status: DC

## 2018-02-22 NOTE — ED Triage Notes (Signed)
Pt brought in by cousin. Pt reports that she had some abdominal pressure and gas. Cousin states that she needs medical clearance because she has not been compliant with medication. Family reports poor appetite.

## 2018-02-22 NOTE — ED Notes (Signed)
RN attempted IV. Patient was not very compliant. RN will wait to insert IV until further notice from ED provider.

## 2018-02-22 NOTE — Discharge Instructions (Addendum)
You were seen in the ER today for abdominal discomfort.  Your x-ray shows that you are fairly constipated.  We are restarting your Linzess and giving you a prescription for MiraLAX.  Please take these medicines as prescribed.  We have prescribed you new medication(s) today. Discuss the medications prescribed today with your pharmacist as they can have adverse effects and interactions with your other medicines including over the counter and prescribed medications. Seek medical evaluation if you start to experience new or abnormal symptoms after taking one of these medicines, seek care immediately if you start to experience difficulty breathing, feeling of your throat closing, facial swelling, or rash as these could be indications of a more serious allergic reaction  Please take all medications as prescribed.  Please follow-up with primary care and the psychiatrist you were given information for within the next 1 week.  Please return to the emergency department for new or worsening symptoms or any other concerns.

## 2018-02-22 NOTE — ED Provider Notes (Signed)
[facility] DEPT Provider Note   CSN: [id number] Arrival date & time: 02/22/18  1621   History   Chief Complaint Chief Complaint  Patient presents with  . Medical Clearance  . Abdominal Pain    HPI Nancy Herman is a 73 y.o. female with a hx of GERD, hyperlipidemia, urinary incontinence, IBS (constipation), thyroid disease, and pituitary insufficiency who presents to the emergency department with her cousin due to some abdominal discomfort earlier today which is resolved at present.  Patient states that she has been having some issues with hard small bowel movements over the past 24 to 48 hours.  She states this morning she "passed a gallstone".  She states that she thinks that she passed a gallstone because there was only a very small hard thing that came out when she had a bowel movement around 1300 this afternoon. She states shortly after this she consumed a salad with vinaigrette dressing that caused some nausea, refluxe, and abdominal discomfort/bloating. She states these sxs eventually resolved without intervention. Currently she is asymptomatic other than a sensation of pressure in her rectum which is mild and not particularly painful. Passing gas. She was previously on Linzess for IBS but no longer takes this. During the H&P patient started to have some refluxe again. Denies fever, chills, vomiting, melena, hematochezia, BRBPR, or dysuria. She does admit to urinary frequency.   After leaving the exam room patient's cousin discussed with me that the patient is not taking her medications as prescribed and she feels there are psychiatric issues playing a factor. She states patient is having auditory hallucinations, mood swings, and lives alone. They discussed with her PCP earlier this month- DC celexa, re-start prozac as she was previously on this. Patient's cousin states she is just not taking it. Patient admits to auditory hallucinations- states people are  "threatening to take her house, and threatening to hurt her." Denies AH. Denies HI/SI.   HPI  Past Medical History:  Diagnosis Date  . Allergy   . Anxiety   . Dyslipidemia   . Frequent headaches   . GERD (gastroesophageal reflux disease)   . Hyperlipidemia   . IBS (irritable bowel syndrome)   . Pituitary insufficiency ([place])   . Thyroid disease   . Urine incontinence     Patient Active Problem List   Diagnosis Date Noted  . H/O urinary frequency 02/09/2018  . Cervicalgia 03/26/2017  . Dysphagia 12/04/2016  . Primary osteoarthritis of both knees 06/03/2016  . Depressive disorder 05/21/2016  . Cough 03/28/2016  . Pituitary insufficiency ([place]) 12/26/2015  . GERD (gastroesophageal reflux disease) 11/06/2015  . IBS (irritable bowel syndrome) 10/16/2015  . Hyperlipidemia 10/16/2015  . Insomnia disorder 10/16/2015  . Anxiety disorder 10/16/2015  . Allergic rhinitis 10/16/2015  . Pituitary adenoma ([place]) 09/26/2015    Past Surgical History:  Procedure Laterality Date  . ABDOMINAL HYSTERECTOMY  1990  . BRAIN SURGERY    . BREAST EXCISIONAL BIOPSY Right unsure  . BREAST EXCISIONAL BIOPSY Left unsure  . OVARIAN CYST SURGERY  1994  . REPAIR RECTOCELE  2016   and prolapsed uterus.  . TONSILLECTOMY  1973     OB History   No obstetric history on file.      Home Medications    Prior to Admission medications   Medication Sig Start Date End Date Taking? Authorizing Provider  albuterol (PROVENTIL HFA;VENTOLIN HFA) 108 (90 Base) MCG/ACT inhaler Inhale 2 puffs into the lungs every 6 (six) hours as needed  for wheezing or shortness of breath. 01/11/17   Bjorn Pippin, PA-C  Calcium Carbonate-Vit D-Min (CALCIUM 1200 PO) Take 1 tablet by mouth daily.    [provider]  cetirizine (ZYRTEC) 10 MG tablet Take 10 mg by mouth daily.    [provider]  cholecalciferol (VITAMIN D) 1000 units tablet Chew 1 tablet by mouth daily.    [provider]  famotidine  (PEPCID) 20 MG tablet Take 20 mg by mouth daily. 1 tab twice daily    [provider]  FLUoxetine (PROZAC) 20 MG capsule Take 1 capsule (20 mg total) by mouth daily. 02/09/18   Martinique, Betty G, MD  ipratropium (ATROVENT) 0.03 % nasal spray 2 sprays in each nostril twice a day. 06/03/17   [place], Betty G, MD  levothyroxine (SYNTHROID, LEVOTHROID) 50 MCG tablet TAKE 1 TABLET BY MOUTH ONCE DAILY BEFORE BREAKFAST 09/29/17   Renato Shin, MD  LINZESS 145 MCG CAPS capsule Take 1 capsule (145 mcg total) by mouth daily before breakfast. 01/23/18   [place], Betty G, MD  montelukast (SINGULAIR) 10 MG tablet TAKE 1 TABLET BY MOUTH AT BEDTIME 12/08/17   [place], Betty G, MD  pantoprazole (PROTONIX) 40 MG tablet Take 1 tablet (40 mg total) by mouth daily. 02/09/18   [place], Betty G, MD  pravastatin (PRAVACHOL) 20 MG tablet TAKE 1 TABLET BY MOUTH ONCE DAILY 10/17/17   [place], Betty G, MD  Probiotic Product (PROBIOTIC-10 PO) Take by mouth.    [provider]  Spacer/Aero-Holding Chambers (AEROCHAMBER PLUS) inhaler Use as instructed to use with inahaler. 02/24/17   Martin[place], Betty G, MD  triazolam (HALCION) 0.125 MG tablet Take 1/2 hr before MRI 09/26/17   Renato Shin, MD    Family History Family History  Problem Relation Age of Onset  . Heart disease Mother   . Alcohol abuse Mother   . Drug abuse Mother   . Heart disease Father   . Drug abuse Father   . Alcohol abuse Father     Social History Social History   Tobacco Use  . Smoking status: Never Smoker  . Smokeless tobacco: Never Used  Substance Use Topics  . Alcohol use: No  . Drug use: No     Allergies   Other   Review of Systems Review of Systems  Constitutional: Negative for chills and fever.  Respiratory: Negative for shortness of breath.   Cardiovascular: Negative for leg swelling.  Gastrointestinal: Positive for abdominal pain (resolved at present), constipation, nausea and rectal pain (pressure). Negative for anal  bleeding, blood in stool, diarrhea and vomiting.       Positive for refluxe/indigestion  Genitourinary: Positive for frequency. Negative for dysuria.  Neurological: Negative for dizziness, syncope, facial asymmetry, weakness and numbness.  Psychiatric/Behavioral: Positive for hallucinations. Negative for suicidal ideas.  All other systems reviewed and are negative.    Physical Exam Updated Vital Signs BP (!) 149/88 (BP Location: Left Arm)   Pulse 93   Temp 98.2 F (36.8 C) (Oral)   Resp 16   LMP  (LMP Unknown)   SpO2 97%   Physical Exam Vitals signs and nursing note reviewed.  Constitutional:      General: She is not in acute distress.    Appearance: She is well-developed. She is not toxic-appearing.  HENT:     Head: Normocephalic and atraumatic.  Eyes:     General:        Right eye: No discharge.  Left eye: No discharge.     Conjunctiva/sclera: Conjunctivae normal.  Neck:     Musculoskeletal: Neck supple.  Cardiovascular:     Rate and Rhythm: Normal rate and regular rhythm.  Pulmonary:     Effort: Pulmonary effort is normal. No respiratory distress.     Breath sounds: Normal breath sounds. No wheezing, rhonchi or rales.  Abdominal:     General: There is no distension.     Palpations: Abdomen is soft.     Tenderness: There is no abdominal tenderness. There is no guarding or rebound. Negative signs include Murphy's sign and McBurney's sign.  Skin:    General: Skin is warm and dry.  Neurological:     General: No focal deficit present.     Mental Status: She is alert and oriented to person, place, and time.     Comments: Clear speech.   Psychiatric:        Mood and Affect: Mood normal.        Behavior: Behavior normal.        Thought Content: Thought content does not include homicidal or suicidal ideation.     Comments: Admits to auditory hallucinations at times, does not appear to be responding to internal stimuli at this time.       ED Treatments /  Results  Labs (all labs ordered are listed, but only abnormal results are displayed) Labs Reviewed  COMPREHENSIVE METABOLIC PANEL - Abnormal; Notable for the following components:      Result Value   Glucose, Bld 110 (*)    Creatinine, Ser 1.17 (*)    GFR calc non Af Amer 47 (*)    GFR calc Af Amer 55 (*)    All other components within normal limits  URINALYSIS, ROUTINE W REFLEX MICROSCOPIC - Abnormal; Notable for the following components:   Leukocytes, UA MODERATE (*)    All other components within normal limits  URINE CULTURE  LIPASE, BLOOD  CBC  TROPONIN I  RAPID URINE DRUG SCREEN, HOSP PERFORMED    EKG EKG Interpretation  Date/Time:  Sunday February 22 2018 18:49:00 EST Ventricular Rate:  67 PR Interval:    QRS Duration: 81 QT Interval:  393 QTC Calculation: 415 R Axis:   32 Text Interpretation:  Sinus rhythm No STEMI  Confirmed by Nanda Quinton [phone]) on 02/22/2018 6:51:10 PM   Radiology Dg Abdomen Acute W/chest  Result Date: 02/22/2018 CLINICAL DATA:  Chest and abdominal pain EXAM: DG ABDOMEN ACUTE W/ 1V CHEST COMPARISON:  CT 09/04/2017, radiograph 03/28/2016 FINDINGS: Single-view chest demonstrates minimal scar or atelectasis at the lingula and left base. Normal heart size. Aortic atherosclerosis. Supine and upright views of the abdomen demonstrate no free air beneath the diaphragm. Surgical clips in the right upper quadrant. Nonobstructed gas pattern with moderate stool in the colon. Phleboliths in the pelvis. IMPRESSION: Patchy atelectasis or scar at the lingula. Nonobstructed bowel-gas pattern with moderate stool. Electronically Signed   By: Donavan Foil M.D.   On: 02/22/2018 19:32    Procedures Procedures (including critical care time)  Medications Ordered in ED Medications  sodium chloride flush (NS) 0.9 % injection 3 mL (has no administration in time range)     Initial Impression / Assessment and Plan / ED Course  I have reviewed the triage vital signs  and the nursing notes.  Pertinent labs & imaging results that were available during my care of the patient were reviewed by me and considered in my medical decision making (see chart  for details).   Patient presents to the ED for abdominal pain with constipation & reflux. Family also w/ concern for auditory hallucinations and lack of medication compliance. Patient is nontoxic appearing, in no apparent distress, vitals WNL other than elevated BP- doubt HTN emergency. She has a re-assuring exam with a completely nontender abdomen, no peritoneal signs. She does not appear to be actively responding to internal stimuli. Evaluation with labs, EKG, & acute chest/abdominal series xray.   Work up reviewed:  CBC: Unremarkable. No leukocytosis or anemia.  CMP: No significant electrolyte disturbance. Mild elevated in creatinine to 1.17 in comparison to 1.01- 1.05 on record. LFTs WNL  Lipase: WNL Urinalysis: Moderate leukocytes, no bacteria, nitrite negative- will culture and hold off on tx for UTI.  DG chest/abdomen: Nonobstructed bowel-gas pattern with moderate stool.  EKG: No STEMI, trop negative- obtained secondary to reflux/nausea in elderly patient, doubt ACS.   Repeat abdominal exam remains without peritoneal signs- doubt obstruction/perforation, cholecystitis, pancreatitis, diverticulitis, appendicitis. Suspect more due to constipation & GERD, hx of IBS not taking her Linzes- will re-start this and provide prescription for Miralax.  Regarding psychiatric concerns- per chart review patient's PCP is aware of this concern, seen in office 01/06, plan for adjustment of anti- anxiety medications & psychiatry follow up. Per chart review & discussion with patient & family there does not seem to be an acute change that would require psychiatric admission therefore TTS not consulted. Will discharge home as above with recommendations for close PCP follow up and for follow up with psychiatry as initially planned-  patient states she has made this appointment for sometime in February. We will also place a consult to case management for home health to assist patient at home given patient seems to not be taking her medicines, has poor understanding, multiple medical conditions, and lives alone. I discussed results, treatment plan, need for follow-up, and return precautions with the patient & her family at bedside. Provided opportunity for questions, patient & family confirmed understanding and are in agreement with plan.   Findings and plan of care discussed with supervising physician Dr. Laverta Baltimore who is in agreement.   Final Clinical Impressions(s) / ED Diagnoses   Final diagnoses:  Constipation, unspecified constipation type    ED Discharge Orders         Ordered    LINZESS 145 MCG CAPS capsule  Daily before breakfast     02/22/18 2055    polyethylene glycol [facility]) packet  Daily     02/22/18 2055           Amaryllis Dyke, PA-C 02/22/18 2058    Margette Fast, MD 02/22/18 2334

## 2018-02-24 ENCOUNTER — Telehealth: Payer: Self-pay | Admitting: *Deleted

## 2018-02-24 LAB — URINE CULTURE: Culture: 40000 — AB

## 2018-02-24 NOTE — Telephone Encounter (Signed)
Amye Grego J. Nicanor Mendolia, RN, BSN, NCM [phone] Spoke with pt via telephone regarding discharge planning for Home Health Services. Offered pt list of home health agencies to choose from.  Pt chose [facility]  to render services. Cheryl of [facility] notified. Patient made aware that [facility] will be in contact in 24-48 hours.  No DME needs identified at this time. 

## 2018-02-25 ENCOUNTER — Telehealth: Payer: Self-pay

## 2018-02-25 NOTE — Telephone Encounter (Signed)
No treatment for [facility] ED 02/22/2018 per Lenn S[facility]

## 2018-02-27 ENCOUNTER — Other Ambulatory Visit: Payer: Self-pay

## 2018-02-27 ENCOUNTER — Emergency Department (HOSPITAL_COMMUNITY): Payer: Medicare HMO

## 2018-02-27 ENCOUNTER — Emergency Department (HOSPITAL_COMMUNITY)
Admission: EM | Admit: 2018-02-27 | Discharge: 2018-02-27 | Disposition: A | Discharge status: Home / Self Care | Payer: Medicare HMO | Attending: Emergency Medicine | Admitting: Emergency Medicine

## 2018-02-27 DIAGNOSIS — E785 Hyperlipidemia, unspecified: Secondary | ICD-10-CM | POA: Insufficient documentation

## 2018-02-27 DIAGNOSIS — R079 Chest pain, unspecified: Secondary | ICD-10-CM | POA: Diagnosis present

## 2018-02-27 DIAGNOSIS — Z79899 Other long term (current) drug therapy: Secondary | ICD-10-CM | POA: Diagnosis not present

## 2018-02-27 DIAGNOSIS — K219 Gastro-esophageal reflux disease without esophagitis: Secondary | ICD-10-CM | POA: Insufficient documentation

## 2018-02-27 LAB — CBC WITH DIFFERENTIAL/PLATELET
Abs Immature Granulocytes: 0.02 10*3/uL (ref 0.00–0.07)
Basophils Absolute: 0.1 10*3/uL (ref 0.0–0.1)
Basophils Relative: 1 %
Eosinophils Absolute: 0.2 10*3/uL (ref 0.0–0.5)
Eosinophils Relative: 4 %
HCT: 38.5 % (ref 36.0–46.0)
Hemoglobin: 12.3 g/dL (ref 12.0–15.0)
Immature Granulocytes: 0 %
Lymphocytes Relative: 27 %
Lymphs Abs: 1.5 10*3/uL (ref 0.7–4.0)
MCH: 27.6 pg (ref 26.0–34.0)
MCHC: 31.9 g/dL (ref 30.0–36.0)
MCV: 86.5 fL (ref 80.0–100.0)
Monocytes Absolute: 0.5 10*3/uL (ref 0.1–1.0)
Monocytes Relative: 9 %
Neutro Abs: 3.2 10*3/uL (ref 1.7–7.7)
Neutrophils Relative %: 59 %
Platelets: 206 10*3/uL (ref 150–400)
RBC: 4.45 MIL/uL (ref 3.87–5.11)
RDW: 14.1 % (ref 11.5–15.5)
WBC: 5.4 10*3/uL (ref 4.0–10.5)
nRBC: 0 % (ref 0.0–0.2)

## 2018-02-27 LAB — BASIC METABOLIC PANEL
Anion gap: 9 (ref 5–15)
BUN: 9 mg/dL (ref 8–23)
CO2: 25 mmol/L (ref 22–32)
Calcium: 9.6 mg/dL (ref 8.9–10.3)
Chloride: 106 mmol/L (ref 98–111)
Creatinine, Ser: 1.08 mg/dL — ABNORMAL HIGH (ref 0.44–1.00)
GFR calc Af Amer: >60 mL/min (ref >60)
GFR calc non Af Amer: 52 mL/min — ABNORMAL LOW (ref >60)
Glucose, Bld: 96 mg/dL (ref 70–99)
Potassium: 3.5 mmol/L (ref 3.5–5.1)
Sodium: 140 mmol/L (ref 135–145)

## 2018-02-27 LAB — TROPONIN I: Troponin I: <0.03 ng/mL (ref <0.03)

## 2018-02-27 MED ORDER — ALUM & MAG HYDROXIDE-SIMETH 200-200-20 MG/5ML PO SUSP
30.0000 mL | Freq: Once | ORAL | Status: AC
  Administered 2018-02-27: 30 mL via ORAL
  Filled 2018-02-27: qty 30

## 2018-02-27 MED ORDER — FAMOTIDINE 20 MG PO TABS
20.0000 mg | ORAL_TABLET | Freq: Once | ORAL | Status: AC
  Administered 2018-02-27: 20 mg via ORAL
  Filled 2018-02-27: qty 1

## 2018-02-27 MED ORDER — LORAZEPAM 1 MG PO TABS
1.0000 mg | ORAL_TABLET | Freq: Once | ORAL | Status: AC
  Administered 2018-02-27: 1 mg via ORAL
  Filled 2018-02-27: qty 1

## 2018-02-27 MED ORDER — LIDOCAINE VISCOUS HCL 2 % MT SOLN
15.0000 mL | Freq: Once | OROMUCOSAL | Status: AC
  Administered 2018-02-27: 15 mL via ORAL
  Filled 2018-02-27: qty 15

## 2018-02-27 NOTE — ED Provider Notes (Signed)
[facility] EMERGENCY DEPARTMENT Provider Note   CSN: [id number] Arrival date & time: 02/27/18  0712     History   Chief Complaint Chief Complaint  Patient presents with  . Chest Pain    HPI Nancy Herman is a 73 y.o. female.  HPI   73 year old female with chest pain.  Patient is extremely anxious.  When I initially walked into her room she told me "I want to see a female doctor" before could even intorduce myself.  She is very animated in describing her last ED visit where she feels like she was not "taken seriously."  She says she wants to see a female for this reason (she was actually seen by a female mid-level on her last visit). She then became upset about "how you can just sit there so calm when I have all of this going on." I was eventually able to reassure her that she would be taken seriously and that were all here to try to help her.  He says that she has been having chest pain which she describes as "burning" since around the time she was last seen in the emergency room on 02/22/2018.  Sometimes radiates into her back.  Seems to be worse at night although does get it during the day sometimes as well.  She also states that she "gets acid in my throat."  She reports she has a past history of GERD.  She says she is on medication for it and is compliant.  Past Medical History:  Diagnosis Date  . Allergy   . Anxiety   . Dyslipidemia   . Frequent headaches   . GERD (gastroesophageal reflux disease)   . Hyperlipidemia   . IBS (irritable bowel syndrome)   . Pituitary insufficiency ([place])   . Thyroid disease   . Urine incontinence     Patient Active Problem List   Diagnosis Date Noted  . H/O urinary frequency 02/09/2018  . Cervicalgia 03/26/2017  . Dysphagia 12/04/2016  . Primary osteoarthritis of both knees 06/03/2016  . Depressive disorder 05/21/2016  . Cough 03/28/2016  . Pituitary insufficiency ([place]) 12/26/2015  . GERD (gastroesophageal reflux  disease) 11/06/2015  . IBS (irritable bowel syndrome) 10/16/2015  . Hyperlipidemia 10/16/2015  . Insomnia disorder 10/16/2015  . Anxiety disorder 10/16/2015  . Allergic rhinitis 10/16/2015  . Pituitary adenoma ([place]) 09/26/2015    Past Surgical History:  Procedure Laterality Date  . ABDOMINAL HYSTERECTOMY  1990  . BRAIN SURGERY    . BREAST EXCISIONAL BIOPSY Right unsure  . BREAST EXCISIONAL BIOPSY Left unsure  . OVARIAN CYST SURGERY  1994  . REPAIR RECTOCELE  2016   and prolapsed uterus.  . TONSILLECTOMY  1973     OB History   No obstetric history on file.      Home Medications    Prior to Admission medications   Medication Sig Start Date End Date Taking? Authorizing Provider  Calcium Carbonate-Vit D-Min (CALCIUM 1200 PO) Take 1 tablet by mouth daily after breakfast.     [provider]  FLUoxetine (PROZAC) 20 MG capsule Take 1 capsule (20 mg total) by mouth daily. 02/09/18   [place], Betty G, MD  levothyroxine (SYNTHROID, LEVOTHROID) 50 MCG tablet TAKE 1 TABLET BY MOUTH ONCE DAILY BEFORE BREAKFAST Patient taking differently: Take 50 mcg by mouth daily before breakfast.  09/29/17   Renato Shin, MD  LINZESS 145 MCG CAPS capsule Take 1 capsule (145 mcg total) by mouth daily before breakfast. 02/22/18  Petrucelli, Samantha R, PA-C  montelukast (SINGULAIR) 10 MG tablet TAKE 1 TABLET BY MOUTH AT BEDTIME Patient taking differently: Take 10 mg by mouth at bedtime.  12/08/17   [place], Betty G, MD  pantoprazole (PROTONIX) 40 MG tablet Take 1 tablet (40 mg total) by mouth daily. 02/09/18   [place], Betty G, MD  polyethylene glycol [facility]) packet Take 17 g by mouth daily. 02/22/18   Petrucelli, Samantha R, PA-C  pravastatin (PRAVACHOL) 20 MG tablet TAKE 1 TABLET BY MOUTH ONCE DAILY Patient taking differently: Take 20 mg by mouth every evening.  10/17/17   [place], Betty G, MD    Family History Family History  Problem Relation Age of Onset  . Heart disease Mother   . Alcohol  abuse Mother   . Drug abuse Mother   . Heart disease Father   . Drug abuse Father   . Alcohol abuse Father     Social History Social History   Tobacco Use  . Smoking status: Never Smoker  . Smokeless tobacco: Never Used  Substance Use Topics  . Alcohol use: No  . Drug use: No     Allergies   Other   Review of Systems Review of Systems  All systems reviewed and negative, other than as noted in HPI.  Physical Exam Updated Vital Signs Pulse 78   Temp 98.5 F (36.9 C) (Oral)   Resp 16   Ht 5\' 3"  (1.6 m)   Wt 68.2 kg   LMP  (LMP Unknown)   SpO2 100%   BMI 26.64 kg/m   Physical Exam Vitals signs and nursing note reviewed.  Constitutional:      General: She is not in acute distress.    Appearance: She is well-developed.  HENT:     Head: Normocephalic and atraumatic.  Eyes:     General:        Right eye: No discharge.        Left eye: No discharge.     Conjunctiva/sclera: Conjunctivae normal.  Neck:     Musculoskeletal: Neck supple.  Cardiovascular:     Rate and Rhythm: Normal rate and regular rhythm.     Heart sounds: Normal heart sounds. No murmur. No friction rub. No gallop.   Pulmonary:     Effort: Pulmonary effort is normal. No respiratory distress.     Breath sounds: Normal breath sounds.  Abdominal:     General: There is no distension.     Palpations: Abdomen is soft.     Tenderness: There is no abdominal tenderness.  Musculoskeletal:        General: No tenderness.  Skin:    General: Skin is warm and dry.  Neurological:     Mental Status: She is alert.  Psychiatric:        Mood and Affect: Mood is anxious.        Behavior: Behavior normal.        Thought Content: Thought content normal.      ED Treatments / Results  Labs (all labs ordered are listed, but only abnormal results are displayed) Labs Reviewed  BASIC METABOLIC PANEL - Abnormal; Notable for the following components:      Result Value   Creatinine, Ser 1.08 (*)    GFR calc  non Af Amer 52 (*)    All other components within normal limits  CBC WITH DIFFERENTIAL/PLATELET  TROPONIN I    EKG EKG Interpretation  Date/Time:  Friday February 27 2018 07:27:35 EST Ventricular Rate:  73 PR Interval:    QRS Duration: 82 QT Interval:  387 QTC Calculation: 427 R Axis:   49 Text Interpretation:  Sinus rhythm Confirmed by Virgel Manifold [phone]) on 02/27/2018 7:34:40 AM   Radiology Dg Chest 2 View  Result Date: 02/27/2018 CLINICAL DATA:  Chest pain EXAM: CHEST - 2 VIEW COMPARISON:  02/22/2018 FINDINGS: Normal heart size and vascularity. Minor basilar atelectasis versus scarring over the hemidiaphragms. No definite focal pneumonia, collapse or consolidation. Negative for edema, effusion, or pneumothorax. Trachea midline. Aorta atherosclerotic. IMPRESSION: Minor basilar atelectasis versus scarring. No definite acute process or significant interval change. Electronically Signed   By: Jerilynn Mages.  Shick M.D.   On: 02/27/2018 09:02    Procedures Procedures (including critical care time)  Medications Ordered in ED Medications  alum & mag hydroxide-simeth (MAALOX/MYLANTA) 200-200-20 MG/5ML suspension 30 mL (has no administration in time range)    And  lidocaine (XYLOCAINE) 2 % viscous mouth solution 15 mL (has no administration in time range)  famotidine (PEPCID) tablet 20 mg (has no administration in time range)  LORazepam (ATIVAN) tablet 1 mg (has no administration in time range)     Initial Impression / Assessment and Plan / ED Course  I have reviewed the triage vital signs and the nursing notes.  Pertinent labs & imaging results that were available during my care of the patient were reviewed by me and considered in my medical decision making (see chart for details).     73 year old female with chest pain.  Her description sounds a whole lot like reflux. (substernal, burning, acid in her throat, worse at night, etc).  He is also extremely anxious.  Continue to try to  reassure.  Will obtain some basic testing for screening purposes.  We will treat her symptoms.  I generally have a low suspicion for emergent process though.  Pt feeling better. ED w/u unremarkable. I doubt ACS, PE, dissection or other emergent process. I encouraged her to be compliant with her protonix.   It has been determined that no acute conditions requiring further emergency intervention are present at this time. The patient has been advised of the diagnosis and plan. I reviewed any labs and imaging including any potential incidental findings. I have reviewed nursing notes and appropriate previous records. We have discussed signs and symptoms that warrant return to the ED and they are listed in the discharge instructions.      Final Clinical Impressions(s) / ED Diagnoses   Final diagnoses:  Chest pain, unspecified type  Gastroesophageal reflux disease, esophagitis presence not specified    ED Discharge Orders    None       Virgel Manifold, MD 03/02/18 2052

## 2018-02-27 NOTE — ED Notes (Signed)
Pt given discharge instructions and follow up information. Pt verbalized understanding.  

## 2018-02-27 NOTE — ED Triage Notes (Signed)
Pt BIB [facility]. EMS reports pt was throwing objects at the door when they arrived because she "didn't know who was there." EMS reports that pt was complaining of burning in her chest and throat and stated that she hasn't slept for three days. EMS reports pt was very anxious. Pt reports 8/10 chest pain described as a "burning," and throat pain from "acid."

## 2018-03-04 ENCOUNTER — Ambulatory Visit: Payer: Medicare HMO | Admitting: Endocrinology

## 2018-03-04 ENCOUNTER — Encounter: Payer: Self-pay | Admitting: Endocrinology

## 2018-03-04 VITALS — BP 110/68 | HR 90 | Ht 63.0 in | Wt 143.8 lb

## 2018-03-04 DIAGNOSIS — E230 Hypopituitarism: Secondary | ICD-10-CM

## 2018-03-04 LAB — T4, FREE: Free T4: 0.94 ng/dL (ref 0.60–1.60)

## 2018-03-04 LAB — LUTEINIZING HORMONE: LH: 5.26 m[IU]/mL

## 2018-03-04 LAB — FOLLICLE STIMULATING HORMONE: FSH: 19.8 m[IU]/mL

## 2018-03-04 LAB — TSH: TSH: 1.16 u[IU]/mL (ref 0.35–4.50)

## 2018-03-04 LAB — CORTISOL: Cortisol, Plasma: 10.8 ug/dL

## 2018-03-04 NOTE — Progress Notes (Signed)
Subjective:    Patient ID: Nancy Herman, female    DOB: 1946-01-17, 73 y.o.   MRN: [id number]  HPI Pt returns for f/u of pituitary adenoma (was 1.8 mm diameter; dx'ed and resected in 2017 (in [place]); she did not have XRT; she now takes only synthroid;  f/u MRI in 2019 showed no residual tumor).  pt states she feels well in general.  She had these pit function results in 2017:  FSH/LH: normal (not menopausal).   Prol: normal ACTH: stim test normal GH: IGF-1 was normal VP: USG=1.010, VP was undetectable; 24-HR urine was 2300 ml.   TSH: euthyroid on synthroid.  She has been off synthroid x 1 week.  pt states she feels well in general, except for fatigue.   Past Medical History:  Diagnosis Date  . Allergy   . Anxiety   . Dyslipidemia   . Frequent headaches   . GERD (gastroesophageal reflux disease)   . Hyperlipidemia   . IBS (irritable bowel syndrome)   . Pituitary insufficiency ([place])   . Thyroid disease   . Urine incontinence     Past Surgical History:  Procedure Laterality Date  . ABDOMINAL HYSTERECTOMY  1990  . BRAIN SURGERY    . BREAST EXCISIONAL BIOPSY Right unsure  . BREAST EXCISIONAL BIOPSY Left unsure  . OVARIAN CYST SURGERY  1994  . REPAIR RECTOCELE  2016   and prolapsed uterus.  . TONSILLECTOMY  1973    Social History   Socioeconomic History  . Marital status: Single    Spouse name: Not on file  . Number of children: Not on file  . Years of education: Not on file  . Highest education level: Not on file  Occupational History  . Not on file  Social Needs  . Financial resource strain: Not on file  . Food insecurity:    Worry: Not on file    Inability: Not on file  . Transportation needs:    Medical: Not on file    Non-medical: Not on file  Tobacco Use  . Smoking status: Never Smoker  . Smokeless tobacco: Never Used  Substance and Sexual Activity  . Alcohol use: No  . Drug use: No  . Sexual activity: Never  Lifestyle  . Physical activity:   Days per week: Not on file    Minutes per session: Not on file  . Stress: Not on file  Relationships  . Social connections:    Talks on phone: Not on file    Gets together: Not on file    Attends religious service: Not on file    Active member of club or organization: Not on file    Attends meetings of clubs or organizations: Not on file    Relationship status: Not on file  . Intimate partner violence:    Fear of current or ex partner: Not on file    Emotionally abused: Not on file    Physically abused: Not on file    Forced sexual activity: Not on file  Other Topics Concern  . Not on file  Social History Narrative  . Not on file    Current Outpatient Medications on File Prior to Visit  Medication Sig Dispense Refill  . Calcium Carbonate-Vit D-Min (CALCIUM 1200 PO) Take 1 tablet by mouth daily after breakfast.     . FLUoxetine (PROZAC) 20 MG capsule Take 1 capsule (20 mg total) by mouth daily. 30 capsule 2  . levothyroxine (SYNTHROID, LEVOTHROID) 50 MCG tablet  TAKE 1 TABLET BY MOUTH ONCE DAILY BEFORE BREAKFAST (Patient taking differently: Take 50 mcg by mouth daily before breakfast. ) 90 tablet 2  . LINZESS 145 MCG CAPS capsule Take 1 capsule (145 mcg total) by mouth daily before breakfast. 30 capsule 2  . montelukast (SINGULAIR) 10 MG tablet TAKE 1 TABLET BY MOUTH AT BEDTIME (Patient taking differently: Take 10 mg by mouth at bedtime. ) 90 tablet 0  . pantoprazole (PROTONIX) 40 MG tablet Take 1 tablet (40 mg total) by mouth daily. 30 tablet 3  . polyethylene glycol (MIRALAX) packet Take 17 g by mouth daily. 14 each 0  . pravastatin (PRAVACHOL) 20 MG tablet TAKE 1 TABLET BY MOUTH ONCE DAILY (Patient taking differently: Take 20 mg by mouth every evening. ) 90 tablet 1   No current facility-administered medications on file prior to visit.     Allergies  Allergen Reactions  . Other     Environment     Family History  Problem Relation Age of Onset  . Heart disease Mother   .  Alcohol abuse Mother   . Drug abuse Mother   . Heart disease Father   . Drug abuse Father   . Alcohol abuse Father     BP 110/68 (BP Location: Left Arm, Patient Position: Sitting, Cuff Size: Normal)   Pulse 90   Ht 5\' 3"  (1.6 m)   Wt 143 lb 12.8 oz (65.2 kg)   LMP  (LMP Unknown)   SpO2 96%   BMI 25.47 kg/m    Review of Systems Denies polyuria; she has lost 11 lbs since last ov.      Objective:   Physical Exam VITAL SIGNS:  See vs page GENERAL: no distress Ext: 1+ bilat leg edema.   GAIT: normal and steady.  Lab Results  Component Value Date   CREATININE 1.08 (H) 02/27/2018   BUN 9 02/27/2018   NA 140 02/27/2018   K 3.5 02/27/2018   CL 106 02/27/2018   CO2 25 02/27/2018   Lab Results  Component Value Date   TSH 1.87 02/05/2018        Assessment & Plan:  Pituitary adenoma: no evidence of recurrence Central hypothyroidism: ot wants trial off rx to be more certain about dx: [place] with me.  Recheck TFT in 1 month.  Patient Instructions  Blood tests are requested for you today.  We'll let you know about the results.  Please come back for a follow-up appointment in 6 months.

## 2018-03-04 NOTE — Patient Instructions (Addendum)
Blood tests are requested for you today.  We'll let you know about the results.  Please come back for a follow-up appointment in 6 months.   

## 2018-03-05 LAB — PROLACTIN: Prolactin: 5 ng/mL

## 2018-03-06 ENCOUNTER — Ambulatory Visit: Payer: Medicare HMO | Admitting: Psychology

## 2018-03-07 ENCOUNTER — Telehealth: Payer: Self-pay | Admitting: Endocrinology

## 2018-03-07 NOTE — Telephone Encounter (Signed)
please call patient: I need to add to the message I sent you.  Because you were off the levothyroxine x just 1 week, please stay off it, and recheck the thyroid blood tests in 1 month.  I have ordered.

## 2018-03-09 ENCOUNTER — Other Ambulatory Visit: Payer: Self-pay

## 2018-03-09 ENCOUNTER — Encounter: Payer: Self-pay | Admitting: Family Medicine

## 2018-03-09 ENCOUNTER — Ambulatory Visit (INDEPENDENT_AMBULATORY_CARE_PROVIDER_SITE_OTHER): Payer: Medicare HMO | Admitting: Family Medicine

## 2018-03-09 VITALS — BP 140/78 | HR 62 | Temp 98.5°F | Resp 12 | Wt 143.2 lb

## 2018-03-09 DIAGNOSIS — R079 Chest pain, unspecified: Secondary | ICD-10-CM | POA: Diagnosis not present

## 2018-03-09 DIAGNOSIS — K581 Irritable bowel syndrome with constipation: Secondary | ICD-10-CM

## 2018-03-09 DIAGNOSIS — F329 Major depressive disorder, single episode, unspecified: Secondary | ICD-10-CM

## 2018-03-09 DIAGNOSIS — K219 Gastro-esophageal reflux disease without esophagitis: Secondary | ICD-10-CM

## 2018-03-09 DIAGNOSIS — F411 Generalized anxiety disorder: Secondary | ICD-10-CM | POA: Diagnosis not present

## 2018-03-09 DIAGNOSIS — F32A Depression, unspecified: Secondary | ICD-10-CM

## 2018-03-09 MED ORDER — PANTOPRAZOLE SODIUM 40 MG PO TBEC: 40.0000 mg | DELAYED_RELEASE_TABLET | Freq: Every day | ORAL | 3 refills | Status: DC

## 2018-03-09 MED ORDER — FLUOXETINE HCL 20 MG PO CAPS: 20.0000 mg | ORAL_CAPSULE | Freq: Every day | ORAL | 2 refills | Status: DC

## 2018-03-09 MED ORDER — PRAVASTATIN SODIUM 20 MG PO TABS: 20.0000 mg | ORAL_TABLET | Freq: Every day | ORAL | 1 refills | Status: DC

## 2018-03-09 NOTE — Patient Instructions (Addendum)
A few things to remember from today's visit:   Irritable bowel syndrome with constipation - Plan: Ambulatory referral to Gastroenterology  Generalized anxiety disorder - Plan: FLUoxetine (PROZAC) 20 MG capsule  Depressive disorder - Plan: FLUoxetine (PROZAC) 20 MG capsule  Chest pain, unspecified type - Plan: Ambulatory referral to Cardiology  Gastroesophageal reflux disease, esophagitis presence not specified - Plan: pantoprazole (PROTONIX) 40 MG tablet  Miralax and Bisacodyl 5 mg daily at least for 2-3 weeks before deciding that it is not helping. Resume Prozac.  GI and cardiologist appt will be arranged.   Please be sure medication list is accurate. If a new problem present, please set up appointment sooner than planned today.

## 2018-03-09 NOTE — Assessment & Plan Note (Signed)
She denies symptoms today. Recommend resuming Prozac 20 mg daily. Instructed about warning signs.

## 2018-03-09 NOTE — Progress Notes (Signed)
HPI:   Nancy Herman is a 73 y.o. female, who is here today with her son to follow on recent ER visit.  She was in the ER on 02/27/18 c/o chest pain. Left sided and mid chest pain tightness sensation, worse at night when lying down. It is not related to exertion. She has not identified exacerbating or alleviating factors. No associated dyspnea palpitations, or diaphoresis. Pain is not radiated.  She is concerned about cardiac etiology. In 04/2015 she underwent Lexiscan stress test, which was negative for ischemia, normal LVEF. EKG done in the ER on 02/27/2018 was negative for acute ischemia.  Retrosternal burning sensation and acid reflux.  Currently she is on Protonix, which does not seem to be helping. In the past he has been on Nexium and omeprazole.  She has follow with GI, Dr. Man. She would like to have a second opinion, so requested referral to see GI.  She was also in the ER on 02/22/2018 because of abdominal pain.  She states that she "passed a stone" while she was in the ER, upset because it was not mentioned at the time of the discharge, diagnosed with constipation.  She is not taking MiraLAX as I recommended, she took it once and did not help. Linzess is not covered by her insurance. [place] this implies that Linzess, which she can afford. Abdominal x-ray: Patchy atelectasis or scar at the lingula. Nonobstructed bowel-gas pattern with moderate stool.   Lab Results  Component Value Date   CREATININE 1.08 (H) 02/27/2018   BUN 9 02/27/2018   NA 140 02/27/2018   K 3.5 02/27/2018   CL 106 02/27/2018   CO2 25 02/27/2018   Lab Results  Component Value Date   WBC 5.4 02/27/2018   HGB 12.3 02/27/2018   HCT 38.5 02/27/2018   MCV 86.5 02/27/2018   PLT 206 02/27/2018    She is a still concerned about "gas." She feels like constipation is better, she is having small bowel movements daily but still straining. Occasionally she sees some blood on  toilet paper usually exacerbated by straining.  Colonoscopy in 04/2014.  She is also concerned about urinary frequency, which has been going on for a while now. UA has been otherwise normal. We had discussed pharmacologic treatments in the past but due to side effects she has refused to try. She denies dysuria, decreased urine output, gross hematuria, or foam in urine.  Anxiety and depression: She has taken Prozac 20 mg twice since her last visit. She states that she does not feel like she needs medication. Still concerned about her health, with several complaints. She denies depressed mood or suicidal thoughts.  She is also requesting a refill on all her medications, she states that it was some type of "contamination" around her house, so she discarded all her medications.  Review of Systems  Constitutional: Positive for fatigue. Negative for activity change, appetite change and fever.  HENT: Negative for mouth sores, nosebleeds and trouble swallowing.   Eyes: Negative for redness and visual disturbance.  Respiratory: Negative for cough, shortness of breath and wheezing.   Cardiovascular: Positive for chest pain. Negative for palpitations and leg swelling.  Gastrointestinal: Positive for constipation. Negative for abdominal pain, blood in stool, nausea and vomiting.  Genitourinary: Negative for decreased urine volume, dysuria and hematuria.  Skin: Negative for pallor and rash.  Neurological: Negative for syncope, weakness and headaches.  Psychiatric/Behavioral: Negative for confusion. The patient is nervous/anxious.  Current Outpatient Medications on File Prior to Visit  Medication Sig Dispense Refill  . Calcium Carbonate-Vit D-Min (CALCIUM 1200 PO) Take 1 tablet by mouth daily after breakfast.     . LINZESS 145 MCG CAPS capsule Take 1 capsule (145 mcg total) by mouth daily before breakfast. 30 capsule 2  . montelukast (SINGULAIR) 10 MG tablet TAKE 1 TABLET BY MOUTH AT  BEDTIME (Patient taking differently: Take 10 mg by mouth at bedtime. ) 90 tablet 0  . polyethylene glycol (MIRALAX) packet Take 17 g by mouth daily. 14 each 0   No current facility-administered medications on file prior to visit.      Past Medical History:  Diagnosis Date  . Allergy   . Anxiety   . Dyslipidemia   . Frequent headaches   . GERD (gastroesophageal reflux disease)   . Hyperlipidemia   . IBS (irritable bowel syndrome)   . Pituitary insufficiency ([place])   . Thyroid disease   . Urine incontinence    Allergies  Allergen Reactions  . Other     Environment     Social History   Socioeconomic History  . Marital status: Single    Spouse name: Not on file  . Number of children: Not on file  . Years of education: Not on file  . Highest education level: Not on file  Occupational History  . Not on file  Social Needs  . Financial resource strain: Not on file  . Food insecurity:    Worry: Not on file    Inability: Not on file  . Transportation needs:    Medical: Not on file    Non-medical: Not on file  Tobacco Use  . Smoking status: Never Smoker  . Smokeless tobacco: Never Used  Substance and Sexual Activity  . Alcohol use: No  . Drug use: No  . Sexual activity: Never  Lifestyle  . Physical activity:    Days per week: Not on file    Minutes per session: Not on file  . Stress: Not on file  Relationships  . Social connections:    Talks on phone: Not on file    Gets together: Not on file    Attends religious service: Not on file    Active member of club or organization: Not on file    Attends meetings of clubs or organizations: Not on file    Relationship status: Not on file  Other Topics Concern  . Not on file  Social History Narrative  . Not on file    Vitals:   03/09/18 1117  BP: 140/78  Pulse: 62  Resp: 12  Temp: 98.5 F (36.9 C)  SpO2: 97%   Body mass index is 25.37 kg/m.    Physical Exam  Nursing note and vitals  reviewed. Constitutional: She is oriented to person, place, and time. She appears well-developed and well-nourished. No distress.  HENT:  Head: Normocephalic and atraumatic.  Mouth/Throat: Oropharynx is clear and moist and mucous membranes are normal.  Eyes: Conjunctivae are normal.  Cardiovascular: Normal rate and regular rhythm.  No murmur heard. Pulses:      Dorsalis pedis pulses are 2+ on the right side and 2+ on the left side.  Respiratory: Effort normal and breath sounds normal. No respiratory distress.  GI: Soft. She exhibits no mass. There is no hepatomegaly. There is no abdominal tenderness.  Musculoskeletal:        General: No edema.  Lymphadenopathy:    She has no cervical adenopathy.  Neurological: She is alert and oriented to person, place, and time. She has normal strength. No cranial nerve deficit. Gait normal.  Skin: Skin is warm. No rash noted. No erythema.  Psychiatric: Her mood appears anxious.  Well groomed, good eye contact.    ASSESSMENT AND PLAN:  Nancy Herman was seen today for follow-up.   Orders Placed This Encounter  Procedures  . Ambulatory referral to Gastroenterology  . Ambulatory referral to Cardiology    Chest pain, unspecified type We discussed possible etiologies, history does not suggest cardiac etiology. Most likely GERD. She would like referral to cardiologist. Clearly instructed about warning signs.  IBS (irritable bowel syndrome) We discussed diagnosis again and prognosis. Recommend trying MiraLAX and bisacodyl for 2 to 3 weeks before she decides to stop it. Linzess 145 mcg helped a little but to 90 mcg caused diarrhea, she would like to try this medication again but she cannot afford it. Adequate fiber and fluid intake also recommended. Instructed about warning signs. I requested GI referral was placed.  Anxiety disorder Problem is not well controlled. Recommend resuming Prozac 20 mg daily. Strongly recommend keeping  appointment with psychotherapist. We discussed some side effects of medication. Instructed about warning signs. Follow-up in 2 months.  GERD (gastroesophageal reflux disease) Problem is not well controlled. Most likely this problem is causing her chest discomfort. She has tried Nexium and omeprazole in the past. For now I recommend continuing Protonix as well as GERD precautions. GI referral placed.  Depressive disorder She denies symptoms today. Recommend resuming Prozac 20 mg daily. Instructed about warning signs.    35 min face to face OV. > 50% was dedicated to discussion of Dx, prognosis, treatment options, and some side effects of medications.     Chalyn Amescua G. Martinique, MD  [facility]. [place] office.

## 2018-03-09 NOTE — Telephone Encounter (Signed)
SECOND ATTEMPT: ° °LVM requesting returned call. °

## 2018-03-09 NOTE — Telephone Encounter (Signed)
LVM requesting returned call 

## 2018-03-09 NOTE — Assessment & Plan Note (Signed)
Problem is not well controlled. Most likely this problem is causing her chest discomfort. She has tried Nexium and omeprazole in the past. For now I recommend continuing Protonix as well as GERD precautions. GI referral placed.

## 2018-03-09 NOTE — Assessment & Plan Note (Signed)
We discussed diagnosis again and prognosis. Recommend trying MiraLAX and bisacodyl for 2 to 3 weeks before she decides to stop it. Linzess 145 mcg helped a little but to 90 mcg caused diarrhea, she would like to try this medication again but she cannot afford it. Adequate fiber and fluid intake also recommended. Instructed about warning signs. I requested GI referral was placed.

## 2018-03-09 NOTE — Assessment & Plan Note (Signed)
Problem is not well controlled. Recommend resuming Prozac 20 mg daily. Strongly recommend keeping appointment with psychotherapist. We discussed some side effects of medication. Instructed about warning signs. Follow-up in 2 months.

## 2018-03-10 ENCOUNTER — Ambulatory Visit: Payer: Medicare HMO | Admitting: Psychology

## 2018-03-10 ENCOUNTER — Telehealth: Payer: Self-pay | Admitting: Family Medicine

## 2018-03-10 NOTE — Telephone Encounter (Signed)
Copied from [place] [id number]. Topic: Quick Communication - See Telephone Encounter >> Mar 10, 2018  5:36 PM Rutherford Nail, NT wrote: CRM for notification. See Telephone encounter for: 03/10/18. [employer]'s Prior Authorization department calling and states that the patient has started a PA for the montelukast (SINGULAIR) 10 MG tablet   and    LINZESS 145 MCG CAPS capsule. Would like to know if the patient has tried any generic to the medication? CB#: [phone]

## 2018-03-10 NOTE — Telephone Encounter (Signed)
Pt returned call. Informed of recommendation. Scheduled for lab draw 04/03/18

## 2018-03-11 ENCOUNTER — Ambulatory Visit: Payer: Medicare HMO | Admitting: Psychology

## 2018-03-11 NOTE — Telephone Encounter (Signed)
I called [employer] at the number below and spoke with Shawn.  She stated both medications were approved and I called [facility] and left a detailed message on the voicemail with this info.  Fax received from [place] stating the Singulair was approved and this was sent to be scanned.

## 2018-03-13 ENCOUNTER — Telehealth: Payer: Self-pay | Admitting: Family Medicine

## 2018-03-13 NOTE — Telephone Encounter (Signed)
Copied from [place] [id number]. Topic: Quick Communication - See Telephone Encounter >> Mar 13, 2018 10:12 AM Berneta Levins wrote: CRM for notification. See Telephone encounter for: 03/13/18.  Dr. Joneen Caraway at [facility] in [place] calling to see if Dr. Martinique would agree to schedule pt for a MRI.  Dr. Joneen Caraway is sending over a consent so that Dr. Martinique will be able to speak with her. Dr. Joneen Caraway can be reached at [phone]

## 2018-03-13 NOTE — Telephone Encounter (Signed)
Message sent to Dr. Jordan for review and approval. 

## 2018-03-16 ENCOUNTER — Emergency Department (HOSPITAL_COMMUNITY)
Admission: EM | Admit: 2018-03-16 | Discharge: 2018-03-18 | Disposition: A | Discharge status: Other Acute Inpatient Hospital | Payer: Medicare HMO | Attending: Emergency Medicine | Admitting: Emergency Medicine

## 2018-03-16 ENCOUNTER — Emergency Department (HOSPITAL_COMMUNITY): Payer: Medicare HMO

## 2018-03-16 ENCOUNTER — Encounter (HOSPITAL_COMMUNITY): Payer: Self-pay | Admitting: Emergency Medicine

## 2018-03-16 ENCOUNTER — Telehealth: Payer: Self-pay | Admitting: Family Medicine

## 2018-03-16 DIAGNOSIS — Z9114 Patient's other noncompliance with medication regimen: Secondary | ICD-10-CM | POA: Insufficient documentation

## 2018-03-16 DIAGNOSIS — F23 Brief psychotic disorder: Secondary | ICD-10-CM | POA: Insufficient documentation

## 2018-03-16 DIAGNOSIS — Z79899 Other long term (current) drug therapy: Secondary | ICD-10-CM | POA: Diagnosis not present

## 2018-03-16 DIAGNOSIS — Z8639 Personal history of other endocrine, nutritional and metabolic disease: Secondary | ICD-10-CM | POA: Insufficient documentation

## 2018-03-16 DIAGNOSIS — F419 Anxiety disorder, unspecified: Secondary | ICD-10-CM | POA: Insufficient documentation

## 2018-03-16 DIAGNOSIS — Z046 Encounter for general psychiatric examination, requested by authority: Secondary | ICD-10-CM | POA: Diagnosis not present

## 2018-03-16 DIAGNOSIS — R440 Auditory hallucinations: Secondary | ICD-10-CM | POA: Diagnosis present

## 2018-03-16 DIAGNOSIS — R451 Restlessness and agitation: Secondary | ICD-10-CM | POA: Insufficient documentation

## 2018-03-16 DIAGNOSIS — Z008 Encounter for other general examination: Secondary | ICD-10-CM

## 2018-03-16 LAB — COMPREHENSIVE METABOLIC PANEL
ALT: 15 U/L (ref 0–44)
AST: 23 U/L (ref 15–41)
Albumin: 3.7 g/dL (ref 3.5–5.0)
Alkaline Phosphatase: 47 U/L (ref 38–126)
Anion gap: 10 (ref 5–15)
BUN: 10 mg/dL (ref 8–23)
CO2: 23 mmol/L (ref 22–32)
Calcium: 9.2 mg/dL (ref 8.9–10.3)
Chloride: 108 mmol/L (ref 98–111)
Creatinine, Ser: 1.32 mg/dL — ABNORMAL HIGH (ref 0.44–1.00)
GFR calc Af Amer: 47 mL/min — ABNORMAL LOW (ref >60)
GFR calc non Af Amer: 41 mL/min — ABNORMAL LOW (ref >60)
Glucose, Bld: 115 mg/dL — ABNORMAL HIGH (ref 70–99)
Potassium: 3.4 mmol/L — ABNORMAL LOW (ref 3.5–5.1)
Sodium: 141 mmol/L (ref 135–145)
Total Bilirubin: 0.5 mg/dL (ref 0.3–1.2)
Total Protein: 6.6 g/dL (ref 6.5–8.1)

## 2018-03-16 LAB — CBC WITH DIFFERENTIAL/PLATELET
Abs Immature Granulocytes: 0.02 10*3/uL (ref 0.00–0.07)
Basophils Absolute: 0.1 10*3/uL (ref 0.0–0.1)
Basophils Relative: 1 %
Eosinophils Absolute: 0.2 10*3/uL (ref 0.0–0.5)
Eosinophils Relative: 3 %
HCT: 36.2 % (ref 36.0–46.0)
Hemoglobin: 11.6 g/dL — ABNORMAL LOW (ref 12.0–15.0)
Immature Granulocytes: 0 %
Lymphocytes Relative: 40 %
Lymphs Abs: 2.9 10*3/uL (ref 0.7–4.0)
MCH: 27.7 pg (ref 26.0–34.0)
MCHC: 32 g/dL (ref 30.0–36.0)
MCV: 86.4 fL (ref 80.0–100.0)
Monocytes Absolute: 0.6 10*3/uL (ref 0.1–1.0)
Monocytes Relative: 9 %
Neutro Abs: 3.4 10*3/uL (ref 1.7–7.7)
Neutrophils Relative %: 47 %
Platelets: 228 10*3/uL (ref 150–400)
RBC: 4.19 MIL/uL (ref 3.87–5.11)
RDW: 14.4 % (ref 11.5–15.5)
WBC: 7.2 10*3/uL (ref 4.0–10.5)
nRBC: 0 % (ref 0.0–0.2)

## 2018-03-16 LAB — ETHANOL: Alcohol, Ethyl (B): <10 mg/dL (ref <10)

## 2018-03-16 MED ORDER — HALOPERIDOL LACTATE 5 MG/ML IJ SOLN
5.0000 mg | Freq: Once | INTRAMUSCULAR | Status: AC
  Administered 2018-03-16: 5 mg via INTRAMUSCULAR
  Filled 2018-03-16: qty 1

## 2018-03-16 MED ORDER — LORAZEPAM 2 MG/ML IJ SOLN
1.0000 mg | Freq: Once | INTRAMUSCULAR | Status: AC | PRN
  Administered 2018-03-17: 1 mg via INTRAVENOUS
  Filled 2018-03-16: qty 1

## 2018-03-16 MED ORDER — LORAZEPAM 2 MG/ML IJ SOLN: 1.0000 mg | Freq: Once | INTRAMUSCULAR | Status: DC

## 2018-03-16 NOTE — Telephone Encounter (Signed)
Message sent to Dr.Jordan. 

## 2018-03-16 NOTE — Telephone Encounter (Signed)
Pt's daughter calling to report her mother's increased paranoia.  Daughter spoke to pt on phone and states pt thinks her house is contaminated and she is "Yelling at anyone who comes near her." Daughter is in [place]. States pt will not answer phone for triage. Also states pt was seen at [facility] and they recommended an MRI of the brain. States brother was to make appt with Dr. Martinique. Recommended EMS to check on pt; daughter declines stating "She won't let them near her and she won't go with them." States there is no neighbor to check on her. Instructed daughter to have her brother call [place] when he arrives home and is with patient for further assessment/triage.  Assured [place] would alert Dr. Martinique to issue.  Please advise:  Daughter Angela Nevin' # [phone]

## 2018-03-16 NOTE — ED Triage Notes (Signed)
Pt denies HI/SI.

## 2018-03-16 NOTE — ED Provider Notes (Signed)
73 year old female received at sign out from [facility] pending MRI and UA. Per her HPI:   "RAYNE COWDREY is a 73 y.o. female.  The history is provided by the patient, a relative and medical records. No language interpreter was used.   ESTIE SPROULE is a 73 y.o. female  with a PMH of pituitary adenoma which was resected about 2 years ago in [place] who presents to the Emergency Department with son for worsening hallucinations.  Son states that this is been an issue since the end of November, mostly occurring at night and easily redirected.  Over the last week, hallucinations have been much more intense, happening pretty much constantly and difficult to redirect and console.  She tells me that she is hearing voices that nobody else is hearing.  She has been very anxious.  Whenever family comes near of the last day or 2, she starts yelling to get away.  She has not been taking any of her medicines and when I asked her about this, she says " no medicines, no medicines." "    Physical Exam  BP (!) 121/56 (BP Location: Left Arm)   Pulse 66   Temp 97.7 F (36.5 C) (Oral)   Resp 20   LMP  (LMP Unknown)   SpO2 98%   Physical Exam  Resting comfortably, but easily arousable to voice.  No tachycardia, tachypnea, or increased work of breathing.  ED Course/Procedures     Procedures  MDM   73 year old female received a signout from [facility] ending MRI brain.  Please see her note for further work-up and medical decision making.  MRI brain is negative for acute pathology.  UA with moderate leukocytes.  Urine culture sent.  Will start the patient on Keflex for UTI. Pt medically cleared at this time. Psych hold orders and home med orders placed. TTS consult pending; please see psych team notes for further documentation of care/dispo. Pt stable at time of med clearance.        Joanne Gavel, PA-C 63/78/58 [id number]    Delora Fuel, MD 77/41/28 [phone]

## 2018-03-16 NOTE — ED Notes (Signed)
MRI called to inform MD that patient will need CT head before the MRI due to Hx: brain surgery and current hallucinations per their protocol. EDP made aware

## 2018-03-16 NOTE — ED Provider Notes (Signed)
[facility] DE[facility] Provider Note   CSN: [id number] Arrival date & time: 03/16/18  [place]     History   Chief Complaint Chief Complaint  Patient presents with  . Hallucinations    HPI Nancy Herman is a 73 y.o. female.  The history is provided by the patient, a relative and medical records. No language interpreter was used.   Nancy Herman is a 73 y.o. female  with a PMH of pituitary adenoma which was resected about 2 years ago in [place] who presents to the Emergency Department with son for worsening hallucinations.  Son states that this is been an issue since the end of November, mostly occurring at night and easily redirected.  Over the last week, hallucinations have been much more intense, happening pretty much constantly and difficult to redirect and console.  She tells me that she is hearing voices that nobody else is hearing.  She has been very anxious.  Whenever family comes near of the last day or 2, she starts yelling to get away.  She has not been taking any of her medicines and when I asked her about this, she says " no medicines, no medicines."     Past Medical History:  Diagnosis Date  . Allergy   . Anxiety   . Dyslipidemia   . Frequent headaches   . GERD (gastroesophageal reflux disease)   . Hyperlipidemia   . IBS (irritable bowel syndrome)   . 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 Thyroid disease   . Urine incontinence     Patient Active Problem List   Diagnosis Date Noted  . H/O urinary frequency 02/09/2018  . Cervicalgia 03/26/2017  . Dysphagia 12/04/2016  . Primary osteoarthritis of both knees 06/03/2016  . Depressive disorder 05/21/2016  . Cough 03/28/2016  . 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 GERD (gastroesophageal reflux disease) 11/06/2015  . IBS (irritable bowel syndrome) 10/16/2015  . Hyperlipidemia 10/16/2015  . Insomnia disorder 10/16/2015  . Anxiety disorder 10/16/2015  . Allergic rhinitis  10/16/2015  . Pituitary adenoma ([place]) 09/26/2015    Past Surgical History:  Procedure Laterality Date  . ABDOMINAL HYSTERECTOMY  1990  . BRAIN SURGERY    . BREAST EXCISIONAL BIOPSY Right unsure  . BREAST EXCISIONAL BIOPSY Left unsure  . OVARIAN CYST SURGERY  1994  . REPAIR RECTOCELE  2016   and prolapsed uterus.  . TONSILLECTOMY  1973     OB History   No obstetric history on file.      Home Medications    Prior to Admission medications   Medication Sig Start Date End Date Taking? Authorizing Provider  Calcium Carbonate-Vit D-Min (CALCIUM 1200 PO) Take 1 tablet by mouth daily after breakfast.     [provider]  FLUoxetine (PROZAC) 20 MG capsule Take 1 capsule (20 mg total) by mouth daily. 03/09/18   Martinique, Betty G, MD  LINZESS 145 MCG CAPS capsule Take 1 capsule (145 mcg total) by mouth daily before breakfast. 02/22/18   Petrucelli, Samantha R, PA-C  montelukast (SINGULAIR) 10 MG tablet TAKE 1 TABLET BY MOUTH AT BEDTIME Patient taking differently: Take 10 mg by mouth at bedtime.  12/08/17   Martinique, Betty G, MD  pantoprazole (PROTONIX) 40 MG tablet Take 1 tablet (40 mg total) by mouth daily. 03/09/18   Martinique, Betty G, MD  polyethylene glycol [facility]) packet Take 17 g by mouth daily. 02/22/18   Petrucelli, Samantha R, PA-C  pravastatin (PRAVACHOL) 20 MG tablet  Take 1 tablet (20 mg total) by mouth daily. 03/09/18   Martinique, Betty G, MD    Family History Family History  Problem Relation Age of Onset  . Heart disease Mother   . Alcohol abuse Mother   . Drug abuse Mother   . Heart disease Father   . Drug abuse Father   . Alcohol abuse Father     Social History Social History   Tobacco Use  . Smoking status: Never Smoker  . Smokeless tobacco: Never Used  Substance Use Topics  . Alcohol use: No  . Drug use: No     Allergies   Other   Review of Systems Review of Systems  Unable to perform ROS: Mental status change  Psychiatric/Behavioral: Positive for  hallucinations.     Physical Exam Updated Vital Signs BP 119/79 (BP Location: Right Arm)   Pulse 79   Temp 98.1 F (36.7 C) (Oral)   Resp 18   LMP  (LMP Unknown)   SpO2 97%   Physical Exam Vitals signs and nursing note reviewed.  Constitutional:      General: She is not in acute distress.    Appearance: She is well-developed.  HENT:     Head: Normocephalic and atraumatic.  Neck:     Musculoskeletal: Neck supple.  Cardiovascular:     Rate and Rhythm: Normal rate and regular rhythm.     Heart sounds: Normal heart sounds. No murmur.  Pulmonary:     Effort: Pulmonary effort is normal. No respiratory distress.     Breath sounds: Normal breath sounds.  Abdominal:     General: There is no distension.     Palpations: Abdomen is soft.     Tenderness: There is no abdominal tenderness.  Skin:    General: Skin is warm and dry.  Neurological:     Mental Status: She is alert and oriented to person, place, and time.  Psychiatric:        Attention and Perception: She perceives auditory hallucinations.        Behavior: Behavior is agitated.        Thought Content: Thought content does not include homicidal or suicidal ideation.      ED Treatments / Results  Labs (all labs ordered are listed, but only abnormal results are displayed) Labs Reviewed  COMPREHENSIVE METABOLIC PANEL - Abnormal; Notable for the following components:      Result Value   Potassium 3.4 (*)    Glucose, Bld 115 (*)    Creatinine, Ser 1.32 (*)    GFR calc non Af Amer 41 (*)    GFR calc Af Amer 47 (*)    All other components within normal limits  CBC WITH DIFFERENTIAL/PLATELET - Abnormal; Notable for the following components:   Hemoglobin 11.6 (*)    All other components within normal limits  ETHANOL  URINALYSIS, ROUTINE W REFLEX MICROSCOPIC  RAPID URINE DRUG SCREEN, HOSP PERFORMED    EKG None  Radiology Dg Chest 2 View  Result Date: 03/16/2018 CLINICAL DATA:  Psychiatric admission EXAM: CHEST  - 2 VIEW COMPARISON:  02/27/2018 FINDINGS: Cardiac shadow is at the upper limits of normal in size. The lungs are well aerated bilaterally without focal infiltrate or sizable effusion. Some right middle lobe atelectatic changes are noted new from the prior exam. No acute bony abnormality is noted. IMPRESSION: Right middle lobe atelectasis/ Electronically Signed   By: Inez Catalina M.D.   On: 03/16/2018 22:58    Procedures Procedures (including  critical care time)  Medications Ordered in ED Medications  LORazepam (ATIVAN) injection 1 mg (has no administration in time range)  haloperidol lactate (HALDOL) injection 5 mg (5 mg Intramuscular Given 03/16/18 2220)     Initial Impression / Assessment and Plan / ED Course  I have reviewed the triage vital signs and the nursing notes.  Pertinent labs & imaging results that were available during my care of the patient were reviewed by me and considered in my medical decision making (see chart for details).    Nancy Herman is a 73 y.o. female who presents to ED for worsening auditory hallucinations.  Per son, patient has no history of hallucinations until right after Thanksgiving.  She does endorse auditory hallucinations to me and appears quite anxious during my evaluation.  She was uncooperative and was given Haldol to help with her agitation in order to obtain proper screening labs and imaging.  Discussed this with son who agreed with this plan.  She has a history of prior pituitary adenoma resection 2 years ago.  She tried to be seen at [facility] and they informed her that they needed an MRI in order to properly medically clear her and attributed her hallucinations to mental health disorder.  I discussed her situation with our on-call neurologist, Dr. Malen Gauze, who does recommend getting an MRI with and without contrast in order to medically clear her given her history and new onset of hallucinations.   At shift change, a urinalysis pending as well as  MRI.  Care assumed by oncoming provider, PA McDonald who will follow up on urine and MRI.  If reassuring, will consult TTS for further recommendations regarding her hallucinations.  Patient seen by and discussed with Dr. Ralene Bathe who agrees with treatment plan.    Final Clinical Impressions(s) / ED Diagnoses   Final diagnoses:  Medical clearance for psychiatric admission    ED Discharge Orders    None       Wilsie Kern, Ozella Almond, PA-C 03/16/18 2331    Quintella Reichert, MD 03/17/18 [phone]

## 2018-03-16 NOTE — ED Triage Notes (Signed)
Pt BIB family for reports of auditory hallucinations and outbursts of anger. Pt cooperative in triage. States that she hears voices that mimic her daughters and tell her about the "homeowners association" family reports that she was seen by psychiatrist today but has not filled any prescriptions yet.

## 2018-03-17 ENCOUNTER — Other Ambulatory Visit: Payer: Self-pay

## 2018-03-17 ENCOUNTER — Emergency Department (HOSPITAL_COMMUNITY): Payer: Medicare HMO

## 2018-03-17 LAB — URINALYSIS, ROUTINE W REFLEX MICROSCOPIC
Bilirubin Urine: NEGATIVE
Glucose, UA: NEGATIVE mg/dL
Hgb urine dipstick: NEGATIVE
Ketones, ur: NEGATIVE mg/dL
Nitrite: NEGATIVE
Protein, ur: NEGATIVE mg/dL
Specific Gravity, Urine: 1.01 (ref 1.005–1.030)
pH: 6 (ref 5.0–8.0)

## 2018-03-17 LAB — RAPID URINE DRUG SCREEN, HOSP PERFORMED
Amphetamines: NOT DETECTED
Barbiturates: NOT DETECTED
Benzodiazepines: NOT DETECTED
Cocaine: NOT DETECTED
Opiates: NOT DETECTED
Tetrahydrocannabinol: NOT DETECTED

## 2018-03-17 MED ORDER — MONTELUKAST SODIUM 10 MG PO TABS
10.0000 mg | ORAL_TABLET | Freq: Every day | ORAL | Status: DC
  Administered 2018-03-17: 10 mg via ORAL
  Filled 2018-03-17: qty 1

## 2018-03-17 MED ORDER — LINACLOTIDE 145 MCG PO CAPS
145.0000 ug | ORAL_CAPSULE | Freq: Every day | ORAL | Status: DC
  Administered 2018-03-17 – 2018-03-18 (×2): 145 ug via ORAL
  Filled 2018-03-17 (×2): qty 1

## 2018-03-17 MED ORDER — ALUM & MAG HYDROXIDE-SIMETH 200-200-20 MG/5ML PO SUSP: 30.0000 mL | Freq: Four times a day (QID) | ORAL | Status: DC | PRN

## 2018-03-17 MED ORDER — ACETAMINOPHEN 325 MG PO TABS: 650.0000 mg | ORAL_TABLET | Freq: Four times a day (QID) | ORAL | Status: DC | PRN

## 2018-03-17 MED ORDER — PANTOPRAZOLE SODIUM 40 MG PO TBEC
40.0000 mg | DELAYED_RELEASE_TABLET | Freq: Every day | ORAL | Status: DC
  Administered 2018-03-17 – 2018-03-18 (×2): 40 mg via ORAL
  Filled 2018-03-17 (×2): qty 1

## 2018-03-17 MED ORDER — ONDANSETRON HCL 4 MG PO TABS: 4.0000 mg | ORAL_TABLET | Freq: Three times a day (TID) | ORAL | Status: DC | PRN

## 2018-03-17 MED ORDER — PRAVASTATIN SODIUM 10 MG PO TABS
20.0000 mg | ORAL_TABLET | Freq: Every day | ORAL | Status: DC
  Administered 2018-03-17 – 2018-03-18 (×2): 20 mg via ORAL
  Filled 2018-03-17 (×2): qty 2

## 2018-03-17 MED ORDER — POLYETHYLENE GLYCOL 3350 17 G PO PACK
17.0000 g | PACK | Freq: Every day | ORAL | Status: DC
  Administered 2018-03-17 – 2018-03-18 (×2): 17 g via ORAL
  Filled 2018-03-17 (×2): qty 1

## 2018-03-17 MED ORDER — CEPHALEXIN 250 MG PO CAPS
500.0000 mg | ORAL_CAPSULE | Freq: Two times a day (BID) | ORAL | Status: DC
  Administered 2018-03-17 – 2018-03-18 (×3): 500 mg via ORAL
  Filled 2018-03-17 (×3): qty 2

## 2018-03-17 MED ORDER — GADOBUTROL 1 MMOL/ML IV SOLN
6.0000 mL | Freq: Once | INTRAVENOUS | Status: AC | PRN
  Administered 2018-03-17: 6 mL via INTRAVENOUS

## 2018-03-17 NOTE — ED Notes (Signed)
Patient was given a snack and drink. 

## 2018-03-17 NOTE — ED Notes (Signed)
IVC papers served and faxed to [facility] and 3 copies placed in chart for transport; original placed on red folder and copy placed in medical records-Monique,RN

## 2018-03-17 NOTE — ED Notes (Signed)
Per [facility], patient's recommendation is inpatient general psych, and they will be looking for placement

## 2018-03-17 NOTE — Progress Notes (Signed)
Attempted TTS, pt asleep. Pt son, Aira Sallade [phone] present and could not get pt to stay awake either. Pt son requesting a call when TTS is completed. Nurse, Myriam Jacobson informed of pt being asleep.

## 2018-03-17 NOTE — ED Notes (Signed)
Family leaving at this time - son requested to be called when pt is transported and advised pt's sister may visit tomorrow prior to pt being transported.

## 2018-03-17 NOTE — ED Notes (Signed)
Dinner tray ordered.

## 2018-03-17 NOTE — ED Notes (Addendum)
Pt aware she has been accepted to [facility] - when asked pt if she was in agreement, asks "Do I have to go by myself? Where is my son?" Facility requesting for pt to be IVC'd d/t possibly may attempt to refuse to sign in. IVC papers given to Dr Francia Greaves.

## 2018-03-17 NOTE — ED Notes (Signed)
Son, [name], and grandson at bedside. Pt and family aware pt has been accepted to [facility], is being IVC'd d/t pt is unable to sign herself in, and Deputy will transport pt tomorrow (03/18/2018).

## 2018-03-17 NOTE — BHH Counselor (Signed)
  ASSESSMENT Disposition:   [facility] discussed case with [place] provider, Shuvon Rakin who recommended inpatient treatment (Gero-psych).  [facility] CSW will look for placement.  [facility] informed ER provider, Dr. Theora Gianotti  and ER nurse, Tawanna Solo, RN of the recommended disposition.  Oneita Allmon L. Wellman, [place], [facility], [facility] Therapeutic Triage Specialist  [phone]

## 2018-03-17 NOTE — ED Notes (Signed)
Pt arrived to Rm 49 via stretcher - Pt noted to be wearing hospital gown and IV to Right AC. Pt noted to be calm, cooperative.

## 2018-03-17 NOTE — ED Notes (Signed)
Patient transported to MRI 

## 2018-03-17 NOTE — BH Assessment (Signed)
Tele Assessment Note   Patient Name: DNIYAH GRANT MRN: [id number] Referring Physician: Dr. Delora Fuel Location of Patient: [facility] Location of Provider: [facility]  HARSHIKA MAGO is an 73 y.o. female who was brought to [facility] by family due to hallucination.  Pt states "I'm hearing voices telling me to clean up because my house is contaminated due to the rain.  [place] and the [facility] don't clean my apt like they clean the other apts." Pt reports feeling nervous and anxious about people coming into her home when she walk to the mailbox. Pt reports hearing voices since Christmas but they increased the past couple of weeks.  Pt denies SI/HI/SA.     Pt reports living home alone since July 2017 returning to [place], [place] when she left her daughter and granddaughter in [place].  Pt gave me verbal consent to speak with her son to gather additional information.  Patient was wearing a gown and appeared appropriately groomed.  Pt was alert throughout the assessment.  Patient made good eye contact and had normal psychomotor activity.  Patient spoke in a normal voice without pressured speech.  Pt expressed feeling nervous about her home.  Pt's affect appeared dysphoric and congruent with stated mood. Pt's thought process was coherent and logical.  Pt presented with fair insight and judgement.  Pt did not appear to be responding to internal stimuli.  Disposition: [place] discussed case with [place] provider, Shuvon Rakin who recommended inpatient treatment (Gero-psych).  CSW will look for placement.  [facility] informed ER provider, Dr. Theora Gianotti and ER nurse, Tawanna Solo, RN of the recommended disposition.  Family Collateral Toiya Morrish [phone]), son stated "mom stop taking her prozac along with her other medication when my sister left after Christmas.  She started getting  Paranoid about people coming in the house and things like that.  The other  day it got worse and she was yelling and hollering [facility]  Beverly Sessions think that the place on her brain where she had the tumor removed may have some swelling on it.  Her primary care provider is Dr. Betty Martinique and she will know everything you want to know."  Diagnosis: F23 Brief Psychotic Disorder  Past Medical History:  Past Medical History:  Diagnosis Date  . Allergy   . Anxiety   . Dyslipidemia   . Frequent headaches   . GERD (gastroesophageal reflux disease)   . Hyperlipidemia   . IBS (irritable bowel syndrome)   . 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 Thyroid disease   . Urine incontinence     Past Surgical History:  Procedure Laterality Date  . ABDOMINAL HYSTERECTOMY  1990  . BRAIN SURGERY    . BREAST EXCISIONAL BIOPSY Right unsure  . BREAST EXCISIONAL BIOPSY Left unsure  . OVARIAN CYST SURGERY  1994  . REPAIR RECTOCELE  2016   and prolapsed uterus.  . TONSILLECTOMY  1973    Family History:  Family History  Problem Relation Age of Onset  . Heart disease Mother   . Alcohol abuse Mother   . Drug abuse Mother   . Heart disease Father   . Drug abuse Father   . Alcohol abuse Father     Social History:  reports that she has never smoked. She has never used smokeless tobacco. She reports that she does not drink alcohol or use drugs.  Additional Social History:  Alcohol / Drug Use Pain Medications: See MARs Prescriptions: See MARs Over the Counter: See MARs History of alcohol / drug use?: No history of alcohol / drug abuse  CIWA: CIWA-Ar BP: 128/88 Pulse Rate: 80 COWS:    Allergies:  Allergies  Allergen Reactions  . Other     Environment     Home Medications: (Not in a hospital admission)   OB/GYN Status:  No LMP recorded (lmp unknown). Patient has had a hysterectomy.  General Assessment Data Assessment unable to be completed: Yes Reason for not completing assessment: Pt given Ativan. Pt asleep. Cannot arouse.  (Nurse, Myriam Jacobson informed. ) Location of Assessment: [facility] ED TTS Assessment: In system Is this a Tele or Face-to-Face Assessment?: Tele Assessment Is this an Initial Assessment or a Re-assessment for this encounter?: Initial Assessment Patient Accompanied by:: N/A Language Other than English: No What gender do you identify as?: Female Marital status: Widowed [place] name: [place] Pregnancy Status: No Living Arrangements: Alone Can pt return to current living arrangement?: Yes Admission Status: Voluntary Is patient capable of signing voluntary admission?: Yes Referral Source: Self/Family/Friend     Crisis Care Plan Living Arrangements: Alone  Education Status Is patient currently in school?: No Is the patient employed, unemployed or receiving disability?: Receiving disability income  Risk to self with the past 6 months Suicidal Ideation: No(pt denies) Has patient been a risk to self within the past 6 months prior to admission? : No(pt denies) Suicidal Intent: No Has patient had any suicidal intent within the past 6 months prior to admission? : No Is patient at risk for suicide?: No Suicidal Plan?: No Has patient had any suicidal plan within the past 6 months prior to admission? : No Access to Means: No Previous Attempts/Gestures: No Triggers for Past Attempts: None known Intentional Self Injurious Behavior: None Family Suicide History: No Recent stressful life event(s): Other (Comment)(weather causing fear of destruction of property) Persecutory voices/beliefs?: No Depression: No Depression Symptoms: Insomnia Substance abuse history and/or treatment for substance abuse?: No Suicide prevention information given to non-admitted patients: Not applicable  Risk to Others within the past 6 months Homicidal Ideation: No Does patient have any lifetime risk of violence toward others beyond the six months prior to admission? : No Thoughts of Harm to Others: No Current Homicidal  Intent: No Current Homicidal Plan: No Access to Homicidal Means: No History of harm to others?: No Assessment of Violence: None Noted Does patient have access to weapons?: No Criminal Charges Pending?: No Does patient have a court date: No Is patient on probation?: No  Psychosis Hallucinations: Auditory, With command(telling her to clean up) Delusions: Jealous(HOA not taking care of her apt)  Mental Status Report Appearance/Hygiene: Unremarkable, In scrubs Eye Contact: Good Motor Activity: Freedom of movement Speech: Logical/coherent Level of Consciousness: Alert, Quiet/awake Mood: Anxious, Suspicious Affect: Anxious, Preoccupied Anxiety Level: Moderate Thought Processes: Coherent, Relevant Judgement: Partial Orientation: Person, Place, Time, Appropriate for developmental age Obsessive Compulsive Thoughts/Behaviors: Minimal  Cognitive Functioning Concentration: Normal Memory: Recent Intact, Remote Intact Is patient IDD: No Insight: Poor Impulse Control: Poor Appetite: Fair Have you had any weight changes? : No Change Sleep: Decreased Total Hours of Sleep: 3 Vegetative Symptoms: None  ADLScreening [facility] Assessment Services) Patient's cognitive ability adequate to safely complete daily activities?: Yes Patient able to express need for assistance with ADLs?: Yes Independently performs ADLs?: Yes (appropriate for developmental age)  Prior Inpatient Therapy Prior Inpatient Therapy: No  Prior Outpatient Therapy Prior Outpatient Therapy: Yes Prior Therapy Dates:  03/2018 Does patient have an ACCT team?: No Does patient have Intensive In-House Services?  : No Does patient have [facility] services? : Yes Does patient have P4CC services?: No  ADL Screening (condition at time of admission) Patient's cognitive ability adequate to safely complete daily activities?: Yes Is the patient deaf or have difficulty hearing?: No Does the patient have difficulty seeing, even when  wearing glasses/contacts?: No Does the patient have difficulty concentrating, remembering, or making decisions?: No Patient able to express need for assistance with ADLs?: Yes Does the patient have difficulty dressing or bathing?: Yes Independently performs ADLs?: Yes (appropriate for developmental age) Does the patient have difficulty walking or climbing stairs?: No Weakness of Legs: None Weakness of Arms/Hands: None  Home Assistive Devices/Equipment Home Assistive Devices/Equipment: None    Abuse/Neglect Assessment (Assessment to be complete while patient is alone) Abuse/Neglect Assessment Can Be Completed: Yes Physical Abuse: Denies Verbal Abuse: Denies Sexual Abuse: Denies Exploitation of patient/patient's resources: Denies Self-Neglect: Denies Values / Beliefs Cultural Requests During Hospitalization: None Spiritual Requests During Hospitalization: None Consults Spiritual Care Consult Needed: No Social Work Consult Needed: No Regulatory affairs officer (For Healthcare) Does Patient Have a Medical Advance Directive?: No Would patient like information on creating a medical advance directive?: No - Patient declined          Disposition: [facility] discussed case with [place] provider, Shuvon Rakin who recommended inpatient treatment (Gero-psych).  CSW will look for placement.  [facility] informed ER provider, Dr. Theora Gianotti  and ER nurse, Sharyn Creamer, RN of the recommended disposition.   Disposition Initial Assessment Completed for this Encounter: Yes  This service was provided via telemedicine using a 2-way, interactive audio and video technology.  Names of all persons participating in this telemedicine service and their role in this encounter. Name: Raynelle Chary Role: Patient  Name: Idamae Lusher Role: Family Collateral  Name: Earleen Newport, NP Role: [facility] Provider  Name: Qiara Minetti L. Saia Derossett, [facility] Role: Triage Specialist    Sylvester Harder, MS, [facility], [facility] 03/17/2018 8:37 AM

## 2018-03-17 NOTE — ED Notes (Signed)
ALL belongings inventoried - 2 labeled belongings bags - Locker #6 and Valuables Envelope - Security.

## 2018-03-17 NOTE — ED Notes (Signed)
Deputy O'Brien w/[facility] aware of need for transport in AM. Took note - and requested for RN to call back around 0700 in AM.

## 2018-03-17 NOTE — Progress Notes (Signed)
Pt. meets criteria for inpatient treatment per Earleen Newport, NP.  No appropriate beds available at [facility]. Referred out to the following hospitals: [facility]  [facility]  [facility]  [facility]  [facility]     Disposition CSW will continue to follow for placement.  Areatha Keas. Judi Cong, MSW, [place] Disposition Clinical Social Work [phone] (cell) [phone] (office)

## 2018-03-17 NOTE — ED Notes (Signed)
Breakfast Tray Ordered. 

## 2018-03-17 NOTE — ED Notes (Signed)
Regular Diet was ordered for Lunch. 

## 2018-03-17 NOTE — ED Notes (Signed)
TTS attempted to do consult, pt unable to stay awake for assessment. Mia, PA made aware

## 2018-03-17 NOTE — Progress Notes (Signed)
Pt accepted to [facility] [place], [place] Dr. Gerrit Halls is the attending/accepting provider.   Call report to [phone] RN @ [facility] ED notified.    Pt is currently being involuntary committed an will be transported by law enforcement Completed IVC paperwork should be faxed to [place] at [phone] Pt may arrive when transportation is arranged and IVC paperwork is completed.   Audree Camel, LCSW, [place] Disposition CSW [phone]

## 2018-03-18 DIAGNOSIS — F29 Unspecified psychosis not due to a substance or known physiological condition: Secondary | ICD-10-CM | POA: Insufficient documentation

## 2018-03-18 LAB — URINE CULTURE: Culture: >=100000 — AB

## 2018-03-18 NOTE — Progress Notes (Signed)
Disposition CSW was contacted by ED CSW, Derek Mound., LCSWA, who related that patient's family was very upset about her acceptance at [facility], as it is 3 hours away from [place].  Patient was IVC'd yesterday as it was felt that she was not able to understand a voluntary consent.  CSW subsequently received a call from [facility] ED RN, Lowella Petties, who related that family asked that we try and find a placement that is closer.  CSW called and left a HIPAA compliant vm for patient's son, Avantika Shere.  CSW then called and reached patient's daughter, Emmamarie Kluender, who lives in [place].  I explained the dearth of gero-psych beds in the state and the need to place patient's as expediently as possible, rather than have them sit in the ED. Ms. Villegas explained that her brother is her mother's primary support and that he works 12 hour days 5 days a week, so it would be nearly impossible for him to visit, or monitor his mother's treatment.  Patient's daughter asked that we try again to place her mother closer to [facility] so that her brother could be a participant in her treatment.  CSW made no promises but agreed to contact [facility].  Refaxed information to both hospitals and requested that patient have an EKG and a Chest x-ray. CSW requested a copy of patient's IVC from ED.  Areatha Keas. Judi Cong, MSW, [place] Disposition Clinical Social Work [phone] (cell) [phone] (office)

## 2018-03-18 NOTE — ED Notes (Signed)
RN spoke with [place] who will pass message to SW to call  Patient's son in regards to placement and impending transfer this morning to [facility]; Pt's transfer is on temporary hold at this time til further instructions given by [facility]

## 2018-03-18 NOTE — ED Provider Notes (Signed)
Please see previous provider note for full H&P.  Patient is a 73 year old female presenting with auditory hallucinations. Patient continues to endorse intermittent auditory hallucinations. Patient denies any other complaints.  Physical Exam  BP 113/76 (BP Location: Right Arm)   Pulse 84   Temp 98 F (36.7 C) (Oral)   Resp 15   LMP  (LMP Unknown)   SpO2 98%   Physical Exam Patient is comfortable in bed in no acute distress.  ED Course/Procedures     Procedures  MDM   Patient is pending inpatient placement. CSW is working on placement.         Nancy Herman, [place] 03/18/18 [id number]    Hayden Rasmussen, MD 03/18/18 1800

## 2018-03-18 NOTE — ED Notes (Signed)
Son Nancy Herman expressed to RN that he and sister was not happy with placement for patient due to location; Nancy Herman asked for someone from [facility] call him and that patient not be transferred until he has been contacted to confirm if it's ok with him; RN contacted [facility] with information-Monique,RN

## 2018-03-18 NOTE — Telephone Encounter (Signed)
Try to contact Dr. Joneen Caraway, no answer. I left message in her voicemail. Ripken Rekowski Martinique, MD

## 2018-03-18 NOTE — ED Notes (Signed)
Patient's Son retrieved patient's valuables from Security holding house keys with patient's permission; pt signed belongings sheet stating Ok valuables to be released to [facility]

## 2018-03-18 NOTE — Progress Notes (Signed)
Pt accepted to [facility] Morrell Riddle, MD the accepting provider.  Call report to [phone] Emmy @ [facility] ED notified.   Pt is IVC.  Pt may be transported by [employer] Pt scheduled  to arrive at [facility] as soon as transport can be arranged.  Areatha Keas. Judi Cong, MSW, [place] Disposition Clinical Social Work [phone] (cell) [phone] (office)  CSW called and informed patient's daughter, Angela Nevin [phone]) and son, [name] [phone]).

## 2018-03-19 ENCOUNTER — Telehealth: Payer: Self-pay

## 2018-03-19 NOTE — Telephone Encounter (Signed)
No treatment for [facility] ED 03/18/2018 per Lenn Sink [facility]

## 2018-03-20 NOTE — Telephone Encounter (Signed)
Tried to contact Dr Reid,returning phone call in regard to need for MRI. I left a message.  Debria Broecker Martinique, MD

## 2018-03-23 ENCOUNTER — Other Ambulatory Visit: Payer: Medicare HMO

## 2018-03-24 NOTE — Telephone Encounter (Signed)
FYI Patient seen in ED on 03/16/2018 and had an MRI on 03/17/2018, according to chart.

## 2018-03-25 NOTE — Progress Notes (Deleted)
Cardiology Office Note   Date:  03/25/2018   ID:  Nancy, Herman Nov 18, 1945, MRN [id number]  PCP:  Martinique, Betty G, MD  Cardiologist:   No primary care provider on file. Referring:  Martinique, Betty G, MD  No chief complaint on file.     History of Present Illness: Nancy Herman is a 73 y.o. female who is referred by Martinique, Betty G, MD for evaluation of chest pain. ***   I do see that the patient had a negative perfusion study in 2017 in [place].  ***   Past Medical History:  Diagnosis Date  . Allergy   . Anxiety   . Dyslipidemia   . Frequent headaches   . GERD (gastroesophageal reflux disease)   . Hyperlipidemia   . IBS (irritable bowel syndrome)   . 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 Thyroid disease   . Urine incontinence     Past Surgical History:  Procedure Laterality Date  . ABDOMINAL HYSTERECTOMY  1990  . BRAIN SURGERY    . BREAST EXCISIONAL BIOPSY Right unsure  . BREAST EXCISIONAL BIOPSY Left unsure  . OVARIAN CYST SURGERY  1994  . REPAIR RECTOCELE  2016   and prolapsed uterus.  . TONSILLECTOMY  1973     Current Outpatient Medications  Medication Sig Dispense Refill  . Calcium Carbonate-Vit D-Min (CALCIUM 1200 PO) Take 1 tablet by mouth daily after breakfast.     . FLUoxetine (PROZAC) 20 MG capsule Take 1 capsule (20 mg total) by mouth daily. 30 capsule 2  . LINZESS 145 MCG CAPS capsule Take 1 capsule (145 mcg total) by mouth daily before breakfast. 30 capsule 2  . montelukast (SINGULAIR) 10 MG tablet TAKE 1 TABLET BY MOUTH AT BEDTIME (Patient taking differently: Take 10 mg by mouth at bedtime. ) 90 tablet 0  . pantoprazole (PROTONIX) 40 MG tablet Take 1 tablet (40 mg total) by mouth daily. 30 tablet 3  . polyethylene glycol (MIRALAX) packet Take 17 g by mouth daily. (Patient taking differently: Take 17 g by mouth every other day. ) 14 each 0  . pravastatin (PRAVACHOL) 20 MG tablet Take 1 tablet (20 mg total) by mouth daily. 90 tablet 1   No  current facility-administered medications for this visit.     Allergies:   Other    Social History:  The patient  reports that she has never smoked. She has never used smokeless tobacco. She reports that she does not drink alcohol or use drugs.   Family History:  The patient's ***family history includes Alcohol abuse in her father and mother; Drug abuse in her father and mother; Heart disease in her father and mother.    ROS:  Please see the history of present illness.   Otherwise, review of systems are positive for {NONE DEFAULTED:18576::"none"}.   All other systems are reviewed and negative.    PHYSICAL EXAM: VS:  LMP  (LMP Unknown)  , BMI There is no height or weight on file to calculate BMI. GENERAL:  Well appearing HEENT:  Pupils equal round and reactive, fundi not visualized, oral mucosa unremarkable NECK:  No jugular venous distention, waveform within normal limits, carotid upstroke brisk and symmetric, no bruits, no thyromegaly LYMPHATICS:  No cervical, inguinal adenopathy LUNGS:  Clear to auscultation bilaterally BACK:  No CVA tenderness CHEST:  Unremarkable HEART:  PMI not displaced or sustained,S1 and S2 within normal limits, no S3, no S4, no clicks, no rubs, *** murmurs ABD:  Flat,  positive bowel sounds normal in frequency in pitch, no bruits, no rebound, no guarding, no midline pulsatile mass, no hepatomegaly, no splenomegaly EXT:  2 plus pulses throughout, no edema, no cyanosis no clubbing SKIN:  No rashes no nodules NEURO:  Cranial nerves II through XII grossly intact, motor grossly intact throughout PSYCH:  Cognitively intact, oriented to person place and time    EKG:  EKG {ACTION; IS/IS YTR:[id number]} ordered today. The ekg ordered today demonstrates ***   Recent Labs: 03/04/2018: TSH 1.16 03/16/2018: ALT 15; BUN 10; Creatinine, Ser 1.32; Hemoglobin 11.6; Platelets 228; Potassium 3.4; Sodium 141    Lipid Panel    Component Value Date/Time   CHOL 216 (H)  12/08/2017 0858   TRIG 68.0 12/08/2017 0858   HDL 80.80 12/08/2017 0858   CHOLHDL 3 12/08/2017 0858   VLDL 13.6 12/08/2017 0858   LDLCALC 122 (H) 12/08/2017 0858      Wt Readings from Last 3 Encounters:  03/09/18 143 lb 3.2 oz (65 kg)  03/04/18 143 lb 12.8 oz (65.2 kg)  02/27/18 150 lb 6 oz (68.2 kg)      Other studies Reviewed: Additional studies/ records that were reviewed today include: ***. Review of the above records demonstrates:  Please see elsewhere in the note.  ***   ASSESSMENT AND PLAN:  ***   Current medicines are reviewed at length with the patient today.  The patient {ACTIONS; HAS/DOES NOT HAVE:19233} concerns regarding medicines.  The following changes have been made:  {PLAN; NO CHANGE:13088:s}  Labs/ tests ordered today include: *** No orders of the defined types were placed in this encounter.    Disposition:   FU with ***    Signed, Minus Breeding, MD  03/25/2018 8:33 PM    [facility]

## 2018-03-27 ENCOUNTER — Ambulatory Visit: Payer: Medicare HMO | Admitting: Cardiology

## 2018-03-31 ENCOUNTER — Encounter: Payer: Self-pay | Admitting: Family Medicine

## 2018-03-31 ENCOUNTER — Ambulatory Visit (INDEPENDENT_AMBULATORY_CARE_PROVIDER_SITE_OTHER): Payer: Medicare HMO | Admitting: Family Medicine

## 2018-03-31 VITALS — BP 130/72 | HR 61 | Temp 98.3°F | Resp 12 | Ht 63.0 in | Wt 143.1 lb

## 2018-03-31 DIAGNOSIS — F99 Mental disorder, not otherwise specified: Secondary | ICD-10-CM

## 2018-03-31 DIAGNOSIS — F323 Major depressive disorder, single episode, severe with psychotic features: Secondary | ICD-10-CM | POA: Insufficient documentation

## 2018-03-31 DIAGNOSIS — F5105 Insomnia due to other mental disorder: Secondary | ICD-10-CM | POA: Diagnosis not present

## 2018-03-31 DIAGNOSIS — R42 Dizziness and giddiness: Secondary | ICD-10-CM

## 2018-03-31 DIAGNOSIS — F411 Generalized anxiety disorder: Secondary | ICD-10-CM

## 2018-03-31 NOTE — Progress Notes (Signed)
HPI:   Nancy Herman is a 73 y.o. female, who is here today to follow on recent hospitalization.    She was admitted at [facility] on 03/18/18 and discharged on 03/26/18. She was evaluated in the ER on 03/16/18 because a week of hearing voices in her house,thinking that her house was contaminated and homeowners association was fixing other houses in her neighborhood but not hers, also she was hearing voices through the TV.  Last visit here on 03/09/18 ,she was with her son.She was not taken her Prozac consistently,she did not feel like she needed medications.She was recommended to continue Prozac.  She was committed. Discharged with Dx of psychosis,depression, and anxiety.  Currently she is on Trazodone 50 mg prn at bedtime,Prozac 20 mg daily, and Risperidone 0.25 mg bid. She is sleeping much better,she has needed Trazodone x 1.  She denies having visual or auditive hallucinations.  She denies suicidal thoughts. She ensures me she is taking medications as instructed to do so. C/O intermittent dizziness, exacerbated by getting up fast. Water sensation in right ear,deneis earache or hearing changes. She has not had falls.   Review of Systems  Constitutional: Negative for activity change, appetite change, fatigue and fever.  HENT: Negative for ear discharge, ear pain, mouth sores, nosebleeds and sore throat.   Eyes: Negative for redness and visual disturbance.  Respiratory: Negative for cough, shortness of breath and wheezing.   Cardiovascular: Negative for chest pain, palpitations and leg swelling.  Gastrointestinal: Positive for constipation. Negative for abdominal pain, nausea and vomiting.       Negative for changes in bowel habits.  Genitourinary: Negative for decreased urine volume and hematuria.  Neurological: Positive for light-headedness. Negative for syncope, weakness and headaches.  Psychiatric/Behavioral: Positive for sleep disturbance.  Negative for confusion, dysphoric mood and hallucinations. The patient is nervous/anxious.     Current Outpatient Medications on File Prior to Visit  Medication Sig Dispense Refill  . Calcium Carbonate-Vit D-Min (CALCIUM 1200 PO) Take 1 tablet by mouth daily after breakfast.     . FLUoxetine (PROZAC) 20 MG capsule Take 1 capsule (20 mg total) by mouth daily. 30 capsule 2  . LINZESS 145 MCG CAPS capsule Take 1 capsule (145 mcg total) by mouth daily before breakfast. 30 capsule 2  . montelukast (SINGULAIR) 10 MG tablet TAKE 1 TABLET BY MOUTH AT BEDTIME (Patient taking differently: Take 10 mg by mouth at bedtime. ) 90 tablet 0  . pantoprazole (PROTONIX) 40 MG tablet Take 1 tablet (40 mg total) by mouth daily. 30 tablet 3  . polyethylene glycol (MIRALAX) packet Take 17 g by mouth daily. (Patient taking differently: Take 17 g by mouth every other day. ) 14 each 0  . pravastatin (PRAVACHOL) 20 MG tablet Take by mouth.    . risperiDONE (RISPERDAL) 0.25 MG tablet Take 0.25 mg by mouth 2 (two) times daily.    . traZODone (DESYREL) 50 MG tablet TAKE 1 TABLET BY MOUTH AT BEDTIME AS NEEDED FOR SLEEP (FOR INSOMNIA)     No current facility-administered medications on file prior to visit.      Past Medical History:  Diagnosis Date  . Allergy   . Anxiety   . Dyslipidemia   . Frequent headaches   . GERD (gastroesophageal reflux disease)   . Hyperlipidemia   . IBS (irritable bowel syndrome)   . 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 Thyroid disease   . Urine incontinence    Allergies  Allergen Reactions  . Other Other (See Comments)    Outside stuff Other reaction(s): Other Outside stuff    Social History   Socioeconomic History  . Marital status: Single    Spouse name: Not on file  . Number of children: Not on file  . Years of education: Not on file  . Highest education level: Not on file  Occupational History  . Not on file  Social Needs  . Financial resource strain: Not on file  .  Food insecurity:    Worry: Not on file    Inability: Not on file  . Transportation needs:    Medical: Not on file    Non-medical: Not on file  Tobacco Use  . Smoking status: Never Smoker  . Smokeless tobacco: Never Used  Substance and Sexual Activity  . Alcohol use: No  . Drug use: No  . Sexual activity: Never  Lifestyle  . Physical activity:    Days per week: Not on file    Minutes per session: Not on file  . Stress: Not on file  Relationships  . Social connections:    Talks on phone: Not on file    Gets together: Not on file    Attends religious service: Not on file    Active member of club or organization: Not on file    Attends meetings of clubs or organizations: Not on file    Relationship status: Not on file  Other Topics Concern  . Not on file  Social History Narrative  . Not on file    Vitals:   03/31/18 1152  BP: 130/72  Pulse: 61  Resp: 12  Temp: 98.3 F (36.8 C)  SpO2: 99%   Body mass index is 25.35 kg/m.   Physical Exam  Nursing note and vitals reviewed. Constitutional: She is oriented to person, place, and time. She appears well-developed and well-nourished. No distress.  HENT:  Head: Normocephalic and atraumatic.  Mouth/Throat: Oropharynx is clear and moist and mucous membranes are normal.  Eyes: Pupils are equal, round, and reactive to light. Conjunctivae are normal.  Cardiovascular: Normal rate and regular rhythm.  No murmur heard. Respiratory: Effort normal and breath sounds normal. No respiratory distress.  GI: Soft. She exhibits no mass. There is no hepatomegaly. There is no abdominal tenderness.  Musculoskeletal:        General: No edema.  Lymphadenopathy:    She has no cervical adenopathy.  Neurological: She is alert and oriented to person, place, and time. She has normal strength. No cranial nerve deficit. Gait normal.  Skin: Skin is warm. No rash noted. No erythema.  Psychiatric: She has a normal mood and affect.  Well groomed,  good eye contact.    ASSESSMENT AND PLAN:  Ms. Merrit was seen today for hospitalization follow-up.  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 Continue Trazodone 50 mg at bedtime as needed. Good sleep hygiene.  Lightheadedness Possible etiologies discussed. Recommended avoiding rapid standing. Adequate hydration. Fall precautions discussed. Instructed about warning signs.  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 Keep appt with psyc on 3/9/202. Instructed about warning signs.  Generalized anxiety disorder Improved. No changes in Prozac. She is staying by herself at home but she tells me that her son is checking on her daily.    Return in about 5 months (around 08/29/2018) for f/u.  Daritza Brees G. Martinique, MD  [facility]. [place] office.

## 2018-03-31 NOTE — Patient Instructions (Signed)
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 Please continue same medications. Keep appointment with psychiatrist. I will see you back in 4 to 5 months, before if needed.  Please be sure medication list is accurate. If a new problem present, please set up appointment sooner than planned today.

## 2018-04-02 ENCOUNTER — Other Ambulatory Visit: Payer: Self-pay | Admitting: Family Medicine

## 2018-04-02 ENCOUNTER — Ambulatory Visit
Admission: RE | Admit: 2018-04-02 | Discharge: 2018-04-02 | Disposition: A | Discharge status: Home / Self Care | Payer: Medicare HMO | Source: Ambulatory Visit | Attending: Family Medicine | Admitting: Family Medicine

## 2018-04-02 ENCOUNTER — Other Ambulatory Visit: Payer: Self-pay | Admitting: Obstetrics & Gynecology

## 2018-04-02 DIAGNOSIS — N641 Fat necrosis of breast: Secondary | ICD-10-CM

## 2018-04-03 ENCOUNTER — Other Ambulatory Visit: Payer: Self-pay

## 2018-04-03 ENCOUNTER — Other Ambulatory Visit (INDEPENDENT_AMBULATORY_CARE_PROVIDER_SITE_OTHER): Payer: Medicare HMO

## 2018-04-03 ENCOUNTER — Telehealth: Payer: Self-pay | Admitting: Endocrinology

## 2018-04-03 DIAGNOSIS — E230 Hypopituitarism: Secondary | ICD-10-CM | POA: Diagnosis not present

## 2018-04-03 LAB — TSH: TSH: 3.8 u[IU]/mL (ref 0.35–4.50)

## 2018-04-03 LAB — T4, FREE: Free T4: 0.75 ng/dL (ref 0.60–1.60)

## 2018-04-03 NOTE — Telephone Encounter (Signed)
please contact patient: Normal results--good.  You can stay off the medication.  I'll see you next time.

## 2018-04-03 NOTE — Telephone Encounter (Signed)
Patient stated that she was informed to stop taking synthroid before labs. She has now had her lab work done and would to know if she can start taking it again.  Please Advise, Thanks Ph# [phone] or [phone]

## 2018-04-03 NOTE — Telephone Encounter (Signed)
Please advise. Labs have not yet resulted

## 2018-04-06 NOTE — Telephone Encounter (Signed)
Called pt and made her aware of Dr. Cordelia Pen instructions. Verbalized acceptance and understanding.

## 2018-04-13 ENCOUNTER — Ambulatory Visit (INDEPENDENT_AMBULATORY_CARE_PROVIDER_SITE_OTHER): Payer: Medicare HMO | Admitting: Psychology

## 2018-04-13 DIAGNOSIS — F411 Generalized anxiety disorder: Secondary | ICD-10-CM

## 2018-04-18 ENCOUNTER — Other Ambulatory Visit: Payer: Self-pay | Admitting: Family Medicine

## 2018-04-18 DIAGNOSIS — J309 Allergic rhinitis, unspecified: Secondary | ICD-10-CM

## 2018-04-26 ENCOUNTER — Telehealth: Payer: Self-pay | Admitting: Family Medicine

## 2018-04-26 MED ORDER — RISPERIDONE 0.25 MG PO TABS: 0.2500 mg | ORAL_TABLET | Freq: Two times a day (BID) | ORAL | 0 refills | Status: DC

## 2018-04-26 NOTE — Telephone Encounter (Signed)
On call physician received call re: patient needing refill of Risperdal. Sent in one month. Will notify PCP. Briscoe Deutscher, DO 04/26/2018

## 2018-04-27 NOTE — Telephone Encounter (Signed)
Nancy Herman follows with psychiatrist, established after psychiatric hospitalization. Please advise her to call her psychiatrist's office.  Thanks, BJ

## 2018-04-30 ENCOUNTER — Ambulatory Visit: Payer: Medicare HMO | Admitting: Psychology

## 2018-05-01 ENCOUNTER — Encounter: Payer: Self-pay | Admitting: *Deleted

## 2018-05-01 NOTE — Telephone Encounter (Signed)
Patient informed. 

## 2018-05-04 ENCOUNTER — Telehealth: Payer: Self-pay

## 2018-05-04 NOTE — Telephone Encounter (Signed)
Virtual Visit Pre-Appointment Phone Call  Steps For Call:  1. Confirm consent - "In the setting of the current Covid19 crisis, you are scheduled for a (phone or video) visit with your provider on (date) at (time).  Just as we do with many in-office visits, in order for you to participate in this visit, we must obtain consent.  If you'd like, I can send this to your mychart (if signed up) or email for you to review.  Otherwise, I can obtain your verbal consent now.  All virtual visits are billed to your insurance company just like a normal visit would be.  By agreeing to a virtual visit, we'd like you to understand that the technology does not allow for your provider to perform an examination, and thus may limit your provider's ability to fully assess your condition.  Finally, though the technology is pretty good, we cannot assure that it will always work on either your or our end, and in the setting of a video visit, we may have to convert it to a phone-only visit.  In either situation, we cannot ensure that we have a secure connection.  Are you willing to proceed?"  2. Give patient instructions for WebEx download to smartphone as below if video visit  3. Advise patient to be prepared with any vital sign or heart rhythm information, their current medicines, and a piece of paper and pen handy for any instructions they may receive the day of their visit  4. Inform patient they will receive a phone call 15 minutes prior to their appointment time (may be from unknown caller ID) so they should be prepared to answer  5. Confirm that appointment type is correct in Epic appointment notes (video vs telephone)    TELEPHONE CALL NOTE  Nancy Herman has been deemed a candidate for a follow-up tele-health visit to limit community exposure during the Covid-19 pandemic. I spoke with the patient via phone to ensure availability of phone/video source, confirm preferred email & phone number, and discuss  instructions and expectations.  I reminded Nancy Herman to be prepared with any vital sign and/or heart rhythm information that could potentially be obtained via home monitoring, at the time of her visit. I reminded Nancy Herman to expect a phone call at the time of her visit if her visit.  Did the patient verbally acknowledge consent to treatment? Patient gave verbal consent.   Mitzie Na, [place] 05/04/2018 5:37 PM   DOWNLOADING THE [place], go to [employer] and type in WebEx in the search bar. [place] [employer], the blue/green circle. The app is free but as with any other app downloads, their phone may require them to verify saved payment information or Apple password. The patient does NOT have to create an account.  - If Android, ask patient to go to [employer] and type in WebEx in the search bar. [place] [employer], the blue/green circle. The app is free but as with any other app downloads, their phone may require them to verify saved payment information or Android password. The patient does NOT have to create an account.   CONSENT FOR TELE-HEALTH VISIT - PLEASE REVIEW  I hereby voluntarily request, consent and authorize [facility] and its employed or contracted physicians, physician assistants, nurse practitioners or other licensed health care professionals (the Practitioner), to provide me with telemedicine health care services (the "Services") as deemed necessary  by the treating Practitioner. I acknowledge and consent to receive the Services by the Practitioner via telemedicine. I understand that the telemedicine visit will involve communicating with the Practitioner through live audiovisual communication technology and the disclosure of certain medical information by electronic transmission. I acknowledge that I have been given the opportunity to request an in-person assessment or other available alternative  prior to the telemedicine visit and am voluntarily participating in the telemedicine visit.  I understand that I have the right to withhold or withdraw my consent to the use of telemedicine in the course of my care at any time, without affecting my right to future care or treatment, and that the Practitioner or I may terminate the telemedicine visit at any time. I understand that I have the right to inspect all information obtained and/or recorded in the course of the telemedicine visit and may receive copies of available information for a reasonable fee.  I understand that some of the potential risks of receiving the Services via telemedicine include:  Marland Kitchen Delay or interruption in medical evaluation due to technological equipment failure or disruption; . Information transmitted may not be sufficient (e.g. poor resolution of images) to allow for appropriate medical decision making by the Practitioner; and/or  . In rare instances, security protocols could fail, causing a breach of personal health information.  Furthermore, I acknowledge that it is my responsibility to provide information about my medical history, conditions and care that is complete and accurate to the best of my ability. I acknowledge that Practitioner's advice, recommendations, and/or decision may be based on factors not within their control, such as incomplete or inaccurate data provided by me or distortions of diagnostic images or specimens that may result from electronic transmissions. I understand that the practice of medicine is not an exact science and that Practitioner makes no warranties or guarantees regarding treatment outcomes. I acknowledge that I will receive a copy of this consent concurrently upon execution via email to the email address I last provided but may also request a printed copy by calling the office of [place].    I understand that my insurance will be billed for this visit.   I have read or had this  consent read to me. . I understand the contents of this consent, which adequately explains the benefits and risks of the Services being provided via telemedicine.  . I have been provided ample opportunity to ask questions regarding this consent and the Services and have had my questions answered to my satisfaction. . I give my informed consent for the services to be provided through the use of telemedicine in my medical care  By participating in this telemedicine visit I agree to the above.

## 2018-05-05 ENCOUNTER — Encounter: Payer: Self-pay | Admitting: Cardiology

## 2018-05-05 ENCOUNTER — Telehealth (INDEPENDENT_AMBULATORY_CARE_PROVIDER_SITE_OTHER): Payer: Medicare HMO | Admitting: Cardiology

## 2018-05-05 VITALS — Ht 63.0 in | Wt 142.0 lb

## 2018-05-05 DIAGNOSIS — E785 Hyperlipidemia, unspecified: Secondary | ICD-10-CM | POA: Diagnosis not present

## 2018-05-05 DIAGNOSIS — R072 Precordial pain: Secondary | ICD-10-CM

## 2018-05-05 DIAGNOSIS — R0789 Other chest pain: Secondary | ICD-10-CM | POA: Diagnosis not present

## 2018-05-05 NOTE — Progress Notes (Signed)
Virtual Visit via Telephone Note    Evaluation Performed:  Follow-up visit  This visit type was conducted due to national recommendations for restrictions regarding the COVID-19 Pandemic (e.g. social distancing).  This format is felt to be most appropriate for this patient at this time.  All issues noted in this document were discussed and addressed.  No physical exam was performed (except for noted visual exam findings with Video Visits).  Please refer to the patient's chart (MyChart message for video visits and phone note for telephone visits) for the patient's consent to telehealth for [facility].  Date:  05/05/2018   ID:  Nancy Herman, DOB 1945-12-18, MRN [id number]  Patient Location:  Home   Provider location:   [place], [place]  PCP:  Martinique, Betty G, MD  Cardiologist:  Minus Breeding, MD  Electrophysiologist:  None   Chief Complaint:  Chest pain  History of Present Illness:    Nancy Herman is a 73 y.o. female who presents via audio/video conferencing for a telehealth visit today.    The patient was referred by Dr. Martinique for evaluation of chest pain.  She has no past cardiac history.  I do see that she has had a negative perfusions study below in 2017.    The patient was in the emergency room in January.  I reviewed these records.  She had some chest discomfort that was thought to be atypical.  She had negative troponins.  She was in the emergency room for unrelated reasons in February and I reviewed these records.  She had an EKG at that time as described below.  There was no evidence of ischemia or infarct.  She said that she was getting discomfort that was on the right side.  It was a tightness that was 6 out of 10 in intensity.  She noticed that after she ate something and would lie down.  She stopped doing that.  The symptoms seem to have gone away.  She still has a sensation occasionally like her food is getting stuck or pills getting stuck.  She has a  little bit of discomfort when she has the tightness in her chest and radiates up into her jaw but again this goes away if she watches what she eats and her position afterwards.  She is able to be active and do her household chores without bringing on any chest pressure, neck or arm discomfort.  She does not describe dyspneic symptoms such as nausea vomiting or diaphoresis.  She is not had any palpitations, presyncope or syncope.  The patient does not symptoms concerning for COVID-19 infection (fever, chills, cough, or new shortness of breath).    Prior CV studies:   The following studies were reviewed today:  Negative perfusion study 2017.   EKG: Sinus rhythm, rate 62, axis within normal limits, intervals within normal limits, no acute ST-T wave changes.  03/18/2018  Past Medical History:  Diagnosis Date  . Anxiety   . Frequent headaches   . GERD (gastroesophageal reflux disease)   . Hyperlipidemia   . IBS (irritable bowel syndrome)   . 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 Thyroid disease    Past Surgical History:  Procedure Laterality Date  . ABDOMINAL HYSTERECTOMY  1990  . BRAIN SURGERY    . BREAST EXCISIONAL BIOPSY Right unsure  . BREAST EXCISIONAL BIOPSY Left unsure  . OVARIAN CYST SURGERY  1994  . REPAIR RECTOCELE  2016   and prolapsed uterus.  . TONSILLECTOMY  1973    Prior to Admission medications   Medication Sig Start Date End Date Taking? Authorizing Provider  Calcium Carbonate-Vit D-Min (CALCIUM 1200 PO) Take 1 tablet by mouth daily after breakfast.    Yes [provider]  FLUoxetine (PROZAC) 20 MG capsule Take 1 capsule (20 mg total) by mouth daily. 03/09/18  Yes Martinique, Betty G, MD  montelukast (SINGULAIR) 10 MG tablet TAKE 1 TABLET BY MOUTH AT BEDTIME 04/20/18  Yes Martinique, Betty G, MD  pantoprazole (PROTONIX) 40 MG tablet Take 1 tablet (40 mg total) by mouth daily. 03/09/18  Yes Martinique, Betty G, MD  polyethylene glycol [facility]) packet Take 17 g by mouth daily.  Patient taking differently: Take 17 g by mouth every other day.  02/22/18  Yes Petrucelli, Samantha R, PA-C  pravastatin (PRAVACHOL) 20 MG tablet Take by mouth. 03/26/18  Yes [provider]  risperiDONE (RISPERDAL) 0.25 MG tablet Take 1 tablet (0.25 mg total) by mouth 2 (two) times daily. 04/26/18  Yes Briscoe Deutscher, DO  traZODone (DESYREL) 50 MG tablet TAKE 1 TABLET BY MOUTH AT BEDTIME AS NEEDED FOR SLEEP (FOR INSOMNIA) 03/26/18  Yes [provider]     Allergies:   Other   Social History   Tobacco Use  . Smoking status: Never Smoker  . Smokeless tobacco: Never Used  Substance Use Topics  . Alcohol use: No  . Drug use: No     Family Hx: The patient's family history includes Alcohol abuse in her father and mother; Drug abuse in her father and mother; Heart disease in her father and mother; Sickle cell anemia in her mother.  ROS:   Please see the history of present illness.    As stated in the HPI and negative for all other systems.   Labs/Other Tests and Data Reviewed:    Recent Labs: 03/16/2018: ALT 15; BUN 10; Creatinine, Ser 1.32; Hemoglobin 11.6; Platelets 228; Potassium 3.4; Sodium 141 04/03/2018: TSH 3.80   Recent Lipid Panel Lab Results  Component Value Date/Time   CHOL 216 (H) 12/08/2017 08:58 AM   TRIG 68.0 12/08/2017 08:58 AM   HDL 80.80 12/08/2017 08:58 AM   CHOLHDL 3 12/08/2017 08:58 AM   LDLCALC 122 (H) 12/08/2017 08:58 AM    Wt Readings from Last 3 Encounters:  05/05/18 142 lb (64.4 kg)  03/31/18 143 lb 2 oz (64.9 kg)  03/09/18 143 lb 3.2 oz (65 kg)     Objective:    Vital Signs:  Ht 5\' 3"  (1.6 m)   Wt 142 lb (64.4 kg)   LMP  (LMP Unknown)   BMI 25.15 kg/m     ASSESSMENT & PLAN:    Chest pain: Her chest pain is atypical.  She had a negative perfusion study less than 3 years ago.  Her symptoms gone away since she changed her habits.  She does not have significant cardiovascular risk factors.  She had a normal EKG previously and  recently.  She had an ER visit which I evaluated.  At this point I think the pretest probability of obstructive coronary disease is low.  I do not think that further testing is suggested.  Given her ongoing symptoms of feeling like pills get stuck she should have a GI evaluation electively.  Dyslipidemia: I reviewed her labs with her as above.  I calculated her Framingham risk score which was 8.6%.  Not unreasonable for her to continue the low-dose Pravachol and I would suggest even going up to a higher dose  but she would prefer to continue the lower dose.   COVID-19 Education: The signs and symptoms of COVID-19 were discussed with the patient and how to seek care for testing (follow up with PCP or arrange E-visit).  The importance of social distancing was discussed today.  Patient Risk:   After full review of this patient's clinical status, I feel that they are at least moderate risk at this time.  Time:   Today, I have spent 25 minutes with the patient with telehealth technology discussing chest pain and Covid symptoms and avoidance.     Medication Adjustments/Labs and Tests Ordered: Current medicines are reviewed at length with the patient today.  Concerns regarding medicines are outlined above.  Tests Ordered: No orders of the defined types were placed in this encounter.  Medication Changes: No orders of the defined types were placed in this encounter.   Disposition:  Follow up prn  Signed, Minus Breeding, MD  05/05/2018 9:41 AM    [place]

## 2018-05-05 NOTE — Patient Instructions (Signed)
Medication Instructions:  Continue all medications  If you need a refill on your cardiac medications before your next appointment, please call your pharmacy.  Labwork: None Ordered   Testing/Procedures: None Ordered   Follow-Up: . Your physician recommends that you schedule a follow-up appointment in: As Needed   At [facility], you and your health needs are our priority.  As part of our continuing mission to provide you with exceptional heart care, we have created designated Provider Care Teams.  These Care Teams include your primary Cardiologist (physician) and Advanced Practice Providers (APPs -  Physician Assistants and Nurse Practitioners) who all work together to provide you with the care you need, when you need it.  Thank you for choosing [facility] at [facility]!!

## 2018-05-08 ENCOUNTER — Ambulatory Visit: Payer: Self-pay | Admitting: Family Medicine

## 2018-05-08 ENCOUNTER — Ambulatory Visit: Payer: Medicare HMO | Admitting: Cardiology

## 2018-05-15 ENCOUNTER — Ambulatory Visit (HOSPITAL_COMMUNITY): Payer: Medicare HMO | Admitting: Psychiatry

## 2018-05-19 ENCOUNTER — Encounter (HOSPITAL_COMMUNITY): Payer: Self-pay | Admitting: Psychiatry

## 2018-05-19 ENCOUNTER — Telehealth: Payer: Self-pay | Admitting: Gastroenterology

## 2018-05-19 ENCOUNTER — Ambulatory Visit (INDEPENDENT_AMBULATORY_CARE_PROVIDER_SITE_OTHER): Payer: Medicare HMO | Admitting: Psychiatry

## 2018-05-19 ENCOUNTER — Other Ambulatory Visit: Payer: Self-pay

## 2018-05-19 VITALS — BP 115/68 | HR 70 | Temp 98.7°F | Ht 63.0 in | Wt 146.0 lb

## 2018-05-19 DIAGNOSIS — F411 Generalized anxiety disorder: Secondary | ICD-10-CM

## 2018-05-19 DIAGNOSIS — F329 Major depressive disorder, single episode, unspecified: Secondary | ICD-10-CM

## 2018-05-19 DIAGNOSIS — F322 Major depressive disorder, single episode, severe without psychotic features: Secondary | ICD-10-CM | POA: Diagnosis not present

## 2018-05-19 DIAGNOSIS — F32A Depression, unspecified: Secondary | ICD-10-CM

## 2018-05-19 MED ORDER — RISPERIDONE 0.25 MG PO TABS: 0.2500 mg | ORAL_TABLET | Freq: Two times a day (BID) | ORAL | 5 refills | Status: DC

## 2018-05-19 MED ORDER — FLUOXETINE HCL 20 MG PO CAPS: 20.0000 mg | ORAL_CAPSULE | Freq: Every day | ORAL | 5 refills | Status: DC

## 2018-05-19 MED ORDER — TRAZODONE HCL 50 MG PO TABS: 50.0000 mg | ORAL_TABLET | Freq: Every day | ORAL | 5 refills | Status: DC

## 2018-05-19 NOTE — Telephone Encounter (Signed)
Dr. Silverio Decamp, pt requested you as a doctor.  There is a referral for patient for IBS.  Pt's records from [facility] will be placed on your desk for review.

## 2018-05-19 NOTE — Progress Notes (Signed)
Psychiatric Initial Adult Assessment   Patient Identification: Nancy Herman MRN:  [id number] Date of Evaluation:  05/19/2018 Referral Source: Dr. Betty Martinique Chief Complaint:   Visit Diagnosis:    ICD-10-CM   1. Depressive disorder F32.9 FLUoxetine (PROZAC) 20 MG capsule  2. Generalized anxiety disorder F41.1 FLUoxetine (PROZAC) 20 MG capsule    History of Present Illness:    This patient is a 73 year old widow mother recently was psychiatrically hospitalized in [facility] for a psychotic depression.  Her actual hospital records have yet to be evaluated.  The patient in February experienced significant auditory hallucinations, depression and failure to function.  She had significant paranoia as well.  She believes that homeowners association was intruding into her home.  She therefore threw a number of her belongings away feeling that someway they were poisoned by his homeowners association.  The patient was hospitalized approximately a week and discharged on Risperdal Prozac and trazodone.  At this time the patient is actually doing well.  She did see if she was very distressed at this time has no hallucinations.  She is much less anxious.  The patient been a widow over 10 years and she lives alone.  At this time she is physically well.  She denies fevers chills cough or shortness of breath.  The patient has 2 children [name] and [place].  [name] has 4 children and financially is not doing well.  He has a temporary job.  [place] is very stable.  The patient does not work.  In retrospect the patient says she was very distressed by the way home monitors company was dealing with her.  He was in regard to regarding not getting to regarding return in her backyard. At this time the patient denies daily depression.  She is sleeping and eating well.  She is got good energy.  She has some minor problems her ability to think and concentrate but it does not affect her functioning.  She  denies a sense of worthlessness.  She is not suicidal at this time in fact she is never been suicidal.  She enjoys reading watching TV and is looking forward to owning a pack.  She likes gospel music.  The patient denies use of alcohol or drugs.  She does remember clear episode of being psychotic in the past.  She not quite sure if she is ever had clinical depression in the past.  She is not really ever had mania.  She denies symptoms of generalized anxiety disorder panic disorder or obsessive-compulsive disorder.  The patient grew up in [place].  Her parents separated and she was a teenager.  She had one brother.  The patient has a high school education.  When growing up she never had any experiences of abuse.  Her medical history is significant for a past history of a brain tumor.  This is in 2017 and she believes it was related to her pituitary.  Patient also has irritable bowel and gastroesophageal reflux disease. Her past psychiatric history is significant for not having a past psychiatric hospital before that hospital admission this February.  Presently she is actually in therapy with a Dr. Vevelyn Francois.  At this time the patient takes Prozac 20 mg, risperidone 0.25 mg twice daily trazodone 50 mg.  She claims she is been on multiple other psychiatric medicines in the past but cannot remember them.  At this time the patient feels like she is stable and functioning well.  Associated Signs/Symptoms: Depression Symptoms:  difficulty concentrating, (Hypo) Manic Symptoms:   Anxiety Symptoms:   Psychotic Symptoms:   PTSD Symptoms:   Past Psychiatric History: Prozac for many years  Previous Psychotropic Medications:   Substance Abuse History in the last 12 months:  No.  Consequences of Substance Abuse:   Past Medical History:  Past Medical History:  Diagnosis Date  . Anxiety   . Frequent headaches   . GERD (gastroesophageal reflux disease)   . Hyperlipidemia   . IBS (irritable bowel  syndrome)   . 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 Thyroid disease     Past Surgical History:  Procedure Laterality Date  . ABDOMINAL HYSTERECTOMY  1990  . BRAIN SURGERY    . BREAST EXCISIONAL BIOPSY Right unsure  . BREAST EXCISIONAL BIOPSY Left unsure  . OVARIAN CYST SURGERY  1994  . REPAIR RECTOCELE  2016   and prolapsed uterus.  . TONSILLECTOMY  1973    Family Psychiatric History:   Family History:  Family History  Problem Relation Age of Onset  . Heart disease Mother        No details   . Alcohol abuse Mother   . Drug abuse Mother   . Sickle cell anemia Mother   . Heart disease Father        No details . Questionable bypass or stent  . Drug abuse Father   . Alcohol abuse Father     Social History:   Social History   Socioeconomic History  . Marital status: Single    Spouse name: Not on file  . Number of children: Not on file  . Years of education: Not on file  . Highest education level: Not on file  Occupational History  . Not on file  Social Needs  . Financial resource strain: Not on file  . Food insecurity:    Worry: Not on file    Inability: Not on file  . Transportation needs:    Medical: Not on file    Non-medical: Not on file  Tobacco Use  . Smoking status: Never Smoker  . Smokeless tobacco: Never Used  Substance and Sexual Activity  . Alcohol use: No  . Drug use: No  . Sexual activity: Never  Lifestyle  . Physical activity:    Days per week: Not on file    Minutes per session: Not on file  . Stress: Not on file  Relationships  . Social connections:    Talks on phone: Not on file    Gets together: Not on file    Attends religious service: Not on file    Active member of club or organization: Not on file    Attends meetings of clubs or organizations: Not on file    Relationship status: Not on file  Other Topics Concern  . Not on file  Social History Narrative   Lives alone.  Lives near son.  Was in [place].    Additional Social History:    Allergies:   Allergies  Allergen Reactions  . Other Other (See Comments)    Outside stuff Other reaction(s): Other Outside stuff    Metabolic Disorder Labs: Lab Results  Component Value Date   HGBA1C 5.8 12/08/2017   Lab Results  Component Value Date   PROLACTIN 5.0 03/04/2018   PROLACTIN 8.8 09/26/2015   Lab Results  Component Value Date   CHOL 216 (H) 12/08/2017   TRIG 68.0 12/08/2017   HDL 80.80 12/08/2017   CHOLHDL 3 12/08/2017   VLDL 13.6  12/08/2017   LDLCALC 122 (H) 12/08/2017   LDLCALC 136 (H) 09/30/2016   Lab Results  Component Value Date   TSH 3.80 04/03/2018    Therapeutic Level Labs: No results found for: LITHIUM No results found for: CBMZ No results found for: VALPROATE  Current Medications: Current Outpatient Medications  Medication Sig Dispense Refill  . Calcium Carbonate-Vit D-Min (CALCIUM 1200 PO) Take 1 tablet by mouth daily after breakfast.     . FLUoxetine (PROZAC) 20 MG capsule Take 1 capsule (20 mg total) by mouth daily. 30 capsule 5  . montelukast (SINGULAIR) 10 MG tablet TAKE 1 TABLET BY MOUTH AT BEDTIME 90 tablet 0  . pantoprazole (PROTONIX) 40 MG tablet Take 1 tablet (40 mg total) by mouth daily. 30 tablet 3  . polyethylene glycol (MIRALAX) packet Take 17 g by mouth daily. (Patient taking differently: Take 17 g by mouth every other day. ) 14 each 0  . pravastatin (PRAVACHOL) 20 MG tablet Take by mouth.    . risperiDONE (RISPERDAL) 0.25 MG tablet Take 1 tablet (0.25 mg total) by mouth 2 (two) times daily. 60 tablet 5  . traZODone (DESYREL) 50 MG tablet Take 1 tablet (50 mg total) by mouth at bedtime. 30 tablet 5   No current facility-administered medications for this visit.     Musculoskeletal: Strength & Muscle Tone: within normal limits Gait & Station: normal Patient leans:   Psychiatric Specialty Exam: ROS  Blood pressure 115/68, pulse 70, temperature 98.7 F (37.1 C), height 5\' 3"  (1.6 m), weight 146 lb (66.2 kg).Body mass  index is 25.86 kg/m.  General Appearance: Casual  Eye Contact:  Good  Speech: Good  Volume:  Normal  Mood:  NA  Affect:  NA and Appropriate  Thought Process:  Coherent  Orientation:  Full (Time, Place, and Person)  Thought Content:  Logical  Suicidal Thoughts:  No  Homicidal Thoughts:  No  Memory:  Negative  Judgement:  Good  Insight:  Fair  Psychomotor Activity:  Normal  Concentration:    Recall:  Good  Fund of Knowledge:Good  Language: Good  Akathisia:  No  Handed:    AIMS (if indicated):  not done  Assets:  Desire for Improvement  ADL's:  Intact  Cognition: WNL  Sleep:  Good   Screenings: PHQ2-9     Office Visit from 12/03/2017 in [place] at [place] from 10/28/2017 in [facility] from 06/16/2017 in [place] at [place] from 10/24/2016 in [place] at [facility] Total Score  0  0  0  0  PHQ-9 Total Score  -  -  0  -      Assessment and Plan:  At this time this patient is stable.  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  At this time she will continue taking Risperdal 0.25 mg twice daily.  She shows no evidence of tardive dyskinesia.  She has no cardiovascular risk factors at this time.  Should continue taking Prozac 20 mg and trazodone 50 mg.  She will continue in one-to-one therapy with Dr. Vevelyn Francois.  Patient seems to be very stable.  She is going to continue taking her medicines and return to see me in 3 months.  At this time the patient is functioning fairly well.  She lives alone and takes care of all her basic and instrumental ADLs.  The patient certainly is not suicidal.   Jerral Ralph,  MD 4/14/20202:05 PM

## 2018-05-20 ENCOUNTER — Other Ambulatory Visit: Payer: Self-pay

## 2018-05-20 ENCOUNTER — Ambulatory Visit: Payer: Self-pay | Admitting: Gastroenterology

## 2018-05-20 NOTE — Telephone Encounter (Signed)
Ok to schedule next available appointment.

## 2018-05-25 ENCOUNTER — Ambulatory Visit: Payer: Medicare HMO | Admitting: Psychology

## 2018-06-08 ENCOUNTER — Ambulatory Visit: Payer: Medicare HMO | Admitting: Psychology

## 2018-06-12 ENCOUNTER — Other Ambulatory Visit: Payer: Self-pay | Admitting: Family Medicine

## 2018-06-22 ENCOUNTER — Ambulatory Visit: Payer: Medicare HMO | Admitting: Psychology

## 2018-07-10 ENCOUNTER — Other Ambulatory Visit: Payer: Self-pay | Admitting: Family Medicine

## 2018-07-10 DIAGNOSIS — J309 Allergic rhinitis, unspecified: Secondary | ICD-10-CM

## 2018-08-04 ENCOUNTER — Ambulatory Visit (INDEPENDENT_AMBULATORY_CARE_PROVIDER_SITE_OTHER): Payer: Medicare HMO | Admitting: Gastroenterology

## 2018-08-04 ENCOUNTER — Encounter: Payer: Self-pay | Admitting: Gastroenterology

## 2018-08-04 VITALS — Ht 63.0 in | Wt 142.0 lb

## 2018-08-04 DIAGNOSIS — M6289 Other specified disorders of muscle: Secondary | ICD-10-CM

## 2018-08-04 DIAGNOSIS — K581 Irritable bowel syndrome with constipation: Secondary | ICD-10-CM | POA: Diagnosis not present

## 2018-08-04 NOTE — Patient Instructions (Addendum)
Benefiber 1 teaspoon twice daily with meals  MiraLAX half packet daily, titrate to have 1-2 soft bowel movements daily  Schedule anorectal manometry at [facility] ( We will contact you with this appointment)   08/14/2018 at [facility]   You have been scheduled to have an anorectal manometry at [facility] on 08/14/2018 at [place]. Please arrive 30 minutes prior to your appointment time for registration (1st floor of the hospital-admissions).  Please make certain to use 1 Fleets enema 2 hours prior to coming for your appointment. You can purchase Fleets enemas from the laxative section at your drug store. You should not eat anything during the two hours prior to the procedure. You may take regular medications with small sips of water at least 2 hours prior to the study.  Anorectal manometry is a test performed to evaluate patients with constipation or fecal incontinence. This test measures the pressures of the anal sphincter muscles, the sensation in the rectum, and the neural reflexes that are needed for normal bowel movements.  THE PROCEDURE The test takes approximately 30 minutes to 1 hour. You will be asked to change into a hospital gown. A technician or nurse will explain the procedure to you, take a brief health history, and answer any questions you may have. The patient then lies on his or her left side. A small, flexible tube, about the size of a thermometer, with a balloon at the end is inserted into the rectum. The catheter is connected to a machine that measures the pressure. During the test, the small balloon attached to the catheter may be inflated in the rectum to assess the normal reflex pathways. The nurse or technician may also ask the person to squeeze, relax, and push at various times. The anal sphincter muscle pressures are measured during each of these maneuvers. To squeeze, the patient tightens the sphincter muscles as if trying to prevent anything from coming out. To  push or bear down, the patient strains down as if trying to have a bowel movement.  Follow-up virtual visit in 4 to 6 weeks  I appreciate the  opportunity to care for you  Thank You   Harl Bowie , MD

## 2018-08-04 NOTE — Progress Notes (Signed)
Nancy Herman    [id number]    [birth year]  Primary Care Physician:Jordan, Malka So, MD  Referring Physician: Martinique, Betty G, [place],  [place] [address]  This service was provided via audio only telemedicine (Doximity) due to [place] 19 pandemic.  Patient location: Home Provider location: Office Used 2 patient identifiers to confirm the correct person. Explained the limitations in evaluation and management via telemedicine. Patient is aware of potential medical charges for this visit.  Patient consented to this virtual visit.  The persons participating in this telemedicine service were myself and the patient  Interactive audio and video telecommunications were attempted between this provider and patient, however failed, due to patient having technical difficulties OR patient did not have access to video capability. We continued and completed visit with audio only.  Time spent on call: 24 min  Chief complaint:  IBS  HPI: 73 year old female with history of pituitary adenoma status post resection, hyperlipidemia, GERD, IBS, chronic constipation here for new patient visit.  She was previously followed by Dr. Collene Mares. She has chronic constipation and has difficulty evacuating, she always feels pressure in her rectum with urgency to have a bowel movement.  She has occasional small-volume bright red blood per rectum when she wipes after a bowel movement. She is taking MiraLAX with daily bowel movement. She is status post TAH/BSO and also had rectocele repair in 2016 GERD symptoms currently controlled on Protonix daily She is taking MiraLAX with daily bowel movement. Did not tolerate Linzess. She thinks she had anorectal manometry many years ago at [facility] but does not recall having physical therapy or any follow-up after that  Colonoscopy March 31, 2014 showed internal hemorrhoids, mild proctitis biopsies showed colonic mucosal hyperplasia and fibrosis  suggestive of rectal prolapse, tubular adenoma removed from ascending colon EGD March 31, 2014 showed gastritis, duodenitis and small hiatal hernia EGD August 12, 2016 by Dr. Collene Mares showed small hiatal hernia otherwise normal exam    Outpatient Encounter Medications as of 08/04/2018  Medication Sig  . Calcium Carbonate-Vit D-Min (CALCIUM 1200 PO) Take 1 tablet by mouth daily after breakfast.   . FLUoxetine (PROZAC) 20 MG capsule Take 1 capsule (20 mg total) by mouth daily.  . montelukast (SINGULAIR) 10 MG tablet TAKE 1 TABLET BY MOUTH AT BEDTIME  . pantoprazole (PROTONIX) 40 MG tablet Take 1 tablet (40 mg total) by mouth daily.  . polyethylene glycol (MIRALAX) packet Take 17 g by mouth daily. (Patient taking differently: Take 17 g by mouth every other day. )  . pravastatin (PRAVACHOL) 20 MG tablet Take 1 tablet by mouth once daily  . risperiDONE (RISPERDAL) 0.25 MG tablet Take 1 tablet (0.25 mg total) by mouth 2 (two) times daily.  . traZODone (DESYREL) 50 MG tablet Take 1 tablet (50 mg total) by mouth at bedtime.   No facility-administered encounter medications on file as of 08/04/2018.     Allergies as of 08/04/2018 - Review Complete 05/19/2018  Allergen Reaction Noted  . Other Other (See Comments) 10/28/2017    Past Medical History:  Diagnosis Date  . Anxiety   . Frequent headaches   . GERD (gastroesophageal reflux disease)   . Hyperlipidemia   . IBS (irritable bowel syndrome)   . Pituitary insufficiency ([place])   . Thyroid disease     Past Surgical History:  Procedure Laterality Date  . ABDOMINAL HYSTERECTOMY  1990  . BRAIN SURGERY    .  BREAST EXCISIONAL BIOPSY Right unsure  . BREAST EXCISIONAL BIOPSY Left unsure  . OVARIAN CYST SURGERY  1994  . REPAIR RECTOCELE  2016   and prolapsed uterus.  . TONSILLECTOMY  1973    Family History  Problem Relation Age of Onset  . Heart disease Mother        No details   . Alcohol abuse Mother   . Drug abuse Mother   . Sickle cell  anemia Mother   . Heart disease Father        No details . Questionable bypass or stent  . Drug abuse Father   . Alcohol abuse Father     Social History   Socioeconomic History  . Marital status: Single    Spouse name: Not on file  . Number of children: Not on file  . Years of education: Not on file  . Highest education level: Not on file  Occupational History  . Not on file  Social Needs  . Financial resource strain: Not on file  . Food insecurity    Worry: Not on file    Inability: Not on file  . Transportation needs    Medical: Not on file    Non-medical: Not on file  Tobacco Use  . Smoking status: Never Smoker  . Smokeless tobacco: Never Used  Substance and Sexual Activity  . Alcohol use: No  . Drug use: No  . Sexual activity: Never  Lifestyle  . Physical activity    Days per week: Not on file    Minutes per session: Not on file  . Stress: Not on file  Relationships  . Social Herbalist on phone: Not on file    Gets together: Not on file    Attends religious service: Not on file    Active member of club or organization: Not on file    Attends meetings of clubs or organizations: Not on file    Relationship status: Not on file  . Intimate partner violence    Fear of current or ex partner: Not on file    Emotionally abused: Not on file    Physically abused: Not on file    Forced sexual activity: Not on file  Other Topics Concern  . Not on file  Social History Narrative   Lives alone.  Lives near son.  Was in [place].      Review of systems: Review of Systems as per HPI All other systems reviewed and are negative.   Physical Exam: Vitals were not taken and physical exam was not performed during this virtual visit.  Data Reviewed:  Reviewed labs, radiology imaging, old records and pertinent past GI work up   Assessment and Plan/Recommendations:  73 year old female with history of hyperlipidemia, GERD, IBS, pituitary adenoma status post  resection, depression, TAH/BSO, rectocele repair  IBS predominant constipation Benefiber 1 teaspoon twice daily MiraLAX daily, titrate to have 1-2 soft bowel movement daily Increase dietary fiber and water intake  Pelvic floor dysfunction/?  Dyssynergy defecation We will schedule for anorectal manometry at [facility] endoscopy The risks and benefits as well as alternatives of  procedure(s) have been discussed and reviewed. All questions answered. The patient agrees to proceed. Plan to refer to pelvic floor physical therapy based on anorectal manometry findings     K. Denzil Magnuson , MD   CC: Martinique, Betty G, MD

## 2018-08-06 ENCOUNTER — Encounter: Payer: Self-pay | Admitting: Gastroenterology

## 2018-08-10 ENCOUNTER — Other Ambulatory Visit (HOSPITAL_COMMUNITY): Payer: Medicare HMO

## 2018-08-11 ENCOUNTER — Other Ambulatory Visit (HOSPITAL_COMMUNITY)
Admission: RE | Admit: 2018-08-11 | Discharge: 2018-08-11 | Disposition: A | Discharge status: Home / Self Care | Payer: Medicare HMO | Source: Ambulatory Visit | Attending: Gastroenterology | Admitting: Gastroenterology

## 2018-08-11 DIAGNOSIS — Z1159 Encounter for screening for other viral diseases: Secondary | ICD-10-CM | POA: Diagnosis not present

## 2018-08-11 DIAGNOSIS — Z01812 Encounter for preprocedural laboratory examination: Secondary | ICD-10-CM | POA: Diagnosis present

## 2018-08-11 LAB — SARS CORONAVIRUS 2 (TAT 6-24 HRS): SARS Coronavirus 2: NEGATIVE

## 2018-08-14 ENCOUNTER — Ambulatory Visit (HOSPITAL_COMMUNITY)
Admission: RE | Admit: 2018-08-14 | Discharge: 2018-08-14 | Disposition: A | Discharge status: Home / Self Care | Payer: Medicare HMO | Attending: Gastroenterology | Admitting: Gastroenterology

## 2018-08-14 ENCOUNTER — Encounter (HOSPITAL_COMMUNITY)
Admission: RE | Disposition: A | Discharge status: Home / Self Care | Payer: Self-pay | Source: Home / Self Care | Attending: Gastroenterology

## 2018-08-14 DIAGNOSIS — K5902 Outlet dysfunction constipation: Secondary | ICD-10-CM | POA: Diagnosis not present

## 2018-08-14 DIAGNOSIS — K5900 Constipation, unspecified: Secondary | ICD-10-CM | POA: Diagnosis present

## 2018-08-14 DIAGNOSIS — R150 Incomplete defecation: Secondary | ICD-10-CM | POA: Diagnosis not present

## 2018-08-14 HISTORY — PX: ANAL RECTAL MANOMETRY: SHX6358

## 2018-08-14 SURGERY — MANOMETRY, ANORECTAL

## 2018-08-14 NOTE — Progress Notes (Signed)
Anal manometry done per protocol.  Patient tolerated well.  Report sent to Dr. Silverio Decamp.

## 2018-08-17 ENCOUNTER — Encounter (HOSPITAL_COMMUNITY): Payer: Self-pay | Admitting: Gastroenterology

## 2018-08-19 ENCOUNTER — Other Ambulatory Visit: Payer: Self-pay

## 2018-08-19 ENCOUNTER — Ambulatory Visit (INDEPENDENT_AMBULATORY_CARE_PROVIDER_SITE_OTHER): Payer: Medicare HMO | Admitting: Psychiatry

## 2018-08-19 DIAGNOSIS — F329 Major depressive disorder, single episode, unspecified: Secondary | ICD-10-CM

## 2018-08-19 DIAGNOSIS — F323 Major depressive disorder, single episode, severe with psychotic features: Secondary | ICD-10-CM

## 2018-08-19 DIAGNOSIS — F32A Depression, unspecified: Secondary | ICD-10-CM

## 2018-08-19 DIAGNOSIS — F411 Generalized anxiety disorder: Secondary | ICD-10-CM | POA: Diagnosis not present

## 2018-08-19 MED ORDER — TRAZODONE HCL 50 MG PO TABS: 50.0000 mg | ORAL_TABLET | Freq: Every day | ORAL | 5 refills | Status: DC

## 2018-08-19 MED ORDER — RISPERIDONE 0.25 MG PO TABS: 0.2500 mg | ORAL_TABLET | Freq: Two times a day (BID) | ORAL | 5 refills | Status: DC

## 2018-08-19 MED ORDER — FLUOXETINE HCL 20 MG PO CAPS: 20.0000 mg | ORAL_CAPSULE | Freq: Every day | ORAL | 5 refills | Status: DC

## 2018-08-19 NOTE — Progress Notes (Signed)
Psychiatric Initial Adult Assessment   Patient Identification: Nancy Herman MRN:  [id number] Date of Evaluation:  08/19/2018 Referral Source: Dr. Betty Martinique Chief Complaint:   Visit Diagnosis:    ICD-10-CM   1. Depressive disorder  F32.9 FLUoxetine (PROZAC) 20 MG capsule  2. Generalized anxiety disorder  F41.1 FLUoxetine (PROZAC) 20 MG capsule    History of Present Illness:    Today the patient is doing well.  Unfortunately this was a shortened session.  Patient got a new dog and likes it.  Patient shows no signs of being depressed.  She takes her medicines as prescribed.  She is sleeping and eating well.  She is got good energy.  She is handling the viral pandemic fairly well.  She isolates herself and is being very responsible.  The patient is going to start with a new therapist.  Patient seems positive and optimistic.  She does not think anybody is trying to intrude upon her or hurt her.  The patient denies chest pain or shortness of breath or any respiratory symptoms. Associated Signs/Symptoms: Depression Symptoms:  difficulty concentrating, (Hypo) Manic Symptoms:   Anxiety Symptoms:   Psychotic Symptoms:   PTSD Symptoms:   Past Psychiatric History: Prozac for many years  Previous Psychotropic Medications:   Substance Abuse History in the last 12 months:  No.  Consequences of Substance Abuse:   Past Medical History:  Past Medical History:  Diagnosis Date  . Anxiety   . Frequent headaches   . GERD (gastroesophageal reflux disease)   . Hyperlipidemia   . IBS (irritable bowel syndrome)   . Pituitary insufficiency ([place])   . Thyroid disease     Past Surgical History:  Procedure Laterality Date  . ABDOMINAL HYSTERECTOMY  1990  . ANAL RECTAL MANOMETRY N/A 08/14/2018   Procedure: ANO RECTAL MANOMETRY;  Surgeon: Mauri Pole, MD;  Location: [facility];  Service: Endoscopy;  Laterality: N/A;  . BRAIN SURGERY    . BREAST EXCISIONAL BIOPSY Right unsure  .  BREAST EXCISIONAL BIOPSY Left unsure  . OVARIAN CYST SURGERY  1994  . REPAIR RECTOCELE  2016   and prolapsed uterus.  . TONSILLECTOMY  1973    Family Psychiatric History:   Family History:  Family History  Problem Relation Age of Onset  . Heart disease Mother        No details   . Alcohol abuse Mother   . Drug abuse Mother   . Sickle cell anemia Mother   . Heart disease Father        No details . Questionable bypass or stent  . Drug abuse Father   . Alcohol abuse Father     Social History:   Social History   Socioeconomic History  . Marital status: Single    Spouse name: Not on file  . Number of children: Not on file  . Years of education: Not on file  . Highest education level: Not on file  Occupational History  . Not on file  Social Needs  . Financial resource strain: Not on file  . Food insecurity    Worry: Not on file    Inability: Not on file  . Transportation needs    Medical: Not on file    Non-medical: Not on file  Tobacco Use  . Smoking status: Never Smoker  . Smokeless tobacco: Never Used  Substance and Sexual Activity  . Alcohol use: No  . Drug use: No  . Sexual activity: Never  Lifestyle  .  Physical activity    Days per week: Not on file    Minutes per session: Not on file  . Stress: Not on file  Relationships  . Social Herbalist on phone: Not on file    Gets together: Not on file    Attends religious service: Not on file    Active member of club or organization: Not on file    Attends meetings of clubs or organizations: Not on file    Relationship status: Not on file  Other Topics Concern  . Not on file  Social History Narrative   Lives alone.  Lives near son.  Was in [place].    Additional Social History:   Allergies:   Allergies  Allergen Reactions  . Other Other (See Comments)    Seasonal allergies    Metabolic Disorder Labs: Lab Results  Component Value Date   HGBA1C 5.8 12/08/2017   Lab Results  Component Value  Date   PROLACTIN 5.0 03/04/2018   PROLACTIN 8.8 09/26/2015   Lab Results  Component Value Date   CHOL 216 (H) 12/08/2017   TRIG 68.0 12/08/2017   HDL 80.80 12/08/2017   CHOLHDL 3 12/08/2017   VLDL 13.6 12/08/2017   LDLCALC 122 (H) 12/08/2017   LDLCALC 136 (H) 09/30/2016   Lab Results  Component Value Date   TSH 3.80 04/03/2018    Therapeutic Level Labs: No results found for: LITHIUM No results found for: CBMZ No results found for: VALPROATE  Current Medications: Current Outpatient Medications  Medication Sig Dispense Refill  . Calcium Carbonate-Vit D-Min (CALCIUM 1200 PO) Take 1 tablet by mouth daily after breakfast.     . FLUoxetine (PROZAC) 20 MG capsule Take 1 capsule (20 mg total) by mouth daily. 30 capsule 5  . montelukast (SINGULAIR) 10 MG tablet TAKE 1 TABLET BY MOUTH AT BEDTIME 90 tablet 0  . pantoprazole (PROTONIX) 40 MG tablet Take 1 tablet (40 mg total) by mouth daily. 30 tablet 3  . polyethylene glycol (MIRALAX) packet Take 17 g by mouth daily. (Patient taking differently: Take 17 g by mouth every other day. ) 14 each 0  . pravastatin (PRAVACHOL) 20 MG tablet Take 1 tablet by mouth once daily 90 tablet 0  . risperiDONE (RISPERDAL) 0.25 MG tablet Take 1 tablet (0.25 mg total) by mouth 2 (two) times daily. 60 tablet 5  . traZODone (DESYREL) 50 MG tablet Take 1 tablet (50 mg total) by mouth at bedtime. 30 tablet 5   No current facility-administered medications for this visit.     Musculoskeletal: Strength & Muscle Tone: within normal limits Gait & Station: normal Patient leans:   Psychiatric Specialty Exam: ROS  There were no vitals taken for this visit.There is no height or weight on file to calculate BMI.  General Appearance: Casual  Eye Contact:  Good  Speech: Good  Volume:  Normal  Mood:  NA  Affect:  NA and Appropriate  Thought Process:  Coherent  Orientation:  Full (Time, Place, and Person)  Thought Content:  Logical  Suicidal Thoughts:  No   Homicidal Thoughts:  No  Memory:  Negative  Judgement:  Good  Insight:  Fair  Psychomotor Activity:  Normal  Concentration:    Recall:  Good  Fund of Knowledge:Good  Language: Good  Akathisia:  No  Handed:    AIMS (if indicated):  not done  Assets:  Desire for Improvement  ADL's:  Intact  Cognition: WNL  Sleep:  Good  Screenings: PHQ2-9     Office Visit from 12/03/2017 in [place] at [place] from 10/28/2017 in [place] at [facility] from 06/16/2017 in [place] at [place] from 10/24/2016 in [place] at [facility] Total Score  0  0  0  0  PHQ-9 Total Score  -  -  0  -      Assessment and Plan:  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  She is doing very well taking Risperdal 0.25 mg twice daily.  Her next visit we will begin to reduce it.  She will continue taking Prozac 20 mg and also trazodone for sleep.  The patient is not suicidal.  She is functioning very well.  She is not fatigued.  She denies chest pain shortness of breath or any neurological symptoms at this time.   Jerral Ralph, MD 7/15/20204:06 PM

## 2018-08-31 ENCOUNTER — Ambulatory Visit (INDEPENDENT_AMBULATORY_CARE_PROVIDER_SITE_OTHER): Payer: Medicare HMO | Admitting: Family Medicine

## 2018-08-31 ENCOUNTER — Encounter: Payer: Self-pay | Admitting: Family Medicine

## 2018-08-31 ENCOUNTER — Other Ambulatory Visit: Payer: Self-pay

## 2018-08-31 DIAGNOSIS — F5105 Insomnia due to other mental disorder: Secondary | ICD-10-CM

## 2018-08-31 DIAGNOSIS — F99 Mental disorder, not otherwise specified: Secondary | ICD-10-CM

## 2018-08-31 DIAGNOSIS — J309 Allergic rhinitis, unspecified: Secondary | ICD-10-CM

## 2018-08-31 DIAGNOSIS — F411 Generalized anxiety disorder: Secondary | ICD-10-CM | POA: Diagnosis not present

## 2018-08-31 DIAGNOSIS — F323 Major depressive disorder, single episode, severe with psychotic features: Secondary | ICD-10-CM

## 2018-08-31 DIAGNOSIS — K581 Irritable bowel syndrome with constipation: Secondary | ICD-10-CM | POA: Diagnosis not present

## 2018-08-31 NOTE — Assessment & Plan Note (Signed)
Problem is now well controlled. Continue following with psychiatrist and psychotherapist periodically. No changes in current management.

## 2018-08-31 NOTE — Progress Notes (Signed)
Virtual Visit via Video Note   I connected with Nancy Herman on 09/03/18 at 10:30 AM EDT by a video enabled telemedicine application and verified that I am speaking with the correct person using two identifiers.  Location patient: home Location provider:work office Persons participating in the virtual visit: patient, provider  I discussed the limitations of evaluation and management by telemedicine and the availability of in person appointments. The patient expressed understanding and agreed to proceed.   HPI:  Nancy Herman is a 73 yo female following on some chronic medical problems today. She was last seen 03/31/18.  She has followed with nutritionist,following dietary recommendations,so IBS symptoms greatly improved. GERD,she is on Pantoprazole 40 mg daily. Negative for abdominal pain, nausea, vomiting, changes in bowel habits, or blood in the stool.  Allergy rhinitis, at this time she is not having rhinorrhea, postnasal drainage, nasal congestion, cough, wheezing. Currently she is on Singulair 10 mg daily. Started nasal irrigations with saline a few times per week.  Negative for fever or chills.  Depression and anxiety, she is following with psychiatrist and psychotherapist. Currently she is on Prozac 20 mg daily, risperidone 0.25 mg twice daily, and trazodone 50 mg daily. She denies depressed mood or suicidal thoughts. Tolerating medication well.  Insomnia has improved with Risperidone ,sleeping about 8 hours. Last appt with Dr Casimiro Needle on 08/19/18. Next appt in 3 months.  She is living alone, her son calls her almost daily.  She has an appt with Dr Loanne Drilling in a few days to follow on pituitary insufficiency.  ROS: See pertinent positives and negatives per HPI.  Past Medical History:  Diagnosis Date  . Anxiety   . Frequent headaches   . GERD (gastroesophageal reflux disease)   . Hyperlipidemia   . IBS (irritable bowel syndrome)   . 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 Thyroid  disease     Past Surgical History:  Procedure Laterality Date  . ABDOMINAL HYSTERECTOMY  1990  . ANAL RECTAL MANOMETRY N/A 08/14/2018   Procedure: ANO RECTAL MANOMETRY;  Surgeon: Mauri Pole, MD;  Location: [facility];  Service: Endoscopy;  Laterality: N/A;  . BRAIN SURGERY    . BREAST EXCISIONAL BIOPSY Right unsure  . BREAST EXCISIONAL BIOPSY Left unsure  . OVARIAN CYST SURGERY  1994  . REPAIR RECTOCELE  2016   and prolapsed uterus.  . TONSILLECTOMY  1973    Family History  Problem Relation Age of Onset  . Heart disease Mother        No details   . Alcohol abuse Mother   . Drug abuse Mother   . Sickle cell anemia Mother   . Heart disease Father        No details . Questionable bypass or stent  . Drug abuse Father   . Alcohol abuse Father     Social History   Socioeconomic History  . Marital status: Single    Spouse name: Not on file  . Number of children: Not on file  . Years of education: Not on file  . Highest education level: Not on file  Occupational History  . Not on file  Social Needs  . Financial resource strain: Not on file  . Food insecurity    Worry: Not on file    Inability: Not on file  . Transportation needs    Medical: Not on file    Non-medical: Not on file  Tobacco Use  . Smoking status: Never Smoker  . Smokeless tobacco: Never Used  Substance and Sexual Activity  . Alcohol use: No  . Drug use: No  . Sexual activity: Never  Lifestyle  . Physical activity    Days per week: Not on file    Minutes per session: Not on file  . Stress: Not on file  Relationships  . Social Herbalist on phone: Not on file    Gets together: Not on file    Attends religious service: Not on file    Active member of club or organization: Not on file    Attends meetings of clubs or organizations: Not on file    Relationship status: Not on file  . Intimate partner violence    Fear of current or ex partner: Not on file    Emotionally abused:  Not on file    Physically abused: Not on file    Forced sexual activity: Not on file  Other Topics Concern  . Not on file  Social History Narrative   Lives alone.  Lives near son.  Was in [place].    Current Outpatient Medications:  .  Calcium Carbonate-Vit D-Min (CALCIUM 1200 PO), Take 1 tablet by mouth daily after breakfast. , Disp: , Rfl:  .  FLUoxetine (PROZAC) 20 MG capsule, Take 1 capsule (20 mg total) by mouth daily., Disp: 30 capsule, Rfl: 5 .  montelukast (SINGULAIR) 10 MG tablet, TAKE 1 TABLET BY MOUTH AT BEDTIME, Disp: 90 tablet, Rfl: 0 .  pantoprazole (PROTONIX) 40 MG tablet, Take 1 tablet (40 mg total) by mouth daily., Disp: 30 tablet, Rfl: 3 .  polyethylene glycol (MIRALAX) packet, Take 17 g by mouth daily. (Patient taking differently: Take 17 g by mouth every other day. ), Disp: 14 each, Rfl: 0 .  pravastatin (PRAVACHOL) 20 MG tablet, Take 1 tablet by mouth once daily, Disp: 90 tablet, Rfl: 0 .  risperiDONE (RISPERDAL) 0.25 MG tablet, Take 1 tablet (0.25 mg total) by mouth 2 (two) times daily., Disp: 60 tablet, Rfl: 5 .  traZODone (DESYREL) 50 MG tablet, Take 1 tablet (50 mg total) by mouth at bedtime., Disp: 30 tablet, Rfl: 5  EXAM:  VITALS per patient if applicable:N/A  GENERAL: alert, oriented, appears well and in no acute distress  HEENT: atraumatic, normocephalic, conjunctiva clear.  NECK: normal movements of the head and neck  LUNGS: on inspection no signs of respiratory distress, breathing rate appears normal, no obvious gross SOB, gasping or wheezing  CV: no obvious cyanosis  Nancy: moves all visible extremities without noticeable abnormality  PSYCH/NEURO: pleasant and cooperative, no obvious depression or anxiety, speech and thought processing grossly intact  ASSESSMENT AND PLAN:  Discussed the following assessment and plan:  Insomnia disorder Problem is better controlled. Continue good sleep hygiene. No changes in trazodone dose.   Major depressive  disorder, single episode with psychotic features ([place]) Problem is now well controlled. Continue following with psychiatrist and psychotherapist periodically. No changes in current management.  IBS (irritable bowel syndrome) Reporting improvement of symptoms. Continue following dietary recommendations. Adequate fiber and fluid intake. Instructed about warning signs.   Allergic rhinitis Problem is well controlled at this time. No changes in Singulair 10 mg daily. Continue nasal irrigation with saline as needed.  Anxiety disorder Problem has improved. No changes in fluoxetine 20 mg daily. Continue following with psychiatrist.    I discussed the assessment and treatment plan with the patient.  She was provided an opportunity to ask questions and all were answered.  She agreed with the plan  and demonstrated an understanding of the instructions.     Return in about 4 months (around 01/01/2019) for HLD.    Mckenzy Salazar Martinique, MD

## 2018-08-31 NOTE — Assessment & Plan Note (Signed)
Reporting improvement of symptoms. Continue following dietary recommendations. Adequate fiber and fluid intake. Instructed about warning signs.

## 2018-08-31 NOTE — Assessment & Plan Note (Signed)
Problem is well controlled at this time. No changes in Singulair 10 mg daily. Continue nasal irrigation with saline as needed.

## 2018-08-31 NOTE — Assessment & Plan Note (Signed)
Problem is better controlled. Continue good sleep hygiene. No changes in trazodone dose.

## 2018-08-31 NOTE — Assessment & Plan Note (Signed)
Problem has improved. No changes in fluoxetine 20 mg daily. Continue following with psychiatrist.

## 2018-09-02 ENCOUNTER — Telehealth: Payer: Self-pay | Admitting: Gastroenterology

## 2018-09-02 ENCOUNTER — Encounter: Payer: Self-pay | Admitting: Endocrinology

## 2018-09-02 ENCOUNTER — Ambulatory Visit: Payer: Medicare HMO | Admitting: Endocrinology

## 2018-09-02 ENCOUNTER — Other Ambulatory Visit: Payer: Self-pay

## 2018-09-02 VITALS — BP 112/60 | HR 84 | Ht 63.0 in | Wt 152.6 lb

## 2018-09-02 DIAGNOSIS — E230 Hypopituitarism: Secondary | ICD-10-CM | POA: Diagnosis not present

## 2018-09-02 LAB — TSH: TSH: 2.41 u[IU]/mL (ref 0.35–4.50)

## 2018-09-02 LAB — T4, FREE: Free T4: 0.84 ng/dL (ref 0.60–1.60)

## 2018-09-02 NOTE — Telephone Encounter (Signed)
Pt called inquiring about results of manometry.

## 2018-09-02 NOTE — Telephone Encounter (Signed)
Patient inquired about anorectal mano results from 7/10. Currently no results in Epic. Please advise.

## 2018-09-02 NOTE — Progress Notes (Signed)
Subjective:    Patient ID: Nancy Herman, female    DOB: September 04, 1945, 73 y.o.   MRN: [id number]  HPI Pt returns for f/u of pituitary adenoma (apparently nonsecretory; was 1.8 mm diameter; dx'ed and resected in 2017 (in [place]); she did not have XRT; she took synthroid until early 2020; since then, she is on no pituitary rx; f/u MRI in 2019 showed no residual tumor).   She had these pit function results in early 2020:  FSH/LH: normal (not menopausal).   Prol: normal ACTH: stim test normal GH: IGF-1 was normal VP: USG=1.010, VP was undetectable; 24-HR urine was 2300 ml.   TSH: euthyroid off synthroid.  She has been off synthroid since early 2020.  pt states she feels well in general.   Past Medical History:  Diagnosis Date  . Anxiety   . Frequent headaches   . GERD (gastroesophageal reflux disease)   . Hyperlipidemia   . IBS (irritable bowel syndrome)   . Pituitary insufficiency ([place])   . Thyroid disease     Past Surgical History:  Procedure Laterality Date  . ABDOMINAL HYSTERECTOMY  1990  . ANAL RECTAL MANOMETRY N/A 08/14/2018   Procedure: ANO RECTAL MANOMETRY;  Surgeon: Mauri Pole, MD;  Location: [facility];  Service: Endoscopy;  Laterality: N/A;  . BRAIN SURGERY    . BREAST EXCISIONAL BIOPSY Right unsure  . BREAST EXCISIONAL BIOPSY Left unsure  . OVARIAN CYST SURGERY  1994  . REPAIR RECTOCELE  2016   and prolapsed uterus.  . TONSILLECTOMY  1973    Social History   Socioeconomic History  . Marital status: Single    Spouse name: Not on file  . Number of children: Not on file  . Years of education: Not on file  . Highest education level: Not on file  Occupational History  . Not on file  Social Needs  . Financial resource strain: Not on file  . Food insecurity    Worry: Not on file    Inability: Not on file  . Transportation needs    Medical: Not on file    Non-medical: Not on file  Tobacco Use  . Smoking status: Never Smoker  . Smokeless tobacco:  Never Used  Substance and Sexual Activity  . Alcohol use: No  . Drug use: No  . Sexual activity: Never  Lifestyle  . Physical activity    Days per week: Not on file    Minutes per session: Not on file  . Stress: Not on file  Relationships  . Social Herbalist on phone: Not on file    Gets together: Not on file    Attends religious service: Not on file    Active member of club or organization: Not on file    Attends meetings of clubs or organizations: Not on file    Relationship status: Not on file  . Intimate partner violence    Fear of current or ex partner: Not on file    Emotionally abused: Not on file    Physically abused: Not on file    Forced sexual activity: Not on file  Other Topics Concern  . Not on file  Social History Narrative   Lives alone.  Lives near son.  Was in [place].    Current Outpatient Medications on File Prior to Visit  Medication Sig Dispense Refill  . Calcium Carbonate-Vit D-Min (CALCIUM 1200 PO) Take 1 tablet by mouth daily after breakfast.     .  FLUoxetine (PROZAC) 20 MG capsule Take 1 capsule (20 mg total) by mouth daily. 30 capsule 5  . montelukast (SINGULAIR) 10 MG tablet TAKE 1 TABLET BY MOUTH AT BEDTIME 90 tablet 0  . pantoprazole (PROTONIX) 40 MG tablet Take 1 tablet (40 mg total) by mouth daily. 30 tablet 3  . polyethylene glycol (MIRALAX) packet Take 17 g by mouth daily. (Patient taking differently: Take 17 g by mouth every other day. ) 14 each 0  . pravastatin (PRAVACHOL) 20 MG tablet Take 1 tablet by mouth once daily 90 tablet 0  . risperiDONE (RISPERDAL) 0.25 MG tablet Take 1 tablet (0.25 mg total) by mouth 2 (two) times daily. 60 tablet 5  . traZODone (DESYREL) 50 MG tablet Take 1 tablet (50 mg total) by mouth at bedtime. 30 tablet 5   No current facility-administered medications on file prior to visit.     Allergies  Allergen Reactions  . Other Other (See Comments)    Seasonal allergies    Family History  Problem Relation  Age of Onset  . Heart disease Mother        No details   . Alcohol abuse Mother   . Drug abuse Mother   . Sickle cell anemia Mother   . Heart disease Father        No details . Questionable bypass or stent  . Drug abuse Father   . Alcohol abuse Father     BP 112/60 (BP Location: Left Arm, Patient Position: Sitting, Cuff Size: Normal)   Pulse 84   Ht 5\' 3"  (1.6 m)   Wt 152 lb 9.6 oz (69.2 kg)   LMP  (LMP Unknown)   SpO2 96%   BMI 27.03 kg/m    Review of Systems Denies falls.      Objective:   Physical Exam VITAL SIGNS:  See vs page GENERAL: no distress Gait: normal and steady.       Assessment & Plan:  Pituitary adenoma: clinically stable Central hypothyroidism: due for recheck  Patient Instructions  Blood tests are requested for you today.  We'll let you know about the results.  Please come back for a follow-up appointment in 6 months.

## 2018-09-02 NOTE — Patient Instructions (Signed)
Blood tests are requested for you today.  We'll let you know about the results.  Please come back for a follow-up appointment in 6 months.   

## 2018-09-03 DIAGNOSIS — K5902 Outlet dysfunction constipation: Secondary | ICD-10-CM

## 2018-09-03 NOTE — Telephone Encounter (Signed)
Called the patient. She cannot hear me on the phone and hangs up. Will try again tomorrow.

## 2018-09-03 NOTE — Telephone Encounter (Signed)
Anorectal manometry consistent with dyssynergia defecation, pelvic floor dysfunction. Please inform patient the results and send referral to pelvic floor PT (cheryl Pearline Cables). Thanks

## 2018-09-12 ENCOUNTER — Other Ambulatory Visit: Payer: Self-pay | Admitting: Family Medicine

## 2018-09-14 ENCOUNTER — Other Ambulatory Visit: Payer: Self-pay

## 2018-09-14 DIAGNOSIS — M6289 Other specified disorders of muscle: Secondary | ICD-10-CM

## 2018-09-14 DIAGNOSIS — K5902 Outlet dysfunction constipation: Secondary | ICD-10-CM

## 2018-09-14 NOTE — Telephone Encounter (Signed)
Patient is advised.  Referral placed.

## 2018-09-22 ENCOUNTER — Ambulatory Visit: Payer: Medicare HMO | Admitting: Physical Therapy

## 2018-09-29 ENCOUNTER — Other Ambulatory Visit: Payer: Self-pay

## 2018-09-29 ENCOUNTER — Encounter: Payer: Self-pay | Admitting: Physical Therapy

## 2018-09-29 ENCOUNTER — Ambulatory Visit: Payer: Medicare HMO | Attending: Gastroenterology | Admitting: Physical Therapy

## 2018-09-29 DIAGNOSIS — R278 Other lack of coordination: Secondary | ICD-10-CM | POA: Diagnosis present

## 2018-09-29 DIAGNOSIS — R32 Unspecified urinary incontinence: Secondary | ICD-10-CM | POA: Diagnosis present

## 2018-09-29 DIAGNOSIS — K5900 Constipation, unspecified: Secondary | ICD-10-CM | POA: Diagnosis present

## 2018-09-29 DIAGNOSIS — M6281 Muscle weakness (generalized): Secondary | ICD-10-CM | POA: Diagnosis not present

## 2018-09-29 NOTE — Patient Instructions (Addendum)
About Abdominal Massage  Abdominal massage, also called external colon massage, is a self-treatment circular massage technique that can reduce and eliminate gas and ease constipation. The colon naturally contracts in waves in a clockwise direction starting from inside the right hip, moving up toward the ribs, across the belly, and down inside the left hip.  When you perform circular abdominal massage, you help stimulate your colon's normal wave pattern of movement called peristalsis.  It is most beneficial when done after eating.  Positioning You can practice abdominal massage with oil while lying down, or in the shower with soap.  Some people find that it is just as effective to do the massage through clothing while sitting or standing.  How to Massage Start by placing your finger tips or knuckles on your right side, just inside your hip bone.  . Make small circular movements while you move upward toward your rib cage.   . Once you reach the bottom right side of your rib cage, take your circular movements across to the left side of the bottom of your rib cage.  . Next, move downward until you reach the inside of your left hip bone.  This is the path your feces travel in your colon. . Continue to perform your abdominal massage in this pattern for 10 minutes each day.     You can apply as much pressure as is comfortable in your massage.  Start gently and build pressure as you continue to practice.  Notice any areas of pain as you massage; areas of slight pain may be relieved as you massage, but if you have areas of significant or intense pain, consult with your healthcare provider.  Other Considerations . General physical activity including bending and stretching can have a beneficial massage-like effect on the colon.  Deep breathing can also stimulate the colon because breathing deeply activates the same nervous system that supplies the colon.   . Abdominal massage should always be used in  combination with a bowel-conscious diet that is high in the proper type of fiber for you, fluids (primarily water), and a regular exercise program. Supine    Lie flat with pillow support. Allow body's muscles to relax. Place hands on belly. Inhale slowly and deeply for _3__ seconds, so hands move up. Then take _3__ seconds to exhale. Repeat _10__ times. Do 2___ times a day.   Copyright  VHI. All rights reserved.  Access Code: [email or web address]  URL: [email or web address]  Date: 09/29/2018  Prepared by: Earlie Counts   Exercises Quadruped Rocking Backward - 10 reps - 1 sets - 1x daily - 7x weekly Child's Pose Stretch - 1 reps - 1 sets - 30 sec hold - 1x daily - 7x weekly Seated Piriformis Stretch with Trunk Bend - 2 reps - 1 sets - 30 sec hold - 1x daily - 7x weekly [facility] [address], [place], [place] [address] Phone # [phone] Fax [phone]

## 2018-09-29 NOTE — Therapy (Signed)
[facility] [address], [place], [place], [address] Phone: [phone]   Fax:  [phone]  Physical Therapy Evaluation  Patient Details  Name: Nancy Herman MRN: [id number] Date of Birth: 15-Apr-1945 Referring Provider (PT): Dr. Harl Bowie   Encounter Date: 09/29/2018  PT End of Session - 09/29/18 1304    Visit Number  1    Date for PT Re-Evaluation  11/24/18    Authorization Type  [employer]    PT Start Time  [id number]    PT Stop Time  [id number]    PT Time Calculation (min)  35 min    Activity Tolerance  Patient tolerated treatment well;No increased pain    Behavior During Therapy  WFL for tasks assessed/performed       Past Medical History:  Diagnosis Date  . Anxiety   . Frequent headaches   . GERD (gastroesophageal reflux disease)   . Hyperlipidemia   . IBS (irritable bowel syndrome)   . Pituitary insufficiency ([place])   . Thyroid disease     Past Surgical History:  Procedure Laterality Date  . ABDOMINAL HYSTERECTOMY  1990  . ANAL RECTAL MANOMETRY N/A 08/14/2018   Procedure: ANO RECTAL MANOMETRY;  Surgeon: Mauri Pole, MD;  Location: [facility];  Service: Endoscopy;  Laterality: N/A;  . BRAIN SURGERY    . BREAST EXCISIONAL BIOPSY Right unsure  . BREAST EXCISIONAL BIOPSY Left unsure  . OVARIAN CYST SURGERY  1994  . REPAIR RECTOCELE  2016   and prolapsed uterus.  . TONSILLECTOMY  1973    There were no vitals filed for this visit.   Subjective Assessment - 09/29/18 1228    Subjective  I have constipation for a long time. I am not able to fully have a bowel movement or fully urinate. Pains on my tailbone and hip. Patient has Type 4 bowel movement right now. Patient has to strain to have a bowel movement. Patient will be in the bathroom for 30 minutes.    Patient Stated Goals  not strain for a bowel movement    Currently in Pain?  Yes    Pain Score  8     Pain Location  Coccyx    Pain Orientation  Mid    Pain Descriptors / Indicators  Shooting    Pain Type  Acute pain    Pain Onset  More than a month ago    Pain Frequency  Intermittent    Aggravating Factors   at the point to move her bowels    Pain Relieving Factors  bowel movement, lay on right side         [facility] PT Assessment - 09/29/18 0001      Assessment   Medical Diagnosis  K58.1 Irritable bowel syndrome with constipation; M62.89 Pelvic floor dysfunction    Referring Provider (PT)  Dr. Harl Bowie    Onset Date/Surgical Date  --   chronic   Prior Therapy  none      Precautions   Precautions  None      Restrictions   Weight Bearing Restrictions  No      Balance Screen   Has the patient fallen in the past 6 months  No    Has the patient had a decrease in activity level because of a fear of falling?   No    Is the patient reluctant to leave their home because of a fear of falling?   No  [place] residence      Prior Function   Level of [place]  Retired    Leisure  walk Community education officer   Overall Cognitive Status  Within Functional Limits for tasks assessed      Posture/Postural Control   Posture/Postural Control  Postural limitations    Postural Limitations  Forward head;Rounded Shoulders      ROM / Strength   AROM / PROM / Strength  AROM;PROM;Strength      Strength   Right Hip Extension  4/5    Right Hip ABduction  3+/5    Left Hip ABduction  3+/5    Left Hip ADduction  4/5      Palpation   Palpation comment  tenderness in lower abdominals                Objective measurements completed on examination: See above findings.    Pelvic Floor Special Questions - 09/29/18 0001    Urinary Leakage  Yes    Pad use  1 panty liner per day    Activities that cause leaking  Walking;Coughing;Sneezing;Laughing;With strong urge    Fecal incontinence  No    Falling out feeling (prolapse)  Yes     Activities that cause feeling of prolapse  standing    Exam Type  Deferred               PT Education - 09/29/18 1304    Education Details  Access Code: [email or web address]    Person(s) Educated  Patient    Methods  Explanation;Demonstration;Verbal cues;Handout    Comprehension  Verbalized understanding;Returned demonstration       PT Short Term Goals - 09/29/18 1352      PT SHORT TERM GOAL #1   Title  independent with initial HEP    Time  4    Period  Weeks    Status  New    Target Date  10/27/18      PT SHORT TERM GOAL #2   Title  able to demostrate correct toileting technique to push the stool out with correct breath and lower abdominal contraction    Baseline  --    Time  4    Period  Weeks    Status  New    Target Date  10/27/18      PT SHORT TERM GOAL #3   Title  able to demonstrate abdominal massage to assist in perstalic motion of large intestines    Time  4    Period  Weeks    Status  New    Target Date  10/27/18        PT Long Term Goals - 09/29/18 1354      PT LONG TERM GOAL #1   Title  independent with HEP and understand how to progress herself    Time  8    Period  Weeks    Status  New    Target Date  11/24/18      PT LONG TERM GOAL #2   Title  able to fully empty her bladder due to improvement of relaxation of the pelvic floor    Time  8    Period  Weeks    Status  New    Target Date  11/24/18      PT LONG TERM GOAL #3   Title  able to fully relax the anal  sphincter muscle to feel like she has fully evacuated the stool.    Time  8    Period  Weeks    Status  New    Target Date  11/24/18      PT LONG TERM GOAL #4   Title  coccy pain decreased >/= 75% prior to a bowel movement    Time  8    Period  Weeks    Status  New    Target Date  11/24/18      PT LONG TERM GOAL #5   Title  --             Plan - 09/29/18 1305    Clinical Impression Statement  Patient is a 73 year old female with issues in consitpation and urinary leakage.  Patient reports difficulty with fully emptying her bladder and bowels. Patient has pain in the lower abdominal and right coccyx area when she has the pressuer to have a bowel movement. Patient deferred for therapist to assess her pelvic floor strength at this time but may decide in the future. Patient has weakness in bilateral hip strength for abduction and extension. Abdominal strength is 2/5. Patient has tenderness located in the lower abdominal area. Patient will benefit from skilled therapy to improve toileting, reduce urinary leakage and assist in fully evacuating bladder and stool.    Personal Factors and Comorbidities  Age;Fitness;Comorbidity 1;Comorbidity 2    Comorbidities  rectocele repair; abdominal hysterectomy    Examination-Activity Limitations  Continence;Toileting    Stability/Clinical Decision Making  Stable/Uncomplicated    Clinical Decision Making  Low    Rehab Potential  Good    PT Frequency  1x / week    PT Duration  8 weeks    PT Treatment/Interventions  Biofeedback;Therapeutic exercise;Patient/family education;Therapeutic activities;Neuromuscular re-education;Manual techniques    PT Next Visit Plan  toileting, hip flexor, hamstring and trunk rotation stretch, pelvic floor contration and bulging    Consulted and Agree with Plan of Care  Patient       Patient will benefit from skilled therapeutic intervention in order to improve the following deficits and impairments:  Decreased coordination, Increased fascial restricitons, Increased muscle spasms, Decreased activity tolerance, Pain, Impaired flexibility, Decreased strength  Visit Diagnosis: Muscle weakness (generalized) - Plan: PT plan of care cert/re-cert  Other lack of coordination - Plan: PT plan of care cert/re-cert  Constipation, unspecified constipation type - Plan: PT plan of care cert/re-cert  Urinary incontinence, unspecified type - Plan: PT plan of care cert/re-cert     Problem List Patient Active Problem  List   Diagnosis Date Noted  . Constipation due to outlet dysfunction   . Precordial chest pain 05/05/2018  . Dyslipidemia 05/05/2018  . 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 H/O urinary frequency 02/09/2018  . Cervicalgia 03/26/2017  . Dysphagia 12/04/2016  . Primary osteoarthritis of both knees 06/03/2016  . Depressive disorder 05/21/2016  . 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 Cough 03/28/2016  . Pituitary insufficiency ([place]) 12/26/2015  . GERD (gastroesophageal reflux disease) 11/06/2015  . IBS (irritable bowel syndrome) 10/16/2015  . Hyperlipidemia 10/16/2015  . Insomnia disorder 10/16/2015  . Anxiety disorder 10/16/2015  . Allergic rhinitis 10/16/2015  . Pituitary adenoma ([place]) 09/26/2015    Earlie Counts, PT 09/29/18 2:01 PM   [facility] [address] [place], [place], [address] Phone: [phone]   Fax:  [phone]  Name:  SHAMORA TAUNTON MRN: [id number] Date of Birth: 04-02-1945

## 2018-10-01 ENCOUNTER — Ambulatory Visit: Payer: Self-pay

## 2018-10-01 ENCOUNTER — Other Ambulatory Visit: Payer: Self-pay

## 2018-10-01 ENCOUNTER — Ambulatory Visit
Admission: RE | Admit: 2018-10-01 | Discharge: 2018-10-01 | Disposition: A | Discharge status: Home / Self Care | Payer: Medicare HMO | Source: Ambulatory Visit | Attending: Family Medicine | Admitting: Family Medicine

## 2018-10-01 DIAGNOSIS — N641 Fat necrosis of breast: Secondary | ICD-10-CM

## 2018-10-09 ENCOUNTER — Other Ambulatory Visit: Payer: Self-pay | Admitting: Family Medicine

## 2018-10-09 DIAGNOSIS — J309 Allergic rhinitis, unspecified: Secondary | ICD-10-CM

## 2018-10-20 ENCOUNTER — Encounter: Payer: Self-pay | Admitting: Physical Therapy

## 2018-10-20 ENCOUNTER — Ambulatory Visit: Payer: Medicare HMO | Attending: Gastroenterology | Admitting: Physical Therapy

## 2018-10-20 ENCOUNTER — Other Ambulatory Visit: Payer: Self-pay

## 2018-10-20 DIAGNOSIS — R32 Unspecified urinary incontinence: Secondary | ICD-10-CM | POA: Insufficient documentation

## 2018-10-20 DIAGNOSIS — K5900 Constipation, unspecified: Secondary | ICD-10-CM | POA: Diagnosis present

## 2018-10-20 DIAGNOSIS — M6281 Muscle weakness (generalized): Secondary | ICD-10-CM | POA: Insufficient documentation

## 2018-10-20 DIAGNOSIS — R278 Other lack of coordination: Secondary | ICD-10-CM | POA: Diagnosis present

## 2018-10-20 NOTE — Patient Instructions (Signed)
Access Code: [email or web address]  URL: [email or web address]  Date: 10/20/2018  Prepared by: Earlie Counts   Exercises Quadruped Rocking Backward - 10 reps - 1 sets - 1x daily - 7x weekly Child's Pose Stretch - 1 reps - 1 sets - 30 sec hold - 1x daily - 7x weekly Seated Piriformis Stretch with Trunk Bend - 2 reps - 1 sets - 30 sec hold - 1x daily - 7x weekly Seated Hamstring Stretch - 2 reps - 1 sets - 30 sec hold - 1x daily - 7x weekly Supine Single Knee to Chest - 2 reps - 1 sets - 30 sec hold - 1x daily - 7x weekly Supine Lower Trunk Rotation - 2 reps - 1 sets - 30 sec hold - 1x daily - 7x weekly Beginner Bridge - 10 reps - 1 sets - 5 sec hold - 1x daily - 7x weekly Supine Hip Adduction Isometric with Ball - 10 reps - 1 sets - 5 sec hold - 1x daily - 7x weekly Hooklying Clamshell with Resistance - 10 reps - 1 sets - 1x daily - 7x weekly Positioning on Toilet and Defecation Technique - 10 reps - 3 sets - 1x daily - 7x weekly [facility] [address] [place], [place] [address] Phone # [phone] Fax [phone]

## 2018-10-20 NOTE — Therapy (Signed)
[facility] [address] [place], [place], [address] Phone: [phone]   Fax:  [phone]  Physical Therapy Treatment  Patient Details  Name: Nancy Herman MRN: [id number] Date of Birth: [birth year] Referring Provider (PT): Dr. Harl Bowie   Encounter Date: 10/20/2018  PT End of Session - 10/20/18 1024    Visit Number  2    Date for PT Re-Evaluation  11/24/18    Authorization Type  [employer]    PT Start Time  [id number]    PT Stop Time  1055    PT Time Calculation (min)  40 min    Activity Tolerance  Patient tolerated treatment well;No increased pain    Behavior During Therapy  WFL for tasks assessed/performed       Past Medical History:  Diagnosis Date  . Anxiety   . Frequent headaches   . GERD (gastroesophageal reflux disease)   . Hyperlipidemia   . IBS (irritable bowel syndrome)   . Pituitary insufficiency ([place])   . Thyroid disease     Past Surgical History:  Procedure Laterality Date  . ABDOMINAL HYSTERECTOMY  1990  . ANAL RECTAL MANOMETRY N/A 08/14/2018   Procedure: ANO RECTAL MANOMETRY;  Surgeon: Mauri Pole, MD;  Location: [facility];  Service: Endoscopy;  Laterality: N/A;  . BRAIN SURGERY    . BREAST EXCISIONAL BIOPSY Right unsure  . BREAST EXCISIONAL BIOPSY Left unsure  . OVARIAN CYST SURGERY  1994  . REPAIR RECTOCELE  2016   and prolapsed uterus.  . TONSILLECTOMY  1973    There were no vitals filed for this visit.  Subjective Assessment - 10/20/18 1021    Subjective  My irritable bowels are bothering me today so my stomach is not feeling well. I have not had pain in the tailbone and hip in awhile. No straining to have a bowel movement. I see my GI doctor next week in person.    Patient Stated Goals  not strain for a bowel movement    Currently in Pain?  No/denies                       [facility] Adult PT Treatment/Exercise - 10/20/18 0001       Therapeutic Activites    Therapeutic Activities  Other Therapeutic Activities    Other Therapeutic Activities  instruction on correct bowel movement with breath, hips above knees, and relaxation of the pelvic floor      Lumbar Exercises: Stretches   Active Hamstring Stretch  Right;Left;1 rep;30 seconds   sitting   Lower Trunk Rotation  2 reps;30 seconds   supine each side   Piriformis Stretch  Right;Left;1 rep;30 seconds   sitting     Lumbar Exercises: Supine   Clam  10 reps;1 second   using a yellow band   Bridge  10 reps;5 seconds    Other Supine Lumbar Exercises  ball squeeze in hookly holding for 5 sec for 10 times             PT Education - 10/20/18 1049    Education Details  Access Code: [email or web address]    Person(s) Educated  Patient    Methods  Explanation;Demonstration;Handout;Verbal cues    Comprehension  Returned demonstration;Verbalized understanding       PT Short Term Goals - 09/29/18 1352      PT SHORT TERM GOAL #1   Title  independent with initial HEP    Time  4  Period  Weeks    Status  New    Target Date  10/27/18      PT SHORT TERM GOAL #2   Title  able to demostrate correct toileting technique to push the stool out with correct breath and lower abdominal contraction    Baseline  --    Time  4    Period  Weeks    Status  New    Target Date  10/27/18      PT SHORT TERM GOAL #3   Title  able to demonstrate abdominal massage to assist in perstalic motion of large intestines    Time  4    Period  Weeks    Status  New    Target Date  10/27/18        PT Long Term Goals - 09/29/18 1354      PT LONG TERM GOAL #1   Title  independent with HEP and understand how to progress herself    Time  8    Period  Weeks    Status  New    Target Date  11/24/18      PT LONG TERM GOAL #2   Title  able to fully empty her bladder due to improvement of relaxation of the pelvic floor    Time  8    Period  Weeks    Status  New    Target Date  11/24/18       PT LONG TERM GOAL #3   Title  able to fully relax the anal sphincter muscle to feel like she has fully evacuated the stool.    Time  8    Period  Weeks    Status  New    Target Date  11/24/18      PT LONG TERM GOAL #4   Title  coccy pain decreased >/= 75% prior to a bowel movement    Time  8    Period  Weeks    Status  New    Target Date  11/24/18      PT LONG TERM GOAL #5   Title  --            Plan - 10/20/18 1025    Clinical Impression Statement  Patient was not having coccyx pain or right hip pain since last visit. Patient learned how to toilet correctly to empty her bowels. Patient reviewed her stretches to improve muscle flexibilty. Patient started to learn pelvic floor strengthening exercise to reduce leakage. Patient will benefit from skilled therapy to improve toileting, reduce urinary leakage and assist in fully evacuating bladder and stool.    Personal Factors and Comorbidities  Age;Fitness;Comorbidity 1;Comorbidity 2    Comorbidities  rectocele repair; abdominal hysterectomy    Examination-Activity Limitations  Continence;Toileting    Stability/Clinical Decision Making  Stable/Uncomplicated    Rehab Potential  Good    PT Frequency  1x / week    PT Duration  8 weeks    PT Treatment/Interventions  Biofeedback;Therapeutic exercise;Patient/family education;Therapeutic activities;Neuromuscular re-education;Manual techniques    PT Next Visit Plan  pelvic floor contration and bulging; bowel health; review past exercises    PT Home Exercise Plan  Access Code: [email or web address]    Recommended Other Services  MD signed initial eval    Consulted and Agree with Plan of Care  Patient       Patient will benefit from skilled therapeutic intervention in order to improve the following deficits and impairments:  Decreased coordination,  Increased fascial restricitons, Increased muscle spasms, Decreased activity tolerance, Pain, Impaired flexibility, Decreased strength  Visit  Diagnosis: Muscle weakness (generalized)  Other lack of coordination  Constipation, unspecified constipation type  Urinary incontinence, unspecified type     Problem List Patient Active Problem List   Diagnosis Date Noted  . Constipation due to outlet dysfunction   . Precordial chest pain 05/05/2018  . Dyslipidemia 05/05/2018  . Major depressive disorder, single episode with psychotic features ([place]) 03/31/2018  . H/O urinary frequency 02/09/2018  . Cervicalgia 03/26/2017  . Dysphagia 12/04/2016  . Primary osteoarthritis of both knees 06/03/2016  . Depressive disorder 05/21/2016  . 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 Cough 03/28/2016  . Pituitary insufficiency ([place]) 12/26/2015  . GERD (gastroesophageal reflux disease) 11/06/2015  . IBS (irritable bowel syndrome) 10/16/2015  . Hyperlipidemia 10/16/2015  . Insomnia disorder 10/16/2015  . Anxiety disorder 10/16/2015  . Allergic rhinitis 10/16/2015  . Pituitary adenoma ([place]) 09/26/2015    Earlie Counts, PT 10/20/18 11:01 AM   [facility] [address], [place], [place], [address] Phone: [phone]   Fax:  [phone]  Name: ADELAINE FICKE MRN: [id number] Date of Birth: 08-05-1945

## 2018-10-28 ENCOUNTER — Other Ambulatory Visit: Payer: Self-pay

## 2018-10-28 ENCOUNTER — Encounter: Payer: Self-pay | Admitting: Gastroenterology

## 2018-10-28 ENCOUNTER — Ambulatory Visit: Payer: Medicare HMO | Admitting: Gastroenterology

## 2018-10-28 VITALS — BP 110/82 | HR 67 | Temp 97.6°F | Ht 63.0 in | Wt 154.0 lb

## 2018-10-28 DIAGNOSIS — K581 Irritable bowel syndrome with constipation: Secondary | ICD-10-CM

## 2018-10-28 DIAGNOSIS — K5902 Outlet dysfunction constipation: Secondary | ICD-10-CM

## 2018-10-28 NOTE — Progress Notes (Signed)
RALPH BRISKEY    [id number]    October 23, 1945  Primary Care Physician:Jordan, Malka So, MD  Referring Physician: Martinique, Betty G, MD [place],  [place] [address]   Chief complaint:  Constipation    HPI: 73 year old female with history of chronic IBS predominant constipation here for follow-up visit.  Anorectal manometry consistent with dyssynergy defecation.  She started pelvic floor physical therapy, has not noticed significant improvement yet. She is taking Miralax 1/2 capful daily and Benefiber once daily.  She is having 1-2 bowel movements daily but continues to feel sensation of incomplete evacuation.  She has incomplete evacuation even during urination. Denies any nausea, vomiting, abdominal pain, melena or bright red blood per rectum  Relevant Hx: She is status post TAH/BSO and also had rectocele repair in 2016 Colonoscopy March 31, 2014 showed internal hemorrhoids, mild proctitis biopsies showed colonic mucosal hyperplasia and fibrosis suggestive of rectal prolapse, tubular adenoma removed from ascending colon EGD March 31, 2014 showed gastritis, duodenitis and small hiatal hernia EGD August 12, 2016 by Dr. Collene Mares showed small hiatal hernia otherwise normal exam  Outpatient Encounter Medications as of 10/28/2018  Medication Sig  . Calcium Carbonate-Vit D-Min (CALCIUM 1200 PO) Take 1 tablet by mouth daily after breakfast.   . FLUoxetine (PROZAC) 20 MG capsule Take 1 capsule (20 mg total) by mouth daily.  . montelukast (SINGULAIR) 10 MG tablet TAKE 1 TABLET BY MOUTH AT BEDTIME  . pantoprazole (PROTONIX) 40 MG tablet Take 1 tablet (40 mg total) by mouth daily.  . polyethylene glycol (MIRALAX) packet Take 17 g by mouth daily. (Patient taking differently: Take 17 g by mouth every other day. )  . pravastatin (PRAVACHOL) 20 MG tablet Take 1 tablet by mouth once daily  . risperiDONE (RISPERDAL) 0.25 MG tablet Take 1 tablet (0.25 mg total) by mouth  2 (two) times daily.  . traZODone (DESYREL) 50 MG tablet Take 1 tablet (50 mg total) by mouth at bedtime.   No facility-administered encounter medications on file as of 10/28/2018.     Allergies as of 10/28/2018 - Review Complete 10/28/2018  Allergen Reaction Noted  . Other Other (See Comments) 10/28/2017    Past Medical History:  Diagnosis Date  . Anxiety   . Frequent headaches   . GERD (gastroesophageal reflux disease)   . Hyperlipidemia   . IBS (irritable bowel syndrome)   . Pituitary insufficiency ([place])   . Thyroid disease     Past Surgical History:  Procedure Laterality Date  . ABDOMINAL HYSTERECTOMY  1990  . ANAL RECTAL MANOMETRY N/A 08/14/2018   Procedure: ANO RECTAL MANOMETRY;  Surgeon: Mauri Pole, MD;  Location: [facility];  Service: Endoscopy;  Laterality: N/A;  . BRAIN SURGERY    . BREAST EXCISIONAL BIOPSY Right unsure  . BREAST EXCISIONAL BIOPSY Left unsure  . OVARIAN CYST SURGERY  1994  . REPAIR RECTOCELE  2016   and prolapsed uterus.  . TONSILLECTOMY  1973    Family History  Problem Relation Age of Onset  . Heart disease Mother        No details   . Alcohol abuse Mother   . Drug abuse Mother   . Sickle cell anemia Mother   . Heart disease Father        No details . Questionable bypass or stent  . Drug abuse Father   . Alcohol abuse Father     Social History   Socioeconomic  History  . Marital status: Single    Spouse name: Not on file  . Number of children: Not on file  . Years of education: Not on file  . Highest education level: Not on file  Occupational History  . Not on file  Social Needs  . Financial resource strain: Not on file  . Food insecurity    Worry: Not on file    Inability: Not on file  . Transportation needs    Medical: Not on file    Non-medical: Not on file  Tobacco Use  . Smoking status: Never Smoker  . Smokeless tobacco: Never Used  Substance and Sexual Activity  . Alcohol use: No  . Drug use: No  .  Sexual activity: Never  Lifestyle  . Physical activity    Days per week: Not on file    Minutes per session: Not on file  . Stress: Not on file  Relationships  . Social Herbalist on phone: Not on file    Gets together: Not on file    Attends religious service: Not on file    Active member of club or organization: Not on file    Attends meetings of clubs or organizations: Not on file    Relationship status: Not on file  . Intimate partner violence    Fear of current or ex partner: Not on file    Emotionally abused: Not on file    Physically abused: Not on file    Forced sexual activity: Not on file  Other Topics Concern  . Not on file  Social History Narrative   Lives alone.  Lives near son.  Was in [place].      Review of systems: Review of Systems  Constitutional: Negative for fever and chills.  HENT: Negative.   Eyes: Negative for blurred vision.  Respiratory: Negative for cough, shortness of breath and wheezing.   Cardiovascular: Negative for chest pain and palpitations.  Gastrointestinal: as per HPI Genitourinary: Negative for dysuria, urgency, frequency and hematuria.  Musculoskeletal: Negative for myalgias, back pain and joint pain.  Skin: Negative for itching and rash.  Neurological: Negative for tremors, focal weakness, seizures and loss of consciousness.  Endo/Heme/Allergies: Negative for seasonal allergies.  Psychiatric/Behavioral: Negative for depression, suicidal ideas and hallucinations.  All other systems reviewed and are negative.   Physical Exam: Vitals:   10/28/18 1058  BP: 110/82  Pulse: 67  Temp: 97.6 F (36.4 C)   Body mass index is 27.28 kg/m. Gen:      No acute distress HEENT:  EOMI, sclera anicteric Neck:     No masses; no thyromegaly Lungs:    Clear to auscultation bilaterally; normal respiratory effort CV:         Regular rate and rhythm; no murmurs Abd:      + bowel sounds; soft, non-tender; no palpable masses, no distension  Ext:    No edema; adequate peripheral perfusion Skin:      Warm and dry; no rash Neuro: alert and oriented x 3 Psych: normal mood and affect  Data Reviewed:  Reviewed labs, radiology imaging, old records and pertinent past GI work up   Assessment and Plan/Recommendations:  73 year old female with history of pituitary adenoma status post resection, depression, hyperlipidemia, TAH/BSO status post rectocele repair, chronic GERD and IBS  Constipation with outlet dysfunction/dyssynergy defecation: Continue pelvic floor physical therapy Benefiber 1 teaspoon 3 times daily with meals Half capful MiraLAX daily and titrate based on response to  have 1-2 soft bowel movements daily  IBS: IBgard 1 capsule up to 3 times daily as needed  Bloating and excessive gas: Trial of lactose-free diet  Due for recall colonoscopy February 2021  GERD: Continue Protonix daily and antireflux measures    25 minutes was spent face-to-face with the patient. Greater than 50% of the time used for counseling as well as treatment plan and follow-up. She had multiple questions which were answered to her satisfaction  K. Denzil Magnuson , MD    CC: Martinique, Betty G, MD

## 2018-10-28 NOTE — Patient Instructions (Signed)
Take benefiber 1 teaspoon three times a day  Take Miralax 1/2 capful daily  Continue pelvic PT  IBGard samples given take 1 three times a day as needed  Follow up in four months   Lactose-Free Diet, Adult If you have lactose intolerance, you are not able to digest lactose. Lactose is a natural sugar found mainly in dairy milk and dairy products. You may need to avoid all foods and beverages that contain lactose. A lactose-free diet can help you do this. Which foods have lactose? Lactose is found in dairy milk and dairy products, such as:  Yogurt.  Cheese.  Butter.  Margarine.  Sour cream.  Cream.  Whipped toppings and nondairy creamers.  Ice cream and other dairy-based desserts. Lactose is also found in foods or products made with dairy milk or milk ingredients. To find out whether a food contains dairy milk or a milk ingredient, look at the ingredients list. Avoid foods with the statement "May contain milk" and foods that contain:  Milk powder.  Whey.  Curd.  Caseinate.  Lactose.  Lactalbumin.  Lactoglobulin. What are alternatives to dairy milk and foods made with milk products?  Lactose-free milk.  Soy milk with added calcium and vitamin D.  Almond milk, coconut milk, rice milk, or other nondairy milk alternatives with added calcium and vitamin D. Note that these are low in protein.  Soy products, such as soy yogurt, soy cheese, soy ice cream, and soy-based sour cream.  Other nut milk products, such as almond yogurt, almond cheese, cashew yogurt, cashew cheese, cashew ice cream, coconut yogurt, and coconut ice cream. What are tips for following this plan?  Do not consume foods, beverages, vitamins, minerals, or medicines containing lactose. Read ingredient lists carefully.  Look for the words "lactose-free" on labels.  Use lactase enzyme drops or tablets as directed by your health care provider.  Use lactose-free milk or a milk alternative, such as  soy milk or almond milk, for drinking and cooking.  Make sure you get enough calcium and vitamin D in your diet. A lactose-free eating plan can be lacking in these important nutrients.  Take calcium and vitamin D supplements as directed by your health care provider. Talk to your health care provider about supplements if you are not able to get enough calcium and vitamin D from food. What foods can I eat?  Fruits All fresh, canned, frozen, or dried fruits that are not processed with lactose. Vegetables All fresh, frozen, and canned vegetables without cheese, cream, or butter sauces. Grains Any that are not made with dairy milk or dairy products. Meats and other proteins Any meat, fish, poultry, and other protein sources that are not made with dairy milk or dairy products. Soy cheese and yogurt. Fats and oils Any that are not made with dairy milk or dairy products. Beverages Lactose-free milk. Soy, rice, or almond milk with added calcium and vitamin D. Fruit and vegetable juices. Sweets and desserts Any that are not made with dairy milk or dairy products. Seasonings and condiments Any that are not made with dairy milk or dairy products. Calcium Calcium is found in many foods that contain lactose and is important for bone health. The amount of calcium you need depends on your age:  Adults younger than 50 years: 1,000 mg of calcium a day.  Adults older than 50 years: 1,200 mg of calcium a day. If you are not getting enough calcium, you may get it from other sources, including:  Orange juice  with calcium added. There are 300-350 mg of calcium in 1 cup of orange juice.  Calcium-fortified soy milk. There are 300-400 mg of calcium in 1 cup of calcium-fortified soy milk.  Calcium-fortified rice or almond milk. There are 300 mg of calcium in 1 cup of calcium-fortified rice or almond milk.  Calcium-fortified breakfast cereals. There are 100-1,000 mg of calcium in calcium-fortified breakfast  cereals.  Spinach, cooked. There are 145 mg of calcium in  cup of cooked spinach.  Edamame, cooked. There are 130 mg of calcium in  cup of cooked edamame.  Collard greens, cooked. There are 125 mg of calcium in  cup of cooked collard greens.  Kale, frozen or cooked. There are 90 mg of calcium in  cup of cooked or frozen kale.  Almonds. There are 95 mg of calcium in  cup of almonds.  Broccoli, cooked. There are 60 mg of calcium in 1 cup of cooked broccoli. The items listed above may not be a complete list of recommended foods and beverages. Contact a dietitian for more options. What foods are not recommended? Fruits None, unless they are made with dairy milk or dairy products. Vegetables None, unless they are made with dairy milk or dairy products. Grains Any grains that are made with dairy milk or dairy products. Meats and other proteins None, unless they are made with dairy milk or dairy products. Dairy All dairy products, including milk, goat's milk, buttermilk, kefir, acidophilus milk, flavored milk, evaporated milk, condensed milk, dulce de [place], eggnog, yogurt, cheese, and cheese spreads. Fats and oils Any that are made with milk or milk products. Margarines and salad dressings that contain milk or cheese. Cream. Half and half. Cream cheese. Sour cream. Chip dips made with sour cream or yogurt. Beverages Hot chocolate. Cocoa with lactose. Instant iced teas. Powdered fruit drinks. Smoothies made with dairy milk or yogurt. Sweets and desserts Any that are made with milk or milk products. Seasonings and condiments Chewing gum that has lactose. Spice blends if they contain lactose. Artificial sweeteners that contain lactose. Nondairy creamers. The items listed above may not be a complete list of foods and beverages to avoid. Contact a dietitian for more information. Summary  If you are lactose intolerant, it means that you have a hard time digesting lactose, a natural  sugar found in milk and milk products.  Following a lactose-free diet can help you manage this condition.  Calcium is important for bone health and is found in many foods that contain lactose. Talk with your health care provider about other sources of calcium. This information is not intended to replace advice given to you by your health care provider. Make sure you discuss any questions you have with your health care provider. Document Released: 07/13/2001 Document Revised: 02/18/2017 Document Reviewed: 02/18/2017 Elsevier Patient Education  2020 [employer].

## 2018-11-02 ENCOUNTER — Other Ambulatory Visit: Payer: Self-pay | Admitting: Family Medicine

## 2018-11-02 DIAGNOSIS — K219 Gastro-esophageal reflux disease without esophagitis: Secondary | ICD-10-CM

## 2018-11-03 ENCOUNTER — Other Ambulatory Visit: Payer: Self-pay

## 2018-11-03 ENCOUNTER — Encounter: Payer: Self-pay | Admitting: Physical Therapy

## 2018-11-03 ENCOUNTER — Ambulatory Visit: Payer: Medicare HMO

## 2018-11-03 ENCOUNTER — Ambulatory Visit: Payer: Medicare HMO | Admitting: Physical Therapy

## 2018-11-03 DIAGNOSIS — R278 Other lack of coordination: Secondary | ICD-10-CM

## 2018-11-03 DIAGNOSIS — M6281 Muscle weakness (generalized): Secondary | ICD-10-CM | POA: Diagnosis not present

## 2018-11-03 DIAGNOSIS — R32 Unspecified urinary incontinence: Secondary | ICD-10-CM

## 2018-11-03 DIAGNOSIS — K5900 Constipation, unspecified: Secondary | ICD-10-CM

## 2018-11-03 NOTE — Patient Instructions (Addendum)
Introduction to Bowel Health Diet and daily habits can help you predict when your bowels will move on a regular basis.  The consistency and quantity of the stool is usually more important than the frequency.  The goal is to have a regular bowel movement that is soft but formed.   Tips on Emptying Regularly . Eat breakfast.  Usually the best time of day for a bowel movement will be a half hour to an hour after eating.  These times are best because the body uses the gastrocolic reflex, a stimulation of bowel motion that occurs with eating, to help produce a bowel movement.  For some people even a simple hot drink in the morning can help the reflex action begin. . Eat all your meals at a predictable time each day.  The bowel functions best when food is introduced at the same regular intervals. . The amount of food eaten at a given time of day should be about the same size from day to day.  The bowel functions best when food is introduced in similar quantities from day to day. It is fine to have a small breakfast and a large lunch, or vice versa, just be consistent. . Eat two servings of fruit or vegetables and at least one serving of a complex carbohydrates (whole grains such as brown rice, bran, whole wheat bread, or oatmeal) at each meal. . Drink plenty of water-ideally eight glasses a day.  Be sure to increase your water intake if you are increasing fiber into your diet. . Drink 64 oz. Of water  Maintain Healthy Habits . Exercise daily.  You may exercise at any time of day, but you may find that bowel function is helped most if the exercise is at a consistent time each day. . Ride your bike 15 min to 20 min. Per day . Make sure that you are not rushed and have convenient access to a bathroom at your selected time to empty your bowels.   Toileting Techniques for Bowel Movements (Defecation) Using your belly (abdomen) and pelvic floor muscles to have a bowel movement is usually instinctive.   Sometimes people can have problems with these muscles and have to relearn proper defecation (emptying) techniques.  If you have weakness in your muscles, organs that are falling out, decreased sensation in your pelvis, or ignore your urge to go, you may find yourself straining to have a bowel movement.  You are straining if you are: . holding your breath or taking in a huge gulp of air and holding it  . keeping your lips and jaw tensed and closed tightly . turning red in the face because of excessive pushing or forcing . developing or worsening your  hemorrhoids . getting faint while pushing . not emptying completely and have to defecate many times a day  If you are straining, you are actually making it harder for yourself to have a bowel movement.  Many people find they are pulling up with the pelvic floor muscles and closing off instead of opening the anus. Due to lack pelvic floor relaxation and coordination the abdominal muscles, one has to work harder to push the feces out.  Many people have never been taught how to defecate efficiently and effectively.  Notice what happens to your body when you are having a bowel movement.  While you are sitting on the toilet pay attention to the following areas: . Jaw and mouth position . Angle of your hips   . Whether  your feet touch the ground or not . Arm placement  . Spine position . Waist . Belly tension . Anus (opening of the anal canal)  An Evacuation/Defecation Plan   Here are the 4 basic points:  1. Lean forward enough for your elbows to rest on your knees 2. Support your feet on the floor or use a low stool if your feet don't touch the floor  3. Push out your belly as if you have swallowed a beach ball-you should feel a widening of your waist; then do your deep Grrrr 4. Open and relax your pelvic floor muscles, rather than tightening around the anus 5. Do not hold your breath see little lights flashing      The following conditions  my require modifications to your toileting posture:  . If you have had surgery in the past that limits your back, hip, pelvic, knee or ankle flexibility . Constipation   Your healthcare practitioner may make the following additional suggestions and adjustments:  1) Sit on the toilet  a) Make sure your feet are supported. b) Notice your hip angle and spine position-most people find it effective to lean forward or raise their knees, which can help the muscles around the anus to relax  c) When you lean forward, place your forearms on your thighs for support  2) Relax suggestions a) Breath deeply in through your nose and out slowly through your mouth as if you are smelling the flowers and blowing out the candles. b) To become aware of how to relax your muscles, contracting and releasing muscles can be helpful.  Pull your pelvic floor muscles in tightly by using the image of holding back gas, or closing around the anus (visualize making a circle smaller) and lifting the anus up and in.  Then release the muscles and your anus should drop down and feel open. Repeat 5 times ending with the feeling of relaxation. c) Keep your pelvic floor muscles relaxed; let your belly bulge out. d) The digestive tract starts at the mouth and ends at the anal opening, so be sure to relax both ends of the tube.  Place your tongue on the roof of your mouth with your teeth separated.  This helps relax your mouth and will help to relax the anus at the same time.  3) Empty (defecation) a) Keep your pelvic floor and sphincter relaxed, then bulge your anal muscles.  Make the anal opening wide.  b) Stick your belly out as if you have swallowed a beach ball. c) Make your belly wall hard using your belly muscles while continuing to breathe. Doing this makes it easier to open your anus. d) Breath out and give a grunt (or try using other sounds such as ahhhh, shhhhh, ohhhh or grrrrrrr).  4) Finish a) As you finish your bowel  movement, pull the pelvic floor muscles up and in.  This will leave your anus in the proper place rather than remaining pushed out and down. If you leave your anus pushed out and down, it will start to feel as though that is normal and give you incorrect signals about needing to have a bowel movement.  Slow Contraction: Gravity Resisted (Sitting)    Sitting, slowly squeeze vagina for _5__ seconds. Rest for _5__ seconds. Repeat _5__ times. Do _3__ times a day.  Copyright  VHI. All rights reserved.   Slow Contraction: Gravity Eliminated (Side-Lying)    Lie on left side, hips and knees slightly bent. Slowly squeeze pelvic  floor for _5__ seconds. Rest for _5__ seconds. Repeat __5_ times. Do _2__ times a day.   Copyright  VHI. All rights reserved.  [facility] [address], [place], [place] [address] Phone # [phone] Fax [phone]

## 2018-11-03 NOTE — Therapy (Signed)
[facility] [address], [place], [place], [address] Phone: [phone]   Fax:  [phone]  Physical Therapy Treatment  Patient Details  Name: Nancy Herman MRN: [id number] Date of Birth: [birth year] Referring Provider (PT): Dr. Harl Bowie   Encounter Date: 11/03/2018  PT End of Session - 11/03/18 1033    Visit Number  3    Date for PT Re-Evaluation  11/24/18    Authorization Type  [employer] Medicare    PT Start Time  [id number]    PT Stop Time  1055    PT Time Calculation (min)  40 min    Activity Tolerance  Patient tolerated treatment well;No increased pain    Behavior During Therapy  WFL for tasks assessed/performed       Past Medical History:  Diagnosis Date  . Anxiety   . Frequent headaches   . GERD (gastroesophageal reflux disease)   . Hyperlipidemia   . IBS (irritable bowel syndrome)   . Pituitary insufficiency ([place])   . Thyroid disease     Past Surgical History:  Procedure Laterality Date  . ABDOMINAL HYSTERECTOMY  1990  . ANAL RECTAL MANOMETRY N/A 08/14/2018   Procedure: ANO RECTAL MANOMETRY;  Surgeon: Mauri Pole, MD;  Location: [facility];  Service: Endoscopy;  Laterality: N/A;  . BRAIN SURGERY    . BREAST EXCISIONAL BIOPSY Right unsure  . BREAST EXCISIONAL BIOPSY Left unsure  . OVARIAN CYST SURGERY  1994  . REPAIR RECTOCELE  2016   and prolapsed uterus.  . TONSILLECTOMY  1973    There were no vitals filed for this visit.  Subjective Assessment - 11/03/18 1027    Subjective  no pain in my tailbone. I have one or more per day. I am straining but completely emptying.    Patient Stated Goals  not strain for a bowel movement    Currently in Pain?  No/denies                       [facility] Adult PT Treatment/Exercise - 11/03/18 0001      Self-Care   Self-Care  Other Self-Care Comments    Other Self-Care Comments   instruction on bowel health      Therapeutic  Activites    Therapeutic Activities  Other Therapeutic Activities    Other Therapeutic Activities  instruction on correct bowel movement with breath, hips above knees, and relaxation of the pelvic floor      Neuro Re-ed    Neuro Re-ed Details   sitting contract the pelvic floor 5 sec and rest 5 sec  just feeling the vagina contract and understand the difference of the anus and vaginal contraction; sidely contraction of the anus 5 sec and understand how to isolate      Lumbar Exercises: Stretches   Piriformis Stretch  Right;Left;1 rep;30 seconds   sitting     Lumbar Exercises: Aerobic   Nustep  level 1 for 7 min while assessing patient             PT Education - 11/03/18 1056    Education Details  information on toileting, bowel health and pelvic floor contraction    Person(s) Educated  Patient    Methods  Explanation;Demonstration;Verbal cues;Handout    Comprehension  Verbalized understanding;Returned demonstration       PT Short Term Goals - 11/03/18 1057      PT SHORT TERM GOAL #1   Title  independent with  initial HEP    Baseline  still needs review    Time  4    Period  Weeks    Status  On-going      PT SHORT TERM GOAL #2   Title  able to demostrate correct toileting technique to push the stool out with correct breath and lower abdominal contraction    Baseline  still needs verbal cues    Time  4    Period  Weeks    Status  On-going    Target Date  10/27/18      PT SHORT TERM GOAL #3   Title  able to demonstrate abdominal massage to assist in perstalic motion of large intestines    Time  4    Period  Weeks    Status  On-going        PT Long Term Goals - 09/29/18 1354      PT LONG TERM GOAL #1   Title  independent with HEP and understand how to progress herself    Time  8    Period  Weeks    Status  New    Target Date  11/24/18      PT LONG TERM GOAL #2   Title  able to fully empty her bladder due to improvement of relaxation of the pelvic floor     Time  8    Period  Weeks    Status  New    Target Date  11/24/18      PT LONG TERM GOAL #3   Title  able to fully relax the anal sphincter muscle to feel like she has fully evacuated the stool.    Time  8    Period  Weeks    Status  New    Target Date  11/24/18      PT LONG TERM GOAL #4   Title  coccy pain decreased >/= 75% prior to a bowel movement    Time  8    Period  Weeks    Status  New    Target Date  11/24/18      PT LONG TERM GOAL #5   Title  --            Plan - 11/03/18 1033    Clinical Impression Statement  Pateint saw her doctor and is doing a bowel regimen to assist in constipation. Patient is able to isolate contraction of the rectum and vagina. Patient was holding her breath with toileting and saw stars and understands that she needs to breath or the pelvic floor muscles will close. Patient will benefit from skilled therapy to improve toileting, reduce urinary leakage and assist in fully evacuating bladder and stool.    Personal Factors and Comorbidities  Age;Fitness;Comorbidity 1;Comorbidity 2    Comorbidities  rectocele repair; abdominal hysterectomy    Examination-Activity Limitations  Continence;Toileting    Stability/Clinical Decision Making  Stable/Uncomplicated    Rehab Potential  Good    PT Frequency  1x / week    PT Duration  8 weeks    PT Treatment/Interventions  Biofeedback;Therapeutic exercise;Patient/family education;Therapeutic activities;Neuromuscular re-education;Manual techniques    PT Next Visit Plan  biofeedback for pelvic floor contraction and bulging; nustep    PT Home Exercise Plan  Access Code: [email or web address]    Consulted and Agree with Plan of Care  Patient       Patient will benefit from skilled therapeutic intervention in order to improve the following deficits and impairments:  Decreased coordination, Increased fascial restricitons, Increased muscle spasms, Decreased activity tolerance, Pain, Impaired flexibility, Decreased  strength  Visit Diagnosis: Muscle weakness (generalized)  Other lack of coordination  Constipation, unspecified constipation type  Urinary incontinence, unspecified type     Problem List Patient Active Problem List   Diagnosis Date Noted  . Constipation due to outlet dysfunction   . Precordial chest pain 05/05/2018  . Dyslipidemia 05/05/2018  . 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 H/O urinary frequency 02/09/2018  . Cervicalgia 03/26/2017  . Dysphagia 12/04/2016  . Primary osteoarthritis of both knees 06/03/2016  . Depressive disorder 05/21/2016  . 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 Cough 03/28/2016  . Pituitary insufficiency ([place]) 12/26/2015  . GERD (gastroesophageal reflux disease) 11/06/2015  . IBS (irritable bowel syndrome) 10/16/2015  . Hyperlipidemia 10/16/2015  . Insomnia disorder 10/16/2015  . Anxiety disorder 10/16/2015  . Allergic rhinitis 10/16/2015  . Pituitary adenoma ([place]) 09/26/2015    Earlie Counts, PT 11/03/18 11:02 AM   [facility] [address] [place], [place], [address] Phone: [phone]   Fax:  [phone]  Name: Nancy Herman MRN: [id number] Date of Birth: 03-01-1945

## 2018-11-04 ENCOUNTER — Other Ambulatory Visit: Payer: Self-pay | Admitting: Family Medicine

## 2018-11-04 DIAGNOSIS — K219 Gastro-esophageal reflux disease without esophagitis: Secondary | ICD-10-CM

## 2018-11-09 ENCOUNTER — Ambulatory Visit: Payer: Medicare HMO | Admitting: Family Medicine

## 2018-11-09 ENCOUNTER — Ambulatory Visit (INDEPENDENT_AMBULATORY_CARE_PROVIDER_SITE_OTHER): Payer: Medicare HMO | Admitting: Family Medicine

## 2018-11-09 ENCOUNTER — Other Ambulatory Visit: Payer: Self-pay

## 2018-11-09 ENCOUNTER — Encounter: Payer: Self-pay | Admitting: Family Medicine

## 2018-11-09 VITALS — BP 112/72 | HR 74 | Temp 97.6°F | Resp 16 | Ht 63.0 in | Wt 154.2 lb

## 2018-11-09 DIAGNOSIS — Z0000 Encounter for general adult medical examination without abnormal findings: Secondary | ICD-10-CM | POA: Diagnosis not present

## 2018-11-09 DIAGNOSIS — F99 Mental disorder, not otherwise specified: Secondary | ICD-10-CM

## 2018-11-09 DIAGNOSIS — E785 Hyperlipidemia, unspecified: Secondary | ICD-10-CM | POA: Diagnosis not present

## 2018-11-09 DIAGNOSIS — Z23 Encounter for immunization: Secondary | ICD-10-CM | POA: Diagnosis not present

## 2018-11-09 DIAGNOSIS — F5105 Insomnia due to other mental disorder: Secondary | ICD-10-CM | POA: Diagnosis not present

## 2018-11-09 DIAGNOSIS — E876 Hypokalemia: Secondary | ICD-10-CM

## 2018-11-09 DIAGNOSIS — R739 Hyperglycemia, unspecified: Secondary | ICD-10-CM

## 2018-11-09 DIAGNOSIS — F323 Major depressive disorder, single episode, severe with psychotic features: Secondary | ICD-10-CM

## 2018-11-09 LAB — LIPID PANEL
Cholesterol: 207 mg/dL — ABNORMAL HIGH (ref 0–200)
HDL: 74.4 mg/dL (ref >39.00)
LDL Cholesterol: 116 mg/dL — ABNORMAL HIGH (ref 0–99)
NonHDL: 132.7
Total CHOL/HDL Ratio: 3
Triglycerides: 85 mg/dL (ref 0.0–149.0)
VLDL: 17 mg/dL (ref 0.0–40.0)

## 2018-11-09 LAB — COMPREHENSIVE METABOLIC PANEL
ALT: 9 U/L (ref 0–35)
AST: 14 U/L (ref 0–37)
Albumin: 4.1 g/dL (ref 3.5–5.2)
Alkaline Phosphatase: 58 U/L (ref 39–117)
BUN: 15 mg/dL (ref 6–23)
CO2: 30 mEq/L (ref 19–32)
Calcium: 9.6 mg/dL (ref 8.4–10.5)
Chloride: 104 mEq/L (ref 96–112)
Creatinine, Ser: 1.05 mg/dL (ref 0.40–1.20)
GFR: 62.47 mL/min (ref >60.00)
Glucose, Bld: 81 mg/dL (ref 70–99)
Potassium: 4.4 mEq/L (ref 3.5–5.1)
Sodium: 141 mEq/L (ref 135–145)
Total Bilirubin: 0.4 mg/dL (ref 0.2–1.2)
Total Protein: 6.7 g/dL (ref 6.0–8.3)

## 2018-11-09 LAB — HEMOGLOBIN A1C: Hgb A1c MFr Bld: 5.9 % (ref 4.6–6.5)

## 2018-11-09 NOTE — Progress Notes (Addendum)
HPI:   Nancy Herman is a 73 y.o. female, who is here today for chronic disease management and Medicare preventive visit. She was last seen on 08/31/18. No new problems since her last visit.  Last AWV 10/28/17. She lives alone. Her son lives in town and calls her daily. In general she tries to follow a healthier diet, a few limitations due to her IBS. She recently got a poppy and has been walking daily outdoors.  Independent ADL's and IADL's. No falls in the past year and denies depression symptoms.  Functional Status Survey: Is the patient deaf or have difficulty hearing?: No Does the patient have difficulty seeing, even when wearing glasses/contacts?: No Does the patient have difficulty concentrating, remembering, or making decisions?: No Does the patient have difficulty walking or climbing stairs?: No Does the patient have difficulty dressing or bathing?: No Does the patient have difficulty doing errands alone such as visiting a doctor's office or shopping?: No  Fall Risk  11/09/2018 12/03/2017 10/28/2017 10/24/2016  Falls in the past year? 0 No No No  Number falls in past yr: 0 - - -  Injury with Fall? 0 - - -  Follow up Education provided - - -    Providers she sees regularly: Eye care provider: Dr Gershon Crane, last seen in 04/2018. Endocrinologist, Dr Loanne Drilling. Hx of pituitary insufficiency, s/p pituitary adenoma resection in 2017. She has periodic brain MRI's. She has been off of Levothyroxine since early this year. She follows q 6 months. Psychiatrists, Dr Casimiro Needle Gastroenterologist, Dr Silverio Decamp.   Depression screen [facility] 2/9 11/09/2018  Decreased Interest 0  Down, Depressed, Hopeless 0  PHQ - 2 Score 0  Altered sleeping -  Tired, decreased energy -  Change in appetite -  Feeling bad or failure about yourself  -  Trouble concentrating -  Moving slowly or fidgety/restless -  Suicidal thoughts -  PHQ-9 Score -   Mini-Cog - 11/09/18 2107    Normal clock  drawing test?  yes    How many words correct?  3        Hearing Screening   125Hz  250Hz  500Hz  1000Hz  2000Hz  3000Hz  4000Hz  6000Hz  8000Hz   Right ear:           Left ear:             Visual Acuity Screening   Right eye Left eye Both eyes  Without correction: 20/25 20/40 20/30   With correction:       Mildly abnormal BMP in 03/2018. She has not noted decreased urine output, gross hematuria,or foam in urine. No Hx of HTN.  Lab Results  Component Value Date   CREATININE 1.32 (H) 03/16/2018   BUN 10 03/16/2018   NA 141 03/16/2018   K 3.4 (L) 03/16/2018   CL 108 03/16/2018   CO2 23 03/16/2018  Glucose mildly elevated, 115. 12/2017 HgA1C 5.8.  Denies polydipsia,polyuria, or polyphagia.  HLD: She is on Pravastatin 20 mg daily. Tolerating medication well.  Lab Results  Component Value Date   CHOL 216 (H) 12/08/2017   HDL 80.80 12/08/2017   LDLCALC 122 (H) 12/08/2017   TRIG 68.0 12/08/2017   CHOLHDL 3 12/08/2017   Depression with psychotic episode. She is following with psychiatrist every 3-4 months. She also sees psychotherapist but has not had meetings since [place] 19 pandemia. Denies having hallucinations. Sleeping well, 8 hours. She is not longer taking Trazodone.  She is on Fluoxetine 20 mg daily and Risperdal 0.25 mg  bid.  Denies depressed mood or suicidal thoughts. She also takes naps during the day, around 2:30 pm and for an hours. It does not affect her sleep at night.  IBS: Follows with GI. Symptoms have improved since she is doing pelvic floor exercises. She takes Miralax daily prn. GERD,she is on Protonix 40 mg daily.   Review of Systems  Constitutional: Negative for activity change, appetite change, fatigue and fever.  HENT: Negative for mouth sores, nosebleeds and trouble swallowing.   Eyes: Negative for redness and visual disturbance.  Respiratory: Negative for cough, shortness of breath and wheezing.   Cardiovascular: Negative for chest pain,  palpitations and leg swelling.  Gastrointestinal: Negative for abdominal pain, nausea and vomiting.       Negative for changes in bowel habits.  Genitourinary: Negative for difficulty urinating and dysuria.  Musculoskeletal: Negative for gait problem and myalgias.  Skin: Negative for rash.  Neurological: Negative for syncope, weakness and headaches.  Psychiatric/Behavioral: Negative for confusion. The patient is nervous/anxious.   Rest see pertinent positives and negatives per HPI.   Current Outpatient Medications on File Prior to Visit  Medication Sig Dispense Refill  . Calcium Carbonate-Vit D-Min (CALCIUM 1200 PO) Take 1 tablet by mouth daily after breakfast.     . FLUoxetine (PROZAC) 20 MG capsule Take 1 capsule (20 mg total) by mouth daily. 30 capsule 5  . montelukast (SINGULAIR) 10 MG tablet TAKE 1 TABLET BY MOUTH AT BEDTIME 90 tablet 0  . pantoprazole (PROTONIX) 40 MG tablet Take 1 tablet by mouth once daily 30 tablet 0  . polyethylene glycol (MIRALAX) packet Take 17 g by mouth daily. (Patient taking differently: Take 17 g by mouth every other day. ) 14 each 0  . pravastatin (PRAVACHOL) 20 MG tablet Take 1 tablet by mouth once daily 90 tablet 0  . risperiDONE (RISPERDAL) 0.25 MG tablet Take 1 tablet (0.25 mg total) by mouth 2 (two) times daily. 60 tablet 5  . traZODone (DESYREL) 50 MG tablet Take 1 tablet (50 mg total) by mouth at bedtime. 30 tablet 5   No current facility-administered medications on file prior to visit.      Past Medical History:  Diagnosis Date  . Anxiety   . Frequent headaches   . GERD (gastroesophageal reflux disease)   . Hyperlipidemia   . IBS (irritable bowel syndrome)   . Pituitary insufficiency ([place])   . Thyroid disease    Allergies  Allergen Reactions  . Other Other (See Comments)    Seasonal allergies    Social History   Socioeconomic History  . Marital status: Single    Spouse name: Not on file  . Number of children: Not on file  .  Years of education: Not on file  . Highest education level: Not on file  Occupational History  . Not on file  Social Needs  . Financial resource strain: Not on file  . Food insecurity    Worry: Not on file    Inability: Not on file  . Transportation needs    Medical: Not on file    Non-medical: Not on file  Tobacco Use  . Smoking status: Never Smoker  . Smokeless tobacco: Never Used  Substance and Sexual Activity  . Alcohol use: No  . Drug use: No  . Sexual activity: Never  Lifestyle  . Physical activity    Days per week: Not on file    Minutes per session: Not on file  . Stress: Not on file  Relationships  . Social Herbalist on phone: Not on file    Gets together: Not on file    Attends religious service: Not on file    Active member of club or organization: Not on file    Attends meetings of clubs or organizations: Not on file    Relationship status: Not on file  Other Topics Concern  . Not on file  Social History Narrative   Lives alone.  Lives near son.  Was in [place].    Vitals:   11/09/18 1134  BP: 112/72  Pulse: 74  Resp: 16  Temp: 97.6 F (36.4 C)  SpO2: 97%   Body mass index is 27.32 kg/m.    Physical Exam  Nursing note and vitals reviewed. Constitutional: She is oriented to person, place, and time. She appears well-developed. No distress.  HENT:  Head: Normocephalic and atraumatic.  Mouth/Throat: Oropharynx is clear and moist and mucous membranes are normal.  Eyes: Pupils are equal, round, and reactive to light. Conjunctivae are normal.  Cardiovascular: Normal rate and regular rhythm.  No murmur heard. Pulses:      Dorsalis pedis pulses are 2+ on the right side and 2+ on the left side.  Respiratory: Effort normal and breath sounds normal. No respiratory distress.  GI: Soft. She exhibits no mass. There is no hepatomegaly. There is no abdominal tenderness.  Musculoskeletal:        General: No edema.  Lymphadenopathy:    She has no  cervical adenopathy.  Neurological: She is alert and oriented to person, place, and time. She has normal strength. No cranial nerve deficit. Gait normal.  Skin: Skin is warm. No rash noted. No erythema.  Psychiatric: She has a normal mood and affect.  Well groomed, good eye contact.    ASSESSMENT AND PLAN:  Nancy Herman was seen today for medicare wellness and follow-up.  Diagnoses and all orders for this visit:  Orders Placed This Encounter  Procedures  . Flu Vaccine QUAD High Dose(Fluad)  . Comprehensive metabolic panel  . Lipid panel  . Hemoglobin A1c   Lab Results  Component Value Date   HGBA1C 5.9 11/09/2018   Lab Results  Component Value Date   CREATININE 1.05 11/09/2018   BUN 15 11/09/2018   NA 141 11/09/2018   K 4.4 11/09/2018   CL 104 11/09/2018   CO2 30 11/09/2018   Lab Results  Component Value Date   ALT 9 11/09/2018   AST 14 11/09/2018   ALKPHOS 58 11/09/2018   BILITOT 0.4 11/09/2018   Lab Results  Component Value Date   CHOL 207 (H) 11/09/2018   HDL 74.40 11/09/2018   LDLCALC 116 (H) 11/09/2018   TRIG 85.0 11/09/2018   CHOLHDL 3 11/09/2018    Medicare annual wellness visit, subsequent We discussed the importance of staying active, physically and mentally, as well as the benefits of a healthy/balance diet. Low impact exercise that involve stretching and strengthing are ideal. Vaccines up to date. We discussed preventive screening for the next 5-10 years, summery of recommendations given in AVS. Colonoscopy done in 04/2014 Mammogram 10/01/18. DEXA: 10/2016.  Advance directives and end of life discussed, she has POA and living will.  Insomnia due to other mental disorder Improved. Good sleep hygiene to continue.  Hyperlipidemia, unspecified hyperlipidemia type Continue Pravastatin 20 mg daily and low fat diet. Further recommendations will be given according to lab results.  -     Comprehensive metabolic panel -  Lipid  panel  Hypokalemia K+ rich diet for now. Further recommendations will be given according to lab results.  Hyperglycemia Healthy lifestyle for primary prevention.  -     Hemoglobin A1c  Need for immunization against influenza -     Flu Vaccine QUAD High Dose(Fluad)  Major depressive disorder, single episode with psychotic features ([place]) Well controlled with current management. Continue following with psychiatrist.   Return in about 6 months (around 05/10/2019) for CPE .   -Ms. AKILAH LUKASIK was advised to return sooner than planned today if new concerns arise.     Ahtziry Saathoff G. [place], MD  [facility]. [place] office.

## 2018-11-09 NOTE — Patient Instructions (Addendum)
  Ms. Gotcher , Thank you for taking time to come for your Medicare Wellness Visit. I appreciate your ongoing commitment to your health goals. Please review the following plan we discussed and let me know if I can assist you in the future.   These are the goals we discussed: Goals    . Exercise 150 min/wk Moderate Activity     Will start back walking At least 50 three days a week     . Exercise 150 minutes per week (moderate activity)     Will start walking Find your schedule; get active  Be social some of the time         This is a list of the screening recommended for you and due dates:  Health Maintenance  Topic Date Due  . Tetanus Vaccine  08/20/1966  . Pneumonia vaccines (2 of 2 - PPSV23) 11/24/2018*  . Mammogram  09/30/2020  . Colon Cancer Screening  04/04/2024  . Flu Shot  Completed  . DEXA scan (bone density measurement)  Completed  .  Hepatitis C: One time screening is recommended by Center for Disease Control  (CDC) for  adults born from [birth year] through 1963.   Completed  *Topic was postponed. The date shown is not the original due date.   A few things to remember from today's visit:   Medicare annual wellness visit, subsequent  Insomnia due to other mental disorder  Hyperlipidemia, unspecified hyperlipidemia type  Hypokalemia  Pituitary adenoma ([place]), Chronic   A few tips:  -As we age balance is not as good as it was, so there is a higher risks for falls. Please remove small rugs and furniture that is "in your way" and could increase the risk of falls. Stretching exercises may help with fall prevention: Yoga and Tai Chi are some examples. Low impact exercise is better, so you are not very achy the next day.  -Sun screen and avoidance of direct sun light recommended. Caution with dehydration, if working outdoors be sure to drink enough fluids.  - Some medications are not safe as we age, increases the risk of side effects and can potentially interact with other  medication you are also taken;  including some of over the counter medications. Be sure to let me know when you start a new medication even if it is a dietary/vitamin supplement.   -Healthy diet low in red meet/animal fat and sugar + regular physical activity is recommended.    Screening schedule for the next 5-10 years:  Colonoscopy Glaucoma screening/eye exam every 1-2 years. Mammogram for breast cancer screening annually. Flu vaccine annually. Diabetes screening  Fall prevention

## 2018-11-10 ENCOUNTER — Encounter: Payer: Self-pay | Admitting: Family Medicine

## 2018-11-17 ENCOUNTER — Other Ambulatory Visit: Payer: Self-pay

## 2018-11-17 ENCOUNTER — Encounter: Payer: Self-pay | Admitting: Physical Therapy

## 2018-11-17 ENCOUNTER — Ambulatory Visit: Payer: Medicare HMO | Attending: Gastroenterology | Admitting: Physical Therapy

## 2018-11-17 DIAGNOSIS — M6281 Muscle weakness (generalized): Secondary | ICD-10-CM | POA: Insufficient documentation

## 2018-11-17 DIAGNOSIS — R32 Unspecified urinary incontinence: Secondary | ICD-10-CM | POA: Diagnosis present

## 2018-11-17 DIAGNOSIS — K5900 Constipation, unspecified: Secondary | ICD-10-CM | POA: Diagnosis present

## 2018-11-17 DIAGNOSIS — R278 Other lack of coordination: Secondary | ICD-10-CM

## 2018-11-17 NOTE — Patient Instructions (Addendum)
Slow Contraction: Gravity Resisted (Sitting)    Sitting, slowly squeeze vagina for _10__ seconds. Rest for _5__ seconds. Repeat _5__ times. Do _3__ times a day.  Copyright  VHI. All rights reserved.   Slow Contraction: Gravity Eliminated (Side-Lying)    Lie on  side, hips and knees slightly bent. Slowly squeeze pelvic floor for _10_ seconds. Rest for _5__ seconds. Repeat __10_ times. Do _2__ times a day.   [facility] [address], [place], [place] [address] Phone # [phone] Fax [phone]   Access Code: [email or web address]  URL: [email or web address]  Date: 11/17/2018  Prepared by: Earlie Counts   Exercises Quadruped Rocking Backward - 10 reps - 1 sets - 1x daily - 7x weekly Child's Pose Stretch - 1 reps - 1 sets - 30 sec hold - 1x daily - 7x weekly Seated Piriformis Stretch with Trunk Bend - 2 reps - 1 sets - 30 sec hold - 1x daily - 7x weekly Seated Hamstring Stretch - 2 reps - 1 sets - 30 sec hold - 1x daily - 7x weekly Supine Single Knee to Chest - 2 reps - 1 sets - 30 sec hold - 1x daily - 7x weekly Supine Lower Trunk Rotation - 2 reps - 1 sets - 30 sec hold - 1x daily - 7x weekly Hooklying Clamshell with Resistance - 10 reps - 1 sets - 1x daily - 7x weekly Supine Bridge with Mini Swiss Ball Between Knees - 15 reps - 1 sets - 1x daily - 7x weekly Positioning on Toilet and Defecation Technique - 10 reps - 3 sets - 1x daily - 7x weekly

## 2018-11-17 NOTE — Therapy (Signed)
[facility] [address], [place], [place], [address] Phone: [phone]   Fax:  [phone]  Physical Therapy Treatment  Patient Details  Name: MANJU KULKARNI MRN: [id number] Date of Birth: [birth year] Referring Provider (PT): Dr. Harl Bowie   Encounter Date: 11/17/2018  PT End of Session - 11/17/18 1053    Visit Number  4    Date for PT Re-Evaluation  11/24/18    Authorization Type  [employer] Medicare    PT Start Time  2671    PT Stop Time  2458    PT Time Calculation (min)  38 min    Activity Tolerance  Patient tolerated treatment well;No increased pain    Behavior During Therapy  WFL for tasks assessed/performed       Past Medical History:  Diagnosis Date  . Anxiety   . Frequent headaches   . GERD (gastroesophageal reflux disease)   . Hyperlipidemia   . IBS (irritable bowel syndrome)   . Pituitary insufficiency ([place])   . Thyroid disease     Past Surgical History:  Procedure Laterality Date  . ABDOMINAL HYSTERECTOMY  1990  . ANAL RECTAL MANOMETRY N/A 08/14/2018   Procedure: ANO RECTAL MANOMETRY;  Surgeon: Mauri Pole, MD;  Location: [facility];  Service: Endoscopy;  Laterality: N/A;  . BRAIN SURGERY    . BREAST EXCISIONAL BIOPSY Right unsure  . BREAST EXCISIONAL BIOPSY Left unsure  . OVARIAN CYST SURGERY  1994  . REPAIR RECTOCELE  2016   and prolapsed uterus.  . TONSILLECTOMY  1973    There were no vitals filed for this visit.  Subjective Assessment - 11/17/18 1020    Subjective  I have not had tailbone pain. Stools coming along good. Patient tries to go to the bathroom in the morning to get on a routine. I have not had to strain to have a bowel movement.    Patient Stated Goals  not strain for a bowel movement    Currently in Pain?  No/denies         [facility] PT Assessment - 11/17/18 0001      Assessment   Medical Diagnosis  K58.1 Irritable bowel syndrome with constipation;  M62.89 Pelvic floor dysfunction    Referring Provider (PT)  Dr. Harl Bowie    Onset Date/Surgical Date  --   chronic   Prior Therapy  none      Precautions   Precautions  None      Restrictions   Weight Bearing Restrictions  No      Home Environment   Living Environment  Private residence      Prior Function   Level of Independence  Independent    Vocation  Retired    Leisure  walk Community education officer   Overall Cognitive Status  Within Functional Limits for tasks assessed      Posture/Postural Control   Posture/Postural Control  No significant limitations      Strength   Right Hip Extension  5/5    Right Hip ABduction  5/5    Left Hip ABduction  5/5    Left Hip ADduction  5/5      Palpation   Palpation comment  no tenderness in the lower abdominals                Pelvic Floor Special Questions - 11/17/18 0001    Urinary Leakage  No    Fecal incontinence  No  Exam Type  Deferred        [facility] Adult PT Treatment/Exercise - 11/17/18 0001      Neuro Re-ed    Neuro Re-ed Details   sidely contracting the pelvic floor for 10 seconds 10 times; sitting pelvic floor contraction 10 seconds for 5 times      Lumbar Exercises: Stretches   Active Hamstring Stretch  Right;Left;1 rep;30 seconds   sitting   Single Knee to Chest Stretch  Right;Left;1 rep;30 seconds    Lower Trunk Rotation  2 reps;30 seconds   supine each side   Quadruped Mid Back Stretch  1 rep;30 seconds    Piriformis Stretch  Right;Left;1 rep;30 seconds   sitting   Other Lumbar Stretch Exercise  quadruped rocking back and forth      Lumbar Exercises: Supine   Clam  15 reps;1 second   red band   Bridge with [employer]  15 reps;1 second             PT Education - 11/17/18 1034    Education Details  pelvic floor contraction sitting and sidely;Access Code: [email or web address]    Person(s) Educated  Patient    Methods  Explanation;Demonstration;Verbal cues;Handout    Comprehension   Verbalized understanding;Returned demonstration       PT Short Term Goals - 11/17/18 1022      PT SHORT TERM GOAL #1   Title  independent with initial HEP    Time  4    Period  Weeks    Status  Achieved      PT SHORT TERM GOAL #2   Title  able to demostrate correct toileting technique to push the stool out with correct breath and lower abdominal contraction    Time  4    Period  Weeks    Status  Achieved      PT SHORT TERM GOAL #3   Title  able to demonstrate abdominal massage to assist in perstalic motion of large intestines    Time  4    Period  Weeks    Status  Achieved        PT Long Term Goals - 11/17/18 1023      PT LONG TERM GOAL #1   Title  independent with HEP and understand how to progress herself    Time  8    Period  Weeks    Status  Achieved      PT LONG TERM GOAL #2   Title  able to fully empty her bladder due to improvement of relaxation of the pelvic floor    Baseline  --    Time  8    Period  Weeks    Status  Achieved      PT LONG TERM GOAL #3   Title  able to fully relax the anal sphincter muscle to feel like she has fully evacuated the stool.    Time  8    Period  Weeks    Status  Achieved      PT LONG TERM GOAL #4   Title  coccy pain decreased >/= 75% prior to a bowel movement    Time  8    Period  Weeks    Status  Achieved            Plan - 11/17/18 1053    Clinical Impression Statement  Patient has met all of her goals. Patient has increased strength of bilateral hips. Patient is not having urinary and  fecal leakage. Patient is going to the bathroom regularly. Patient is abl to fully empty her bladder and stool. Patient is independent with HEP and ready for discharge.    Personal Factors and Comorbidities  Age;Fitness;Comorbidity 1;Comorbidity 2    Comorbidities  rectocele repair; abdominal hysterectomy    Examination-Activity Limitations  Continence;Toileting    Stability/Clinical Decision Making  Stable/Uncomplicated    Rehab  Potential  Good    PT Treatment/Interventions  Biofeedback;Therapeutic exercise;Patient/family education;Therapeutic activities;Neuromuscular re-education;Manual techniques    PT Next Visit Plan  Discharge to HEP this visit    PT Home Exercise Plan  Access Code: [email or web address]    Consulted and Agree with Plan of Care  Patient       Patient will benefit from skilled therapeutic intervention in order to improve the following deficits and impairments:  Decreased coordination, Increased fascial restricitons, Increased muscle spasms, Decreased activity tolerance, Pain, Impaired flexibility, Decreased strength  Visit Diagnosis: Muscle weakness (generalized)  Other lack of coordination  Constipation, unspecified constipation type  Urinary incontinence, unspecified type     Problem List Patient Active Problem List   Diagnosis Date Noted  . Constipation due to outlet dysfunction   . Precordial chest pain 05/05/2018  . Dyslipidemia 05/05/2018  . 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 H/O urinary frequency 02/09/2018  . Cervicalgia 03/26/2017  . Dysphagia 12/04/2016  . Primary osteoarthritis of both knees 06/03/2016  . Depressive disorder 05/21/2016  . 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 Cough 03/28/2016  . Pituitary insufficiency ([place]) 12/26/2015  . GERD (gastroesophageal reflux disease) 11/06/2015  . IBS (irritable bowel syndrome) 10/16/2015  . Hyperlipidemia 10/16/2015  . Insomnia disorder 10/16/2015  . Anxiety disorder 10/16/2015  . Allergic rhinitis 10/16/2015  . Pituitary adenoma ([place]) 09/26/2015    Earlie Counts, PT 11/17/18 10:56 AM   [facility] [address], [place], [place], [address] Phone: [phone]   Fax:  [phone]  Name: ANBERLIN DIEZ MRN: [id number] Date of Birth: 10/03/1945  PHYSICAL THERAPY DISCHARGE  SUMMARY  Visits from Start of Care: 4  Current functional level related to goals / functional outcomes: See above.    Remaining deficits: See above.    Education / Equipment: HEP  Plan: Patient agrees to discharge.  Patient goals were met. Patient is being discharged due to meeting the stated rehab goals. Thank you for the referral. Earlie Counts, PT 11/17/18 10:57 AM   ?????

## 2018-11-20 ENCOUNTER — Other Ambulatory Visit: Payer: Self-pay

## 2018-11-20 ENCOUNTER — Ambulatory Visit (INDEPENDENT_AMBULATORY_CARE_PROVIDER_SITE_OTHER): Payer: Medicare HMO | Admitting: Psychiatry

## 2018-11-20 DIAGNOSIS — F411 Generalized anxiety disorder: Secondary | ICD-10-CM | POA: Diagnosis not present

## 2018-11-20 DIAGNOSIS — F329 Major depressive disorder, single episode, unspecified: Secondary | ICD-10-CM

## 2018-11-20 DIAGNOSIS — F32A Depression, unspecified: Secondary | ICD-10-CM

## 2018-11-20 DIAGNOSIS — F325 Major depressive disorder, single episode, in full remission: Secondary | ICD-10-CM

## 2018-11-20 MED ORDER — FLUOXETINE HCL 20 MG PO CAPS: 20.0000 mg | ORAL_CAPSULE | Freq: Every day | ORAL | 5 refills | Status: DC

## 2018-11-20 MED ORDER — RISPERIDONE 0.25 MG PO TABS: 0.2500 mg | ORAL_TABLET | Freq: Two times a day (BID) | ORAL | 5 refills | Status: DC

## 2018-11-20 NOTE — Progress Notes (Signed)
Psychiatric Initial Adult Assessment   Patient Identification: Nancy Herman MRN:  [id number] Date of Evaluation:  11/20/2018 Referral Source: Dr. Betty Martinique Chief Complaint:   Visit Diagnosis:    ICD-10-CM   1. Depressive disorder  F32.9 FLUoxetine (PROZAC) 20 MG capsule  2. Generalized anxiety disorder  F41.1 FLUoxetine (PROZAC) 20 MG capsule    History of Present Illness:   Today the patient is doing well.  She recently got a puppy.  The patient is handling the viral pandemic fairly well.  She generally does have a quiet sedentary lifestyle and her lifestyle really has not changed.  Generally she is physically healthy.  Financially she is stable.  Patient does have irritable bowel but it seems to be well controlled.  The patient is sleeping very well.  She only takes trazodone rarely on a as needed basis.  She is eating well and has good energy.  She has no problems thinking and concentrating.  She is not suicidal.  Patient shares that she is on Risperdal because in March she was hospitalized at [facility] with hallucinations.  She was begun on Risperdal and has been taking it ever since.  Patient says she is having no problems with it.  She has no falls.  The patient likes where she is living and seems to be socially very stable.  She does all her basic ADLs and all of her institutional ADLs.  She continues to drive without a problem.  She is functioning reasonably well.   Associated Signs/Symptoms: Depression Symptoms:  difficulty concentrating, (Hypo) Manic Symptoms:   Anxiety Symptoms:   Psychotic Symptoms:   PTSD Symptoms:   Past Psychiatric History: Prozac for many years  Previous Psychotropic Medications:   Substance Abuse History in the last 12 months:  No.  Consequences of Substance Abuse:   Past Medical History:  Past Medical History:  Diagnosis Date  . Anxiety   . Frequent headaches   . GERD (gastroesophageal reflux disease)   . Hyperlipidemia   . IBS  (irritable bowel syndrome)   . Pituitary insufficiency ([place])   . Thyroid disease     Past Surgical History:  Procedure Laterality Date  . ABDOMINAL HYSTERECTOMY  1990  . ANAL RECTAL MANOMETRY N/A 08/14/2018   Procedure: ANO RECTAL MANOMETRY;  Surgeon: Mauri Pole, MD;  [facility];  Service: Endoscopy;  Laterality: N/A;  . BRAIN SURGERY    . BREAST EXCISIONAL BIOPSY Right unsure  . BREAST EXCISIONAL BIOPSY Left unsure  . OVARIAN CYST SURGERY  1994  . REPAIR RECTOCELE  2016   and prolapsed uterus.  . TONSILLECTOMY  1973    Family Psychiatric History:   Family History:  Family History  Problem Relation Age of Onset  . Heart disease Mother        No details   . Alcohol abuse Mother   . Drug abuse Mother   . Sickle cell anemia Mother   . Heart disease Father        No details . Questionable bypass or stent  . Drug abuse Father   . Alcohol abuse Father     Social History:   Social History   Socioeconomic History  . Marital status: Single    Spouse name: Not on file  . Number of children: Not on file  . Years of education: Not on file  . Highest education level: Not on file  Occupational History  . Not on file  Social Needs  . Financial resource strain:  Not on file  . Food insecurity    Worry: Not on file    Inability: Not on file  . Transportation needs    Medical: Not on file    Non-medical: Not on file  Tobacco Use  . Smoking status: Never Smoker  . Smokeless tobacco: Never Used  Substance and Sexual Activity  . Alcohol use: No  . Drug use: No  . Sexual activity: Never  Lifestyle  . Physical activity    Days per week: Not on file    Minutes per session: Not on file  . Stress: Not on file  Relationships  . Social Herbalist on phone: Not on file    Gets together: Not on file    Attends religious service: Not on file    Active member of club or organization: Not on file    Attends meetings of clubs or organizations: Not on  file    Relationship status: Not on file  Other Topics Concern  . Not on file  Social History Narrative   Lives alone.  Lives near son.  Was in [place].    Additional Social History:   Allergies:   Allergies  Allergen Reactions  . Other Other (See Comments)    Seasonal allergies    Metabolic Disorder Labs: Lab Results  Component Value Date   HGBA1C 5.9 11/09/2018   Lab Results  Component Value Date   PROLACTIN 5.0 03/04/2018   PROLACTIN 8.8 09/26/2015   Lab Results  Component Value Date   CHOL 207 (H) 11/09/2018   TRIG 85.0 11/09/2018   HDL 74.40 11/09/2018   CHOLHDL 3 11/09/2018   VLDL 17.0 11/09/2018   LDLCALC 116 (H) 11/09/2018   LDLCALC 122 (H) 12/08/2017   Lab Results  Component Value Date   TSH 2.41 09/02/2018    Therapeutic Level Labs: No results found for: LITHIUM No results found for: CBMZ No results found for: VALPROATE  Current Medications: Current Outpatient Medications  Medication Sig Dispense Refill  . Calcium Carbonate-Vit D-Min (CALCIUM 1200 PO) Take 1 tablet by mouth daily after breakfast.     . FLUoxetine (PROZAC) 20 MG capsule Take 1 capsule (20 mg total) by mouth daily. 30 capsule 5  . montelukast (SINGULAIR) 10 MG tablet TAKE 1 TABLET BY MOUTH AT BEDTIME 90 tablet 0  . pantoprazole (PROTONIX) 40 MG tablet Take 1 tablet by mouth once daily 30 tablet 0  . polyethylene glycol (MIRALAX) packet Take 17 g by mouth daily. (Patient taking differently: Take 17 g by mouth every other day. ) 14 each 0  . pravastatin (PRAVACHOL) 20 MG tablet Take 1 tablet by mouth once daily 90 tablet 0  . risperiDONE (RISPERDAL) 0.25 MG tablet Take 1 tablet (0.25 mg total) by mouth 2 (two) times daily. 60 tablet 5  . traZODone (DESYREL) 50 MG tablet Take 1 tablet (50 mg total) by mouth at bedtime. 30 tablet 5   No current facility-administered medications for this visit.     Musculoskeletal: Strength & Muscle Tone: within normal limits Gait & Station:  normal Patient leans:   Psychiatric Specialty Exam: ROS  There were no vitals taken for this visit.There is no height or weight on file to calculate BMI.  General Appearance: Casual  Eye Contact:  Good  Speech: Good  Volume:  Normal  Mood:  NA  Affect:  NA and Appropriate  Thought Process:  Coherent  Orientation:  Full (Time, Place, and Person)  Thought Content:  Logical  Suicidal Thoughts:  No  Homicidal Thoughts:  No  Memory:  Negative  Judgement:  Good  Insight:  Fair  Psychomotor Activity:  Normal  Concentration:    Recall:  Good  Fund of Knowledge:Good  Language: Good  Akathisia:  No  Handed:    AIMS (if indicated):  not done  Assets:  Desire for Improvement  ADL's:  Intact  Cognition: WNL  Sleep:  Good   Screenings: PHQ2-9     Office Visit from 11/09/2018 in [place] at [facility] from 12/03/2017 in [place] at [facility] from 10/28/2017 in [place] at [facility] from 06/16/2017 in [place] at [facility] from 10/24/2016 in [place] at [facility] Total Score  0  0  0  0  0  PHQ-9 Total Score  -  -  -  0  -      Assessment and Plan:   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  The patient takes Prozac 20 mg for this condition and also Risperdal 0.25 mg twice daily.  She has had issues in the past with insomnia and takes trazodone on a as needed basis.  Patient is not showing any evidence of psychosis at this time.  She is doing very well.  She will return to see me in 3 months and will talk about how long she needs to be on Risperdal, review her labs and do a AIM scale.  The patient is actually very stable at this time.  She is got good energy and is functioning well.  She is engaging and friendly.  Jerral Ralph, MD 10/16/202010:01 AM

## 2018-12-01 ENCOUNTER — Encounter: Payer: Medicare HMO | Admitting: Physical Therapy

## 2018-12-09 ENCOUNTER — Other Ambulatory Visit: Payer: Self-pay | Admitting: Family Medicine

## 2019-01-01 ENCOUNTER — Other Ambulatory Visit: Payer: Self-pay | Admitting: Family Medicine

## 2019-01-01 DIAGNOSIS — K219 Gastro-esophageal reflux disease without esophagitis: Secondary | ICD-10-CM

## 2019-01-13 ENCOUNTER — Other Ambulatory Visit: Payer: Self-pay | Admitting: Family Medicine

## 2019-01-13 DIAGNOSIS — J309 Allergic rhinitis, unspecified: Secondary | ICD-10-CM

## 2019-02-01 ENCOUNTER — Other Ambulatory Visit: Payer: Self-pay | Admitting: Family Medicine

## 2019-02-01 DIAGNOSIS — K219 Gastro-esophageal reflux disease without esophagitis: Secondary | ICD-10-CM

## 2019-02-24 ENCOUNTER — Ambulatory Visit (INDEPENDENT_AMBULATORY_CARE_PROVIDER_SITE_OTHER): Payer: Medicare HMO | Admitting: Psychiatry

## 2019-02-24 ENCOUNTER — Other Ambulatory Visit: Payer: Self-pay

## 2019-02-24 DIAGNOSIS — F322 Major depressive disorder, single episode, severe without psychotic features: Secondary | ICD-10-CM

## 2019-02-24 DIAGNOSIS — F329 Major depressive disorder, single episode, unspecified: Secondary | ICD-10-CM

## 2019-02-24 DIAGNOSIS — F411 Generalized anxiety disorder: Secondary | ICD-10-CM | POA: Diagnosis not present

## 2019-02-24 DIAGNOSIS — F32A Depression, unspecified: Secondary | ICD-10-CM

## 2019-02-24 MED ORDER — RISPERIDONE 0.25 MG PO TABS: 0.2500 mg | ORAL_TABLET | Freq: Two times a day (BID) | ORAL | 5 refills | Status: DC

## 2019-02-24 MED ORDER — FLUOXETINE HCL 20 MG PO CAPS: 20.0000 mg | ORAL_CAPSULE | Freq: Every day | ORAL | 5 refills | Status: DC

## 2019-02-24 NOTE — Progress Notes (Signed)
Psychiatric Initial Adult Assessment   Patient Identification: Nancy Herman MRN:  [id number] Date of Evaluation:  02/24/2019 Referral Source: Dr. Betty Martinique Chief Complaint:   Visit Diagnosis:    ICD-10-CM   1. Depressive disorder  F32.9 FLUoxetine (PROZAC) 20 MG capsule  2. Generalized anxiety disorder  F41.1 FLUoxetine (PROZAC) 20 MG capsule    History of Present Illness:    Today the patient is doing fairly well.  She takes her medicines as prescribed.  The patient is being treated by her primary care doctor Dr. Betty Martinique.  She has irritable bowel syndrome.  The patient's mood is actually fairly stable.  The popping that she has gotten is still causing some problems but she is going to keep the dog.  Patient denies daily depression and she is sleeping and eating well.  She has good energy.  She denies any psychotic symptoms at this time.  She likes watching TV shows listening to music a little bit but mainly she likes to read.  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  The stressors seem to be around issues in her neighborhood about her neighborhood association fixing up things around her.  She apparently got very upset with it.  Now she shows improved insight and that she is not letting it bother her.  It is noted that she has been in a therapy assisted situation: [facility] through her insurance company.  Overall the patient is stable.  She is functioning fairly well.  Her only real complaint is that she gained 10 pounds which I suggested might be due to her pandemic environment.  That is the patient lives alone does her own grocery shopping, still drives but cooks only for herself.  She acknowledges that she perhaps is overeating.  She denies chest pain or shortness of breath at this time.  Associated Signs/Symptoms: Depression Symptoms:  difficulty concentrating, (Hypo) Manic Symptoms:   Anxiety Symptoms:   Psychotic Symptoms:    PTSD Symptoms:   Past Psychiatric History: Prozac for many years  Previous Psychotropic Medications:   Substance Abuse History in the last 12 months:  No.  Consequences of Substance Abuse:   Past Medical History:  Past Medical History:  Diagnosis Date  . Anxiety   . Frequent headaches   . GERD (gastroesophageal reflux disease)   . Hyperlipidemia   . IBS (irritable bowel syndrome)   . Pituitary insufficiency ([place])   . Thyroid disease     Past Surgical History:  Procedure Laterality Date  . ABDOMINAL HYSTERECTOMY  1990  . ANAL RECTAL MANOMETRY N/A 08/14/2018   Procedure: ANO RECTAL MANOMETRY;  Surgeon: Mauri Pole, MD;  Location: [facility];  Service: Endoscopy;  Laterality: N/A;  . BRAIN SURGERY    . BREAST EXCISIONAL BIOPSY Right unsure  . BREAST EXCISIONAL BIOPSY Left unsure  . OVARIAN CYST SURGERY  1994  . REPAIR RECTOCELE  2016   and prolapsed uterus.  . TONSILLECTOMY  1973    Family Psychiatric History:   Family History:  Family History  Problem Relation Age of Onset  . Heart disease Mother        No details   . Alcohol abuse Mother   . Drug abuse Mother   . Sickle cell anemia Mother   . Heart disease Father        No details . Questionable bypass or stent  . Drug abuse Father   . Alcohol abuse Father  Social History:   Social History   Socioeconomic History  . Marital status: Single    Spouse name: Not on file  . Number of children: Not on file  . Years of education: Not on file  . Highest education level: Not on file  Occupational History  . Not on file  Tobacco Use  . Smoking status: Never Smoker  . Smokeless tobacco: Never Used  Substance and Sexual Activity  . Alcohol use: No  . Drug use: No  . Sexual activity: Never  Other Topics Concern  . Not on file  Social History Narrative   Lives alone.  Lives near son.  Was in [place].   Social Determinants of Health   Financial Resource Strain:   . Difficulty of Paying Living  Expenses: Not on file  Food Insecurity:   . Worried About Charity fundraiser in the Last Year: Not on file  . Ran Out of Food in the Last Year: Not on file  Transportation Needs:   . Lack of Transportation (Medical): Not on file  . Lack of Transportation (Non-Medical): Not on file  Physical Activity:   . Days of Exercise per Week: Not on file  . Minutes of Exercise per Session: Not on file  Stress:   . Feeling of Stress : Not on file  Social Connections:   . Frequency of Communication with Friends and Family: Not on file  . Frequency of Social Gatherings with Friends and Family: Not on file  . Attends Religious Services: Not on file  . Active Member of Clubs or Organizations: Not on file  . Attends Archivist Meetings: Not on file  . Marital Status: Not on file    Additional Social History:   Allergies:   Allergies  Allergen Reactions  . Other Other (See Comments)    Seasonal allergies    Metabolic Disorder Labs: Lab Results  Component Value Date   HGBA1C 5.9 11/09/2018   Lab Results  Component Value Date   PROLACTIN 5.0 03/04/2018   PROLACTIN 8.8 09/26/2015   Lab Results  Component Value Date   CHOL 207 (H) 11/09/2018   TRIG 85.0 11/09/2018   HDL 74.40 11/09/2018   CHOLHDL 3 11/09/2018   VLDL 17.0 11/09/2018   LDLCALC 116 (H) 11/09/2018   LDLCALC 122 (H) 12/08/2017   Lab Results  Component Value Date   TSH 2.41 09/02/2018    Therapeutic Level Labs: No results found for: LITHIUM No results found for: CBMZ No results found for: VALPROATE  Current Medications: Current Outpatient Medications  Medication Sig Dispense Refill  . Calcium Carbonate-Vit D-Min (CALCIUM 1200 PO) Take 1 tablet by mouth daily after breakfast.     . FLUoxetine (PROZAC) 20 MG capsule Take 1 capsule (20 mg total) by mouth daily. 30 capsule 5  . montelukast (SINGULAIR) 10 MG tablet TAKE 1 TABLET BY MOUTH AT BEDTIME 90 tablet 0  . pantoprazole (PROTONIX) 40 MG tablet Take 1  tablet by mouth once daily 30 tablet 2  . polyethylene glycol (MIRALAX) packet Take 17 g by mouth daily. (Patient taking differently: Take 17 g by mouth every other day. ) 14 each 0  . pravastatin (PRAVACHOL) 20 MG tablet Take 1 tablet by mouth once daily 90 tablet 0  . risperiDONE (RISPERDAL) 0.25 MG tablet Take 1 tablet (0.25 mg total) by mouth 2 (two) times daily. 60 tablet 5  . traZODone (DESYREL) 50 MG tablet Take 1 tablet (50 mg total) by mouth at  bedtime. 30 tablet 5   No current facility-administered medications for this visit.    Musculoskeletal: Strength & Muscle Tone: within normal limits Gait & Station: normal Patient leans:   Psychiatric Specialty Exam: ROS  There were no vitals taken for this visit.There is no height or weight on file to calculate BMI.  General Appearance: Casual  Eye Contact:  Good  Speech: Good  Volume:  Normal  Mood:  NA  Affect:  NA and Appropriate  Thought Process:  Coherent  Orientation:  Full (Time, Place, and Person)  Thought Content:  Logical  Suicidal Thoughts:  No  Homicidal Thoughts:  No  Memory:  Negative  Judgement:  Good  Insight:  Fair  Psychomotor Activity:  Normal  Concentration:    Recall:  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  She takes Prozac  20 mg and Risperdal 0.25 mg twice daily.  When she is seen again in 3 months we shall consider the possibility of reducing Risperdal down to 0.25 only at night.  Overall patient is stable and doing fairly well.  The patient will be seen again in 3 months.

## 2019-02-28 ENCOUNTER — Ambulatory Visit: Payer: Self-pay

## 2019-03-05 ENCOUNTER — Other Ambulatory Visit: Payer: Self-pay

## 2019-03-05 ENCOUNTER — Encounter: Payer: Self-pay | Admitting: Endocrinology

## 2019-03-05 ENCOUNTER — Ambulatory Visit: Payer: Medicare HMO | Admitting: Endocrinology

## 2019-03-05 VITALS — BP 106/70 | HR 95 | Ht 63.0 in | Wt 156.8 lb

## 2019-03-05 DIAGNOSIS — D352 Benign neoplasm of pituitary gland: Secondary | ICD-10-CM | POA: Diagnosis not present

## 2019-03-05 LAB — T4, FREE: Free T4: 0.8 ng/dL (ref 0.60–1.60)

## 2019-03-05 LAB — CORTISOL: Cortisol, Plasma: 11.1 ug/dL

## 2019-03-05 LAB — PROLACTIN: Prolactin: 14.6 ng/mL

## 2019-03-05 LAB — LUTEINIZING HORMONE: LH: 5.26 m[IU]/mL

## 2019-03-05 LAB — TSH: TSH: 2.49 u[IU]/mL (ref 0.35–4.50)

## 2019-03-05 NOTE — Patient Instructions (Signed)
Blood tests are requested for you today.  We'll let you know about the results.  Please come back for a follow-up appointment in 1 year.    

## 2019-03-05 NOTE — Progress Notes (Signed)
Subjective:    Patient ID: Nancy Herman, female    DOB: 17-Apr-1945, 74 y.o.   MRN: [id number]  HPI Pt returns for f/u of pituitary adenoma (apparently nonsecretory; was 1.8 mm diameter; dx'ed and resected in 2017 (in [place]); she did not have XRT; she took synthroid until early 2020; since then, she is on no pituitary rx; f/u MRI in 2020 showed no residual tumor).   She had these pit function results in early 2020:  FSH/LH: normal (not menopausal).   Prol: normal ACTH: stim test normal GH: IGF-1 was normal VP: USG=1.010, VP was undetectable; 24-HR urine was 2300 ml.   TSH: euthyroid off synthroid.  She has been off synthroid since early 2020.  pt states she feels well in general.   Past Medical History:  Diagnosis Date  . Anxiety   . Frequent headaches   . GERD (gastroesophageal reflux disease)   . Hyperlipidemia   . IBS (irritable bowel syndrome)   . Pituitary insufficiency ([place])   . Thyroid disease     Past Surgical History:  Procedure Laterality Date  . ABDOMINAL HYSTERECTOMY  1990  . ANAL RECTAL MANOMETRY N/A 08/14/2018   Procedure: ANO RECTAL MANOMETRY;  Surgeon: Mauri Pole, MD;  Location: [facility];  Service: Endoscopy;  Laterality: N/A;  . BRAIN SURGERY    . BREAST EXCISIONAL BIOPSY Right unsure  . BREAST EXCISIONAL BIOPSY Left unsure  . OVARIAN CYST SURGERY  1994  . REPAIR RECTOCELE  2016   and prolapsed uterus.  . TONSILLECTOMY  1973    Social History   Socioeconomic History  . Marital status: Single    Spouse name: Not on file  . Number of children: Not on file  . Years of education: Not on file  . Highest education level: Not on file  Occupational History  . Not on file  Tobacco Use  . Smoking status: Never Smoker  . Smokeless tobacco: Never Used  Substance and Sexual Activity  . Alcohol use: No  . Drug use: No  . Sexual activity: Never  Other Topics Concern  . Not on file  Social History Narrative   Lives alone.  Lives near son.   Was in [place].   Social Determinants of Health   Financial Resource Strain:   . Difficulty of Paying Living Expenses: Not on file  Food Insecurity:   . Worried About Charity fundraiser in the Last Year: Not on file  . Ran Out of Food in the Last Year: Not on file  Transportation Needs:   . Lack of Transportation (Medical): Not on file  . Lack of Transportation (Non-Medical): Not on file  Physical Activity:   . Days of Exercise per Week: Not on file  . Minutes of Exercise per Session: Not on file  Stress:   . Feeling of Stress : Not on file  Social Connections:   . Frequency of Communication with Friends and Family: Not on file  . Frequency of Social Gatherings with Friends and Family: Not on file  . Attends Religious Services: Not on file  . Active Member of Clubs or Organizations: Not on file  . Attends Archivist Meetings: Not on file  . Marital Status: Not on file  Intimate Partner Violence:   . Fear of Current or Ex-Partner: Not on file  . Emotionally Abused: Not on file  . Physically Abused: Not on file  . Sexually Abused: Not on file    Current Outpatient Medications  on File Prior to Visit  Medication Sig Dispense Refill  . Calcium Carbonate-Vit D-Min (CALCIUM 1200 PO) Take 1 tablet by mouth daily after breakfast.     . FLUoxetine (PROZAC) 20 MG capsule Take 1 capsule (20 mg total) by mouth daily. 30 capsule 5  . montelukast (SINGULAIR) 10 MG tablet TAKE 1 TABLET BY MOUTH AT BEDTIME 90 tablet 0  . pantoprazole (PROTONIX) 40 MG tablet Take 1 tablet by mouth once daily 30 tablet 2  . polyethylene glycol (MIRALAX) packet Take 17 g by mouth daily. (Patient taking differently: Take 17 g by mouth every other day. ) 14 each 0  . pravastatin (PRAVACHOL) 20 MG tablet Take 1 tablet by mouth once daily 90 tablet 0  . risperiDONE (RISPERDAL) 0.25 MG tablet Take 1 tablet (0.25 mg total) by mouth 2 (two) times daily. 60 tablet 5  . traZODone (DESYREL) 50 MG tablet Take 1 tablet  (50 mg total) by mouth at bedtime. 30 tablet 5   No current facility-administered medications on file prior to visit.    Allergies  Allergen Reactions  . Other Other (See Comments)    Seasonal allergies    Family History  Problem Relation Age of Onset  . Heart disease Mother        No details   . Alcohol abuse Mother   . Drug abuse Mother   . Sickle cell anemia Mother   . Heart disease Father        No details . Questionable bypass or stent  . Drug abuse Father   . Alcohol abuse Father     BP 106/70 (BP Location: Left Arm, Patient Position: Sitting, Cuff Size: Normal)   Pulse 95   Ht 5\' 3"  (1.6 m)   Wt 156 lb 12.8 oz (71.1 kg)   LMP  (LMP Unknown)   SpO2 98%   BMI 27.78 kg/m    Review of Systems Denies falls.      Objective:   Physical Exam VITAL SIGNS:  See vs page GENERAL: no distress EXT: trace bilat leg swelling GAIT: normal and steady.   Lab Results  Component Value Date   CREATININE 1.05 11/09/2018   BUN 15 11/09/2018   NA 141 11/09/2018   K 4.4 11/09/2018   CL 104 11/09/2018   CO2 30 11/09/2018       Assessment & Plan:  Pituitary adenoma: we discussed.  She declines MRI this year.   Hypothyroidism, by hx: recheck labs today  Patient Instructions  Blood tests are requested for you today.  We'll let you know about the results.  Please come back for a follow-up appointment in 1 year.

## 2019-03-10 ENCOUNTER — Telehealth: Payer: Self-pay | Admitting: Endocrinology

## 2019-03-10 NOTE — Telephone Encounter (Signed)
[employer]  Comments to Patient Not seen  Hi Nancy Herman: Normal results--good. I hope you feel well.     Above was sent to pt via My Chart by Dr. Loanne Drilling. Called pt and left VM advising to review her UNREAD message on My Chart. Advised she call if she had questions.

## 2019-03-10 NOTE — Telephone Encounter (Signed)
Patient requests to be called at ph# [phone] to go over recent lab results

## 2019-03-14 ENCOUNTER — Other Ambulatory Visit: Payer: Self-pay | Admitting: Family Medicine

## 2019-04-02 ENCOUNTER — Telehealth: Payer: Self-pay | Admitting: Family Medicine

## 2019-04-02 NOTE — Telephone Encounter (Signed)
Main treatment is to prevent future bites, treating mattresses/house for bedbugs. OTC hydrocortisone 1% can be applied on area 3 times daily as needed. Monitor for signs of infection. Thanks, BJ

## 2019-04-02 NOTE — Telephone Encounter (Signed)
I called and spoke with pt. We went over the information below & she verbalized understanding. 

## 2019-04-02 NOTE — Telephone Encounter (Signed)
Pt has a bed bug bite and wants to know what she should be putting on it. She would like a call back.   Patient Phone: [phone]

## 2019-04-16 ENCOUNTER — Other Ambulatory Visit: Payer: Self-pay | Admitting: Family Medicine

## 2019-04-16 DIAGNOSIS — J309 Allergic rhinitis, unspecified: Secondary | ICD-10-CM

## 2019-04-19 ENCOUNTER — Other Ambulatory Visit: Payer: Self-pay

## 2019-04-19 DIAGNOSIS — J309 Allergic rhinitis, unspecified: Secondary | ICD-10-CM

## 2019-04-19 MED ORDER — MONTELUKAST SODIUM 10 MG PO TABS: 10.0000 mg | ORAL_TABLET | Freq: Every day | ORAL | 1 refills | Status: DC

## 2019-05-01 ENCOUNTER — Other Ambulatory Visit: Payer: Self-pay | Admitting: Family Medicine

## 2019-05-01 DIAGNOSIS — K219 Gastro-esophageal reflux disease without esophagitis: Secondary | ICD-10-CM

## 2019-05-03 NOTE — Telephone Encounter (Signed)
Sent to the pharmacy by e-scribe. 

## 2019-05-10 ENCOUNTER — Ambulatory Visit (INDEPENDENT_AMBULATORY_CARE_PROVIDER_SITE_OTHER): Payer: Medicare HMO | Admitting: Family Medicine

## 2019-05-10 ENCOUNTER — Other Ambulatory Visit: Payer: Self-pay

## 2019-05-10 ENCOUNTER — Encounter: Payer: Self-pay | Admitting: Family Medicine

## 2019-05-10 VITALS — BP 128/72 | HR 66 | Temp 98.8°F | Resp 12 | Ht 63.0 in | Wt 154.0 lb

## 2019-05-10 DIAGNOSIS — Z23 Encounter for immunization: Secondary | ICD-10-CM | POA: Diagnosis not present

## 2019-05-10 DIAGNOSIS — H6122 Impacted cerumen, left ear: Secondary | ICD-10-CM | POA: Diagnosis not present

## 2019-05-10 DIAGNOSIS — Z0000 Encounter for general adult medical examination without abnormal findings: Secondary | ICD-10-CM

## 2019-05-10 DIAGNOSIS — Z780 Asymptomatic menopausal state: Secondary | ICD-10-CM | POA: Diagnosis not present

## 2019-05-10 DIAGNOSIS — E230 Hypopituitarism: Secondary | ICD-10-CM

## 2019-05-10 MED ORDER — DEBROX 6.5 % OT SOLN: 5.0000 [drp] | Freq: Two times a day (BID) | OTIC | 0 refills | Status: AC

## 2019-05-10 NOTE — Patient Instructions (Addendum)
A few things to remember from today's visit:   Routine general medical examination at a health care facility  A few tips:  -As we age balance is not as good as it was, so there is a higher risks for falls. Please remove small rugs and furniture that is "in your way" and could increase the risk of falls. Stretching exercises may help with fall prevention: Yoga and Tai Chi are some examples. Low impact exercise is better, so you are not very achy the next day.  -Sun screen and avoidance of direct sun light recommended. Caution with dehydration, if working outdoors be sure to drink enough fluids.  - Some medications are not safe as we age, increases the risk of side effects and can potentially interact with other medication you are also taken;  including some of over the counter medications. Be sure to let me know when you start a new medication even if it is a dietary/vitamin supplement.   -Healthy diet low in red meet/animal fat and sugar + regular physical activity is recommended.      Please be sure medication list is accurate. If a new problem present, please set up appointment sooner than planned today.

## 2019-05-10 NOTE — Progress Notes (Signed)
HPI:   Nancy Herman is a 74 y.o. female, who is here today for her routine physical.  Last CPE: > a year ago.  Regular exercise 3 or more time per week: She is walking her dog " a little."  Following a healthy diet: "Trying to."Drinking more water and her bigger meal now is lunch. She lives alone. Her son lives close by and calls her daily.  Chronic medical problems: GERD,IBS,insomnia,allergies,neck pain,glaucoma,pituitary insufficiency,and depression among some. She follows with endocrinologist,GI,and psychiatrist.  She sees psychotherapist q 2 weeks.  Immunization History  Administered Date(s) Administered  . Fluad Quad(high Dose 65+) 11/09/2018  . Influenza, High Dose Seasonal PF 11/06/2015, 10/24/2016, 12/03/2017  . Pneumococcal Conjugate-13 09/04/2013  . Pneumococcal Polysaccharide-23 08/04/2012, 05/10/2019  . Zoster Recombinat (Shingrix) 04/04/2017, 06/06/2017, 07/05/2017, 08/13/2017   Mammogram: 10/01/18 [facility] 3. Recommended repeat in 09/2019. Colonoscopy: 2016. DEXA: 2018,osteopenia.  Hep C screening:  Associated Dx: Encounter for hepatitis C screening test for low risk patient      Ref Range & Units1 yr ago NON-REACTIVE  Dizzy spells when she bends down,feeling like she is "drunk" for a few seconds. Tthis has been going on for "a while," Stable. No associated CP,palpitation,or MS changes.  Review of Systems  Constitutional: Positive for fatigue. Negative for activity change, appetite change and fever.  HENT: Negative for mouth sores, nosebleeds and sore throat.   Eyes: Negative for redness and visual disturbance.  Respiratory: Negative for cough, shortness of breath and wheezing.   Cardiovascular: Negative for leg swelling.  Gastrointestinal: Negative for abdominal pain, nausea and vomiting.       Negative for changes in bowel habits.  Endocrine: Negative for cold intolerance, heat intolerance, polydipsia, polyphagia and polyuria.   Genitourinary: Negative for decreased urine volume, dysuria and hematuria.  Musculoskeletal: Negative for gait problem and myalgias.  Skin: Negative for pallor and rash.  Allergic/Immunologic: Positive for environmental allergies.  Neurological: Negative for syncope, weakness and headaches.  Psychiatric/Behavioral: Negative for confusion. The patient is nervous/anxious.   All other systems reviewed and are negative.   Current Outpatient Medications on File Prior to Visit  Medication Sig Dispense Refill  . Calcium Carbonate-Vit D-Min (CALCIUM 1200 PO) Take 1 tablet by mouth daily after breakfast.     . FLUoxetine (PROZAC) 20 MG capsule Take 1 capsule (20 mg total) by mouth daily. 30 capsule 5  . montelukast (SINGULAIR) 10 MG tablet Take 1 tablet (10 mg total) by mouth at bedtime. 90 tablet 1  . pantoprazole (PROTONIX) 40 MG tablet Take 1 tablet by mouth once daily 30 tablet 5  . polyethylene glycol (MIRALAX) packet Take 17 g by mouth daily. (Patient taking differently: Take 17 g by mouth every other day. ) 14 each 0  . pravastatin (PRAVACHOL) 20 MG tablet Take 1 tablet by mouth once daily 90 tablet 2  . risperiDONE (RISPERDAL) 0.25 MG tablet Take 1 tablet (0.25 mg total) by mouth 2 (two) times daily. 60 tablet 5  . traZODone (DESYREL) 50 MG tablet Take 1 tablet (50 mg total) by mouth at bedtime. 30 tablet 5   No current facility-administered medications on file prior to visit.   Past Medical History:  Diagnosis Date  . Anxiety   . Frequent headaches   . GERD (gastroesophageal reflux disease)   . Hyperlipidemia   . IBS (irritable bowel syndrome)   . Pituitary insufficiency ([place])   . Thyroid disease     Past Surgical History:  Procedure Laterality Date  .  ABDOMINAL HYSTERECTOMY  1990  . ANAL RECTAL MANOMETRY N/A 08/14/2018   Procedure: ANO RECTAL MANOMETRY;  Surgeon: Mauri Pole, MD;  Location: [facility];  Service: Endoscopy;  Laterality: N/A;  . BRAIN SURGERY    .  BREAST EXCISIONAL BIOPSY Right unsure  . BREAST EXCISIONAL BIOPSY Left unsure  . OVARIAN CYST SURGERY  1994  . REPAIR RECTOCELE  2016   and prolapsed uterus.  . TONSILLECTOMY  1973    Allergies  Allergen Reactions  . Other Other (See Comments)    Seasonal allergies    Family History  Problem Relation Age of Onset  . Heart disease Mother        No details   . Alcohol abuse Mother   . Drug abuse Mother   . Sickle cell anemia Mother   . Heart disease Father        No details . Questionable bypass or stent  . Drug abuse Father   . Alcohol abuse Father     Social History   Socioeconomic History  . Marital status: Single    Spouse name: Not on file  . Number of children: Not on file  . Years of education: Not on file  . Highest education level: Not on file  Occupational History  . Not on file  Tobacco Use  . Smoking status: Never Smoker  . Smokeless tobacco: Never Used  Substance and Sexual Activity  . Alcohol use: No  . Drug use: No  . Sexual activity: Never  Other Topics Concern  . Not on file  Social History Narrative   Lives alone.  Lives near son.  Was in [place].   Social Determinants of Health   Financial Resource Strain:   . Difficulty of Paying Living Expenses:   Food Insecurity:   . Worried About Charity fundraiser in the Last Year:   . Arboriculturist in the Last Year:   Transportation Needs:   . Film/video editor (Medical):   Marland Kitchen Lack of Transportation (Non-Medical):   Physical Activity:   . Days of Exercise per Week:   . Minutes of Exercise per Session:   Stress:   . Feeling of Stress :   Social Connections:   . Frequency of Communication with Friends and Family:   . Frequency of Social Gatherings with Friends and Family:   . Attends Religious Services:   . Active Member of Clubs or Organizations:   . Attends Archivist Meetings:   Marland Kitchen Marital Status:     Vitals:   05/10/19 1038  BP: 128/72  Pulse: 66  Resp: 12  Temp: 98.8 F  (37.1 C)  SpO2: 99%   Body mass index is 27.28 kg/m.  Wt Readings from Last 3 Encounters:  05/10/19 154 lb (69.9 kg)  03/05/19 156 lb 12.8 oz (71.1 kg)  11/09/18 154 lb 4 oz (70 kg)   Physical Exam  Nursing note and vitals reviewed. Constitutional: She is oriented to person, place, and time. She appears well-developed. No distress.  HENT:  Head: Normocephalic and atraumatic.  Right Ear: Hearing, tympanic membrane, external ear and ear canal normal.  Left Ear: Hearing and external ear normal.  Mouth/Throat: Uvula is midline, oropharynx is clear and moist and mucous membranes are normal.  Left ear canal cerumen excess, not able to see TM.  Eyes: Pupils are equal, round, and reactive to light. Conjunctivae and EOM are normal.  Neck: No tracheal deviation present. No thyromegaly present.  Cardiovascular: Normal rate and regular rhythm.  No murmur heard. Pulses:      Dorsalis pedis pulses are 2+ on the right side and 2+ on the left side.  Respiratory: Effort normal and breath sounds normal. No respiratory distress.  GI: Soft. She exhibits no mass. There is no hepatomegaly. There is no abdominal tenderness.  Musculoskeletal:        General: No edema.     Comments: No major deformity or signs of synovitis appreciated.  Lymphadenopathy:    She has no cervical adenopathy.       Right: No supraclavicular adenopathy present.       Left: No supraclavicular adenopathy present.  Neurological: She is alert and oriented to person, place, and time. She has normal strength. No cranial nerve deficit. Coordination and gait normal.  Reflex Scores:      Bicep reflexes are 2+ on the right side and 2+ on the left side.      Patellar reflexes are 2+ on the right side and 2+ on the left side. Skin: Skin is warm. No rash noted. No erythema.  Psychiatric: She has a normal mood and affect.  Well groomed, good eye contact.   ASSESSMENT AND PLAN:  Ms. Nancy Herman was here today annual physical  examination.  Orders Placed This Encounter  Procedures  . DG Bone Density  . Pneumococcal polysaccharide vaccine 23-valent greater than or equal to 2yo subcutaneous/IM    Routine general medical examination at a health care facility We discussed the importance of regular physical activity and healthy diet for prevention of chronic illness and/or complications. Preventive guidelines reviewed. Vaccination updated.  Ca++ and vit D supplementation to continue. Next CPE in a year.  Excessive cerumen in left ear canal Asymptomatic. Avoid Q tips.  -     carbamide peroxide (DEBROX) 6.5 % OTIC solution; Place 5 drops into the left ear 2 (two) times daily.  Asymptomatic postmenopausal estrogen deficiency -     DG Bone Density; Future  Need for pneumococcal vaccination -     Pneumococcal polysaccharide vaccine 23-valent greater than or equal to 2yo subcutaneous/IM  Pituitary insufficiency [facility]) Following with endocrinologist.   Return in 6 months (on 11/09/2019) for AWV.   Sanjiv Castorena G. Martinique, MD  [facility]. [place] office.    A few things to remember from today's visit:   Routine general medical examination at a health care facility  A few tips:  -As we age balance is not as good as it was, so there is a higher risks for falls. Please remove small rugs and furniture that is "in your way" and could increase the risk of falls. Stretching exercises may help with fall prevention: Yoga and Tai Chi are some examples. Low impact exercise is better, so you are not very achy the next day.  -Sun screen and avoidance of direct sun light recommended. Caution with dehydration, if working outdoors be sure to drink enough fluids.  - Some medications are not safe as we age, increases the risk of side effects and can potentially interact with other medication you are also taken;  including some of over the counter medications. Be sure to let me know when you start a new medication  even if it is a dietary/vitamin supplement.   -Healthy diet low in red meet/animal fat and sugar + regular physical activity is recommended.      Please be sure medication list is accurate. If a new problem present, please set up appointment  sooner than planned today.

## 2019-05-25 ENCOUNTER — Telehealth (HOSPITAL_COMMUNITY): Payer: Medicare HMO | Admitting: Psychiatry

## 2019-05-25 ENCOUNTER — Other Ambulatory Visit: Payer: Self-pay

## 2019-05-26 ENCOUNTER — Telehealth (INDEPENDENT_AMBULATORY_CARE_PROVIDER_SITE_OTHER): Payer: Medicare HMO | Admitting: Psychiatry

## 2019-05-26 ENCOUNTER — Other Ambulatory Visit: Payer: Self-pay

## 2019-05-26 DIAGNOSIS — F329 Major depressive disorder, single episode, unspecified: Secondary | ICD-10-CM

## 2019-05-26 DIAGNOSIS — F411 Generalized anxiety disorder: Secondary | ICD-10-CM

## 2019-05-26 DIAGNOSIS — F333 Major depressive disorder, recurrent, severe with psychotic symptoms: Secondary | ICD-10-CM | POA: Diagnosis not present

## 2019-05-26 DIAGNOSIS — F32A Depression, unspecified: Secondary | ICD-10-CM

## 2019-05-26 MED ORDER — RISPERIDONE 0.25 MG PO TABS: 0.2500 mg | ORAL_TABLET | Freq: Two times a day (BID) | ORAL | 5 refills | Status: DC

## 2019-05-26 MED ORDER — FLUOXETINE HCL 20 MG PO CAPS: 20.0000 mg | ORAL_CAPSULE | Freq: Every day | ORAL | 5 refills | Status: DC

## 2019-05-26 NOTE — Progress Notes (Signed)
Psychiatric Initial Adult Assessment   Patient Identification: Nancy Herman MRN:  [id number] Date of Evaluation:  05/26/2019 Referral Source: Dr. Betty Martinique Chief Complaint:   Visit Diagnosis:    ICD-10-CM   1. Depressive disorder  F32.9 FLUoxetine (PROZAC) 20 MG capsule  2. Generalized anxiety disorder  F41.1 FLUoxetine (PROZAC) 20 MG capsule    History of Present Illness: Today patient is doing well.  She seems to be quite resilient.  The thing that bothers her most is her new puppy dog the dog is also hard to take care of.  Patient denies being depressed.  She denies anhedonia.  She enjoys going to Bible study watching TV and reading.  Her neighbors are not bothering her anymore.  The patient has no evidence of psychosis.  She is sleeping and eating actually fairly well.  Her irritable bowel is fairly well-controlled nonetheless she is going to see her gastroenterologist next week.  She has a scheduled colonoscopy.  Overall though her health is good.  She denies chest pain or shortness of breath or any signs of a viral infection.  Financially she is stable.  She gets on her exercise bike 2 times a week.  She drinks no alcohol uses no drugs.  Medically she is quite stable.  I discussed the possibility of lowering her Risperdal which we will consider on her next visit.  For now she is functioning very well.   Depression Symptoms:  difficulty concentrating, (Hypo) Manic Symptoms:   Anxiety Symptoms:   Psychotic Symptoms:   PTSD Symptoms:   Past Psychiatric History: Prozac for many years  Previous Psychotropic Medications:   Substance Abuse History in the last 12 months:  No.  Consequences of Substance Abuse:   Past Medical History:  Past Medical History:  Diagnosis Date  . Anxiety   . Frequent headaches   . GERD (gastroesophageal reflux disease)   . Hyperlipidemia   . IBS (irritable bowel syndrome)   . Pituitary insufficiency ([place])   . Thyroid disease     Past  Surgical History:  Procedure Laterality Date  . ABDOMINAL HYSTERECTOMY  1990  . ANAL RECTAL MANOMETRY N/A 08/14/2018   Procedure: ANO RECTAL MANOMETRY;  Surgeon: Mauri Pole, MD;  Location: [facility];  Service: Endoscopy;  Laterality: N/A;  . BRAIN SURGERY    . BREAST EXCISIONAL BIOPSY Right unsure  . BREAST EXCISIONAL BIOPSY Left unsure  . OVARIAN CYST SURGERY  1994  . REPAIR RECTOCELE  2016   and prolapsed uterus.  . TONSILLECTOMY  1973    Family Psychiatric History:   Family History:  Family History  Problem Relation Age of Onset  . Heart disease Mother        No details   . Alcohol abuse Mother   . Drug abuse Mother   . Sickle cell anemia Mother   . Heart disease Father        No details . Questionable bypass or stent  . Drug abuse Father   . Alcohol abuse Father     Social History:   Social History   Socioeconomic History  . Marital status: Single    Spouse name: Not on file  . Number of children: Not on file  . Years of education: Not on file  . Highest education level: Not on file  Occupational History  . Not on file  Tobacco Use  . Smoking status: Never Smoker  . Smokeless tobacco: Never Used  Substance and Sexual Activity  . Alcohol  use: No  . Drug use: No  . Sexual activity: Never  Other Topics Concern  . Not on file  Social History Narrative   Lives alone.  Lives near son.  Was in [place].   Social Determinants of Health   Financial Resource Strain:   . Difficulty of Paying Living Expenses:   Food Insecurity:   . Worried About Charity fundraiser in the Last Year:   . Arboriculturist in the Last Year:   Transportation Needs:   . Film/video editor (Medical):   Marland Kitchen Lack of Transportation (Non-Medical):   Physical Activity:   . Days of Exercise per Week:   . Minutes of Exercise per Session:   Stress:   . Feeling of Stress :   Social Connections:   . Frequency of Communication with Friends and Family:   . Frequency of Social  Gatherings with Friends and Family:   . Attends Religious Services:   . Active Member of Clubs or Organizations:   . Attends Archivist Meetings:   Marland Kitchen Marital Status:     Additional Social History:   Allergies:   Allergies  Allergen Reactions  . Other Other (See Comments)    Seasonal allergies    Metabolic Disorder Labs: Lab Results  Component Value Date   HGBA1C 5.9 11/09/2018   Lab Results  Component Value Date   PROLACTIN 14.6 03/05/2019   PROLACTIN 5.0 03/04/2018   Lab Results  Component Value Date   CHOL 207 (H) 11/09/2018   TRIG 85.0 11/09/2018   HDL 74.40 11/09/2018   CHOLHDL 3 11/09/2018   VLDL 17.0 11/09/2018   LDLCALC 116 (H) 11/09/2018   LDLCALC 122 (H) 12/08/2017   Lab Results  Component Value Date   TSH 2.49 03/05/2019    Therapeutic Level Labs: No results found for: LITHIUM No results found for: CBMZ No results found for: VALPROATE  Current Medications: Current Outpatient Medications  Medication Sig Dispense Refill  . Calcium Carbonate-Vit D-Min (CALCIUM 1200 PO) Take 1 tablet by mouth daily after breakfast.     . carbamide peroxide (DEBROX) 6.5 % OTIC solution Place 5 drops into the left ear 2 (two) times daily. 15 mL 0  . FLUoxetine (PROZAC) 20 MG capsule Take 1 capsule (20 mg total) by mouth daily. 30 capsule 5  . montelukast (SINGULAIR) 10 MG tablet Take 1 tablet (10 mg total) by mouth at bedtime. 90 tablet 1  . pantoprazole (PROTONIX) 40 MG tablet Take 1 tablet by mouth once daily 30 tablet 5  . polyethylene glycol (MIRALAX) packet Take 17 g by mouth daily. (Patient taking differently: Take 17 g by mouth every other day. ) 14 each 0  . pravastatin (PRAVACHOL) 20 MG tablet Take 1 tablet by mouth once daily 90 tablet 2  . risperiDONE (RISPERDAL) 0.25 MG tablet Take 1 tablet (0.25 mg total) by mouth 2 (two) times daily. 60 tablet 5  . traZODone (DESYREL) 50 MG tablet Take 1 tablet (50 mg total) by mouth at bedtime. 30 tablet 5   No  current facility-administered medications for this visit.    Musculoskeletal: Strength & Muscle Tone: within normal limits Gait & Station: normal Patient leans:   Psychiatric Specialty Exam: ROS  There were no vitals taken for this visit.There is no height or weight on file to calculate BMI.  General Appearance: Casual  Eye Contact:  Good  Speech: Good  Volume:  Normal  Mood:  NA  Affect:  NA and  Appropriate  Thought Process:  Coherent  Orientation:  Full (Time, Place, and Person)  Thought Content:  Logical  Suicidal Thoughts:  No  Homicidal Thoughts:  No  Memory:  Negative  Judgement:  Good  Insight:  Fair  Psychomotor Activity:  Normal  Concentration:    Recall:  [place] of Knowledge:Good  Language: Good  Akathisia:  No  Handed:    AIMS (if indicated):  not done  Assets:  Desire for Improvement  ADL's:  Intact  Cognition: WNL  Sleep:  Good   Screenings: PHQ2-9     Office Visit from 11/09/2018 in [place] at [facility] from 12/03/2017 in [place] at [facility] from 10/28/2017 in [place] at [facility] from 06/16/2017 in [place] at [facility] from 10/24/2016 in [place] at [facility] Total Score  0  0  0  0  0  PHQ-9 Total Score  --  --  --  0  --      Assessment and Plan:  4/21/20211:52 PM   Today the patient is doing well.  Her diagnosis is major depression with psychosis.  She takes Prozac 20 mg and Risperdal 0.25 mg twice daily.  She has no consequences or side effects from her medications.  She is closely followed in her medical office.  The patient is not in therapy at this time.  She functions independently.  She lives alone.  She still drives.  She is had no accidents.  She has no falls.  The patient be seen again in 3 months.  At that time we will seriously consider reducing her Risperdal.

## 2019-05-27 ENCOUNTER — Telehealth (HOSPITAL_COMMUNITY): Payer: Self-pay | Admitting: *Deleted

## 2019-05-27 ENCOUNTER — Ambulatory Visit (HOSPITAL_COMMUNITY): Payer: Medicare HMO | Admitting: Psychiatry

## 2019-05-27 NOTE — Telephone Encounter (Signed)
Pt called regarding conversation on yesterday's visit about reducing the Risperdal 0.25mg  bid to qd. Pt would like to do this. She says the Risperdal has caused weight gain. Sounds she's sleepy during the day as well. Please review.

## 2019-06-04 ENCOUNTER — Other Ambulatory Visit (HOSPITAL_COMMUNITY): Payer: Self-pay | Admitting: *Deleted

## 2019-06-04 MED ORDER — RISPERIDONE 0.25 MG PO TABS: 0.2500 mg | ORAL_TABLET | Freq: Every day | ORAL | 3 refills | Status: DC

## 2019-06-11 ENCOUNTER — Other Ambulatory Visit: Payer: Self-pay

## 2019-06-11 ENCOUNTER — Telehealth: Payer: Self-pay | Admitting: Gastroenterology

## 2019-06-11 ENCOUNTER — Ambulatory Visit: Payer: Medicare HMO | Admitting: Gastroenterology

## 2019-06-11 ENCOUNTER — Encounter: Payer: Self-pay | Admitting: Gastroenterology

## 2019-06-11 VITALS — BP 120/70 | HR 75 | Temp 98.4°F | Ht 63.0 in | Wt 156.0 lb

## 2019-06-11 DIAGNOSIS — K5902 Outlet dysfunction constipation: Secondary | ICD-10-CM

## 2019-06-11 DIAGNOSIS — K581 Irritable bowel syndrome with constipation: Secondary | ICD-10-CM | POA: Diagnosis not present

## 2019-06-11 DIAGNOSIS — Z860100 Personal history of colon polyps, unspecified: Secondary | ICD-10-CM | POA: Insufficient documentation

## 2019-06-11 MED ORDER — HYDROCORTISONE ACETATE 25 MG RE SUPP: 25.0000 mg | Freq: Every day | RECTAL | 3 refills | Status: DC

## 2019-06-11 MED ORDER — NA SULFATE-K SULFATE-MG SULF 17.5-3.13-1.6 GM/177ML PO SOLN: 1.0000 | Freq: Once | ORAL | 0 refills | Status: AC

## 2019-06-11 NOTE — Progress Notes (Signed)
Nancy Herman    RC:[id number]    1945-09-03  Primary Care Physician:Jordan, Malka So, MD  Referring Physician: Martinique, Betty G, MD [place],  [place] [address]   Chief complaint: IBS constipation  HPI:  74 year old female with history of chronic IBS predominant constipation here for follow-up visit.  She is currently taking MiraLAX 1 capful daily and is having regular 1-2 bowel movements daily.  On Sunday she has sensation of incomplete evacuation and has to go back to commode few times.  She has intermittent bright red blood per rectum, she thinks it is coming from hemorrhoids.  Anorectal manometry consistent with dyssynergy defecation.  She completed pelvic floor physical therapy with biofeedback  Relevant Hx: She is status post TAH/BSO and also had rectocele repair in 2016 Colonoscopy March 31, 2014 in [place] showed internal hemorrhoids, mild proctitis biopsies showed colonic mucosal hyperplasia and fibrosis suggestive of rectal prolapse, tubular adenoma removed from ascending colon.  Recommend recall colonoscopy in 5 years EGD March 31, 2014 showed gastritis, duodenitis and small hiatal hernia EGD August 12, 2016 by Dr. Collene Mares showed small hiatal hernia otherwise normal exam   Outpatient Encounter Medications as of 06/11/2019  Medication Sig  . Calcium Carbonate-Vit D-Min (CALCIUM 1200 PO) Take 1 tablet by mouth daily after breakfast.   . carbamide peroxide (DEBROX) 6.5 % OTIC solution Place 5 drops into the left ear 2 (two) times daily.  Marland Kitchen FLUoxetine (PROZAC) 20 MG capsule Take 1 capsule (20 mg total) by mouth daily.  . montelukast (SINGULAIR) 10 MG tablet Take 1 tablet (10 mg total) by mouth at bedtime.  . pantoprazole (PROTONIX) 40 MG tablet Take 1 tablet by mouth once daily  . polyethylene glycol (MIRALAX) packet Take 17 g by mouth daily.  . pravastatin (PRAVACHOL) 20 MG tablet Take 1 tablet by mouth once daily  . risperiDONE  (RISPERDAL) 0.25 MG tablet Take 1 tablet (0.25 mg total) by mouth daily.  . traZODone (DESYREL) 50 MG tablet Take 1 tablet (50 mg total) by mouth at bedtime.   No facility-administered encounter medications on file as of 06/11/2019.    Allergies as of 06/11/2019 - Review Complete 06/11/2019  Allergen Reaction Noted  . Other Other (See Comments) 10/28/2017    Past Medical History:  Diagnosis Date  . Anxiety   . Frequent headaches   . GERD (gastroesophageal reflux disease)   . Hyperlipidemia   . IBS (irritable bowel syndrome)   . 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 Thyroid disease     Past Surgical History:  Procedure Laterality Date  . ABDOMINAL HYSTERECTOMY  1990  . ANAL RECTAL MANOMETRY N/A 08/14/2018   Procedure: ANO RECTAL MANOMETRY;  Surgeon: Mauri Pole, MD;  Location: [facility];  Service: Endoscopy;  Laterality: N/A;  . BRAIN SURGERY    . BREAST EXCISIONAL BIOPSY Right unsure  . BREAST EXCISIONAL BIOPSY Left unsure  . OVARIAN CYST SURGERY  1994  . REPAIR RECTOCELE  2016   and prolapsed uterus.  . TONSILLECTOMY  1973    Family History  Problem Relation Age of Onset  . Heart disease Mother        No details   . Alcohol abuse Mother   . Drug abuse Mother   . Sickle cell anemia Mother   . Heart disease Father        No details . Questionable bypass or stent  . Drug abuse Father   .  Alcohol abuse Father     Social History   Socioeconomic History  . Marital status: Widowed    Spouse name: Not on file  . Number of children: 2  . Years of education: Not on file  . Highest education level: Not on file  Occupational History  . Occupation: retired  Tobacco Use  . Smoking status: Never Smoker  . Smokeless tobacco: Never Used  Substance and Sexual Activity  . Alcohol use: No  . Drug use: No  . Sexual activity: Never  Other Topics Concern  . Not on file  Social History Narrative   Lives alone.  Lives near son.  Was in [place].   Social Determinants of  Health   Financial Resource Strain:   . Difficulty of Paying Living Expenses:   Food Insecurity:   . Worried About Charity fundraiser in the Last Year:   . Arboriculturist in the Last Year:   Transportation Needs:   . Film/video editor (Medical):   Marland Kitchen Lack of Transportation (Non-Medical):   Physical Activity:   . Days of Exercise per Week:   . Minutes of Exercise per Session:   Stress:   . Feeling of Stress :   Social Connections:   . Frequency of Communication with Friends and Family:   . Frequency of Social Gatherings with Friends and Family:   . Attends Religious Services:   . Active Member of Clubs or Organizations:   . Attends Archivist Meetings:   Marland Kitchen Marital Status:   Intimate Partner Violence:   . Fear of Current or Ex-Partner:   . Emotionally Abused:   Marland Kitchen Physically Abused:   . Sexually Abused:       Review of systems:  All other review of systems negative except as mentioned in the HPI.   Physical Exam: Vitals:   06/11/19 0830  BP: 120/70  Pulse: 75  Temp: 98.4 F (36.9 C)   Body mass index is 27.63 kg/m. Gen:      No acute distress Abd:      soft, non-tender; no palpable masses, no distension Ext:    No edema; adequate peripheral perfusion Skin:      Warm and dry; no rash Neuro: alert and oriented x 3 Psych: normal mood and affect  Data Reviewed:  Reviewed labs, radiology imaging, old records and pertinent past GI work up   Assessment and Plan/Recommendations:  74 year old very pleasant female with history of pituitary adenoma s/p resection, TAH/BSO s/p rectocele repair, pelvic floor dysfunction with dyssynergic defecation and chronic irritable bowel syndrome predominant constipation  Constipation: secondary to dyssynergic defecation and chronic irritable bowel syndrome Continue MiraLAX 1 capful daily Continue high-fiber diet and increase fluid intake  History of adenomatous colon polyps, last colonoscopy in [place] in  February 2016.  Full report/photographs not available.  She had one ascending colon tubular adenoma removed, no documentation of the size. We will proceed with recall colonoscopy in 5 years as recommended by her prior gastroenterologist Intermittent bright red blood per rectum likely secondary to small-volume bleeding from internal hemorrhoids.  Will further evaluate during colonoscopy.  The risks and benefits as well as alternatives of endoscopic procedure(s) have been discussed and reviewed. All questions answered. The patient agrees to proceed.     The patient was provided an opportunity to ask questions and all were answered. The patient agreed with the plan and demonstrated an understanding of the instructions.  Damaris Hippo , MD  CC: Martinique, Betty G, MD

## 2019-06-11 NOTE — Telephone Encounter (Signed)
Contacted the [facility] and spoke Drai   They did not run prep claim with the suprep code that was e-scribed.  They resent the prep to patients insurance while I was on the phone, cost is 97.00

## 2019-06-11 NOTE — Patient Instructions (Signed)
If you are age 72 or older, your body mass index should be between 23-30. Your Body mass index is 27.63 kg/m. If this is out of the aforementioned range listed, please consider follow up with your Primary Care Provider.  If you are age 108 or younger, your body mass index should be between 19-25. Your Body mass index is 27.63 kg/m. If this is out of the aformentioned range listed, please consider follow up with your Primary Care Provider.   We have sent the following medications to your pharmacy for you to pick up at your convenience:  We have sent to your pharmacy Anusol suppositories. If they are not covered by your insurance and the expense is to great please use Preparation H suppositories, which are available over the counter.  Due to recent changes in healthcare laws, you may see the results of your imaging and laboratory studies on MyChart before your provider has had a chance to review them.  We understand that in some cases there may be results that are confusing or concerning to you. Not all laboratory results come back in the same time frame and the provider may be waiting for multiple results in order to interpret others.  Please give [place] 48 hours in order for your provider to thoroughly review all the results before contacting the office for clarification of your results.

## 2019-06-14 NOTE — Telephone Encounter (Signed)
Contacted [facility] and spoke with pharmacist. Unable to get suprep covered for less. She was also unable to tell me which prep is covered by her insurance. Cancelled the suprep order and I will give the patient a Plenvu sample with new instructions.  Left a message on the patients voice mail to call the office so that I can make her aware of the new prep.

## 2019-06-14 NOTE — Telephone Encounter (Signed)
Pharmacy is not open right now, need to cancel suprep and order a new prep that will be covered by her insurance.

## 2019-06-14 NOTE — Telephone Encounter (Signed)
-----   Message from Letta Pate, [place] sent at 06/11/2019  4:47 PM EDT ----- Regarding: change to sutab Chang to sutab and send new directions.

## 2019-06-16 NOTE — Telephone Encounter (Signed)
Contacted Nancy Herman she will come to the office today to pick up her prep and new instructions.

## 2019-07-16 ENCOUNTER — Encounter: Payer: Self-pay | Admitting: Gastroenterology

## 2019-07-28 ENCOUNTER — Ambulatory Visit (AMBULATORY_SURGERY_CENTER): Payer: Medicare HMO | Admitting: Gastroenterology

## 2019-07-28 ENCOUNTER — Other Ambulatory Visit: Payer: Self-pay

## 2019-07-28 ENCOUNTER — Encounter: Payer: Self-pay | Admitting: Gastroenterology

## 2019-07-28 VITALS — BP 133/72 | HR 90 | Temp 98.4°F | Resp 20 | Ht 63.0 in | Wt 156.0 lb

## 2019-07-28 DIAGNOSIS — Z860100 Personal history of colon polyps, unspecified: Secondary | ICD-10-CM

## 2019-07-28 DIAGNOSIS — D120 Benign neoplasm of cecum: Secondary | ICD-10-CM | POA: Diagnosis not present

## 2019-07-28 DIAGNOSIS — K6289 Other specified diseases of anus and rectum: Secondary | ICD-10-CM

## 2019-07-28 DIAGNOSIS — D123 Benign neoplasm of transverse colon: Secondary | ICD-10-CM

## 2019-07-28 DIAGNOSIS — D125 Benign neoplasm of sigmoid colon: Secondary | ICD-10-CM

## 2019-07-28 DIAGNOSIS — K629 Disease of anus and rectum, unspecified: Secondary | ICD-10-CM

## 2019-07-28 MED ORDER — SODIUM CHLORIDE 0.9 % IV SOLN: 500.0000 mL | Freq: Once | INTRAVENOUS | Status: DC

## 2019-07-28 NOTE — Patient Instructions (Signed)
Handout on polyps and diverticulosis given    YOU HAD AN ENDOSCOPIC PROCEDURE TODAY AT THE [facility]:   Refer to the procedure report that was given to you for any specific questions about what was found during the examination.  If the procedure report does not answer your questions, please call your gastroenterologist to clarify.  If you requested that your care partner not be given the details of your procedure findings, then the procedure report has been included in a sealed envelope for you to review at your convenience later.  YOU SHOULD EXPECT: Some feelings of bloating in the abdomen. Passage of more gas than usual.  Walking can help get rid of the air that was put into your GI tract during the procedure and reduce the bloating. If you had a lower endoscopy (such as a colonoscopy or flexible sigmoidoscopy) you may notice spotting of blood in your stool or on the toilet paper. If you underwent a bowel prep for your procedure, you may not have a normal bowel movement for a few days.  Please Note:  You might notice some irritation and congestion in your nose or some drainage.  This is from the oxygen used during your procedure.  There is no need for concern and it should clear up in a day or so.  SYMPTOMS TO REPORT IMMEDIATELY:   Following lower endoscopy (colonoscopy or flexible sigmoidoscopy):  Excessive amounts of blood in the stool  Significant tenderness or worsening of abdominal pains  Swelling of the abdomen that is new, acute  Fever of 100F or higher   For urgent or emergent issues, a gastroenterologist can be reached at any hour by calling [phone]. Do not use MyChart messaging for urgent concerns.    DIET:  We do recommend a small meal at first, but then you may proceed to your regular diet.  Drink plenty of fluids but you should avoid alcoholic beverages for 24 hours.  ACTIVITY:  You should plan to take it easy for the rest of today and you should NOT  DRIVE or use heavy machinery until tomorrow (because of the sedation medicines used during the test).    FOLLOW UP: Our staff will call the number listed on your records 48-72 hours following your procedure to check on you and address any questions or concerns that you may have regarding the information given to you following your procedure. If we do not reach you, we will leave a message.  We will attempt to reach you two times.  During this call, we will ask if you have developed any symptoms of COVID 19. If you develop any symptoms (ie: fever, flu-like symptoms, shortness of breath, cough etc.) before then, please call [phone].  If you test positive for Covid 19 in the 2 weeks post procedure, please call and report this information to us.    If any biopsies were taken you will be contacted by phone or by letter within the next 1-3 weeks.  Please call us at [phone] if you have not heard about the biopsies in 3 weeks.    SIGNATURES/CONFIDENTIALITY: You and/or your care partner have signed paperwork which will be entered into your electronic medical record.  These signatures attest to the fact that that the information above on your After Visit Summary has been reviewed and is understood.  Full responsibility of the confidentiality of this discharge information lies with you and/or your care-partner. 

## 2019-07-28 NOTE — Op Note (Signed)
[facility] Patient Name: Nancy Herman Procedure Date: 07/28/2019 1:13 PM MRN: [id number] Endoscopist: Mauri Pole , MD Age: 74 Referring MD:  Date of Birth: 12-04-1945 Gender: Female Account #: [id number] Procedure:                Colonoscopy Indications:              High risk colon cancer surveillance: Personal                            history of colonic polyps, Last colonoscopy: 2016 Medicines:                Monitored Anesthesia Care Procedure:                Pre-Anesthesia Assessment:                           - Prior to the procedure, a History and Physical                            was performed, and patient medications and                            allergies were reviewed. The patient's tolerance of                            previous anesthesia was also reviewed. The risks                            and benefits of the procedure and the sedation                            options and risks were discussed with the patient.                            All questions were answered, and informed consent                            was obtained. Prior Anticoagulants: The patient has                            taken no previous anticoagulant or antiplatelet                            agents. ASA Grade Assessment: II - A patient with                            mild systemic disease. After reviewing the risks                            and benefits, the patient was deemed in                            satisfactory condition to undergo the procedure.  After obtaining informed consent, the colonoscope                            was passed under direct vision. Throughout the                            procedure, the patient's blood pressure, pulse, and                            oxygen saturations were monitored continuously. The                            Colonoscope was introduced through the anus and                             advanced to the the cecum, identified by                            appendiceal orifice and ileocecal valve. The                            colonoscopy was performed without difficulty. The                            patient tolerated the procedure well. The quality                            of the bowel preparation was excellent. The                            ileocecal valve, appendiceal orifice, and rectum                            were photographed. Scope In: 1:29:19 PM Scope Out: 1:48:22 PM Scope Withdrawal Time: 0 hours 11 minutes 25 seconds  Total Procedure Duration: 0 hours 19 minutes 3 seconds  Findings:                 The perianal and digital rectal examinations were                            normal.                           Two sessile polyps were found in the transverse                            colon and cecum. The polyps were 1 to 2 mm in size.                            These polyps were removed with a cold biopsy                            forceps. Resection and retrieval were complete.  A 5 mm polyp was found in the sigmoid colon. The                            polyp was sessile. The polyp was removed with a                            cold snare. Resection and retrieval were complete.                           A patchy area of mildly erythematous mucosa was                            found in the rectum. Biopsies were taken with a                            cold forceps for histology.                           A few small-mouthed diverticula were found in the                            sigmoid colon.                           Non-bleeding internal hemorrhoids were found during                            retroflexion. The hemorrhoids were small. Complications:            No immediate complications. Estimated Blood Loss:     Estimated blood loss was minimal. Impression:               - Two 1 to 2 mm polyps in the transverse colon and                             in the cecum, removed with a cold biopsy forceps.                            Resected and retrieved.                           - One 5 mm polyp in the sigmoid colon, removed with                            a cold snare. Resected and retrieved.                           - Erythematous mucosa in the rectum. Biopsied.                           - Diverticulosis in the sigmoid colon.                           - Non-bleeding internal hemorrhoids. Recommendation:           -  Patient has a contact number available for                            emergencies. The signs and symptoms of potential                            delayed complications were discussed with the                            patient. Return to normal activities tomorrow.                            Written discharge instructions were provided to the                            patient.                           - Resume previous diet.                           - Continue present medications.                           - Await pathology results.                           - Repeat colonoscopy in 3 - 10 years for                            surveillance based on pathology results. Mauri Pole, MD 07/28/2019 1:54:25 PM This report has been signed electronically.

## 2019-07-28 NOTE — Progress Notes (Signed)
Pt pressure low.  Dr Silverio Decamp notified.  Ephedrine given

## 2019-07-28 NOTE — Progress Notes (Signed)
Called to room to assist during endoscopic procedure.  Patient ID and intended procedure confirmed with present staff. Received instructions for my participation in the procedure from the performing physician.  

## 2019-07-28 NOTE — Progress Notes (Signed)
Pt's states no medical or surgical changes since previsit or office visit. 

## 2019-07-28 NOTE — Progress Notes (Signed)
Report to PACU, RN, vss, BBS= Clear.  

## 2019-07-30 ENCOUNTER — Telehealth: Payer: Self-pay | Admitting: *Deleted

## 2019-07-30 NOTE — Telephone Encounter (Signed)
  Follow up Call-  Call back number 07/28/2019  Post procedure Call Back phone  # [phone]  Permission to leave phone message Yes  Some recent data might be hidden     Patient questions:  Do you have a fever, pain , or abdominal swelling? No. Pain Score  0 *  Have you tolerated food without any problems? Yes.    Have you been able to return to your normal activities? Yes.    Do you have any questions about your discharge instructions: Diet   No. Medications  No. Follow up visit  No.  Do you have questions or concerns about your Care? No.  Actions: * If pain score is 4 or above: No action needed, pain <4.  1. Have you developed a fever since your procedure? no  2.   Have you had an respiratory symptoms (SOB or cough) since your procedure? no  3.   Have you tested positive for COVID 19 since your procedure no  4.   Have you had any family members/close contacts diagnosed with the COVID 19 since your procedure? no   If yes to any of these questions please route to Joylene John, RN and Erenest Rasher, RN

## 2019-08-05 ENCOUNTER — Encounter: Payer: Self-pay | Admitting: Gastroenterology

## 2019-08-05 ENCOUNTER — Telehealth: Payer: Self-pay | Admitting: Gastroenterology

## 2019-08-05 NOTE — Telephone Encounter (Signed)
Patient called for path results 

## 2019-08-06 NOTE — Telephone Encounter (Signed)
Spoke with patient regarding pathology results, read letter from Dr. Silverio Decamp. Pt advised to call with any other questions or concerns.

## 2019-08-19 ENCOUNTER — Other Ambulatory Visit: Payer: Self-pay | Admitting: Family Medicine

## 2019-08-19 DIAGNOSIS — N641 Fat necrosis of breast: Secondary | ICD-10-CM

## 2019-08-19 DIAGNOSIS — Z780 Asymptomatic menopausal state: Secondary | ICD-10-CM

## 2019-09-03 ENCOUNTER — Other Ambulatory Visit: Payer: Self-pay

## 2019-09-03 ENCOUNTER — Ambulatory Visit (INDEPENDENT_AMBULATORY_CARE_PROVIDER_SITE_OTHER): Payer: Medicare HMO | Admitting: Psychiatry

## 2019-09-03 DIAGNOSIS — F329 Major depressive disorder, single episode, unspecified: Secondary | ICD-10-CM

## 2019-09-03 DIAGNOSIS — F411 Generalized anxiety disorder: Secondary | ICD-10-CM | POA: Diagnosis not present

## 2019-09-03 DIAGNOSIS — F3342 Major depressive disorder, recurrent, in full remission: Secondary | ICD-10-CM

## 2019-09-03 DIAGNOSIS — F32A Depression, unspecified: Secondary | ICD-10-CM

## 2019-09-03 MED ORDER — FLUOXETINE HCL 20 MG PO CAPS: 20.0000 mg | ORAL_CAPSULE | Freq: Every day | ORAL | 5 refills | Status: DC

## 2019-09-03 NOTE — Progress Notes (Signed)
Psychiatric Initial Adult Assessment   Patient Identification: Nancy Herman MRN:  [id number] Date of Evaluation:  09/03/2019 Referral Source: Dr. Betty Martinique Chief Complaint:   Visit Diagnosis:    ICD-10-CM   1. Depressive disorder  F32.9 FLUoxetine (PROZAC) 20 MG capsule  2. Generalized anxiety disorder  F41.1 FLUoxetine (PROZAC) 20 MG capsule    History of Present Illness:  Today the patient is doing very well is not having any more issues with her neighbors.  Patient is sleeping and eating well.  She still has a popping that she is taking care.  She still reads the Bible a lot watches TV and is functioning very well.  Her irritable bowel seems to be well controlled.  The patient has 5 grandchildren and 2 great-grandchildren and 2 adult children all of whom are doing well.  Patient likes where she lives.  Her health is stable.  Her mood is positive and optimistic.  She has no evidence of psychosis.   Depression Symptoms:  difficulty concentrating, (Hypo) Manic Symptoms:   Anxiety Symptoms:   Psychotic Symptoms:   PTSD Symptoms:   Past Psychiatric History: Prozac for many years  Previous Psychotropic Medications:   Substance Abuse History in the last 12 months:  No.  Consequences of Substance Abuse:   Past Medical History:  Past Medical History:  Diagnosis Date  . Anxiety   . Frequent headaches   . GERD (gastroesophageal reflux disease)   . Hyperlipidemia   . IBS (irritable bowel syndrome)   . 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 Thyroid disease     Past Surgical History:  Procedure Laterality Date  . ABDOMINAL HYSTERECTOMY  1990  . ANAL RECTAL MANOMETRY N/A 08/14/2018   Procedure: ANO RECTAL MANOMETRY;  Surgeon: Mauri Pole, MD;  Location: [facility];  Service: Endoscopy;  Laterality: N/A;  . BRAIN SURGERY    . BREAST EXCISIONAL BIOPSY Right unsure  . BREAST EXCISIONAL BIOPSY Left unsure  . OVARIAN CYST SURGERY  1994  . REPAIR RECTOCELE  2016   and  prolapsed uterus.  . TONSILLECTOMY  1973    Family Psychiatric History:   Family History:  Family History  Problem Relation Age of Onset  . Heart disease Mother        No details   . Alcohol abuse Mother   . Drug abuse Mother   . Sickle cell anemia Mother   . Heart disease Father        No details . Questionable bypass or stent  . Drug abuse Father   . Alcohol abuse Father     Social History:   Social History   Socioeconomic History  . Marital status: Widowed    Spouse name: Not on file  . Number of children: 2  . Years of education: Not on file  . Highest education level: Not on file  Occupational History  . Occupation: retired  Tobacco Use  . Smoking status: Never Smoker  . Smokeless tobacco: Never Used  Vaping Use  . Vaping Use: Never used  Substance and Sexual Activity  . Alcohol use: No  . Drug use: No  . Sexual activity: Never  Other Topics Concern  . Not on file  Social History Narrative   Lives alone.  Lives near son.  Was in [place].   Social Determinants of Health   Financial Resource Strain:   . Difficulty of Paying Living Expenses:   Food Insecurity:   . Worried About Charity fundraiser in  the Last Year:   . [place] in the Last Year:   Transportation Needs:   . Film/video editor (Medical):   Marland Kitchen Lack of Transportation (Non-Medical):   Physical Activity:   . Days of Exercise per Week:   . Minutes of Exercise per Session:   Stress:   . Feeling of Stress :   Social Connections:   . Frequency of Communication with Friends and Family:   . Frequency of Social Gatherings with Friends and Family:   . Attends Religious Services:   . Active Member of Clubs or Organizations:   . Attends Archivist Meetings:   Marland Kitchen Marital Status:     Additional Social History:   Allergies:   Allergies  Allergen Reactions  . Other Other (See Comments)    Seasonal allergies    Metabolic Disorder Labs: Lab Results  Component Value Date    HGBA1C 5.9 11/09/2018   Lab Results  Component Value Date   PROLACTIN 14.6 03/05/2019   PROLACTIN 5.0 03/04/2018   Lab Results  Component Value Date   CHOL 207 (H) 11/09/2018   TRIG 85.0 11/09/2018   HDL 74.40 11/09/2018   CHOLHDL 3 11/09/2018   VLDL 17.0 11/09/2018   LDLCALC 116 (H) 11/09/2018   LDLCALC 122 (H) 12/08/2017   Lab Results  Component Value Date   TSH 2.49 03/05/2019    Therapeutic Level Labs: No results found for: LITHIUM No results found for: CBMZ No results found for: VALPROATE  Current Medications: Current Outpatient Medications  Medication Sig Dispense Refill  . Calcium Carbonate-Vit D-Min (CALCIUM 1200 PO) Take 1 tablet by mouth daily after breakfast.     . carbamide peroxide (DEBROX) 6.5 % OTIC solution Place 5 drops into the left ear 2 (two) times daily. 15 mL 0  . FLUoxetine (PROZAC) 20 MG capsule Take 1 capsule (20 mg total) by mouth daily. 30 capsule 5  . hydrocortisone (ANUSOL-HC) 25 MG suppository Place 1 suppository (25 mg total) rectally at bedtime. 12 suppository 3  . montelukast (SINGULAIR) 10 MG tablet Take 1 tablet (10 mg total) by mouth at bedtime. 90 tablet 1  . pantoprazole (PROTONIX) 40 MG tablet Take 1 tablet by mouth once daily 30 tablet 5  . polyethylene glycol (MIRALAX) packet Take 17 g by mouth daily. 14 each 0  . pravastatin (PRAVACHOL) 20 MG tablet Take 1 tablet by mouth once daily 90 tablet 2  . risperiDONE (RISPERDAL) 0.25 MG tablet Take 1 tablet (0.25 mg total) by mouth daily. 30 tablet 3  . traZODone (DESYREL) 50 MG tablet Take 1 tablet (50 mg total) by mouth at bedtime. 30 tablet 5   No current facility-administered medications for this visit.    Musculoskeletal: Strength & Muscle Tone: within normal limits Gait & Station: normal Patient leans:   Psychiatric Specialty Exam: ROS  There were no vitals taken for this visit.There is no height or weight on file to calculate BMI.  General Appearance: Casual  Eye Contact:   Good  Speech: Good  Volume:  Normal  Mood:  NA  Affect:  NA and Appropriate  Thought Process:  Coherent  Orientation:  Full (Time, Place, and Person)  Thought Content:  Logical  Suicidal Thoughts:  No  Homicidal Thoughts:  No  Memory:  Negative  Judgement:  Good  Insight:  Fair  Psychomotor Activity:  Normal  Concentration:    Recall:  Good  Fund of Knowledge:Good  Language: Good  Akathisia:  No  Handed:    AIMS (if indicated):  not done  Assets:  Desire for Improvement  ADL's:  Intact  Cognition: WNL  Sleep:  Good   Screenings: PHQ2-9     Office Visit from 11/09/2018 in [place] at [facility] from 12/03/2017 in [place] at [facility] from 10/28/2017 in [place] at [facility] from 06/16/2017 in [place] at [facility] from 10/24/2016 in [place] at [facility] Total Score 0 0 0 0 0  PHQ-9 Total Score - - - 0 -      Assessment and Plan:  7/30/202110:08 AM   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  At this time it seems to be resolved.  At this time we will discontinue her Risperdal.  She was down to taking 25 mg 1 in the morning and noticed no loss of affect by changing it from being.  Therefore today we will discontinue it completely but continue on her Prozac 20 mg.  The patient was told that if anything changes she should call us back right away but the patient will be seen again in 2 to 3 months.  The patient is doing very well at this time.

## 2019-10-04 ENCOUNTER — Ambulatory Visit
Admission: RE | Admit: 2019-10-04 | Discharge: 2019-10-04 | Disposition: A | Discharge status: Home / Self Care | Payer: Medicare HMO | Source: Ambulatory Visit | Attending: Family Medicine | Admitting: Family Medicine

## 2019-10-04 ENCOUNTER — Other Ambulatory Visit: Payer: Self-pay

## 2019-10-04 DIAGNOSIS — N641 Fat necrosis of breast: Secondary | ICD-10-CM

## 2019-10-11 ENCOUNTER — Other Ambulatory Visit: Payer: Self-pay | Admitting: Family Medicine

## 2019-10-11 DIAGNOSIS — J309 Allergic rhinitis, unspecified: Secondary | ICD-10-CM

## 2019-10-29 ENCOUNTER — Other Ambulatory Visit: Payer: Self-pay | Admitting: Family Medicine

## 2019-10-29 DIAGNOSIS — K219 Gastro-esophageal reflux disease without esophagitis: Secondary | ICD-10-CM

## 2019-11-02 ENCOUNTER — Telehealth (HOSPITAL_COMMUNITY): Payer: Self-pay

## 2019-11-02 NOTE — Telephone Encounter (Signed)
Patient's son called regarding her Risperidone which she has been tapered off from. Son stated that she's having paranoia and thinks people are spying on her. He stated that she needs something to keep her sane. He would like her to go back on Risperidone or get her on something that's equivalent as soon as possible. She's using Insurance claims handler on [address] in [place]. Follow up scheduled for 12/03/19. Please review and advise. Thank you.

## 2019-11-03 ENCOUNTER — Other Ambulatory Visit (HOSPITAL_COMMUNITY): Payer: Self-pay

## 2019-11-03 ENCOUNTER — Telehealth (HOSPITAL_COMMUNITY): Payer: Self-pay | Admitting: *Deleted

## 2019-11-03 MED ORDER — RISPERIDONE 0.25 MG PO TABS: 0.2500 mg | ORAL_TABLET | Freq: Every day | ORAL | 3 refills | Status: DC

## 2019-11-03 NOTE — Telephone Encounter (Signed)
Patient called and stated she is hearing voices again.  She was ordered Risperdal.05 once a day. She has increased it to 1 BID and wants to know if this is ok.  Please advise.

## 2019-11-03 NOTE — Telephone Encounter (Signed)
I spoke with patient and sent in refill for her Risperidone 0.25mg  #30 3 refills per doctor to [facility]  on [address] in [place]. She stated that she took 1 tablet qhs and had a good night's sleep. She inquired about taking 2 if it becomes necessary. I told her to take 1 tablet as prescribed since it worked well for her; however, if she finds she needs 2 to let me know and I can send a message to the doctor about changing the script or she could discuss it with him at her next appointment. Follow up scheduled for 12/03/19

## 2019-11-09 ENCOUNTER — Encounter: Payer: Self-pay | Admitting: Family Medicine

## 2019-11-09 ENCOUNTER — Other Ambulatory Visit: Payer: Self-pay

## 2019-11-09 ENCOUNTER — Ambulatory Visit (INDEPENDENT_AMBULATORY_CARE_PROVIDER_SITE_OTHER): Payer: Medicare HMO | Admitting: Family Medicine

## 2019-11-09 VITALS — BP 120/76 | HR 81 | Resp 12 | Ht 63.0 in | Wt 148.0 lb

## 2019-11-09 DIAGNOSIS — E785 Hyperlipidemia, unspecified: Secondary | ICD-10-CM | POA: Diagnosis not present

## 2019-11-09 DIAGNOSIS — K581 Irritable bowel syndrome with constipation: Secondary | ICD-10-CM | POA: Diagnosis not present

## 2019-11-09 DIAGNOSIS — Z23 Encounter for immunization: Secondary | ICD-10-CM | POA: Diagnosis not present

## 2019-11-09 DIAGNOSIS — E663 Overweight: Secondary | ICD-10-CM | POA: Diagnosis not present

## 2019-11-09 DIAGNOSIS — K219 Gastro-esophageal reflux disease without esophagitis: Secondary | ICD-10-CM | POA: Diagnosis not present

## 2019-11-09 MED ORDER — FAMOTIDINE 20 MG PO TABS: 20.0000 mg | ORAL_TABLET | Freq: Two times a day (BID) | ORAL | 1 refills | Status: DC

## 2019-11-09 NOTE — Patient Instructions (Addendum)
A few things to remember from today's visit:   Need for influenza vaccination - Plan: Flu Vaccine QUAD High Dose(Fluad)  If you need refills please call your pharmacy. Do not use My Chart to request refills or for acute issues that need immediate attention.    Please be sure medication list is accurate. If a new problem present, please set up appointment sooner than planned today.  Pepcid 20 mg at bedtime. No changes in Pantoprazole dose. A daily probiotic may help, Align 1 cap daily. Small meals. Some greens vegetable can increase gas production. Continue Miralax daily and can add Bisacodyl 5 mg daily as needed.  Next visit fasting.

## 2019-11-09 NOTE — Progress Notes (Signed)
HPI: [name] is a 73 y.o. female, who is here today for 6 months follow up.   She was last seen on 05/10/19.  HLD: She is on Pravastatin 20 mg daily. Tolerating medication well.  Lab Results  Component Value Date   CHOL 207 (H) 11/09/2018   HDL 74.40 11/09/2018   LDLCALC 116 (H) 11/09/2018   TRIG 85.0 11/09/2018   CHOLHDL 3 11/09/2018   She is taking something for gas, she thinks it is caused by PPI. Se is still having heartburn and acid reflux. Sometimes exacerbated by big portions.  Colonoscopy on 07/08/19. Follows with GI.  Cough, which she attributed to allergies. Negative for associated CP,SOB,or wheezing. No associated abdominal pain,nausea,or vomiting. + Bloating sensation. Takes Miralax daily prn for constipation. Defecation sometimes helps some.  She has symptoms "day and night." Dry mouth at night.  Allergic rhinitis: She is on Singulair 10 mg daily. + Post nasal drainage. Negative for nasal congestion or rhinorrhea.  She is not exercising regularly. In general she follows a healthful diet.  Bipolar disorder: She is on Fluoxetine 20 mg daily,Trazodone 50 mg,and Risperidone 0.25 mg daily at bedtime. She is following with psychiatrist. In general symptoms are stable, mood is "up and down." Sleeps about 4-5 hours most of the time.  She lives alone, her son checks on her regularly.  Review of Systems  Constitutional: Positive for fatigue. Negative for activity change, appetite change and fever.  HENT: Negative for mouth sores, nosebleeds, sore throat and trouble swallowing.   Eyes: Negative for redness and visual disturbance.  Cardiovascular: Negative for palpitations and leg swelling.  Gastrointestinal:       Negative for changes in bowel habits.  Genitourinary: Negative for decreased urine volume, dysuria and hematuria.  Musculoskeletal: Negative for gait problem and myalgias.  Allergic/Immunologic: Positive for environmental allergies.   Neurological: Negative for syncope and weakness.  Psychiatric/Behavioral: Negative for confusion. The patient is nervous/anxious.   Rest of ROS, see pertinent positives sand negatives in HPI  Current Outpatient Medications on File Prior to Visit  Medication Sig Dispense Refill  . Calcium Carbonate-Vit D-Min (CALCIUM 1200 PO) Take 1 tablet by mouth daily after breakfast.     . carbamide peroxide (DEBROX) 6.5 % OTIC solution Place 5 drops into the left ear 2 (two) times daily. (Patient not taking: Reported on 11/10/2019) 15 mL 0  . FLUoxetine (PROZAC) 20 MG capsule Take 1 capsule (20 mg total) by mouth daily. 30 capsule 5  . hydrocortisone (ANUSOL-HC) 25 MG suppository Place 1 suppository (25 mg total) rectally at bedtime. (Patient not taking: Reported on 11/10/2019) 12 suppository 3  . montelukast (SINGULAIR) 10 MG tablet TAKE 1 TABLET BY MOUTH AT BEDTIME 90 tablet 2  . pantoprazole (PROTONIX) 40 MG tablet Take 1 tablet by mouth once daily 90 tablet 1  . polyethylene glycol (MIRALAX) packet Take 17 g by mouth daily. 14 each 0  . pravastatin (PRAVACHOL) 20 MG tablet Take 1 tablet by mouth once daily 90 tablet 2  . risperiDONE (RISPERDAL) 0.25 MG tablet Take 1 tablet (0.25 mg total) by mouth daily. 30 tablet 3  . traZODone (DESYREL) 50 MG tablet Take 1 tablet (50 mg total) by mouth at bedtime. 30 tablet 5   No current facility-administered medications on file prior to visit.   Past Medical History:  Diagnosis Date  . Anxiety   . Frequent headaches   . GERD (gastroesophageal reflux disease)   . Hyperlipidemia   . IBS (  irritable bowel syndrome)   . 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 Thyroid disease    Allergies  Allergen Reactions  . Other Other (See Comments)    Seasonal allergies    Social History   Socioeconomic History  . Marital status: Widowed    Spouse name: Not on file  . Number of children: 2  . Years of education: Not on file  . Highest education level: Not on file   Occupational History  . Occupation: retired  Tobacco Use  . Smoking status: Never Smoker  . Smokeless tobacco: Never Used  Vaping Use  . Vaping Use: Never used  Substance and Sexual Activity  . Alcohol use: No  . Drug use: No  . Sexual activity: Never  Other Topics Concern  . Not on file  Social History Narrative   Lives alone.  Lives near son.  Was in [place].   Social Determinants of Health   Financial Resource Strain: Low Risk   . Difficulty of Paying Living Expenses: Not hard at all  Food Insecurity: No Food Insecurity  . Worried About Charity fundraiser in the Last Year: Never true  . Ran Out of Food in the Last Year: Never true  Transportation Needs: No Transportation Needs  . Lack of Transportation (Medical): No  . Lack of Transportation (Non-Medical): No  Physical Activity: Insufficiently Active  . Days of Exercise per Week: 3 days  . Minutes of Exercise per Session: 20 min  Stress: No Stress Concern Present  . Feeling of Stress : Only a little  Social Connections: Socially Isolated  . Frequency of Communication with Friends and Family: More than three times a week  . Frequency of Social Gatherings with Friends and Family: Never  . Attends Religious Services: Never  . Active Member of Clubs or Organizations: No  . Attends Archivist Meetings: Never  . Marital Status: Widowed    Vitals:   11/09/19 0942  BP: 120/76  Pulse: 81  Resp: 12  SpO2: 96%   Body mass index is 26.22 kg/m.  Physical Exam Vitals and nursing note reviewed.  Constitutional:      General: She is not in acute distress.    Appearance: She is well-developed.  HENT:     Head: Normocephalic and atraumatic.     Mouth/Throat:     Mouth: Mucous membranes are moist.     Pharynx: Oropharynx is clear.  Eyes:     Conjunctiva/sclera: Conjunctivae normal.     Pupils: Pupils are equal, round, and reactive to light.  Cardiovascular:     Rate and Rhythm: Normal rate and regular rhythm.      Heart sounds: No murmur heard.   Pulmonary:     Effort: Pulmonary effort is normal. No respiratory distress.     Breath sounds: Normal breath sounds.  Abdominal:     Palpations: Abdomen is soft. There is no hepatomegaly or mass.     Tenderness: There is no abdominal tenderness.  Lymphadenopathy:     Cervical: No cervical adenopathy.  Skin:    General: Skin is warm.     Findings: No erythema or rash.  Neurological:     Mental Status: She is alert and oriented to person, place, and time.     Cranial Nerves: No cranial nerve deficit.     Gait: Gait normal.  Psychiatric:        Mood and Affect: Affect normal. Mood is anxious.     Comments: Well groomed, good  eye contact.   ASSESSMENT AND PLAN:  Ms. Nancy Herman was seen today for 6 months follow-up.  Orders Placed This Encounter  Procedures  . Flu Vaccine QUAD High Dose(Fluad)   Gastroesophageal reflux disease without esophagitis We discussed some side effects of PPI's. Still having symptoms,so for now continue Pantoprazole 40 mg Famotidine added today to take at night. GERD precautions.  -     famotidine (PEPCID) 20 MG tablet; Take 1 tablet (20 mg total) by mouth 2 (two) times daily.  Irritable bowel syndrome with constipation Otherwise stable. Continue adequate fiber and fluid intake. Miralax daily as needed. Daily probiotic may help with bloating sensation.  Hyperlipidemia, unspecified hyperlipidemia type She is not fasting today. Continue Pravastatin 20 mg daily.  Overweight (BMI 25.0-29.9) Walking in place is a good option for exercise for 15 min a few times per week. Small portions. Since 05/2019 she has lost about 6 Lb since her last visit in 05/2019.  Need for influenza vaccination -     Flu Vaccine QUAD High Dose(Fluad)   Return in about 6 months (around 05/12/2020) for cpe.   Braxton Weisbecker G. Martinique, MD  [facility]. [place] office.  A few things to remember from today's  visit:   Need for influenza vaccination - Plan: Flu Vaccine QUAD High Dose(Fluad)  If you need refills please call your pharmacy. Do not use My Chart to request refills or for acute issues that need immediate attention.    Please be sure medication list is accurate. If a new problem present, please set up appointment sooner than planned today.  Pepcid 20 mg at bedtime. No changes in Pantoprazole dose. A daily probiotic may help, Align 1 cap daily. Small meals. Some greens vegetable can increase gas production. Continue Miralax daily and can add Bisacodyl 5 mg daily as needed.  Next visit fasting.

## 2019-11-10 ENCOUNTER — Ambulatory Visit (INDEPENDENT_AMBULATORY_CARE_PROVIDER_SITE_OTHER): Payer: Medicare HMO

## 2019-11-10 DIAGNOSIS — Z0000 Encounter for general adult medical examination without abnormal findings: Secondary | ICD-10-CM

## 2019-11-10 NOTE — Progress Notes (Signed)
Virtual Visit via Telephone Note  I connected with  Nancy Herman on 11/10/19 at 11:00 AM EDT by telephone and verified that I am speaking with the correct person using two identifiers.  Medicare Annual Wellness visit completed telephonically due to Covid-19 pandemic.   Persons participating in this call: This Health Coach and this patient.   Location: Patient: Home Provider: Office   I discussed the limitations, risks, security and privacy concerns of performing an evaluation and management service by telephone and the availability of in person appointments. The patient expressed understanding and agreed to proceed.  Unable to perform video visit due to video visit attempted and failed and/or patient does not have video capability.   Some vital signs may be absent or patient reported.   Willette Brace, LPN    Subjective:   Nancy Herman is a 74 y.o. female who presents for Medicare Annual (Subsequent) preventive examination.  Review of Systems     Cardiac Risk Factors include: advanced age (>41men, >15 women);dyslipidemia     Objective:    There were no vitals filed for this visit. There is no height or weight on file to calculate BMI.  Advanced Directives 11/10/2019 09/29/2018 03/17/2018 03/17/2018 03/16/2018 02/27/2018 10/28/2017  Does Patient Have a Medical Advance Directive? Yes Yes No No - No No  Type of Advance Directive Healthcare Power of Attorney Living will - - - - -  Does patient want to make changes to medical advance directive? - No - Patient declined - - - - -  Copy of [place] in Chart? No - copy requested - - - - - -  Would patient like information on creating a medical advance directive? - - No - Patient declined - No - Patient declined No - Patient declined -    Current Medications (verified) Outpatient Encounter Medications as of 11/10/2019  Medication Sig  . Calcium Carbonate-Vit D-Min (CALCIUM 1200 PO) Take 1 tablet by  mouth daily after breakfast.   . famotidine (PEPCID) 20 MG tablet Take 1 tablet (20 mg total) by mouth 2 (two) times daily.  Marland Kitchen FLUoxetine (PROZAC) 20 MG capsule Take 1 capsule (20 mg total) by mouth daily.  . montelukast (SINGULAIR) 10 MG tablet TAKE 1 TABLET BY MOUTH AT BEDTIME  . pantoprazole (PROTONIX) 40 MG tablet Take 1 tablet by mouth once daily  . polyethylene glycol (MIRALAX) packet Take 17 g by mouth daily.  . pravastatin (PRAVACHOL) 20 MG tablet Take 1 tablet by mouth once daily  . risperiDONE (RISPERDAL) 0.25 MG tablet Take 1 tablet (0.25 mg total) by mouth daily.  . traZODone (DESYREL) 50 MG tablet Take 1 tablet (50 mg total) by mouth at bedtime.  . carbamide peroxide (DEBROX) 6.5 % OTIC solution Place 5 drops into the left ear 2 (two) times daily. (Patient not taking: Reported on 11/10/2019)  . hydrocortisone (ANUSOL-HC) 25 MG suppository Place 1 suppository (25 mg total) rectally at bedtime. (Patient not taking: Reported on 11/10/2019)   No facility-administered encounter medications on file as of 11/10/2019.    Allergies (verified) Other   History: Past Medical History:  Diagnosis Date  . Anxiety   . Frequent headaches   . GERD (gastroesophageal reflux disease)   . Hyperlipidemia   . IBS (irritable bowel syndrome)   . 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 Thyroid disease    Past Surgical History:  Procedure Laterality Date  . ABDOMINAL HYSTERECTOMY  1990  . ANAL RECTAL MANOMETRY N/A 08/14/2018  Procedure: ANO RECTAL MANOMETRY;  Surgeon: Mauri Pole, MD;  Location: [facility];  Service: Endoscopy;  Laterality: N/A;  . BRAIN SURGERY    . BREAST EXCISIONAL BIOPSY Right unsure  . BREAST EXCISIONAL BIOPSY Left unsure  . OVARIAN CYST SURGERY  1994  . REPAIR RECTOCELE  2016   and prolapsed uterus.  . TONSILLECTOMY  1973   Family History  Problem Relation Age of Onset  . Heart disease Mother        No details   . Alcohol abuse Mother   . Drug abuse Mother   .  Sickle cell anemia Mother   . Heart disease Father        No details . Questionable bypass or stent  . Drug abuse Father   . Alcohol abuse Father    Social History   Socioeconomic History  . Marital status: Widowed    Spouse name: Not on file  . Number of children: 2  . Years of education: Not on file  . Highest education level: Not on file  Occupational History  . Occupation: retired  Tobacco Use  . Smoking status: Never Smoker  . Smokeless tobacco: Never Used  Vaping Use  . Vaping Use: Never used  Substance and Sexual Activity  . Alcohol use: No  . Drug use: No  . Sexual activity: Never  Other Topics Concern  . Not on file  Social History Narrative   Lives alone.  Lives near son.  Was in [place].   Social Determinants of Health   Financial Resource Strain: Low Risk   . Difficulty of Paying Living Expenses: Not hard at all  Food Insecurity: No Food Insecurity  . Worried About Charity fundraiser in the Last Year: Never true  . Ran Out of Food in the Last Year: Never true  Transportation Needs: No Transportation Needs  . Lack of Transportation (Medical): No  . Lack of Transportation (Non-Medical): No  Physical Activity: Insufficiently Active  . Days of Exercise per Week: 3 days  . Minutes of Exercise per Session: 20 min  Stress: No Stress Concern Present  . Feeling of Stress : Only a little  Social Connections: Socially Isolated  . Frequency of Communication with Friends and Family: More than three times a week  . Frequency of Social Gatherings with Friends and Family: Never  . Attends Religious Services: Never  . Active Member of Clubs or Organizations: No  . Attends Archivist Meetings: Never  . Marital Status: Widowed    Tobacco Counseling Counseling given: Not Answered   Clinical Intake:  Pre-visit preparation completed: Yes  Pain : No/denies pain     BMI - recorded: 26.22 Nutritional Status: BMI 25 -29 Overweight Diabetes: No  How often  do you need to have someone help you when you read instructions, pamphlets, or other written materials from your doctor or pharmacy?: 1 - Never  Diabetic?No  Interpreter Needed?: No  Information entered by :: Charlott Rakes, LPN   Activities of Daily Living In your present state of health, do you have any difficulty performing the following activities: 11/10/2019  Hearing? Y  Comment mild loss due to wax  Vision? N  Difficulty concentrating or making decisions? N  Dressing or bathing? N  Doing errands, shopping? N  Preparing Food and eating ? N  Using the Toilet? N  In the past six months, have you accidently leaked urine? N  Do you have problems with loss of bowel control?  N  Managing your Medications? N  Managing your Finances? N  Housekeeping or managing your Housekeeping? N  Some recent data might be hidden    Patient Care Team: [place], Betty G, MD as PCP - General (Family Medicine) Minus Breeding, MD as PCP - Cardiology (Cardiology)  Indicate any recent Medical Services you may have received from other than [facility] providers in the past year (date may be approximate).     Assessment:   This is a routine wellness examination for [place].  Hearing/Vision screen  Hearing Screening   125Hz  250Hz  500Hz  1000Hz  2000Hz  3000Hz  4000Hz  6000Hz  8000Hz   Right ear:           Left ear:           Comments: Pt states wax in ears at times mild loss from that at times  Vision Screening Comments: Follows up with Dr Gershon Crane annually for eye exams  Dietary issues and exercise activities discussed: Current Exercise Habits: Home exercise routine (starting to execise on staionary bike), Type of exercise: walking;Other - see comments (stationary bike), Time (Minutes): 20, Frequency (Times/Week): 3, Weekly Exercise (Minutes/Week): 60  Goals    . Exercise 150 min/wk Moderate Activity     Will start back walking At least 50 three days a week     . Exercise 150 minutes per week (moderate  activity)     Will start walking Find your schedule; get active  Be social some of the time      . Patient Stated     Start exercising again and lose weight      Depression Screen PHQ 2/9 Scores 11/10/2019 11/10/2018 12/03/2017 10/28/2017 06/16/2017 10/24/2016  PHQ - 2 Score 1 0 0 0 0 0  PHQ- 9 Score - - - - 0 -    Fall Risk Fall Risk  11/10/2019 11/09/2019 11/10/2018 12/03/2017 10/28/2017  Falls in the past year? 0 0 0 No No  Number falls in past yr: 0 0 0 - -  Injury with Fall? 0 0 0 - -  Risk for fall due to : Impaired vision - - - -  Follow up Falls prevention discussed Education provided Education provided - -    Any stairs in or around the home? Yes  If so, are there any without handrails? No  Home free of loose throw rugs in walkways, pet beds, electrical cords, etc? Yes  Adequate lighting in your home to reduce risk of falls? Yes   ASSISTIVE DEVICES UTILIZED TO PREVENT FALLS:  Life alert? No  Use of a cane, walker or w/c? No  Grab bars in the bathroom? No  Shower chair or bench in shower? Yes  Elevated toilet seat or a handicapped toilet? Yes   TIMED UP AND GO:  Was the test performed? No .      Cognitive Function: MMSE - Mini Mental State Exam 10/28/2017  Not completed: (No Data)     6CIT Screen 11/10/2019  What Year? 0 points  What month? 0 points  Count back from 20 0 points  Months in reverse 0 points  Repeat phrase 0 points    Immunizations Immunization History  Administered Date(s) Administered  . Fluad Quad(high Dose 65+) 11/09/2018, 11/09/2019  . Influenza, High Dose Seasonal PF 11/06/2015, 10/24/2016, 12/03/2017  . PFIZER SARS-COV-2 Vaccination 02/26/2019, 03/19/2019  . Pneumococcal Conjugate-13 09/04/2013  . Pneumococcal Polysaccharide-23 08/04/2012, 05/10/2019  . Zoster Recombinat (Shingrix) 04/04/2017, 06/06/2017, 07/05/2017, 08/13/2017    TDAP status: Due, Education has been provided  regarding the importance of this vaccine. Advised may  receive this vaccine at local pharmacy or Health Dept. Aware to provide a copy of the vaccination record if obtained from local pharmacy or Health Dept. Verbalized acceptance and understanding. Flu Vaccine status: Up to date Pneumococcal vaccine status: Up to date Covid-19 vaccine status: Completed vaccines  Qualifies for Shingles Vaccine? Yes   Zostavax completed Yes   Shingrix Completed?: Yes  Screening Tests Health Maintenance  Topic Date Due  . MAMMOGRAM  10/03/2021  . COLONOSCOPY  07/28/2022  . INFLUENZA VACCINE  Completed  . DEXA SCAN  Completed  . COVID-19 Vaccine  Completed  . Hepatitis C Screening  Completed  . PNA vac Low Risk Adult  Completed  . TETANUS/TDAP  Discontinued    Health Maintenance  There are no preventive care reminders to display for this patient.  Colorectal cancer screening: Completed 07/28/19. Repeat every 3 years Mammogram status: Completed 10/04/19. Repeat every year Bone Density status: Completed 10/29/16. Results reflect: Bone density results: OSTEOPENIA. Repeat every 2 years.    Additional Screening:  Hepatitis C Screening:  Completed 12/08/17  Vision Screening: Recommended annual ophthalmology exams for early detection of glaucoma and other disorders of the eye. Is the patient up to date with their annual eye exam?  Yes  Who is the provider or what is the name of the office in which the patient attends annual eye exams? Dr Gershon Crane   Dental Screening: Recommended annual dental exams for proper oral hygiene  Community Resource Referral / Chronic Care Management: CRR required this visit?  No   CCM required this visit?  No      Plan:     I have personally reviewed and noted the following in the patient's chart:   . Medical and social history . Use of alcohol, tobacco or illicit drugs  . Current medications and supplements . Functional ability and status . Nutritional status . Physical activity . Advanced directives . List of  other physicians . Hospitalizations, surgeries, and ER visits in previous 12 months . Vitals . Screenings to include cognitive, depression, and falls . Referrals and appointments  In addition, I have reviewed and discussed with patient certain preventive protocols, quality metrics, and best practice recommendations. A written personalized care plan for preventive services as well as general preventive health recommendations were provided to patient.     Willette Brace, LPN   [id number]/10/[id number]   Nurse Notes: None

## 2019-11-10 NOTE — Patient Instructions (Addendum)
Nancy Herman , Thank you for taking time to come for your Medicare Wellness Visit. I appreciate your ongoing commitment to your health goals. Please review the following plan we discussed and let me know if I can assist you in the future.   Screening recommendations/referrals: Colonoscopy: Done 07/28/19 Mammogram: Done 10/04/19 Bone Density: Done 10/29/16 Recommended yearly ophthalmology/optometry visit for glaucoma screening and checkup Recommended yearly dental visit for hygiene and checkup  Vaccinations: Influenza vaccine: Done 11/09/19 Pneumococcal vaccine: Up to date Tdap vaccine: Due and discussed Shingles vaccine: Completed 07/05/17 & 08/13/17   Covid-19:Completed 1/22 & 03/19/19  Advanced directives: Please bring a copy of your health care power of attorney and living will to the office at your convenience.  Conditions/risks identified: Start exercising and lose weight  Next appointment: Follow up in one year for your annual wellness visit    Preventive Care 65 Years and Older, Female Preventive care refers to lifestyle choices and visits with your health care provider that can promote health and wellness. What does preventive care include?  A yearly physical exam. This is also called an annual well check.  Dental exams once or twice a year.  Routine eye exams. Ask your health care provider how often you should have your eyes checked.  Personal lifestyle choices, including:  Daily care of your teeth and gums.  Regular physical activity.  Eating a healthy diet.  Avoiding tobacco and drug use.  Limiting alcohol use.  Practicing safe sex.  Taking low-dose aspirin every day.  Taking vitamin and mineral supplements as recommended by your health care provider. What happens during an annual well check? The services and screenings done by your health care provider during your annual well check will depend on your age, overall health, lifestyle risk factors, and family  history of disease. Counseling  Your health care provider may ask you questions about your:  Alcohol use.  Tobacco use.  Drug use.  Emotional well-being.  Home and relationship well-being.  Sexual activity.  Eating habits.  History of falls.  Memory and ability to understand (cognition).  Work and work Statistician.  Reproductive health. Screening  You may have the following tests or measurements:  Height, weight, and BMI.  Blood pressure.  Lipid and cholesterol levels. These may be checked every 5 years, or more frequently if you are over 74 years old.  Skin check.  Lung cancer screening. You may have this screening every year starting at age 11 if you have a 30-pack-year history of smoking and currently smoke or have quit within the past 15 years.  Fecal occult blood test (FOBT) of the stool. You may have this test every year starting at age 19.  Flexible sigmoidoscopy or colonoscopy. You may have a sigmoidoscopy every 5 years or a colonoscopy every 10 years starting at age 53.  Hepatitis C blood test.  Hepatitis B blood test.  Sexually transmitted disease (STD) testing.  Diabetes screening. This is done by checking your blood sugar (glucose) after you have not eaten for a while (fasting). You may have this done every 1-3 years.  Bone density scan. This is done to screen for osteoporosis. You may have this done starting at age 87.  Mammogram. This may be done every 1-2 years. Talk to your health care provider about how often you should have regular mammograms. Talk with your health care provider about your test results, treatment options, and if necessary, the need for more tests. Vaccines  Your health care provider may  recommend certain vaccines, such as:  Influenza vaccine. This is recommended every year.  Tetanus, diphtheria, and acellular pertussis (Tdap, Td) vaccine. You may need a Td booster every 10 years.  Zoster vaccine. You may need this after  age 65.  Pneumococcal 13-valent conjugate (PCV13) vaccine. One dose is recommended after age 11.  Pneumococcal polysaccharide (PPSV23) vaccine. One dose is recommended after age 109. Talk to your health care provider about which screenings and vaccines you need and how often you need them. This information is not intended to replace advice given to you by your health care provider. Make sure you discuss any questions you have with your health care provider. Document Released: 02/17/2015 Document Revised: 10/11/2015 Document Reviewed: 11/22/2014 Elsevier Interactive Patient Education  2017 [place] Prevention in the Home Falls can cause injuries. They can happen to people of all ages. There are many things you can do to make your home safe and to help prevent falls. What can I do on the outside of my home?  Regularly fix the edges of walkways and driveways and fix any cracks.  Remove anything that might make you trip as you walk through a door, such as a raised step or threshold.  Trim any bushes or trees on the path to your home.  Use bright outdoor lighting.  Clear any walking paths of anything that might make someone trip, such as rocks or tools.  Regularly check to see if handrails are loose or broken. Make sure that both sides of any steps have handrails.  Any raised decks and porches should have guardrails on the edges.  Have any leaves, snow, or ice cleared regularly.  Use sand or salt on walking paths during winter.  Clean up any spills in your garage right away. This includes oil or grease spills. What can I do in the bathroom?  Use night lights.  Install grab bars by the toilet and in the tub and shower. Do not use towel bars as grab bars.  Use non-skid mats or decals in the tub or shower.  If you need to sit down in the shower, use a plastic, non-slip stool.  Keep the floor dry. Clean up any water that spills on the floor as soon as it  happens.  Remove soap buildup in the tub or shower regularly.  Attach bath mats securely with double-sided non-slip rug tape.  Do not have throw rugs and other things on the floor that can make you trip. What can I do in the bedroom?  Use night lights.  Make sure that you have a light by your bed that is easy to reach.  Do not use any sheets or blankets that are too big for your bed. They should not hang down onto the floor.  Have a firm chair that has side arms. You can use this for support while you get dressed.  Do not have throw rugs and other things on the floor that can make you trip. What can I do in the kitchen?  Clean up any spills right away.  Avoid walking on wet floors.  Keep items that you use a lot in easy-to-reach places.  If you need to reach something above you, use a strong step stool that has a grab bar.  Keep electrical cords out of the way.  Do not use floor polish or wax that makes floors slippery. If you must use wax, use non-skid floor wax.  Do not have throw rugs and  other things on the floor that can make you trip. What can I do with my stairs?  Do not leave any items on the stairs.  Make sure that there are handrails on both sides of the stairs and use them. Fix handrails that are broken or loose. Make sure that handrails are as long as the stairways.  Check any carpeting to make sure that it is firmly attached to the stairs. Fix any carpet that is loose or worn.  Avoid having throw rugs at the top or bottom of the stairs. If you do have throw rugs, attach them to the floor with carpet tape.  Make sure that you have a light switch at the top of the stairs and the bottom of the stairs. If you do not have them, ask someone to add them for you. What else can I do to help prevent falls?  Wear shoes that:  Do not have high heels.  Have rubber bottoms.  Are comfortable and fit you well.  Are closed at the toe. Do not wear sandals.  If you  use a stepladder:  Make sure that it is fully opened. Do not climb a closed stepladder.  Make sure that both sides of the stepladder are locked into place.  Ask someone to hold it for you, if possible.  Clearly mark and make sure that you can see:  Any grab bars or handrails.  First and last steps.  Where the edge of each step is.  Use tools that help you move around (mobility aids) if they are needed. These include:  Canes.  Walkers.  Scooters.  Crutches.  Turn on the lights when you go into a dark area. Replace any light bulbs as soon as they burn out.  Set up your furniture so you have a clear path. Avoid moving your furniture around.  If any of your floors are uneven, fix them.  If there are any pets around you, be aware of where they are.  Review your medicines with your doctor. Some medicines can make you feel dizzy. This can increase your chance of falling. Ask your doctor what other things that you can do to help prevent falls. This information is not intended to replace advice given to you by your health care provider. Make sure you discuss any questions you have with your health care provider. Document Released: 11/17/2008 Document Revised: 06/29/2015 Document Reviewed: 02/25/2014 Elsevier Interactive Patient Education  2017 [employer].

## 2019-11-12 ENCOUNTER — Ambulatory Visit (INDEPENDENT_AMBULATORY_CARE_PROVIDER_SITE_OTHER): Payer: Medicare HMO

## 2019-11-12 ENCOUNTER — Other Ambulatory Visit: Payer: Self-pay

## 2019-11-12 ENCOUNTER — Ambulatory Visit (INDEPENDENT_AMBULATORY_CARE_PROVIDER_SITE_OTHER): Payer: Medicare HMO | Admitting: Family Medicine

## 2019-11-12 ENCOUNTER — Encounter: Payer: Self-pay | Admitting: Family Medicine

## 2019-11-12 VITALS — BP 110/70 | HR 66 | Temp 98.1°F | Ht 64.5 in | Wt 147.4 lb

## 2019-11-12 DIAGNOSIS — R5383 Other fatigue: Secondary | ICD-10-CM

## 2019-11-12 DIAGNOSIS — E785 Hyperlipidemia, unspecified: Secondary | ICD-10-CM | POA: Diagnosis not present

## 2019-11-12 DIAGNOSIS — R0602 Shortness of breath: Secondary | ICD-10-CM | POA: Diagnosis not present

## 2019-11-12 DIAGNOSIS — R079 Chest pain, unspecified: Secondary | ICD-10-CM

## 2019-11-12 DIAGNOSIS — M25511 Pain in right shoulder: Secondary | ICD-10-CM | POA: Diagnosis not present

## 2019-11-12 DIAGNOSIS — E538 Deficiency of other specified B group vitamins: Secondary | ICD-10-CM

## 2019-11-12 DIAGNOSIS — K219 Gastro-esophageal reflux disease without esophagitis: Secondary | ICD-10-CM

## 2019-11-12 DIAGNOSIS — R0789 Other chest pain: Secondary | ICD-10-CM

## 2019-11-12 DIAGNOSIS — E559 Vitamin D deficiency, unspecified: Secondary | ICD-10-CM

## 2019-11-12 NOTE — Progress Notes (Signed)
Nancy Herman DOB: October 22, 1945 Encounter date: 11/12/2019  This is a 74 y.o. female who presents with Chief Complaint  Patient presents with  . Abnormal ECG    History of present illness: She was having pain in right arm, down go back and epigastric and so called EMS. Feeling tightness in right arm, back, but less in back. Just started today. EMS was called this morning. They gave her option of going to the hospital but told her she should follow up with bloodwork.   Does feel like breathing is a little more work than normal. Woke up with pain this way.   No headaches, no dizziness. Felt maybe a little light headed. Was trying to walk around a bit with pain. No nausea. Hasn't eaten anything yet today. She did get flu shot in right arm on Tuesday, but otherwise no other trigger for pain.   Does have acid reflux. Once pain improved a little this morning started burping. Hasn't felt like this was well controlled but was started on pepcid on Tuesday on top of the protonix.   When she called EMS pain was at 8-9/10; right now states she feels more nagging - like she lifted something too heavy and has some left over pain. Stomach feels better now. Had some greens yesterday, but nothing she would think to trigger acid last night. Last BM was yesterday. No blood in stool; no black stools.   Had the same problem earlier this week - very tight; down arm and back and same abd discomfort. Thought maybe reflux and maybe anxiety. Has had a similar arm and back pain when she has been anxious in the past, just not as intense as this morning.   Hasn't been sleeping well, energy level just hasn't been good.   Has felt a little hotter with exercise but not having breathing issues or shoulder discomfort with this. No trouble with breathing when walking into office today. Doesn't feel short of breath. No wheezing, no cough. No fevers, chills.    Allergies  Allergen Reactions  . Other Other (See  Comments)    Seasonal allergies   No outpatient medications have been marked as taking for the 11/12/19 encounter (Office Visit) with Caren Macadam, MD.    Review of Systems  Respiratory: Negative for chest tightness, shortness of breath and wheezing.   Cardiovascular: Negative for chest pain, palpitations and leg swelling.  Gastrointestinal: Negative for abdominal pain, blood in stool, constipation and nausea.  Musculoskeletal: Arthralgias: aching in shoulder, right arm.  Skin: Negative for color change and rash.  Psychiatric/Behavioral: The patient is nervous/anxious.     Objective:  BP 110/70 (BP Location: Left Arm, Patient Position: Sitting, Cuff Size: Normal)   Pulse 66   Temp 98.1 F (36.7 C)   Ht 5' 4.5" (1.638 m)   Wt 147 lb 6.4 oz (66.9 kg)   LMP  (LMP Unknown)   SpO2 97%   BMI 24.91 kg/m   Weight: 147 lb 6.4 oz (66.9 kg)   BP Readings from Last 3 Encounters:  11/12/19 110/70  11/09/19 120/76  07/28/19 133/72   Wt Readings from Last 3 Encounters:  11/12/19 147 lb 6.4 oz (66.9 kg)  11/09/19 148 lb (67.1 kg)  07/28/19 156 lb (70.8 kg)    Physical Exam Constitutional:      General: She is not in acute distress.    Appearance: She is well-developed.  Cardiovascular:     Rate and Rhythm: Normal rate and regular rhythm.  Heart sounds: Normal heart sounds. No murmur heard.  No friction rub.  Pulmonary:     Effort: Pulmonary effort is normal. No respiratory distress.     Breath sounds: Normal breath sounds. No wheezing or rales.  Chest:     Comments: Slight tenderness to palpation under right rib cage, but overall pain has subsided and unable to reproduce.  Abdominal:     General: Abdomen is flat. Bowel sounds are normal. There is no distension.     Palpations: Abdomen is soft.     Tenderness: There is no abdominal tenderness.  Musculoskeletal:     Right lower leg: No edema.     Left lower leg: No edema.  Neurological:     Mental Status: She is  alert and oriented to person, place, and time.  Psychiatric:        Behavior: Behavior normal.     Assessment/Plan  1. Chest pain, unspecified type Although EKG reads as type II block; it does not appear consistent with this on evaluation. rythym is sinus with one premature, conducted beat. No acute changes. When compared with prior EKG it is stable.  - EKG 12-Lead - Magnesium, RBC; Future - D-dimer, quantitative (not at [facility]); Future - Troponin I; Future - Troponin I - D-dimer, quantitative (not at [facility]) - Magnesium, RBC  2. Acute pain of right shoulder Uncertain etiology of this. She did have flu shot in right arm earlier this week, but severity of pain indicates alternative source.  3. Hyperlipidemia, unspecified hyperlipidemia type - Comprehensive metabolic panel; Future - Lipid panel; Future - Lipid panel - Comprehensive metabolic panel  4. Shortness of breath We are going to check other bloodwork to try and rule out CV cause (although EKG is reassuring) and PE. She is really not short of breath; just feels she is thinking through breathing more.  - DG Chest 2 View; Future  5. Gastroesophageal reflux disease, unspecified whether esophagitis present Has recently had pepcid added to protonix. She is getting some relief with this; continue medications.   6. Vitamin D deficiency - VITAMIN D 25 Hydroxy (Vit-D Deficiency, Fractures); Future - VITAMIN D 25 Hydroxy (Vit-D Deficiency, Fractures)  7. B12 deficiency - Vitamin B12; Future - Vitamin B12  8. Fatigue, unspecified type She does complain of fatigue slightly worse than normal that has been progressing for some time. Will check some basic labs and suggest follow up with PCP - CBC with Differential/Platelet; Future - TSH; Future - TSH - CBC with Differential/Platelet  Patient was advised to go to ER if chest discomfort worsens or she has additional symptoms. Advised that she can always call after hours as well for  concerns if seeming non urgent.      Micheline Rough, MD

## 2019-11-13 ENCOUNTER — Encounter: Payer: Self-pay | Admitting: Family Medicine

## 2019-11-15 ENCOUNTER — Other Ambulatory Visit: Payer: Self-pay

## 2019-11-15 ENCOUNTER — Telehealth: Payer: Self-pay | Admitting: Family Medicine

## 2019-11-15 ENCOUNTER — Ambulatory Visit
Admission: RE | Admit: 2019-11-15 | Discharge: 2019-11-15 | Disposition: A | Discharge status: Home / Self Care | Payer: Medicare HMO | Source: Ambulatory Visit | Attending: Family Medicine | Admitting: Family Medicine

## 2019-11-15 DIAGNOSIS — Z780 Asymptomatic menopausal state: Secondary | ICD-10-CM

## 2019-11-15 NOTE — Addendum Note (Signed)
Addended by: Agnes Lawrence on: 11/15/2019 10:52 AM   Modules accepted: Orders

## 2019-11-15 NOTE — Telephone Encounter (Signed)
I spoke with Nancy Herman. They have taken care of this, ordered a STAT CT for pt.

## 2019-11-15 NOTE — Telephone Encounter (Signed)
Pt got a call from Leb Phill Myron wanting to know how she was doing. She was fine then, now she is feeling pain under her rib cage and goes underneath R breast, no trouble breathing. Please advise at [phone].

## 2019-11-16 LAB — COMPREHENSIVE METABOLIC PANEL
AG Ratio: 1.4 (calc) (ref 1.0–2.5)
ALT: 12 U/L (ref 6–29)
AST: 17 U/L (ref 10–35)
Albumin: 4.2 g/dL (ref 3.6–5.1)
Alkaline phosphatase (APISO): 58 U/L (ref 37–153)
BUN/Creatinine Ratio: 8 (calc) (ref 6–22)
BUN: 9 mg/dL (ref 7–25)
CO2: 29 mmol/L (ref 20–32)
Calcium: 10.1 mg/dL (ref 8.6–10.4)
Chloride: 104 mmol/L (ref 98–110)
Creat: 1.19 mg/dL — ABNORMAL HIGH (ref 0.60–0.93)
Globulin: 3.1 g/dL (calc) (ref 1.9–3.7)
Glucose, Bld: 92 mg/dL (ref 65–99)
Potassium: 4.2 mmol/L (ref 3.5–5.3)
Sodium: 142 mmol/L (ref 135–146)
Total Bilirubin: 0.5 mg/dL (ref 0.2–1.2)
Total Protein: 7.3 g/dL (ref 6.1–8.1)

## 2019-11-16 LAB — LIPID PANEL
Cholesterol: 222 mg/dL — ABNORMAL HIGH (ref <200)
HDL: 95 mg/dL (ref >=50)
LDL Cholesterol (Calc): 112 mg/dL (calc) — ABNORMAL HIGH
Non-HDL Cholesterol (Calc): 127 mg/dL (calc) (ref <130)
Total CHOL/HDL Ratio: 2.3 (calc) (ref <5.0)
Triglycerides: 64 mg/dL (ref <150)

## 2019-11-16 LAB — CBC WITH DIFFERENTIAL/PLATELET
Absolute Monocytes: 510 cells/uL (ref 200–950)
Basophils Absolute: 39 cells/uL (ref 0–200)
Basophils Relative: 0.7 %
Eosinophils Absolute: 39 cells/uL (ref 15–500)
Eosinophils Relative: 0.7 %
HCT: 36.3 % (ref 35.0–45.0)
Hemoglobin: 11.9 g/dL (ref 11.7–15.5)
Lymphs Abs: 1562 cells/uL (ref 850–3900)
MCH: 28.7 pg (ref 27.0–33.0)
MCHC: 32.8 g/dL (ref 32.0–36.0)
MCV: 87.7 fL (ref 80.0–100.0)
MPV: 11.9 fL (ref 7.5–12.5)
Monocytes Relative: 9.1 %
Neutro Abs: 3450 cells/uL (ref 1500–7800)
Neutrophils Relative %: 61.6 %
Platelets: 227 10*3/uL (ref 140–400)
RBC: 4.14 10*6/uL (ref 3.80–5.10)
RDW: 13.5 % (ref 11.0–15.0)
Total Lymphocyte: 27.9 %
WBC: 5.6 10*3/uL (ref 3.8–10.8)

## 2019-11-16 LAB — MAGNESIUM, RBC: Magnesium RBC: 5.4 mg/dL (ref 4.0–6.4)

## 2019-11-16 LAB — VITAMIN B12: Vitamin B-12: 522 pg/mL (ref 200–1100)

## 2019-11-16 LAB — D-DIMER, QUANTITATIVE: D-Dimer, Quant: 0.83 mcg/mL FEU — ABNORMAL HIGH (ref <0.50)

## 2019-11-16 LAB — TSH: TSH: 2.75 mIU/L (ref 0.40–4.50)

## 2019-11-16 LAB — TROPONIN I: Troponin I: <3 ng/L (ref <=47)

## 2019-11-16 LAB — VITAMIN D 25 HYDROXY (VIT D DEFICIENCY, FRACTURES): Vit D, 25-Hydroxy: 36 ng/mL (ref 30–100)

## 2019-11-17 ENCOUNTER — Other Ambulatory Visit: Payer: Self-pay

## 2019-11-17 ENCOUNTER — Ambulatory Visit (INDEPENDENT_AMBULATORY_CARE_PROVIDER_SITE_OTHER): Payer: Medicare HMO | Admitting: Psychiatry

## 2019-11-17 DIAGNOSIS — F411 Generalized anxiety disorder: Secondary | ICD-10-CM

## 2019-11-17 DIAGNOSIS — F329 Major depressive disorder, single episode, unspecified: Secondary | ICD-10-CM | POA: Diagnosis not present

## 2019-11-17 DIAGNOSIS — F32A Depression, unspecified: Secondary | ICD-10-CM

## 2019-11-17 DIAGNOSIS — F333 Major depressive disorder, recurrent, severe with psychotic symptoms: Secondary | ICD-10-CM | POA: Diagnosis not present

## 2019-11-17 MED ORDER — FLUOXETINE HCL 20 MG PO CAPS: 20.0000 mg | ORAL_CAPSULE | Freq: Every day | ORAL | 5 refills | Status: DC

## 2019-11-17 MED ORDER — RISPERIDONE 0.25 MG PO TABS
ORAL_TABLET | ORAL | 4 refills | Status: DC
Start: 2019-11-17 — End: 2019-12-10

## 2019-11-17 MED ORDER — HYDROXYZINE PAMOATE 25 MG PO CAPS: ORAL_CAPSULE | ORAL | 4 refills | Status: DC

## 2019-11-17 NOTE — Progress Notes (Signed)
Psychiatric Initial Adult Assessment   Patient Identification: Nancy Herman MRN:  [id number] Date of Evaluation:  11/17/2019 Referral Source: Dr. Betty Martinique Chief Complaint:   Visit Diagnosis:    ICD-10-CM   1. Depressive disorder  F32.9 FLUoxetine (PROZAC) 20 MG capsule    DISCONTINUED: FLUoxetine (PROZAC) 20 MG capsule  2. Generalized anxiety disorder  F41.1 FLUoxetine (PROZAC) 20 MG capsule    DISCONTINUED: FLUoxetine (PROZAC) 20 MG capsule    History of Present Illness:  Today on 10/13 2021 the patient is seen with her son Nancy Herman.  The patient is returned early.  The patient was stopped Risperdal a few months ago as planned.  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  She is specific to hears people talking to her in the attic.  She says they are planning to take her home.  The people she knew many years ago..  Patient is afraid the people do not believe her.  She clearly is very uncomfortable but she is also willing to get back to her Risperdal.  I shared with her that the medicine will help her feel less anxious and feel more needed.  Without the record telling her that she is having auditory hallucinations and paranoia the patient is still willing to take it.  She will take Risperdal 0.25 mg twice daily as she was on previously.  She is also having trouble sleeping.  I shared with her that if she is having problems sleeping after week she can start taking Vistaril 25 mg which is called in for her.  The patient also describes acute episodes of anxiety.  She understood that she take the Vistaril for that as well.  She will continue taking Prozac as prescribed.  Patient is well-dressed.  She has good eye contact.  She clearly is very distressed by these intrusive voices.  She clearly feels threatened and frightened.  She is not suicidal.  She is nontender anything dangerous or risky.   Depression Symptoms:  difficulty concentrating, (Hypo) Manic Symptoms:   Anxiety  Symptoms:   Psychotic Symptoms:   PTSD Symptoms:   Past Psychiatric History: Prozac for many years  Previous Psychotropic Medications:   Substance Abuse History in the last 12 months:  No.  Consequences of Substance Abuse:   Past Medical History:  Past Medical History:  Diagnosis Date  . Anxiety   . Frequent headaches   . GERD (gastroesophageal reflux disease)   . Hyperlipidemia   . IBS (irritable bowel syndrome)   . 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 Thyroid disease     Past Surgical History:  Procedure Laterality Date  . ABDOMINAL HYSTERECTOMY  1990  . ANAL RECTAL MANOMETRY N/A 08/14/2018   Procedure: ANO RECTAL MANOMETRY;  Surgeon: Mauri Pole, MD;  Location: [facility];  Service: Endoscopy;  Laterality: N/A;  . BRAIN SURGERY    . BREAST EXCISIONAL BIOPSY Right unsure  . BREAST EXCISIONAL BIOPSY Left unsure  . OVARIAN CYST SURGERY  1994  . REPAIR RECTOCELE  2016   and prolapsed uterus.  . TONSILLECTOMY  1973    Family Psychiatric History:   Family History:  Family History  Problem Relation Age of Onset  . Heart disease Mother        No details   . Alcohol abuse Mother   . Drug abuse Mother   . Sickle cell anemia Mother   . Heart disease Father        No details . Questionable bypass  or stent  . Drug abuse Father   . Alcohol abuse Father     Social History:   Social History   Socioeconomic History  . Marital status: Widowed    Spouse name: Not on file  . Number of children: 2  . Years of education: Not on file  . Highest education level: Not on file  Occupational History  . Occupation: retired  Tobacco Use  . Smoking status: Never Smoker  . Smokeless tobacco: Never Used  Vaping Use  . Vaping Use: Never used  Substance and Sexual Activity  . Alcohol use: No  . Drug use: No  . Sexual activity: Never  Other Topics Concern  . Not on file  Social History Narrative   Lives alone.  Lives near son.  Was in [place].   Social Determinants of  Health   Financial Resource Strain: Low Risk   . Difficulty of Paying Living Expenses: Not hard at all  Food Insecurity: No Food Insecurity  . Worried About Charity fundraiser in the Last Year: Never true  . Ran Out of Food in the Last Year: Never true  Transportation Needs: No Transportation Needs  . Lack of Transportation (Medical): No  . Lack of Transportation (Non-Medical): No  Physical Activity: Insufficiently Active  . Days of Exercise per Week: 3 days  . Minutes of Exercise per Session: 20 min  Stress: No Stress Concern Present  . Feeling of Stress : Only a little  Social Connections: Socially Isolated  . Frequency of Communication with Friends and Family: More than three times a week  . Frequency of Social Gatherings with Friends and Family: Never  . Attends Religious Services: Never  . Active Member of Clubs or Organizations: No  . Attends Archivist Meetings: Never  . Marital Status: Widowed    Additional Social History:   Allergies:   Allergies  Allergen Reactions  . Other Other (See Comments)    Seasonal allergies    Metabolic Disorder Labs: Lab Results  Component Value Date   HGBA1C 5.9 11/09/2018   Lab Results  Component Value Date   PROLACTIN 14.6 03/05/2019   PROLACTIN 5.0 03/04/2018   Lab Results  Component Value Date   CHOL 222 (H) 11/12/2019   TRIG 64 11/12/2019   HDL 95 11/12/2019   CHOLHDL 2.3 11/12/2019   VLDL 17.0 11/09/2018   LDLCALC 112 (H) 11/12/2019   LDLCALC 116 (H) 11/09/2018   Lab Results  Component Value Date   TSH 2.75 11/12/2019    Therapeutic Level Labs: No results found for: LITHIUM No results found for: CBMZ No results found for: VALPROATE  Current Medications: Current Outpatient Medications  Medication Sig Dispense Refill  . Calcium Carbonate-Vit D-Min (CALCIUM 1200 PO) Take 1 tablet by mouth daily after breakfast.     . carbamide peroxide (DEBROX) 6.5 % OTIC solution Place 5 drops into the left ear 2  (two) times daily. (Patient not taking: Reported on 11/10/2019) 15 mL 0  . famotidine (PEPCID) 20 MG tablet Take 1 tablet (20 mg total) by mouth 2 (two) times daily. 90 tablet 1  . FLUoxetine (PROZAC) 20 MG capsule Take 1 capsule (20 mg total) by mouth daily. 30 capsule 5  . hydrocortisone (ANUSOL-HC) 25 MG suppository Place 1 suppository (25 mg total) rectally at bedtime. (Patient not taking: Reported on 11/10/2019) 12 suppository 3  . hydrOXYzine (VISTARIL) 25 MG capsule 1 qhs 30 capsule 4  . montelukast (SINGULAIR) 10 MG tablet TAKE  1 TABLET BY MOUTH AT BEDTIME 90 tablet 2  . pantoprazole (PROTONIX) 40 MG tablet Take 1 tablet by mouth once daily 90 tablet 1  . polyethylene glycol (MIRALAX) packet Take 17 g by mouth daily. 14 each 0  . pravastatin (PRAVACHOL) 20 MG tablet Take 1 tablet by mouth once daily 90 tablet 2  . risperiDONE (RISPERDAL) 0.25 MG tablet 1  bid 60 tablet 4  . traZODone (DESYREL) 50 MG tablet Take 1 tablet (50 mg total) by mouth at bedtime. 30 tablet 5   No current facility-administered medications for this visit.    Musculoskeletal: Strength & Muscle Tone: within normal limits Gait & Station: normal Patient leans:   Psychiatric Specialty Exam: ROS  There were no vitals taken for this visit.There is no height or weight on file to calculate BMI.  General Appearance: Casual  Eye Contact:  Good  Speech: Good  Volume:  Normal  Mood:  NA  Affect:  NA and Appropriate  Thought Process:  Coherent  Orientation:  Full (Time, Place, and Person)  Thought Content:  Logical  Suicidal Thoughts:  No  Homicidal Thoughts:  No  Memory:  Negative  Judgement:  Good  Insight:  Fair  Psychomotor Activity:  Normal  Concentration:    Recall:  [place] of Knowledge:Good  Language: Good  Akathisia:  No  Handed:    AIMS (if indicated):  not done  Assets:  Desire for Improvement  ADL's:  Intact  Cognition: WNL  Sleep:  Good   Screenings: PHQ2-9     Clinical Support from  11/10/2019 in [place] at [facility] from 11/09/2018 in [place] at [facility] from 12/03/2017 in [place] at [facility] from 10/28/2017 in [place] at [facility] from 06/16/2017 in [place] at [facility] Total Score 1 0 0 0 0  PHQ-9 Total Score -- -- -- -- 0      Assessment and Plan:  10/13/20213:48 PM   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  It is hard to really clearly interpret a depressed mood state and she clearly is anxious and distressed.  Also continue taking Prozac as requested she discontinued her Risperdal.  Since off Risperdal she now has recurrence of her psychosis.  We will go ahead back to Risperdal 0.25 mg twice daily.  She will continue taking Prozac as prescribed.  Her second problem is insomnia.  The patient be prescribed Vistaril to be taken for this.  This patient be seen again in 2 to 3 weeks.  I have asked her to bring her son with her as she did this time.  His name is Nancy Herman.

## 2019-11-19 ENCOUNTER — Encounter (HOSPITAL_COMMUNITY): Payer: Self-pay

## 2019-11-19 ENCOUNTER — Emergency Department (HOSPITAL_COMMUNITY)
Admission: EM | Admit: 2019-11-19 | Discharge: 2019-11-19 | Disposition: A | Discharge status: Home / Self Care | Payer: Medicare HMO | Attending: Emergency Medicine | Admitting: Emergency Medicine

## 2019-11-19 ENCOUNTER — Other Ambulatory Visit: Payer: Self-pay

## 2019-11-19 ENCOUNTER — Ambulatory Visit
Admission: RE | Admit: 2019-11-19 | Discharge: 2019-11-19 | Disposition: A | Discharge status: Home / Self Care | Payer: Medicare HMO | Source: Ambulatory Visit | Attending: Family Medicine | Admitting: Family Medicine

## 2019-11-19 ENCOUNTER — Emergency Department (HOSPITAL_COMMUNITY): Payer: Medicare HMO

## 2019-11-19 DIAGNOSIS — Z20822 Contact with and (suspected) exposure to covid-19: Secondary | ICD-10-CM | POA: Diagnosis not present

## 2019-11-19 DIAGNOSIS — R0789 Other chest pain: Secondary | ICD-10-CM | POA: Insufficient documentation

## 2019-11-19 DIAGNOSIS — R0602 Shortness of breath: Secondary | ICD-10-CM | POA: Diagnosis not present

## 2019-11-19 DIAGNOSIS — M79605 Pain in left leg: Secondary | ICD-10-CM | POA: Insufficient documentation

## 2019-11-19 DIAGNOSIS — Z7189 Other specified counseling: Secondary | ICD-10-CM

## 2019-11-19 LAB — COMPREHENSIVE METABOLIC PANEL
ALT: 14 U/L (ref 0–44)
AST: 18 U/L (ref 15–41)
Albumin: 3.8 g/dL (ref 3.5–5.0)
Alkaline Phosphatase: 51 U/L (ref 38–126)
Anion gap: 9 (ref 5–15)
BUN: 11 mg/dL (ref 8–23)
CO2: 27 mmol/L (ref 22–32)
Calcium: 9.4 mg/dL (ref 8.9–10.3)
Chloride: 106 mmol/L (ref 98–111)
Creatinine, Ser: 1.08 mg/dL — ABNORMAL HIGH (ref 0.44–1.00)
GFR, Estimated: 51 mL/min — ABNORMAL LOW (ref >60)
Glucose, Bld: 97 mg/dL (ref 70–99)
Potassium: 3.5 mmol/L (ref 3.5–5.1)
Sodium: 142 mmol/L (ref 135–145)
Total Bilirubin: 0.5 mg/dL (ref 0.3–1.2)
Total Protein: 7.2 g/dL (ref 6.5–8.1)

## 2019-11-19 LAB — CBC WITH DIFFERENTIAL/PLATELET
Abs Immature Granulocytes: 0.02 10*3/uL (ref 0.00–0.07)
Basophils Absolute: 0 10*3/uL (ref 0.0–0.1)
Basophils Relative: 1 %
Eosinophils Absolute: 0.1 10*3/uL (ref 0.0–0.5)
Eosinophils Relative: 2 %
HCT: 33.3 % — ABNORMAL LOW (ref 36.0–46.0)
Hemoglobin: 11 g/dL — ABNORMAL LOW (ref 12.0–15.0)
Immature Granulocytes: 0 %
Lymphocytes Relative: 25 %
Lymphs Abs: 1.5 10*3/uL (ref 0.7–4.0)
MCH: 28.9 pg (ref 26.0–34.0)
MCHC: 33 g/dL (ref 30.0–36.0)
MCV: 87.4 fL (ref 80.0–100.0)
Monocytes Absolute: 0.7 10*3/uL (ref 0.1–1.0)
Monocytes Relative: 11 %
Neutro Abs: 3.7 10*3/uL (ref 1.7–7.7)
Neutrophils Relative %: 61 %
Platelets: 207 10*3/uL (ref 150–400)
RBC: 3.81 MIL/uL — ABNORMAL LOW (ref 3.87–5.11)
RDW: 14.3 % (ref 11.5–15.5)
WBC: 6 10*3/uL (ref 4.0–10.5)
nRBC: 0 % (ref 0.0–0.2)

## 2019-11-19 LAB — RESPIRATORY PANEL BY RT PCR (FLU A&B, COVID)
Influenza A by PCR: NEGATIVE
Influenza B by PCR: NEGATIVE
SARS Coronavirus 2 by RT PCR: NEGATIVE

## 2019-11-19 LAB — TROPONIN I (HIGH SENSITIVITY): Troponin I (High Sensitivity): 2 ng/L (ref <18)

## 2019-11-19 MED ORDER — ACETAMINOPHEN 325 MG PO TABS
650.0000 mg | ORAL_TABLET | Freq: Once | ORAL | Status: AC
  Administered 2019-11-19: 650 mg via ORAL
  Filled 2019-11-19: qty 2

## 2019-11-19 MED ORDER — IOPAMIDOL (ISOVUE-370) INJECTION 76%
75.0000 mL | Freq: Once | INTRAVENOUS | Status: AC | PRN
  Administered 2019-11-19: 75 mL via INTRAVENOUS

## 2019-11-19 NOTE — ED Provider Notes (Signed)
[facility] DEPT Provider Note   CSN: [id number] Arrival date & time: 11/19/19  1231     History Chief Complaint  Patient presents with  . Chest Pain    Nancy Herman is a 74 y.o. female with a past medical history of anxiety, depression, hyperlipidemia presenting to the ED with a chief complaint of chest pain.  She was driving back from [facility] when she started experiencing left leg pain and right sided chest pain.  Both pain resolved after about 5 minutes.  She reports just having "pressure" on the right side of her chest now.  She did get "really sweaty" during the episode which resolved after time as well.  She has some shortness of breath associated with the episode.  Denies any nausea, vomiting, abdominal pain, leg swelling, history of DVT, PE, MI.  States that similar episode happened last week, she was evaluated by EMS with an unremarkable EKG.  She was told to come to the ER but instead she made appoint with her PCP.  They did lab work and she was told that "everything was normal."  She did go for a CT angio of her chest this morning but has not gotten the results.  She is concerned that this may be due to stress.  HPI     Past Medical History:  Diagnosis Date  . Anxiety   . Frequent headaches   . GERD (gastroesophageal reflux disease)   . Hyperlipidemia   . IBS (irritable bowel syndrome)   . Pituitary insufficiency ([place])   . Thyroid disease     Patient Active Problem List   Diagnosis Date Noted  . History of colonic polyps 06/11/2019  . Dyssynergic defecation   . Precordial chest pain 05/05/2018  . Dyslipidemia 05/05/2018  . 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 H/O urinary frequency 02/09/2018  . Cervicalgia 03/26/2017  . Dysphagia 12/04/2016  . Primary osteoarthritis of both knees 06/03/2016  . Depressive disorder 05/21/2016  . OAG (open angle glaucoma) suspect, low risk,  bilateral 04/18/2016  . Cough 03/28/2016  . 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 GERD (gastroesophageal reflux disease) 11/06/2015  . Irritable bowel syndrome with constipation 10/16/2015  . Hyperlipidemia 10/16/2015  . Insomnia disorder 10/16/2015  . Anxiety disorder 10/16/2015  . Allergic rhinitis 10/16/2015  . Pituitary adenoma ([place]) 09/26/2015    Past Surgical History:  Procedure Laterality Date  . ABDOMINAL HYSTERECTOMY  1990  . ANAL RECTAL MANOMETRY N/A 08/14/2018   Procedure: ANO RECTAL MANOMETRY;  Surgeon: Mauri Pole, MD;  Location: [facility];  Service: Endoscopy;  Laterality: N/A;  . BRAIN SURGERY    . BREAST EXCISIONAL BIOPSY Right unsure  . BREAST EXCISIONAL BIOPSY Left unsure  . OVARIAN CYST SURGERY  1994  . REPAIR RECTOCELE  2016   and prolapsed uterus.  . TONSILLECTOMY  1973     OB History   No obstetric history on file.     Family History  Problem Relation Age of Onset  . Heart disease Mother        No details   . Alcohol abuse Mother   . Drug abuse Mother   . Sickle cell anemia Mother   . Heart disease Father        No details . Questionable bypass or stent  . Drug abuse Father   . Alcohol abuse Father     Social History   Tobacco Use  . Smoking status: Never Smoker  .  Smokeless tobacco: Never Used  Vaping Use  . Vaping Use: Never used  Substance Use Topics  . Alcohol use: No  . Drug use: No    Home Medications Prior to Admission medications   Medication Sig Start Date End Date Taking? Authorizing Provider  cetirizine (ZYRTEC) 10 MG tablet Take 10 mg by mouth daily.   Yes [provider]  famotidine (PEPCID) 20 MG tablet Take 1 tablet (20 mg total) by mouth 2 (two) times daily. 11/09/19  Yes Martinique, Betty G, MD  FLUoxetine (PROZAC) 20 MG capsule Take 1 capsule (20 mg total) by mouth daily. 11/17/19  Yes Plovsky, Berneta Sages, MD  hydrOXYzine (VISTARIL) 25 MG capsule 1 qhs Patient taking differently: Take 25 mg by mouth  at bedtime. 1 qhs 11/17/19  Yes Plovsky, Berneta Sages, MD  montelukast (SINGULAIR) 10 MG tablet TAKE 1 TABLET BY MOUTH AT BEDTIME Patient taking differently: Take 10 mg by mouth at bedtime.  10/12/19  Yes Martinique, Betty G, MD  pantoprazole (PROTONIX) 40 MG tablet Take 1 tablet by mouth once daily Patient taking differently: Take 40 mg by mouth daily.  10/29/19  Yes Martinique, Betty G, MD  polyethylene glycol [facility]) packet Take 17 g by mouth daily. 02/22/18  Yes Petrucelli, Samantha R, PA-C  pravastatin (PRAVACHOL) 20 MG tablet Take 1 tablet by mouth once daily Patient taking differently: Take 20 mg by mouth daily.  03/15/19  Yes Martinique, Betty G, MD  risperiDONE (RISPERDAL) 0.25 MG tablet 1  bid Patient taking differently: Take 0.25 mg by mouth 2 (two) times daily. 1  bid 11/17/19  Yes Plovsky, Berneta Sages, MD  carbamide peroxide (DEBROX) 6.5 % OTIC solution Place 5 drops into the left ear 2 (two) times daily. Patient not taking: Reported on 11/10/2019 05/10/19   Martinique, Betty G, MD  hydrocortisone (ANUSOL-HC) 25 MG suppository Place 1 suppository (25 mg total) rectally at bedtime. Patient not taking: Reported on 11/10/2019 06/11/19   Mauri Pole, MD  traZODone (DESYREL) 50 MG tablet Take 1 tablet (50 mg total) by mouth at bedtime. Patient not taking: Reported on 11/19/2019 08/19/18   Norma Fredrickson, MD    Allergies    Other  Review of Systems   Review of Systems  Constitutional: Positive for diaphoresis. Negative for appetite change, chills and fever.  HENT: Negative for ear pain, rhinorrhea, sneezing and sore throat.   Eyes: Negative for photophobia and visual disturbance.  Respiratory: Negative for cough, chest tightness, shortness of breath and wheezing.   Cardiovascular: Positive for chest pain. Negative for palpitations.  Gastrointestinal: Negative for abdominal pain, blood in stool, constipation, diarrhea, nausea and vomiting.  Genitourinary: Negative for dysuria, hematuria and urgency.    Musculoskeletal: Positive for myalgias.  Skin: Negative for rash.  Neurological: Negative for dizziness, weakness and light-headedness.    Physical Exam Updated Vital Signs BP (!) 147/89 (BP Location: Left Arm)   Pulse 71   Temp 98 F (36.7 C) (Oral)   Resp 16   Ht 5\' 3"  (1.6 m)   Wt 66.7 kg   LMP  (LMP Unknown)   SpO2 100%   BMI 26.04 kg/m   Physical Exam Vitals and nursing note reviewed.  Constitutional:      General: She is not in acute distress.    Appearance: She is well-developed.  HENT:     Head: Normocephalic and atraumatic.     Nose: Nose normal.  Eyes:     General: No scleral icterus.       Left  eye: No discharge.     Conjunctiva/sclera: Conjunctivae normal.  Cardiovascular:     Rate and Rhythm: Normal rate and regular rhythm.     Heart sounds: Normal heart sounds. No murmur heard.  No friction rub. No gallop.   Pulmonary:     Effort: Pulmonary effort is normal. No respiratory distress.     Breath sounds: Normal breath sounds.  Chest:     Chest wall: Tenderness present.    Abdominal:     General: Bowel sounds are normal. There is no distension.     Palpations: Abdomen is soft.     Tenderness: There is no abdominal tenderness. There is no guarding.  Musculoskeletal:        General: Normal range of motion.     Cervical back: Normal range of motion and neck supple.  Skin:    General: Skin is warm and dry.     Findings: No rash.  Neurological:     Mental Status: She is alert.     Motor: No abnormal muscle tone.     Coordination: Coordination normal.     ED Results / Procedures / Treatments   Labs (all labs ordered are listed, but only abnormal results are displayed) Labs Reviewed  COMPREHENSIVE METABOLIC PANEL - Abnormal; Notable for the following components:      Result Value   Creatinine, Ser 1.08 (*)    GFR, Estimated 51 (*)    All other components within normal limits  CBC WITH DIFFERENTIAL/PLATELET - Abnormal; Notable for the following  components:   RBC 3.81 (*)    Hemoglobin 11.0 (*)    HCT 33.3 (*)    All other components within normal limits  RESPIRATORY PANEL BY RT PCR (FLU A&B, COVID)  TROPONIN I (HIGH SENSITIVITY)    EKG EKG Interpretation  Date/Time:  Friday November 19 2019 12:50:24 EDT Ventricular Rate:  68 PR Interval:    QRS Duration: 96 QT Interval:  405 QTC Calculation: 431 R Axis:   47 Text Interpretation: Sinus rhythm NO STEMI Confirmed by Octaviano Glow [phone]) on 11/19/2019 1:53:14 PM   Radiology DG Chest 2 View  Result Date: 11/19/2019 CLINICAL DATA:  Chest pain EXAM: CHEST - 2 VIEW COMPARISON:  11/12/2019 FINDINGS: The heart size and mediastinal contours are within normal limits. Both lungs are clear. The visualized skeletal structures are unremarkable. IMPRESSION: No active cardiopulmonary disease. Electronically Signed   By: Franchot Gallo M.D.   On: 11/19/2019 13:30   CT Angio Chest W/Cm &/Or Wo Cm  Result Date: 11/19/2019 CLINICAL DATA:  Chest tightness, evaluate for PE EXAM: CT ANGIOGRAPHY CHEST WITH CONTRAST TECHNIQUE: Multidetector CT imaging of the chest was performed using the standard protocol during bolus administration of intravenous contrast. Multiplanar CT image reconstructions and MIPs were obtained to evaluate the vascular anatomy. CONTRAST:  33mL ISOVUE-370 IOPAMIDOL (ISOVUE-370) INJECTION 76% COMPARISON:  None. FINDINGS: Cardiovascular: Satisfactory opacification the bilateral pulmonary arteries to the segmental level. No evidence of pulmonary embolism. Although not tailored for evaluation of the thoracic aorta, there is no evidence of thoracic aortic aneurysm or dissection. Atherosclerotic calcifications of the aortic arch. Heart is normal in size.  No pericardial effusion. Mediastinum/Nodes: No suspicious mediastinal lymphadenopathy. Visualized thyroid is unremarkable. Lungs/Pleura: Minimal dependent atelectasis in the bilateral lower lobes. Evaluation of the parenchyma is  constrained by respiratory motion. Within that constraint, there are no suspicious pulmonary nodules. No focal consolidation. No pleural effusion or pneumothorax. Upper Abdomen: Visualized upper abdomen is notable for prior cholecystectomy and  mild vascular calcifications. Musculoskeletal: Visualized osseous structures are within normal limits. Review of the MIP images confirms the above findings. IMPRESSION: No evidence of pulmonary embolism. No evidence of acute cardiopulmonary disease. Aortic Atherosclerosis (ICD10-I70.0). Electronically Signed   By: Julian Hy M.D.   On: 11/19/2019 08:47    Procedures Procedures (including critical care time)  Medications Ordered in ED Medications  acetaminophen (TYLENOL) tablet 650 mg (650 mg Oral Given 11/19/19 1502)    ED Course  I have reviewed the triage vital signs and the nursing notes.  Pertinent labs & imaging results that were available during my care of the patient were reviewed by me and considered in my medical decision making (see chart for details).  Clinical Course as of Nov 19 1534  Fri Nov 19, 2019  1319 CT angio of the chest this morning was negative for PE or other acute abnormality.   [HK]  26 This is a 74 year old female presenting to emergency department with complaint of right-sided chest pain and headache.  Patient reports onset of chest pain while driving today.  She describes a pressure sensation in the right side of her chest.  This is similar to sensation that she has had in the past week to 2 weeks.  Her PCP worked her up for possible clot, and in fact she got a CT PE study done this morning, which showed no evidence of pulmonary embolism.  Following the scan she has recurrent chest pain.  Subsequently she development of a gradual onset of headache that is posterior in the back of her head.  She does suffer from chronic headaches but this 1 feels different.  She denies any vision changes.  She denies nausea or vomiting  or dizziness.  On exam she appears comfortable.  She has no focal neurological deficits.  She does have pinpoint trigger tenderness near the posterior base of the skull, suggestive of a possible muscular origin of her headache.  She has no unilateral calf or lower extremity swelling or tenderness to suggest DVT.  She has minimal chest wall tenderness.  Her EKG per my interpretation shows normal sinus rhythm with no acute ischemia.  She is pending a troponin level as well as a CMP.  We will also reswab her for Covid, as she was vaccinated back in January and may have waning immunity.   [MT]  1450 If her troponin is negative, I do believe would be reasonable discharge with cardiology follow-up.  Given her age and her risk factors including high blood pressure, I do think she should see a cardiologist for cardiac screening.    [MT]    Clinical Course User Index [HK] Delia Heady, PA-C [MT] Langston Masker, Carola Rhine, MD   MDM Rules/Calculators/A&P                          Nancy Herman was evaluated in Emergency Department on 11/19/19 for the symptoms described in the history of present illness. He/she was evaluated in the context of the global COVID-19 pandemic, which necessitated consideration that the patient might be at risk for infection with the SARS-CoV-2 virus that causes COVID-19. Institutional protocols and algorithms that pertain to the evaluation of patients at risk for COVID-19 are in a state of rapid change based on information released by regulatory bodies including the CDC and federal and state organizations. These policies and algorithms were followed during the patient's care in the ED.  74 year old female presenting to the  ED with a chief complaint of chest pain and leg pain.  Was driving when she began having left leg pain and then shortly thereafter started having right-sided chest pain.  Symptoms improved significantly after about 5 minutes.  She reports chest tightness at present.   Denies any leg pain.  Had similar pain last week that was worked up by her primary care provider including a CT angio of her chest this morning which was unremarkable.  She denies history of DVT, PE or MI.  She does have some tenderness of the chest wall on exam.  Abdomen is soft, nontender nondistended.  She is afebrile, she is not tachycardic, tachypneic or hypoxic.  EKG shows sinus rhythm. CXR is negative for acute abnormality.  CBC, BMP and troponin is negative x1. Improvement here with Tylenol.  She remains hemodynamically stable.  Due to her reassuring work-up last week, today as well as a negative CT angio suspect that her symptoms are due to muscle skeletal versus anxiety.  Doubt ACS due to her work-up.  No other structural cause on her imaging.  Her follow-up with cardiology, PCP and return for worsening symptoms.   Patient is hemodynamically stable, in NAD, and able to ambulate in the ED. Evaluation does not show pathology that would require ongoing emergent intervention or inpatient treatment. I explained the diagnosis to the patient. Pain has been managed and has no complaints prior to discharge. Patient is comfortable with above plan and is stable for discharge at this time. All questions were answered prior to disposition. Strict return precautions for returning to the ED were discussed. Encouraged follow up with PCP.   An After Visit Summary was printed and given to the patient.   Portions of this note were generated with [employer]. Dictation errors may occur despite best attempts at proofreading.  Final Clinical Impression(s) / ED Diagnoses Final diagnoses:  Chest wall pain  Educated about COVID-19 virus infection    Rx / DC Orders ED Discharge Orders    None       Delia Heady, PA-C 11/19/19 1536    Wyvonnia Dusky, MD 11/19/19 1815

## 2019-11-19 NOTE — Discharge Instructions (Signed)
We will contact you with the results of your Covid test if it is positive. Follow-up with your cardiologist or the 1 listed below as well as your primary care provider. Return to the ER if you start to experience worsening chest pain, leg pain or swelling, shortness of breath.

## 2019-11-19 NOTE — ED Triage Notes (Addendum)
Pt biba from a [employer] parking lot following sudden onset right-sided chest pain and pain in left arm/leg. Per pt, pain has since subsided. Hx of anxiety, takes prozac. Pt's daughter told EMS she believes "this is just stress."  EMS vitals: 156/88 HR 79 RR 19 O2 97%

## 2019-11-22 ENCOUNTER — Telehealth: Payer: Self-pay | Admitting: Family Medicine

## 2019-11-22 DIAGNOSIS — R0789 Other chest pain: Secondary | ICD-10-CM

## 2019-11-22 NOTE — Telephone Encounter (Signed)
Patient went to [facility] on Friday for pain in chest and leg.  They did a EKG, Blod work and Publishing rights manager.  They also did a Covid test.  The test all showed nothing was wrong but patient was referred to see a Cardiologist.  She is requesting a name of someone she could go see.

## 2019-11-23 ENCOUNTER — Encounter: Payer: Self-pay | Admitting: Family Medicine

## 2019-11-23 DIAGNOSIS — M8580 Other specified disorders of bone density and structure, unspecified site: Secondary | ICD-10-CM | POA: Insufficient documentation

## 2019-11-23 NOTE — Telephone Encounter (Signed)
I left pt a voicemail letting her know we placed the referral & someone will contact her to schedule the appt. Advised her to call back with any questions.

## 2019-11-23 NOTE — Telephone Encounter (Signed)
We have several cardiologist in the area, all very knowledgeable. I would prefer the first available. Referral to cardiologist can be placed. Thanks, BJ

## 2019-11-24 NOTE — Progress Notes (Signed)
Cardiology Office Note   Date:  11/25/2019   ID:  Nancy, Herman [birth year], MRN [id number]  PCP:  Martinique, Betty G, MD  Cardiologist:   Minus Breeding, MD   Chief Complaint  Patient presents with  . Chest Pain      History of Present Illness: Nancy Herman is a 74 y.o. female who was  referred by Dr. Martinique for evaluation of chest pain.  She has had a negative perfusions study below in 2017.    The patient was in the emergency room in January with chest pain.   This was very atypical and no further testing was suggested.  She was again in the ED on 10/15.  I reviewed these records for this visit.    She had right sided chest pain but no objective evidence of ischemia and no further cardiac work up was suggested.  Of note she did have a CT negative for PE and she did have aortic atherosclerosis but no mention of coronary calcification.    She described the pain as right-sided.  There is also some left leg pain.  It was sharp.  There were 2 episodes of it.  It was sporadic and happen at rest.  She has not had any associated symptoms such as nausea vomiting or diaphoresis.  She has not had any palpitations, presyncope or syncope.  She does a little exercising riding a stationary bike.  She walks her small dog.  She does not bring on symptoms with this.  Past Medical History:  Diagnosis Date  . Anxiety   . Frequent headaches   . GERD (gastroesophageal reflux disease)   . Hyperlipidemia   . IBS (irritable bowel syndrome)   . 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 Thyroid disease     Past Surgical History:  Procedure Laterality Date  . ABDOMINAL HYSTERECTOMY  1990  . ANAL RECTAL MANOMETRY N/A 08/14/2018   Procedure: ANO RECTAL MANOMETRY;  Surgeon: Mauri Pole, MD;  Location: [facility];  Service: Endoscopy;  Laterality: N/A;  . BRAIN SURGERY    . BREAST EXCISIONAL BIOPSY Right unsure  . BREAST EXCISIONAL BIOPSY Left unsure  . OVARIAN CYST SURGERY  1994  .  REPAIR RECTOCELE  2016   and prolapsed uterus.  . TONSILLECTOMY  1973     Current Outpatient Medications  Medication Sig Dispense Refill  . cetirizine (ZYRTEC) 10 MG tablet Take 10 mg by mouth daily.    . famotidine (PEPCID) 20 MG tablet Take 1 tablet (20 mg total) by mouth 2 (two) times daily. 90 tablet 1  . FLUoxetine (PROZAC) 20 MG capsule Take 1 capsule (20 mg total) by mouth daily. 30 capsule 5  . hydrOXYzine (VISTARIL) 25 MG capsule 1 qhs (Patient taking differently: Take 25 mg by mouth at bedtime. 1 qhs) 30 capsule 4  . montelukast (SINGULAIR) 10 MG tablet TAKE 1 TABLET BY MOUTH AT BEDTIME (Patient taking differently: Take 10 mg by mouth at bedtime. ) 90 tablet 2  . pantoprazole (PROTONIX) 40 MG tablet Take 1 tablet by mouth once daily (Patient taking differently: Take 40 mg by mouth daily. ) 90 tablet 1  . polyethylene glycol (MIRALAX) packet Take 17 g by mouth daily. 14 each 0  . pravastatin (PRAVACHOL) 20 MG tablet Take 1 tablet by mouth once daily (Patient taking differently: Take 20 mg by mouth daily. ) 90 tablet 2  . risperiDONE (RISPERDAL) 0.25 MG tablet 1  bid (Patient taking differently: Take 0.25  mg by mouth 2 (two) times daily. 1  bid) 60 tablet 4  . traZODone (DESYREL) 50 MG tablet Take 1 tablet (50 mg total) by mouth at bedtime. (Patient taking differently: Take 50 mg by mouth at bedtime as needed. Pt takes as needed) 30 tablet 5  . carbamide peroxide (DEBROX) 6.5 % OTIC solution Place 5 drops into the left ear 2 (two) times daily. (Patient not taking: Reported on 11/10/2019) 15 mL 0  . hydrocortisone (ANUSOL-HC) 25 MG suppository Place 1 suppository (25 mg total) rectally at bedtime. (Patient not taking: Reported on 11/10/2019) 12 suppository 3   No current facility-administered medications for this visit.    Allergies:   Other    ROS:  Please see the history of present illness.   Otherwise, review of systems are positive for none.   All other systems are reviewed and  negative.    PHYSICAL EXAM: VS:  BP 99/69   Pulse 79   Ht 5\' 3"  (1.6 m)   Wt 151 lb 3.2 oz (68.6 kg)   LMP  (LMP Unknown)   SpO2 99%   BMI 26.78 kg/m  , BMI Body mass index is 26.78 kg/m. GENERAL:  Well appearing NECK:  No jugular venous distention, waveform within normal limits, carotid upstroke brisk and symmetric, no bruits, no thyromegaly LUNGS:  Clear to auscultation bilaterally CHEST:  Unremarkable HEART:  PMI not displaced or sustained,S1 and S2 within normal limits, no S3, no S4, no clicks, no rubs, no murmurs ABD:  Flat, positive bowel sounds normal in frequency in pitch, no bruits, no rebound, no guarding, no midline pulsatile mass, no hepatomegaly, no splenomegaly EXT:  2 plus pulses throughout, no edema, no cyanosis no clubbing  EKG:  EKG is ordered today. The ekg ordered today demonstrates sinus rhythm, rate 86, axis within normal limits, intervals within normal limits, no acute ST-T wave changes.   Recent Labs: 11/12/2019: TSH 2.75 11/19/2019: ALT 14; BUN 11; Creatinine, Ser 1.08; Hemoglobin 11.0; Platelets 207; Potassium 3.5; Sodium 142    Lipid Panel    Component Value Date/Time   CHOL 222 (H) 11/12/2019 1317   TRIG 64 11/12/2019 1317   HDL 95 11/12/2019 1317   CHOLHDL 2.3 11/12/2019 1317   VLDL 17.0 11/09/2018 1231   LDLCALC 112 (H) 11/12/2019 1317      Wt Readings from Last 3 Encounters:  11/25/19 151 lb 3.2 oz (68.6 kg)  11/19/19 147 lb (66.7 kg)  11/12/19 147 lb 6.4 oz (66.9 kg)      Other studies Reviewed: Additional studies/ records that were reviewed today include: ED records. Review of the above records demonstrates:  Please see elsewhere in the note.     ASSESSMENT AND PLAN:  Chest pain:  Patient's chest pain is atypical.  I think the pretest probability of obstructive coronary disease is somewhat low. I will bring the patient back for a POET (Plain Old Exercise Test). This will allow me to screen for obstructive coronary disease, risk  stratify and very importantly provide a prescription for exercise.  Dyslipidemia: LDL is slightly above target at 112 but her HDL is 95 so the ratio was excellent.  No change in therapy.  Her previous Framingham risk score was 8.6%.   Current medicines are reviewed at length with the patient today.  The patient does not have concerns regarding medicines.  The following changes have been made:  no change  Labs/ tests ordered today include:   Orders Placed This Encounter  Procedures  .  Exercise Tolerance Test  . EKG 12-Lead     Disposition:   FU with me as needed.     Signed, Minus Breeding, MD  11/25/2019 9:58 AM    [facility]

## 2019-11-25 ENCOUNTER — Ambulatory Visit (INDEPENDENT_AMBULATORY_CARE_PROVIDER_SITE_OTHER): Payer: Medicare HMO | Admitting: Cardiology

## 2019-11-25 ENCOUNTER — Encounter: Payer: Self-pay | Admitting: Cardiology

## 2019-11-25 ENCOUNTER — Other Ambulatory Visit: Payer: Self-pay

## 2019-11-25 ENCOUNTER — Telehealth: Payer: Self-pay | Admitting: *Deleted

## 2019-11-25 VITALS — BP 99/69 | HR 79 | Ht 63.0 in | Wt 151.2 lb

## 2019-11-25 DIAGNOSIS — R072 Precordial pain: Secondary | ICD-10-CM

## 2019-11-25 DIAGNOSIS — E785 Hyperlipidemia, unspecified: Secondary | ICD-10-CM

## 2019-11-25 NOTE — Patient Instructions (Signed)
Medication Instructions:  No changes *If you need a refill on your cardiac medications before your next appointment, please call your pharmacy*   Lab Work: None ordered If you have labs (blood work) drawn today and your tests are completely normal, you will receive your results only by: Marland Kitchen MyChart Message (if you have MyChart) OR . A paper copy in the mail If you have any lab test that is abnormal or we need to change your treatment, we will call you to review the results.   Testing/Procedures: Your physician has requested that you have an exercise tolerance test. For further information please visit [email or web address]. Please also follow instruction sheet, as given. This will take place at [address], [address].  Do not drink or eat foods with caffeine for 24 hours before the test. (Chocolate, coffee, tea, or energy drinks)  If you use an inhaler, bring it with you to the test.  Do not smoke for 4 hours before the test.  Wear comfortable shoes and clothing.  You will need to have the coronavirus test completed prior to your procedure. This is a Drive Up Visit at the [address] [place]. Please tell them that you are there for pre-procedure testing. Someone will direct you to the appropriate testing line. Stay in your car and someone will be with you shortly. Please make sure to have all other labs completed before this test because you will need to stay quarantined until your procedure. Please take your insurance card to this test.    Follow-Up: At [facility], you and your health needs are our priority.  As part of our continuing mission to provide you with exceptional heart care, we have created designated Provider Care Teams.  These Care Teams include your primary Cardiologist (physician) and Advanced Practice Providers (APPs -  Physician Assistants and Nurse Practitioners) who all work together to provide you with the care you need, when you need it.  We  recommend signing up for the patient portal called "MyChart".  Sign up information is provided on this After Visit Summary.  MyChart is used to connect with patients for Virtual Visits (Telemedicine).  Patients are able to view lab/test results, encounter notes, upcoming appointments, etc.  Non-urgent messages can be sent to your provider as well.   To learn more about what you can do with MyChart, go to [email or web address].    Your next appointment:   You will follow up with Dr. Percival Spanish on an as-needed basis.   Provider:   Dr. Minus Breeding, MD   Other Instructions None

## 2019-11-25 NOTE — Telephone Encounter (Signed)
A message was left, re: change in her appointment time from 0915 am to 3:30. Appointment was mailed.

## 2019-11-30 ENCOUNTER — Other Ambulatory Visit (HOSPITAL_COMMUNITY)
Admission: RE | Admit: 2019-11-30 | Discharge: 2019-11-30 | Disposition: A | Discharge status: Home / Self Care | Payer: Medicare HMO | Source: Ambulatory Visit | Attending: Cardiology | Admitting: Cardiology

## 2019-11-30 DIAGNOSIS — Z01812 Encounter for preprocedural laboratory examination: Secondary | ICD-10-CM | POA: Diagnosis present

## 2019-11-30 DIAGNOSIS — Z20822 Contact with and (suspected) exposure to covid-19: Secondary | ICD-10-CM | POA: Insufficient documentation

## 2019-11-30 LAB — SARS CORONAVIRUS 2 (TAT 6-24 HRS): SARS Coronavirus 2: NEGATIVE

## 2019-12-01 ENCOUNTER — Telehealth (HOSPITAL_COMMUNITY): Payer: Self-pay | Admitting: *Deleted

## 2019-12-01 NOTE — Telephone Encounter (Signed)
Close encounter 

## 2019-12-02 ENCOUNTER — Other Ambulatory Visit: Payer: Self-pay | Admitting: Family Medicine

## 2019-12-03 ENCOUNTER — Ambulatory Visit (HOSPITAL_COMMUNITY)
Admission: RE | Admit: 2019-12-03 | Discharge: 2019-12-03 | Disposition: A | Discharge status: Home / Self Care | Payer: Medicare HMO | Source: Ambulatory Visit | Attending: Internal Medicine | Admitting: Internal Medicine

## 2019-12-03 ENCOUNTER — Ambulatory Visit (HOSPITAL_COMMUNITY): Payer: Medicare HMO | Admitting: Psychiatry

## 2019-12-03 ENCOUNTER — Other Ambulatory Visit: Payer: Self-pay

## 2019-12-03 ENCOUNTER — Encounter (HOSPITAL_COMMUNITY): Payer: Medicare HMO

## 2019-12-03 DIAGNOSIS — R072 Precordial pain: Secondary | ICD-10-CM | POA: Diagnosis not present

## 2019-12-03 LAB — EXERCISE TOLERANCE TEST
Estimated workload: 6.6 METS
Exercise duration (min): 4 min
Exercise duration (sec): 40 s
MPHR: 148 {beats}/min
Peak HR: 136 {beats}/min
Percent HR: 91 %
Rest HR: 71 {beats}/min

## 2019-12-04 ENCOUNTER — Ambulatory Visit: Payer: Medicare HMO | Attending: Internal Medicine

## 2019-12-04 DIAGNOSIS — Z23 Encounter for immunization: Secondary | ICD-10-CM

## 2019-12-04 NOTE — Progress Notes (Signed)
   Covid-19 Vaccination Clinic  Name:  Nancy Herman    MRN: [id number] DOB: 10/19/1945  12/04/2019  Nancy Herman was observed post Covid-19 immunization for 15 minutes without incident. She was provided with Vaccine Information Sheet and instruction to access the V-Safe system.   Nancy Herman was instructed to call 911 with any severe reactions post vaccine: Marland Kitchen Difficulty breathing  . Swelling of face and throat  . A fast heartbeat  . A bad rash all over body  . Dizziness and weakness

## 2019-12-08 ENCOUNTER — Telehealth: Payer: Self-pay | Admitting: Cardiology

## 2019-12-08 NOTE — Telephone Encounter (Signed)
Spoke with the patient and went over result and Dr. Rosezella Florida recommendations.

## 2019-12-08 NOTE — Telephone Encounter (Signed)
Patient calling for ETT results.

## 2019-12-10 ENCOUNTER — Ambulatory Visit (INDEPENDENT_AMBULATORY_CARE_PROVIDER_SITE_OTHER): Payer: Medicare HMO | Admitting: Psychiatry

## 2019-12-10 ENCOUNTER — Other Ambulatory Visit: Payer: Self-pay

## 2019-12-10 DIAGNOSIS — F323 Major depressive disorder, single episode, severe with psychotic features: Secondary | ICD-10-CM | POA: Diagnosis not present

## 2019-12-10 MED ORDER — RISPERIDONE 0.25 MG PO TABS
ORAL_TABLET | ORAL | 4 refills | Status: DC
Start: 2019-12-10 — End: 2020-01-07

## 2019-12-10 NOTE — Progress Notes (Signed)
Psychiatric Initial Adult Assessment   Patient Identification: Nancy Herman MRN:  [id number] Date of Evaluation:  12/10/2019 Referral Source: Dr. Betty Martinique Chief Complaint:   Visit Diagnosis:  No diagnosis found.  History of Present Illness:   Today the patient is no better back she seems a bit worse.  She hears people talking upstairs in her attic.  They are talking about things that she was doing things that she is doing they seem to be able to tell who she is talking to on the phone.  She states they are actually listening to her on the phone.  She believes they call her back to talk to her even more.  He does bother her a lot.  They do not command her demand her to do anything but they clearly appear to her to be intrusive and invading her life.  The patient continues to eat well.  She does not want to take the Vistaril because she does not want to sleep too deep.  This is obviously because she feels more guarded and somewhat paranoid.  She denies being depressed but certainly she appears agitated and on edge.  She does not drink any alcohol or use any drugs.  Unfortunately her son [name] was not there for this interview.   Depression Symptoms:  difficulty concentrating, (Hypo) Manic Symptoms:   Anxiety Symptoms:   Psychotic Symptoms:   PTSD Symptoms:   Past Psychiatric History: Prozac for many years  Previous Psychotropic Medications:   Substance Abuse History in the last 12 months:  No.  Consequences of Substance Abuse:   Past Medical History:  Past Medical History:  Diagnosis Date  . Anxiety   . Frequent headaches   . GERD (gastroesophageal reflux disease)   . Hyperlipidemia   . IBS (irritable bowel syndrome)   . 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 Thyroid disease     Past Surgical History:  Procedure Laterality Date  . ABDOMINAL HYSTERECTOMY  1990  . ANAL RECTAL MANOMETRY N/A 08/14/2018   Procedure: ANO RECTAL MANOMETRY;  Surgeon: Mauri Pole, MD;   Location: [facility];  Service: Endoscopy;  Laterality: N/A;  . BRAIN SURGERY    . BREAST EXCISIONAL BIOPSY Right unsure  . BREAST EXCISIONAL BIOPSY Left unsure  . OVARIAN CYST SURGERY  1994  . REPAIR RECTOCELE  2016   and prolapsed uterus.  . TONSILLECTOMY  1973    Family Psychiatric History:   Family History:  Family History  Problem Relation Age of Onset  . Heart disease Mother        No details   . Alcohol abuse Mother   . Drug abuse Mother   . Sickle cell anemia Mother   . Heart disease Father        No details . Questionable bypass or stent  . Drug abuse Father   . Alcohol abuse Father     Social History:   Social History   Socioeconomic History  . Marital status: Widowed    Spouse name: Not on file  . Number of children: 2  . Years of education: Not on file  . Highest education level: Not on file  Occupational History  . Occupation: retired  Tobacco Use  . Smoking status: Never Smoker  . Smokeless tobacco: Never Used  Vaping Use  . Vaping Use: Never used  Substance and Sexual Activity  . Alcohol use: No  . Drug use: No  . Sexual activity: Never  Other Topics Concern  .  Not on file  Social History Narrative   Lives alone.  Lives near son.  Was in [place].   Social Determinants of Health   Financial Resource Strain: Low Risk   . Difficulty of Paying Living Expenses: Not hard at all  Food Insecurity: No Food Insecurity  . Worried About Charity fundraiser in the Last Year: Never true  . Ran Out of Food in the Last Year: Never true  Transportation Needs: No Transportation Needs  . Lack of Transportation (Medical): No  . Lack of Transportation (Non-Medical): No  Physical Activity: Insufficiently Active  . Days of Exercise per Week: 3 days  . Minutes of Exercise per Session: 20 min  Stress: No Stress Concern Present  . Feeling of Stress : Only a little  Social Connections: Socially Isolated  . Frequency of Communication with Friends and Family: More  than three times a week  . Frequency of Social Gatherings with Friends and Family: Never  . Attends Religious Services: Never  . Active Member of Clubs or Organizations: No  . Attends Archivist Meetings: Never  . Marital Status: Widowed    Additional Social History:   Allergies:   Allergies  Allergen Reactions  . Other Other (See Comments)    Seasonal allergies    Metabolic Disorder Labs: Lab Results  Component Value Date   HGBA1C 5.9 11/09/2018   Lab Results  Component Value Date   PROLACTIN 14.6 03/05/2019   PROLACTIN 5.0 03/04/2018   Lab Results  Component Value Date   CHOL 222 (H) 11/12/2019   TRIG 64 11/12/2019   HDL 95 11/12/2019   CHOLHDL 2.3 11/12/2019   VLDL 17.0 11/09/2018   LDLCALC 112 (H) 11/12/2019   LDLCALC 116 (H) 11/09/2018   Lab Results  Component Value Date   TSH 2.75 11/12/2019    Therapeutic Level Labs: No results found for: LITHIUM No results found for: CBMZ No results found for: VALPROATE  Current Medications: Current Outpatient Medications  Medication Sig Dispense Refill  . carbamide peroxide (DEBROX) 6.5 % OTIC solution Place 5 drops into the left ear 2 (two) times daily. (Patient not taking: Reported on 11/10/2019) 15 mL 0  . cetirizine (ZYRTEC) 10 MG tablet Take 10 mg by mouth daily.    . famotidine (PEPCID) 20 MG tablet Take 1 tablet (20 mg total) by mouth 2 (two) times daily. 90 tablet 1  . FLUoxetine (PROZAC) 20 MG capsule Take 1 capsule (20 mg total) by mouth daily. 30 capsule 5  . hydrocortisone (ANUSOL-HC) 25 MG suppository Place 1 suppository (25 mg total) rectally at bedtime. (Patient not taking: Reported on 11/10/2019) 12 suppository 3  . hydrOXYzine (VISTARIL) 25 MG capsule 1 qhs (Patient taking differently: Take 25 mg by mouth at bedtime. 1 qhs) 30 capsule 4  . montelukast (SINGULAIR) 10 MG tablet TAKE 1 TABLET BY MOUTH AT BEDTIME (Patient taking differently: Take 10 mg by mouth at bedtime. ) 90 tablet 2  .  pantoprazole (PROTONIX) 40 MG tablet Take 1 tablet by mouth once daily (Patient taking differently: Take 40 mg by mouth daily. ) 90 tablet 1  . polyethylene glycol (MIRALAX) packet Take 17 g by mouth daily. 14 each 0  . pravastatin (PRAVACHOL) 20 MG tablet Take 1 tablet by mouth once daily 90 tablet 3  . risperiDONE (RISPERDAL) 0.25 MG tablet 1  bid (Patient taking differently: Take 0.25 mg by mouth 2 (two) times daily. 1  bid) 60 tablet 4  . traZODone (DESYREL)  50 MG tablet Take 1 tablet (50 mg total) by mouth at bedtime. (Patient taking differently: Take 50 mg by mouth at bedtime as needed. Pt takes as needed) 30 tablet 5   No current facility-administered medications for this visit.    Musculoskeletal: Strength & Muscle Tone: within normal limits Gait & Station: normal Patient leans:   Psychiatric Specialty Exam: ROS  There were no vitals taken for this visit.There is no height or weight on file to calculate BMI.  General Appearance: Casual  Eye Contact:  Good  Speech: Good  Volume:  Normal  Mood:  NA  Affect:  NA and Appropriate  Thought Process:  Coherent  Orientation:  Full (Time, Place, and Person)  Thought Content:  Logical  Suicidal Thoughts:  No  Homicidal Thoughts:  No  Memory:  Negative  Judgement:  Good  Insight:  Fair  Psychomotor Activity:  Normal  Concentration:    Recall:  [place] of Knowledge:Good  Language: Good  Akathisia:  No  Handed:    AIMS (if indicated):  not done  Assets:  Desire for Improvement  ADL's:  Intact  Cognition: WNL  Sleep:  Good   Screenings: PHQ2-9     Clinical Support from 11/10/2019 in [place] at [facility] from 11/09/2018 in [place] at [facility] from 12/03/2017 in [place] at [facility] from 10/28/2017 in [place] at [facility] from 06/16/2017 in [place] at [facility] Total Score 1 0 0 0 0  PHQ-9 Total Score -- --  -- -- 0      Assessment and Plan:  11/5/202110:59 AM   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  At this time it seems her psychosis seems to be more prominent than the first thought.  She will continue taking Prozac but today she will go ahead and increase Risperdal to taking 0.25 mg 1 in the morning and 2 at night.  For now she will take no Vistaril.  My hope is that to Risperdal will help her sleep at night and possibly reduce her psychotic symptoms.  At this time I think it is important that I actually see the patient physically and I will advise her to come to the office in 1 to 2 months.  At that time she will bring [facility].  This is important to have a family involved.  I do not think the patient is dangerous to herself or anyone else.  I do think her psychosis is significant and requires a dose adjustment.

## 2020-01-07 ENCOUNTER — Telehealth (INDEPENDENT_AMBULATORY_CARE_PROVIDER_SITE_OTHER): Payer: Medicare HMO | Admitting: Psychiatry

## 2020-01-07 ENCOUNTER — Other Ambulatory Visit: Payer: Self-pay

## 2020-01-07 DIAGNOSIS — F329 Major depressive disorder, single episode, unspecified: Secondary | ICD-10-CM | POA: Diagnosis not present

## 2020-01-07 DIAGNOSIS — F32A Depression, unspecified: Secondary | ICD-10-CM

## 2020-01-07 DIAGNOSIS — F411 Generalized anxiety disorder: Secondary | ICD-10-CM | POA: Diagnosis not present

## 2020-01-07 DIAGNOSIS — F323 Major depressive disorder, single episode, severe with psychotic features: Secondary | ICD-10-CM

## 2020-01-07 MED ORDER — RISPERIDONE 0.25 MG PO TABS
ORAL_TABLET | ORAL | 5 refills | Status: DC
Start: 2020-01-07 — End: 2020-04-11

## 2020-01-07 MED ORDER — FLUOXETINE HCL 20 MG PO CAPS: 20.0000 mg | ORAL_CAPSULE | Freq: Every day | ORAL | 5 refills | Status: DC

## 2020-01-07 NOTE — Progress Notes (Signed)
Psychiatric Initial Adult Assessment   Patient Identification: Nancy Herman MRN:  [id number] Date of Evaluation:  01/07/2020 Referral Source: Dr. Betty Martinique Chief Complaint:   Visit Diagnosis:    ICD-10-CM   1. Depressive disorder  F32.9 FLUoxetine (PROZAC) 20 MG capsule  2. Generalized anxiety disorder  F41.1 FLUoxetine (PROZAC) 20 MG capsule    History of Present Illness:   Today the patient fortunately is doing much better.  The disturbances the voices the control paranoid from above in her attic is now gone.  She did increase the Risperdal to 0.25 mg 1 in the morning and 2 at night.  Unfortunately the 2 at night caused her what sounds like a little bit of akathisia.  So she just took 1 in the morning and 1 at night now she is doing great.  She continues taking Prozac.  So this dose 0.5 mg a day of Risperdal her psychosis seems to have abated.  She was interviewed alone.  The patient says she is sleeping and eating well.  She enjoys her 6 grandchildren and her 2 great-grandchildren.  She had a good Thanksgiving.  She is looking forward to Christmas.  She drinks no alcohol uses no drugs she sleeps and eats well.  Her psychotic symptomatology has not gone away.  She is very pleased.  She will continue taking Risperdal 0.25 mg twice daily.  Medically she seems to be stable.  Depression Symptoms:  difficulty concentrating, (Hypo) Manic Symptoms:   Anxiety Symptoms:   Psychotic Symptoms:   PTSD Symptoms:   Past Psychiatric History: Prozac for many years  Previous Psychotropic Medications:   Substance Abuse History in the last 12 months:  No.  Consequences of Substance Abuse:   Past Medical History:  Past Medical History:  Diagnosis Date  . Anxiety   . Frequent headaches   . GERD (gastroesophageal reflux disease)   . Hyperlipidemia   . IBS (irritable bowel syndrome)   . 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 Thyroid disease     Past Surgical History:  Procedure Laterality  Date  . ABDOMINAL HYSTERECTOMY  1990  . ANAL RECTAL MANOMETRY N/A 08/14/2018   Procedure: ANO RECTAL MANOMETRY;  Surgeon: Mauri Pole, MD;  Location: [facility];  Service: Endoscopy;  Laterality: N/A;  . BRAIN SURGERY    . BREAST EXCISIONAL BIOPSY Right unsure  . BREAST EXCISIONAL BIOPSY Left unsure  . OVARIAN CYST SURGERY  1994  . REPAIR RECTOCELE  2016   and prolapsed uterus.  . TONSILLECTOMY  1973    Family Psychiatric History:   Family History:  Family History  Problem Relation Age of Onset  . Heart disease Mother        No details   . Alcohol abuse Mother   . Drug abuse Mother   . Sickle cell anemia Mother   . Heart disease Father        No details . Questionable bypass or stent  . Drug abuse Father   . Alcohol abuse Father     Social History:   Social History   Socioeconomic History  . Marital status: Widowed    Spouse name: Not on file  . Number of children: 2  . Years of education: Not on file  . Highest education level: Not on file  Occupational History  . Occupation: retired  Tobacco Use  . Smoking status: Never Smoker  . Smokeless tobacco: Never Used  Vaping Use  . Vaping Use: Never used  Substance and  Sexual Activity  . Alcohol use: No  . Drug use: No  . Sexual activity: Never  Other Topics Concern  . Not on file  Social History Narrative   Lives alone.  Lives near son.  Was in [place].   Social Determinants of Health   Financial Resource Strain: Low Risk   . Difficulty of Paying Living Expenses: Not hard at all  Food Insecurity: No Food Insecurity  . Worried About Charity fundraiser in the Last Year: Never true  . Ran Out of Food in the Last Year: Never true  Transportation Needs: No Transportation Needs  . Lack of Transportation (Medical): No  . Lack of Transportation (Non-Medical): No  Physical Activity: Insufficiently Active  . Days of Exercise per Week: 3 days  . Minutes of Exercise per Session: 20 min  Stress: No Stress Concern  Present  . Feeling of Stress : Only a little  Social Connections: Socially Isolated  . Frequency of Communication with Friends and Family: More than three times a week  . Frequency of Social Gatherings with Friends and Family: Never  . Attends Religious Services: Never  . Active Member of Clubs or Organizations: No  . Attends Archivist Meetings: Never  . Marital Status: Widowed    Additional Social History:   Allergies:   Allergies  Allergen Reactions  . Other Other (See Comments)    Seasonal allergies    Metabolic Disorder Labs: Lab Results  Component Value Date   HGBA1C 5.9 11/09/2018   Lab Results  Component Value Date   PROLACTIN 14.6 03/05/2019   PROLACTIN 5.0 03/04/2018   Lab Results  Component Value Date   CHOL 222 (H) 11/12/2019   TRIG 64 11/12/2019   HDL 95 11/12/2019   CHOLHDL 2.3 11/12/2019   VLDL 17.0 11/09/2018   LDLCALC 112 (H) 11/12/2019   LDLCALC 116 (H) 11/09/2018   Lab Results  Component Value Date   TSH 2.75 11/12/2019    Therapeutic Level Labs: No results found for: LITHIUM No results found for: CBMZ No results found for: VALPROATE  Current Medications: Current Outpatient Medications  Medication Sig Dispense Refill  . carbamide peroxide (DEBROX) 6.5 % OTIC solution Place 5 drops into the left ear 2 (two) times daily. (Patient not taking: Reported on 11/10/2019) 15 mL 0  . cetirizine (ZYRTEC) 10 MG tablet Take 10 mg by mouth daily.    . famotidine (PEPCID) 20 MG tablet Take 1 tablet (20 mg total) by mouth 2 (two) times daily. 90 tablet 1  . FLUoxetine (PROZAC) 20 MG capsule Take 1 capsule (20 mg total) by mouth daily. 30 capsule 5  . hydrocortisone (ANUSOL-HC) 25 MG suppository Place 1 suppository (25 mg total) rectally at bedtime. (Patient not taking: Reported on 11/10/2019) 12 suppository 3  . hydrOXYzine (VISTARIL) 25 MG capsule 1 qhs (Patient taking differently: Take 25 mg by mouth at bedtime. 1 qhs) 30 capsule 4  .  montelukast (SINGULAIR) 10 MG tablet TAKE 1 TABLET BY MOUTH AT BEDTIME (Patient taking differently: Take 10 mg by mouth at bedtime. ) 90 tablet 2  . pantoprazole (PROTONIX) 40 MG tablet Take 1 tablet by mouth once daily (Patient taking differently: Take 40 mg by mouth daily. ) 90 tablet 1  . polyethylene glycol (MIRALAX) packet Take 17 g by mouth daily. 14 each 0  . pravastatin (PRAVACHOL) 20 MG tablet Take 1 tablet by mouth once daily 90 tablet 3  . risperiDONE (RISPERDAL) 0.25 MG tablet 1 bid  60 tablet 5  . traZODone (DESYREL) 50 MG tablet Take 1 tablet (50 mg total) by mouth at bedtime. (Patient taking differently: Take 50 mg by mouth at bedtime as needed. Pt takes as needed) 30 tablet 5   No current facility-administered medications for this visit.    Musculoskeletal: Strength & Muscle Tone: within normal limits Gait & Station: normal Patient leans:   Psychiatric Specialty Exam: ROS  There were no vitals taken for this visit.There is no height or weight on file to calculate BMI.  General Appearance: Casual  Eye Contact:  Good  Speech: Good  Volume:  Normal  Mood:  NA  Affect:  NA and Appropriate  Thought Process:  Coherent  Orientation:  Full (Time, Place, and Person)  Thought Content:  Logical  Suicidal Thoughts:  No  Homicidal Thoughts:  No  Memory:  Negative  Judgement:  Good  Insight:  Fair  Psychomotor Activity:  Normal  Concentration:    Recall:  [place] of Knowledge:Good  Language: Good  Akathisia:  No  Handed:    AIMS (if indicated):  not done  Assets:  Desire for Improvement  ADL's:  Intact  Cognition: WNL  Sleep:  Good   Screenings: PHQ2-9     Clinical Support from 11/10/2019 in [place] at [facility] from 11/09/2018 in [place] at [employer] [facility] from 12/03/2017 in [place] at [facility] from 10/28/2017 in [place] at [facility] from 06/16/2017 in [place]  at [facility] Total Score 1 0 0 0 0  PHQ-9 Total Score - - - - 0      Assessment and Plan:  12/3/202110:21 AM   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  She will continue taking Prozac as prescribed.  Today we will clarify that she will take Risperdal 0.25 mg twice daily.  The patient is doing much better.  She is functioning well.  She lives independently.  The patient will be seen again in 3 months.  Patient has had her vaccines.

## 2020-01-10 ENCOUNTER — Ambulatory Visit (INDEPENDENT_AMBULATORY_CARE_PROVIDER_SITE_OTHER): Payer: Medicare HMO | Admitting: Psychology

## 2020-01-10 DIAGNOSIS — F411 Generalized anxiety disorder: Secondary | ICD-10-CM | POA: Diagnosis not present

## 2020-01-24 ENCOUNTER — Ambulatory Visit (INDEPENDENT_AMBULATORY_CARE_PROVIDER_SITE_OTHER): Payer: Medicare HMO | Admitting: Psychology

## 2020-01-24 DIAGNOSIS — F411 Generalized anxiety disorder: Secondary | ICD-10-CM | POA: Diagnosis not present

## 2020-02-06 ENCOUNTER — Other Ambulatory Visit: Payer: Self-pay | Admitting: Family Medicine

## 2020-02-06 DIAGNOSIS — K219 Gastro-esophageal reflux disease without esophagitis: Secondary | ICD-10-CM

## 2020-02-07 NOTE — Telephone Encounter (Signed)
  famotidine (PEPCID) 20 MG tablet  [facility] [address] [place], [place] - [address] Phone:  [phone]  Fax:  [phone]     Patient is completely out of this medication and is needing it refilled today if possible

## 2020-02-09 NOTE — Telephone Encounter (Signed)
Pt is calling in to check the status of below msg

## 2020-02-22 ENCOUNTER — Ambulatory Visit: Payer: Medicare HMO | Admitting: Psychology

## 2020-02-24 ENCOUNTER — Other Ambulatory Visit: Payer: Self-pay

## 2020-02-25 IMAGING — MR MR HEAD WO/W CM
13 of 15 series · 40 of 48 positions shown · IV contrast (gadavist)
Comparison: 10/04/2017 MRI of the head.

CLINICAL DATA: 72 y/o F; history of pituitary adenoma post
resection presenting with worsening hallucinations.

EXAM:
MRI HEAD WITHOUT AND WITH CONTRAST
TECHNIQUE: Multiplanar, multiecho pulse sequences of the brain and surrounding
structures were obtained without and with intravenous contrast.
CONTRAST:  6 cc Gadavist.

[Series 5: DWI · axial · 4.0mm · 0.88mm/px · z∈[-59,+70]mm · 5 of 70 slices shown (1 of 4)]
[im 1/70]
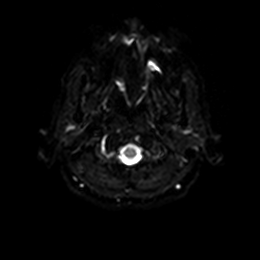
[im 18/70]
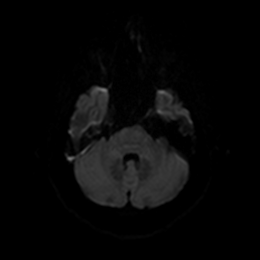
[im 35/70]
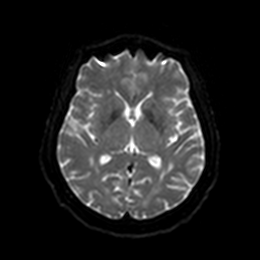
[im 52/70]
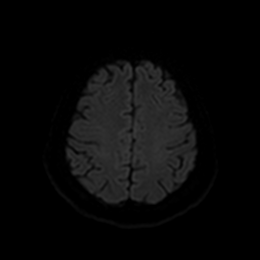
[im 70/70]
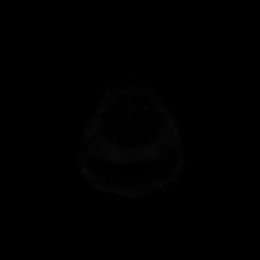

[Series 6: DWI · axial · 4.0mm · 0.88mm/px · z∈[-59,+70]mm · 2 of 35 slices shown (2 of 4)]
[im 1/35]
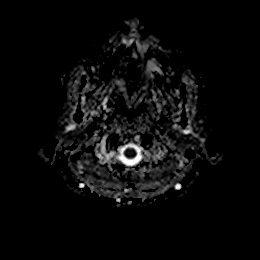
[im 35/35]
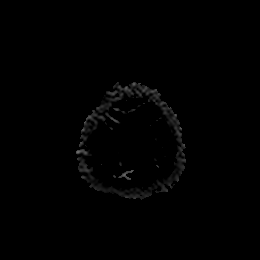

[Series 7: DWI · coronal · 4.0mm · 0.88mm/px · 5 of 64 slices shown (3 of 4)]
[im 1/64]
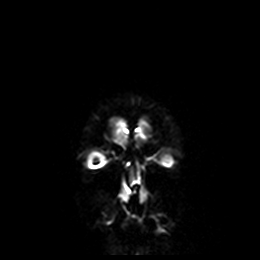
[im 16/64]
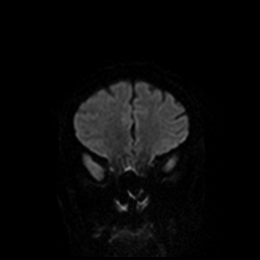
[im 32/64]
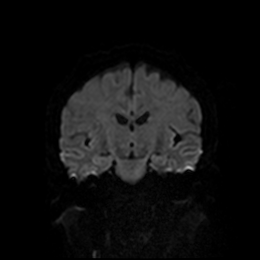
[im 48/64]
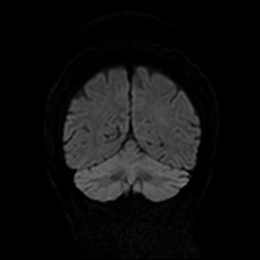
[im 64/64]
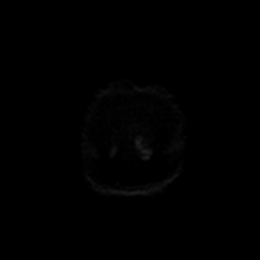

[Series 8: DWI · coronal · 4.0mm · 0.88mm/px · 2 of 32 slices shown (4 of 4)]
[im 1/32]
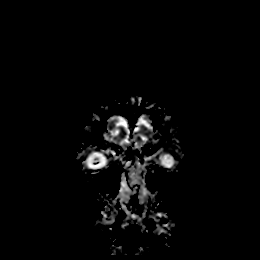
[im 32/32]
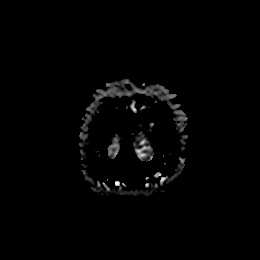

[Series 9: T1 · sagittal · 5.0mm · 0.75mm/px · 2 of 23 slices shown]
[im 1/23]
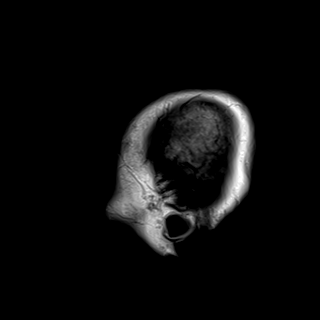
[im 23/23]
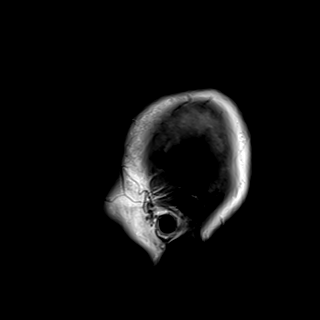

[Series 10: T2 · axial · 5.0mm · 0.72mm/px · z∈[-62,+69]mm · 2 of 24 slices shown]
[im 1/24]
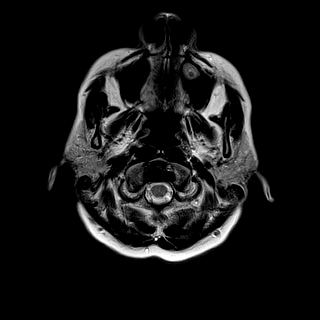
[im 24/24]
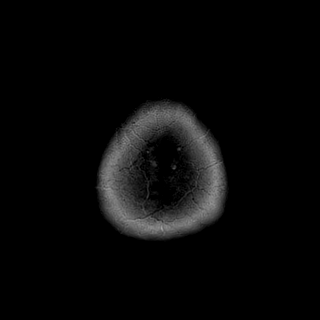

[Series 11: FLAIR · axial · 5.0mm · 0.45mm/px · z∈[-64,+67]mm · 2 of 24 slices shown]
[im 1/24]
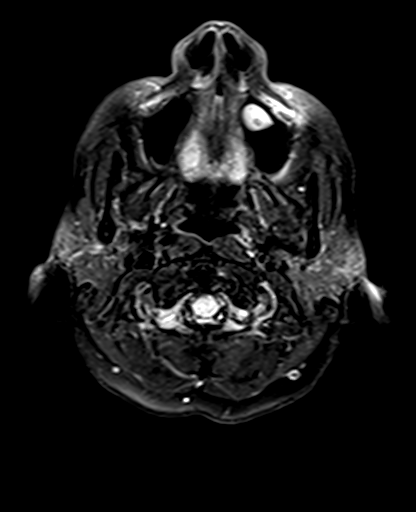
[im 24/24]
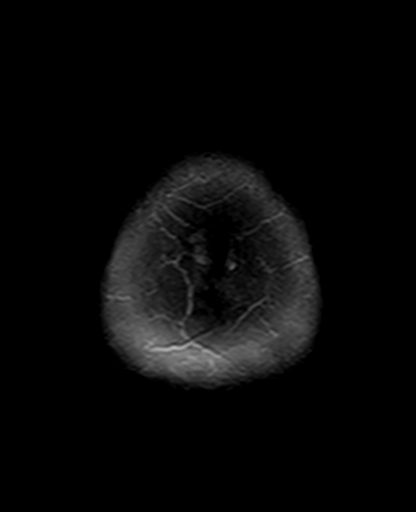

[Series 12: mag_images · axial · 3.0mm · 0.90mm/px · z∈[-76,+92]mm · 4 of 60 slices shown]
[im 1/60]
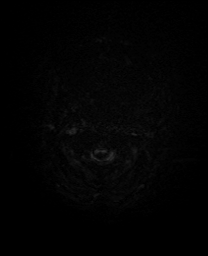
[im 20/60]
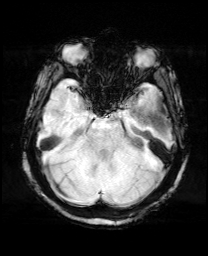
[im 40/60]
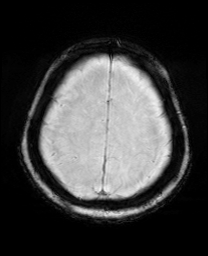
[im 60/60]
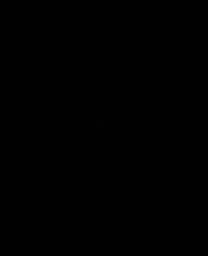

[Series 13: pha_images · axial · 3.0mm · 0.90mm/px · z∈[-76,+86]mm · 4 of 58 slices shown]
[im 1/58]
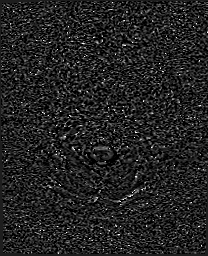
[im 20/58]
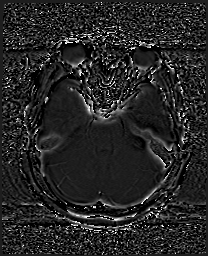
[im 39/58]
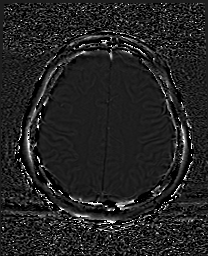
[im 58/58]
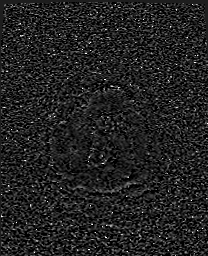

[Series 14: swi_images · axial · 3.0mm · 0.90mm/px · z∈[-76,+92]mm · 4 of 60 slices shown]
[im 1/60]
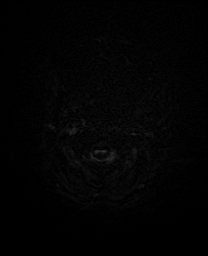
[im 20/60]
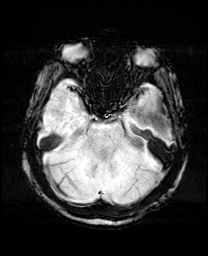
[im 40/60]
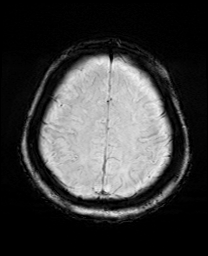
[im 60/60]
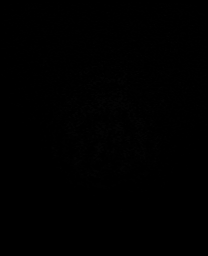

[Series 15: mip_images(sw) · axial · 24.0mm · 0.90mm/px · z∈[-66,+82]mm · 4 of 53 slices shown]
[im 1/53]
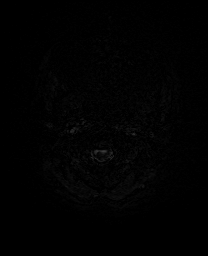
[im 18/53]
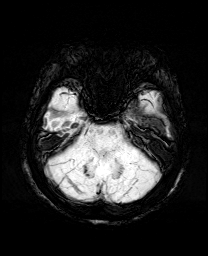
[im 35/53]
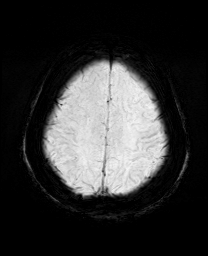
[im 53/53]
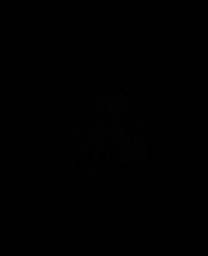

[Series 17: T2 post-contrast · coronal · 5.0mm · 0.72mm/px · 2 of 28 slices shown]
[im 1/28]
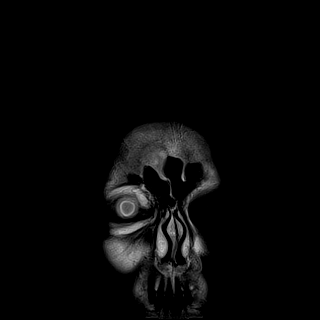
[im 28/28]
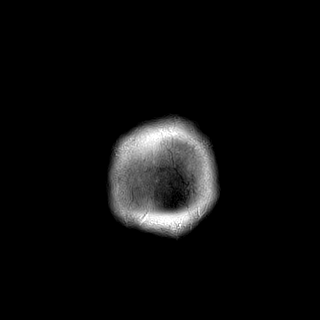

[Series 19: T1 post-contrast · coronal · 5.0mm · 0.34mm/px · 2 of 28 slices shown]
[im 1/28]
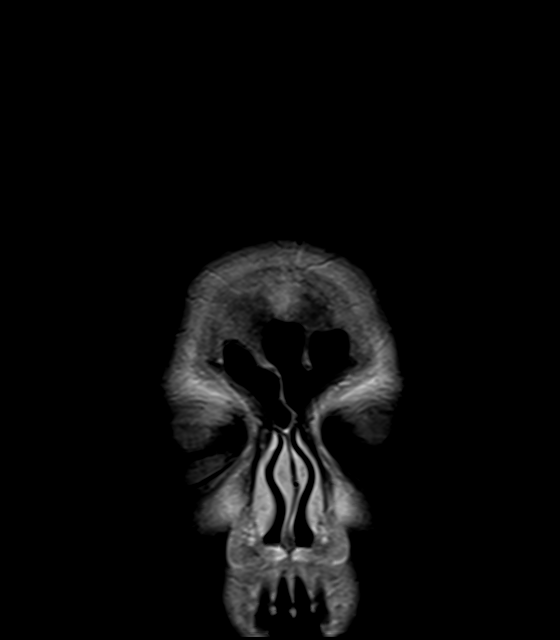
[im 28/28]
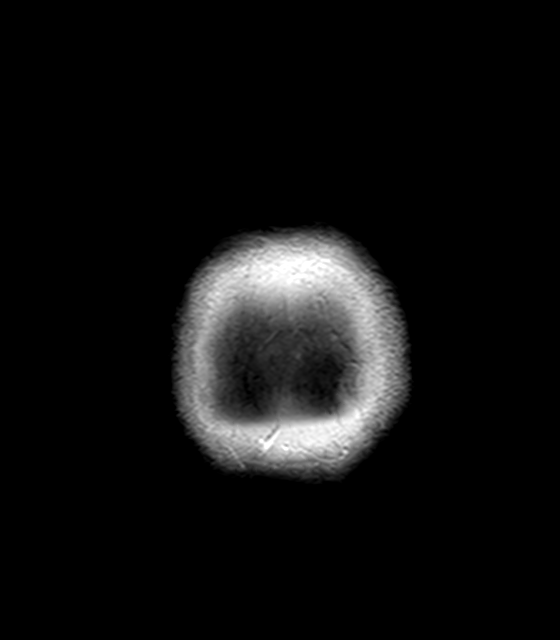

[40 of 48 positions shown; findings below may reference images not displayed]

FINDINGS: Brain: No acute infarction, hemorrhage, hydrocephalus, extra-axial
collection or mass lesion. Transsphenoidal resection of pituitary
adenoma chronic postsurgical changes without interval change. No
recurrent tumor identified. Stable punctate nonspecific T2 FLAIR
hyperintensities in subcortical and periventricular white matter as
well as the pons are compatible with mild chronic microvascular
ischemic changes. Stable mild volume loss of the brain. After
administration of intravenous contrast there is no abnormal
enhancement of the brain.

Vascular: Normal flow voids.

Skull and upper cervical spine: Normal marrow signal.

Sinuses/Orbits: Small left maxillary sinus mucous retention cyst.
Right-sided ethmoidectomy and resection of anterior wall of the
sphenoid sinuses. No abnormal signal of mastoid air cells. Orbits
are unremarkable.

Other: None.
IMPRESSION: 1. No acute intracranial process or abnormal enhancement of the
brain.
2. Stable mild chronic microvascular ischemic changes and volume
loss of the brain.
3. Stable chronic postsurgical changes related to resection of
pituitary adenoma.

## 2020-02-28 ENCOUNTER — Other Ambulatory Visit: Payer: Self-pay

## 2020-02-28 ENCOUNTER — Ambulatory Visit: Payer: Medicare HMO | Admitting: Endocrinology

## 2020-02-28 VITALS — BP 118/64 | HR 101 | Ht 63.0 in | Wt 154.2 lb

## 2020-02-28 DIAGNOSIS — D352 Benign neoplasm of pituitary gland: Secondary | ICD-10-CM

## 2020-02-28 LAB — URINALYSIS, ROUTINE W REFLEX MICROSCOPIC
Bilirubin Urine: NEGATIVE
Hgb urine dipstick: NEGATIVE
Ketones, ur: NEGATIVE
Leukocytes,Ua: NEGATIVE
Nitrite: NEGATIVE
RBC / HPF: NONE SEEN (ref 0–?)
Specific Gravity, Urine: <=1.005 — AB (ref 1.000–1.030)
Total Protein, Urine: NEGATIVE
Urine Glucose: NEGATIVE
Urobilinogen, UA: 0.2 (ref 0.0–1.0)
WBC, UA: NONE SEEN (ref 0–?)
pH: 6 (ref 5.0–8.0)

## 2020-02-28 LAB — BASIC METABOLIC PANEL
BUN: 14 mg/dL (ref 6–23)
CO2: 29 mEq/L (ref 19–32)
Calcium: 9.8 mg/dL (ref 8.4–10.5)
Chloride: 104 mEq/L (ref 96–112)
Creatinine, Ser: 1.16 mg/dL (ref 0.40–1.20)
GFR: 47.1 mL/min — ABNORMAL LOW (ref >60.00)
Glucose, Bld: 97 mg/dL (ref 70–99)
Potassium: 4.2 mEq/L (ref 3.5–5.1)
Sodium: 139 mEq/L (ref 135–145)

## 2020-02-28 LAB — CORTISOL: Cortisol, Plasma: 8.1 ug/dL

## 2020-02-28 LAB — FOLLICLE STIMULATING HORMONE: FSH: 25 m[IU]/mL

## 2020-02-28 LAB — LUTEINIZING HORMONE: LH: 5.44 m[IU]/mL

## 2020-02-28 MED ORDER — TRIAZOLAM 0.125 MG PO TABS: 0.1250 mg | ORAL_TABLET | Freq: Once | ORAL | 0 refills | Status: DC

## 2020-02-28 NOTE — Patient Instructions (Addendum)
Let's recheck the MRI.  you will receive a phone call, about a day and time for an appointment. Blood tests are requested for you today.  We'll let you know about the results.  I have sent a prescription to your pharmacy, for the pill prior to the MRI.  Please come back for a follow-up appointment in 1 year.

## 2020-02-28 NOTE — Progress Notes (Signed)
Subjective:    Patient ID: Nancy Herman, female    DOB: February 12, 1945, 75 y.o.   MRN: [id number]  HPI Pt returns for f/u of pituitary adenoma (apparently nonsecretory; was 1.8 mm diameter; dx'ed and resected in 2017 (in [place]); she did not have XRT; she took synthroid until early 2020; since then, she is on no pituitary rx; f/u MRI in 2020 showed no residual tumor).   She had these pit function results in early 2020:  FSH/LH: normal (not menopausal).   Prol: normal ACTH: stim test normal GH: IGF-1 was normal VP: USG=1.010, VP was undetectable; 24-HR urine was 2300 ml.   TSH: euthyroid off synthroid (since 2020) pt states she feels well in general.  Past Medical History:  Diagnosis Date  . Anxiety   . Frequent headaches   . GERD (gastroesophageal reflux disease)   . Hyperlipidemia   . IBS (irritable bowel syndrome)   . Pituitary insufficiency ([place])   . Thyroid disease     Past Surgical History:  Procedure Laterality Date  . ABDOMINAL HYSTERECTOMY  1990  . ANAL RECTAL MANOMETRY N/A 08/14/2018   Procedure: ANO RECTAL MANOMETRY;  Surgeon: Mauri Pole, MD;  Location: [facility];  Service: Endoscopy;  Laterality: N/A;  . BRAIN SURGERY    . BREAST EXCISIONAL BIOPSY Right unsure  . BREAST EXCISIONAL BIOPSY Left unsure  . OVARIAN CYST SURGERY  1994  . REPAIR RECTOCELE  2016   and prolapsed uterus.  . TONSILLECTOMY  1973    Social History   Socioeconomic History  . Marital status: Widowed    Spouse name: Not on file  . Number of children: 2  . Years of education: Not on file  . Highest education level: Not on file  Occupational History  . Occupation: retired  Tobacco Use  . Smoking status: Never Smoker  . Smokeless tobacco: Never Used  Vaping Use  . Vaping Use: Never used  Substance and Sexual Activity  . Alcohol use: No  . Drug use: No  . Sexual activity: Never  Other Topics Concern  . Not on file  Social History Narrative   Lives alone.  Lives near  son.  Was in [place].   Social Determinants of Health   Financial Resource Strain: Low Risk   . Difficulty of Paying Living Expenses: Not hard at all  Food Insecurity: No Food Insecurity  . Worried About Charity fundraiser in the Last Year: Never true  . Ran Out of Food in the Last Year: Never true  Transportation Needs: No Transportation Needs  . Lack of Transportation (Medical): No  . Lack of Transportation (Non-Medical): No  Physical Activity: Insufficiently Active  . Days of Exercise per Week: 3 days  . Minutes of Exercise per Session: 20 min  Stress: No Stress Concern Present  . Feeling of Stress : Only a little  Social Connections: Socially Isolated  . Frequency of Communication with Friends and Family: More than three times a week  . Frequency of Social Gatherings with Friends and Family: Never  . Attends Religious Services: Never  . Active Member of Clubs or Organizations: No  . Attends Archivist Meetings: Never  . Marital Status: Widowed  Intimate Partner Violence: Not At Risk  . Fear of Current or Ex-Partner: No  . Emotionally Abused: No  . Physically Abused: No  . Sexually Abused: No    Current Outpatient Medications on File Prior to Visit  Medication Sig Dispense Refill  .  carbamide peroxide (DEBROX) 6.5 % OTIC solution Place 5 drops into the left ear 2 (two) times daily. 15 mL 0  . cetirizine (ZYRTEC) 10 MG tablet Take 10 mg by mouth daily.    . famotidine (PEPCID) 20 MG tablet Take 1 tablet by mouth twice daily 90 tablet 0  . FLUoxetine (PROZAC) 20 MG capsule Take 1 capsule (20 mg total) by mouth daily. 30 capsule 5  . hydrocortisone (ANUSOL-HC) 25 MG suppository Place 1 suppository (25 mg total) rectally at bedtime. 12 suppository 3  . hydrOXYzine (VISTARIL) 25 MG capsule 1 qhs (Patient taking differently: Take 25 mg by mouth at bedtime. 1 qhs) 30 capsule 4  . montelukast (SINGULAIR) 10 MG tablet TAKE 1 TABLET BY MOUTH AT BEDTIME (Patient taking  differently: Take 10 mg by mouth at bedtime.) 90 tablet 2  . pantoprazole (PROTONIX) 40 MG tablet Take 1 tablet by mouth once daily (Patient taking differently: Take 40 mg by mouth daily.) 90 tablet 1  . polyethylene glycol (MIRALAX) packet Take 17 g by mouth daily. 14 each 0  . pravastatin (PRAVACHOL) 20 MG tablet Take 1 tablet by mouth once daily 90 tablet 3  . risperiDONE (RISPERDAL) 0.25 MG tablet 1 bid 60 tablet 5  . traZODone (DESYREL) 50 MG tablet Take 1 tablet (50 mg total) by mouth at bedtime. (Patient taking differently: Take 50 mg by mouth at bedtime as needed. Pt takes as needed) 30 tablet 5   No current facility-administered medications on file prior to visit.    Allergies  Allergen Reactions  . Other Other (See Comments)    Seasonal allergies    Family History  Problem Relation Age of Onset  . Heart disease Mother        No details   . Alcohol abuse Mother   . Drug abuse Mother   . Sickle cell anemia Mother   . Heart disease Father        No details . Questionable bypass or stent  . Drug abuse Father   . Alcohol abuse Father     BP 118/64 (BP Location: Right Arm, Patient Position: Sitting, Cuff Size: Normal)   Pulse (!) 101   Ht 5\' 3"  (1.6 m)   Wt 154 lb 3.2 oz (69.9 kg)   LMP  (LMP Unknown)   SpO2 96%   BMI 27.32 kg/m    Review of Systems Denies headache, visual loss, and polyuria    Objective:   Physical Exam VITAL SIGNS:  See vs page GENERAL: no distress GAIT: normal and steady.     Lab Results  Component Value Date   TSH 2.75 11/12/2019   Lab Results  Component Value Date   CREATININE 1.08 (H) 11/19/2019   BUN 11 11/19/2019   NA 142 11/19/2019   K 3.5 11/19/2019   CL 106 11/19/2019   CO2 27 11/19/2019      Assessment & Plan:  Pituitary adenoma.  We discussed f/u.  She requests to recheck MRI.  Anxiety: she is rx'ed halcion.  Pituitary insuff: check labs.  Patient Instructions  Let's recheck the MRI.  you will receive a phone call,  about a day and time for an appointment. Blood tests are requested for you today.  We'll let you know about the results.  I have sent a prescription to your pharmacy, for the pill prior to the MRI.  Please come back for a follow-up appointment in 1 year.

## 2020-03-01 ENCOUNTER — Telehealth: Payer: Self-pay | Admitting: *Deleted

## 2020-03-01 NOTE — Telephone Encounter (Signed)
Notified pt Triazolam 0.125 mg not cover by insurance. Pt will call pharmacy to get the cost or will call insurance for other alternatives and will call the office back.Nancy Herman

## 2020-03-02 LAB — PROLACTIN: Prolactin: 21.4 ng/mL

## 2020-03-02 LAB — ACTH: C206 ACTH: 20 pg/mL (ref 6–50)

## 2020-03-18 ENCOUNTER — Ambulatory Visit
Admission: RE | Admit: 2020-03-18 | Discharge: 2020-03-18 | Disposition: A | Discharge status: Home / Self Care | Payer: Medicare HMO | Source: Ambulatory Visit | Attending: Endocrinology | Admitting: Endocrinology

## 2020-03-18 ENCOUNTER — Other Ambulatory Visit: Payer: Self-pay

## 2020-03-18 DIAGNOSIS — D352 Benign neoplasm of pituitary gland: Secondary | ICD-10-CM

## 2020-03-18 MED ORDER — GADOBENATE DIMEGLUMINE 529 MG/ML IV SOLN
7.0000 mL | Freq: Once | INTRAVENOUS | Status: AC | PRN
  Administered 2020-03-18: 7 mL via INTRAVENOUS

## 2020-03-20 ENCOUNTER — Encounter: Payer: Self-pay | Admitting: Family Medicine

## 2020-03-20 ENCOUNTER — Telehealth (INDEPENDENT_AMBULATORY_CARE_PROVIDER_SITE_OTHER): Payer: Medicare HMO | Admitting: Family Medicine

## 2020-03-20 VITALS — Ht 63.0 in

## 2020-03-20 DIAGNOSIS — R5383 Other fatigue: Secondary | ICD-10-CM

## 2020-03-20 DIAGNOSIS — K219 Gastro-esophageal reflux disease without esophagitis: Secondary | ICD-10-CM | POA: Diagnosis not present

## 2020-03-20 DIAGNOSIS — J309 Allergic rhinitis, unspecified: Secondary | ICD-10-CM

## 2020-03-20 DIAGNOSIS — R059 Cough, unspecified: Secondary | ICD-10-CM

## 2020-03-20 MED ORDER — BENZONATATE 100 MG PO CAPS: 200.0000 mg | ORAL_CAPSULE | Freq: Two times a day (BID) | ORAL | 0 refills | Status: AC | PRN

## 2020-03-20 MED ORDER — IPRATROPIUM BROMIDE 0.06 % NA SOLN: 2.0000 | Freq: Four times a day (QID) | NASAL | 2 refills | Status: DC

## 2020-03-20 NOTE — Progress Notes (Signed)
Virtual Visit via Video Note I connected with Nancy Herman on 03/20/2020 by a video enabled telemedicine application and verified that I am speaking with the correct person using two identifiers.  Location patient: home Location provider:work office Persons participating in the virtual visit: patient, provider  I discussed the limitations of evaluation and management by telemedicine and the availability of in person appointments. The patient expressed understanding and agreed to proceed.  Chief Complaint  Patient presents with  . Fatigue    Covid test was negative  . Cough   HPI: Nancy Herman is a 75 year old female with history of anxiety, GERD, hyperlipidemia, and IBS complaining of almost 3 weeks of productive cough, clear sputum, denies hemoptysis. "Chest congestion" and "little" fatigue.  Nasal congestion, postnasal drainage, rhinorrhea. "Little" sore throat. Symptoms in the morning and at night. Cough is not interfering with sleep.  Recent COVID-19 test reported as negative. Negative for fever, chills,changes in appetite, anosmia,ageusia, unusual headache, CP, dyspnea, wheezing, or skin rash. + Body aches (mainly back pain) that is alleviated by moving around.  No known sick contact. No recent travel. Nancy Herman has not tried OTC medications.  Nancy Herman takes Singulair 10 mg daily and Zyrtec 10 mg daily as needed for allergies. Nancy Herman has had similar symptoms in prior years during seasonal changes.  + Heartburn, GERD symptoms exacerbated by postnasal drainage and cough. Currently Nancy Herman is on Protonix 40 mg daily and Pepcid 20 mg twice daily as needed.  Negative for abdominal pain, changes in bowel habits, nausea, vomiting, or urinary symptoms.  Sleeping about 8-9 hours, Nancy Herman also takes naps if needed. Nancy Herman has hx of fatigue. Depression stable. Nancy Herman follows with psychiatrist.  ROS: See pertinent positives and negatives per HPI.  Past Medical History:  Diagnosis Date  . Anxiety   .  Frequent headaches   . GERD (gastroesophageal reflux disease)   . Hyperlipidemia   . IBS (irritable bowel syndrome)   . Pituitary insufficiency ([place])   . Thyroid disease     Past Surgical History:  Procedure Laterality Date  . ABDOMINAL HYSTERECTOMY  1990  . ANAL RECTAL MANOMETRY N/A 08/14/2018   Procedure: ANO RECTAL MANOMETRY;  Surgeon: Mauri Pole, MD;  Location: [facility];  Service: Endoscopy;  Laterality: N/A;  . BRAIN SURGERY    . BREAST EXCISIONAL BIOPSY Right unsure  . BREAST EXCISIONAL BIOPSY Left unsure  . OVARIAN CYST SURGERY  1994  . REPAIR RECTOCELE  2016   and prolapsed uterus.  . TONSILLECTOMY  1973    Family History  Problem Relation Age of Onset  . Heart disease Mother        No details   . Alcohol abuse Mother   . Drug abuse Mother   . Sickle cell anemia Mother   . Heart disease Father        No details . Questionable bypass or stent  . Drug abuse Father   . Alcohol abuse Father     Social History   Socioeconomic History  . Marital status: Widowed    Spouse name: Not on file  . Number of children: 2  . Years of education: Not on file  . Highest education level: Not on file  Occupational History  . Occupation: retired  Tobacco Use  . Smoking status: Never Smoker  . Smokeless tobacco: Never Used  Vaping Use  . Vaping Use: Never used  Substance and Sexual Activity  . Alcohol use: No  . Drug use: No  . Sexual activity: Never  Other Topics Concern  . Not on file  Social History Narrative   Lives alone.  Lives near son.  Was in [place].   Social Determinants of Health   Financial Resource Strain: Low Risk   . Difficulty of Paying Living Expenses: Not hard at all  Food Insecurity: No Food Insecurity  . Worried About Charity fundraiser in the Last Year: Never true  . Ran Out of Food in the Last Year: Never true  Transportation Needs: No Transportation Needs  . Lack of Transportation (Medical): No  . Lack of Transportation  (Non-Medical): No  Physical Activity: Insufficiently Active  . Days of Exercise per Week: 3 days  . Minutes of Exercise per Session: 20 min  Stress: No Stress Concern Present  . Feeling of Stress : Only a little  Social Connections: Socially Isolated  . Frequency of Communication with Friends and Family: More than three times a week  . Frequency of Social Gatherings with Friends and Family: Never  . Attends Religious Services: Never  . Active Member of Clubs or Organizations: No  . Attends Archivist Meetings: Never  . Marital Status: Widowed  Intimate Partner Violence: Not At Risk  . Fear of Current or Ex-Partner: No  . Emotionally Abused: No  . Physically Abused: No  . Sexually Abused: No   Current Outpatient Medications:  .  carbamide peroxide (DEBROX) 6.5 % OTIC solution, Place 5 drops into the left ear 2 (two) times daily., Disp: 15 mL, Rfl: 0 .  cetirizine (ZYRTEC) 10 MG tablet, Take 10 mg by mouth daily., Disp: , Rfl:  .  famotidine (PEPCID) 20 MG tablet, Take 1 tablet by mouth twice daily, Disp: 90 tablet, Rfl: 0 .  FLUoxetine (PROZAC) 20 MG capsule, Take 1 capsule (20 mg total) by mouth daily., Disp: 30 capsule, Rfl: 5 .  hydrocortisone (ANUSOL-HC) 25 MG suppository, Place 1 suppository (25 mg total) rectally at bedtime., Disp: 12 suppository, Rfl: 3 .  hydrOXYzine (VISTARIL) 25 MG capsule, 1 qhs (Patient taking differently: Take 25 mg by mouth at bedtime. 1 qhs), Disp: 30 capsule, Rfl: 4 .  montelukast (SINGULAIR) 10 MG tablet, TAKE 1 TABLET BY MOUTH AT BEDTIME (Patient taking differently: Take 10 mg by mouth at bedtime.), Disp: 90 tablet, Rfl: 2 .  pantoprazole (PROTONIX) 40 MG tablet, Take 1 tablet by mouth once daily (Patient taking differently: Take 40 mg by mouth daily.), Disp: 90 tablet, Rfl: 1 .  polyethylene glycol (MIRALAX) packet, Take 17 g by mouth daily., Disp: 14 each, Rfl: 0 .  pravastatin (PRAVACHOL) 20 MG tablet, Take 1 tablet by mouth once daily,  Disp: 90 tablet, Rfl: 3 .  risperiDONE (RISPERDAL) 0.25 MG tablet, 1 bid, Disp: 60 tablet, Rfl: 5 .  traZODone (DESYREL) 50 MG tablet, Take 1 tablet (50 mg total) by mouth at bedtime. (Patient taking differently: Take 50 mg by mouth at bedtime as needed. Pt takes as needed), Disp: 30 tablet, Rfl: 5 .  triazolam (HALCION) 0.125 MG tablet, Take 1 tablet (0.125 mg total) by mouth once for 1 dose. Take prior to MRI, Disp: 1 tablet, Rfl: 0  EXAM:  VITALS per patient if applicable:Ht 5\' 3"  (1.6 m)   LMP  (LMP Unknown)   BMI 27.32 kg/m   GENERAL: alert, oriented, appears well and in no acute distress  HEENT: atraumatic, conjunctiva clear, no obvious abnormalities on inspection of external nose and ears  NECK: normal movements of the head and neck  LUNGS: on  inspection no signs of respiratory distress, breathing rate appears normal, no obvious gross SOB, gasping or wheezing. No cough during visit.  CV: no obvious cyanosis  MS: moves all visible extremities without noticeable abnormality  PSYCH/NEURO: pleasant and cooperative, no obvious depression, + anxious. Speech and thought processing grossly intact  ASSESSMENT AND PLAN:  Discussed the following assessment and plan: Orders Placed This Encounter  Procedures  . DG Chest 2 View    Allergic rhinitis, unspecified seasonality, unspecified trigger - Plan: ipratropium (ATROVENT) 0.06 % nasal spray Symptoms Nancy Herman reported today suggest exacerbation of allergy rhinitis. We discussed differential diagnosis, including a mild viral URI. Instructed to monitor for new symptoms, including fever. Symptomatic treatment with Atrovent nasal spray. No changes in Singulair or Zyrtec.  Cough - Plan: benzonatate (TESSALON) 100 MG capsule, DG Chest 2 View We discussed possible etiologies. Nancy Herman is concerned about possible pneumonia, so Nancy Herman certainly will be arranged. Plain Mucinex may help. Benzonatate for symptomatic treatment.  Gastroesophageal  reflux disease, unspecified whether esophagitis present Could be contributing to cough, respiratory symptoms aggravating heartburn. Continue Protonix 40 mg daily and Pepcid 20 mg twice daily. Hopefully when cough improves GERD symptoms will do as well. Continue GERD precautions. Nancy Herman follows with GI.  Fatigue, unspecified type Chronic problem. Can be aggravated by acute symptoms (allergies vs URI) but other chronic problems can also be contributor factors. Continue monitoring for new symptoms. For now I do not think blood work is necessary.  We discussed possible serious and likely etiologies, options for evaluation and workup, limitations of telemedicine visit vs in person visit, treatment, treatment risks and precautions.  I discussed the assessment and treatment plan with the patient.Nancy Herman was provided an opportunity to ask questions and all were answered. Nancy Herman agreed with the plan and demonstrated an understanding of the instructions.  Return if symptoms worsen or fail to improve, for CXR tomorrow..   Priscilla Finklea Martinique, MD

## 2020-03-21 ENCOUNTER — Other Ambulatory Visit: Payer: Medicare HMO

## 2020-03-21 ENCOUNTER — Other Ambulatory Visit: Payer: Self-pay

## 2020-03-21 ENCOUNTER — Ambulatory Visit (INDEPENDENT_AMBULATORY_CARE_PROVIDER_SITE_OTHER): Payer: Medicare HMO

## 2020-03-21 DIAGNOSIS — R059 Cough, unspecified: Secondary | ICD-10-CM

## 2020-03-27 ENCOUNTER — Other Ambulatory Visit: Payer: Self-pay | Admitting: Family Medicine

## 2020-03-27 DIAGNOSIS — K219 Gastro-esophageal reflux disease without esophagitis: Secondary | ICD-10-CM

## 2020-04-11 ENCOUNTER — Other Ambulatory Visit: Payer: Self-pay

## 2020-04-11 ENCOUNTER — Ambulatory Visit (INDEPENDENT_AMBULATORY_CARE_PROVIDER_SITE_OTHER): Payer: Medicare HMO | Admitting: Psychiatry

## 2020-04-11 ENCOUNTER — Encounter (HOSPITAL_COMMUNITY): Payer: Self-pay | Admitting: Psychiatry

## 2020-04-11 VITALS — BP 108/64 | HR 69 | Ht 63.0 in | Wt 153.0 lb

## 2020-04-11 DIAGNOSIS — F329 Major depressive disorder, single episode, unspecified: Secondary | ICD-10-CM

## 2020-04-11 DIAGNOSIS — F3342 Major depressive disorder, recurrent, in full remission: Secondary | ICD-10-CM

## 2020-04-11 DIAGNOSIS — F32A Depression, unspecified: Secondary | ICD-10-CM

## 2020-04-11 DIAGNOSIS — F411 Generalized anxiety disorder: Secondary | ICD-10-CM | POA: Diagnosis not present

## 2020-04-11 MED ORDER — RISPERIDONE 0.25 MG PO TABS: ORAL_TABLET | ORAL | 5 refills | Status: DC

## 2020-04-11 MED ORDER — FLUOXETINE HCL 20 MG PO CAPS: 20.0000 mg | ORAL_CAPSULE | Freq: Every day | ORAL | 5 refills | Status: DC

## 2020-04-11 NOTE — Progress Notes (Signed)
Psychiatric Initial Adult Assessment   Patient Identification: Nancy Herman MRN:  [id number] Date of Evaluation:  04/11/2020 Referral Source: Dr. Betty Martinique Chief Complaint:   Chief Complaint    Medication Management     Visit Diagnosis:    ICD-10-CM   1. Depressive disorder  F32.9 FLUoxetine (PROZAC) 20 MG capsule  2. Generalized anxiety disorder  F41.1 FLUoxetine (PROZAC) 20 MG capsule    History of Present Illness:   Today the patient is doing very well.  She denies being depressed.  She is sleeping and eating well and has good energy.  She has no problems thinking and concentrating.  Her health is very good.  She is under the medical care of Dr. Betty Martinique.  The patient denies anything resembling paranoia at all.  She has no hallucinations at all.  She said the past we attempted to reduce her Risperdal problems in the attic came back.  Therefore we went back to taking Risperdal 0.25 mg twice daily.  Today the patient had an aims scale that showed no evidence of tardive dyskinesia.  The patient is enjoying life.  She functions very well.  She does her own medications and does her own bills.  She still drives she goes to the grocery store she buys food she cooks her on her own.  Her son makes contact with her on a daily basis.  Financially she is stable.  Patient lives in a town home.  She has 4 or 5 good friends but no ribbons in her life.  She used to exercise on the bike and she will try to get back to it.  Overall she denies chest pain shortness of breath or any neurological symptoms at all.  She is functioning extremely well.  The patient is handling the pandemic very well.  She is had her vaccines.   Depression Symptoms:  difficulty concentrating, (Hypo) Manic Symptoms:   Anxiety Symptoms:   Psychotic Symptoms:   PTSD Symptoms:   Past Psychiatric History: Prozac for many years  Previous Psychotropic Medications:   Substance Abuse History in the last 12 months:   No.  Consequences of Substance Abuse:   Past Medical History:  Past Medical History:  Diagnosis Date  . Anxiety   . Frequent headaches   . GERD (gastroesophageal reflux disease)   . Hyperlipidemia   . IBS (irritable bowel syndrome)   . Pituitary insufficiency ([place])   . Thyroid disease     Past Surgical History:  Procedure Laterality Date  . ABDOMINAL HYSTERECTOMY  1990  . ANAL RECTAL MANOMETRY N/A 08/14/2018   Procedure: ANO RECTAL MANOMETRY;  Surgeon: Mauri Pole, MD;  Location: [facility];  Service: Endoscopy;  Laterality: N/A;  . BRAIN SURGERY    . BREAST EXCISIONAL BIOPSY Right unsure  . BREAST EXCISIONAL BIOPSY Left unsure  . OVARIAN CYST SURGERY  1994  . REPAIR RECTOCELE  2016   and prolapsed uterus.  . TONSILLECTOMY  1973    Family Psychiatric History:   Family History:  Family History  Problem Relation Age of Onset  . Heart disease Mother        No details   . Alcohol abuse Mother   . Drug abuse Mother   . Sickle cell anemia Mother   . Heart disease Father        No details . Questionable bypass or stent  . Drug abuse Father   . Alcohol abuse Father     Social History:   Social  History   Socioeconomic History  . Marital status: Widowed    Spouse name: Not on file  . Number of children: 2  . Years of education: Not on file  . Highest education level: Not on file  Occupational History  . Occupation: retired  Tobacco Use  . Smoking status: Never Smoker  . Smokeless tobacco: Never Used  Vaping Use  . Vaping Use: Never used  Substance and Sexual Activity  . Alcohol use: No  . Drug use: No  . Sexual activity: Never  Other Topics Concern  . Not on file  Social History Narrative   Lives alone.  Lives near son.  Was in [place].   Social Determinants of Health   Financial Resource Strain: Low Risk   . Difficulty of Paying Living Expenses: Not hard at all  Food Insecurity: No Food Insecurity  . Worried About Charity fundraiser in the Last  Year: Never true  . Ran Out of Food in the Last Year: Never true  Transportation Needs: No Transportation Needs  . Lack of Transportation (Medical): No  . Lack of Transportation (Non-Medical): No  Physical Activity: Insufficiently Active  . Days of Exercise per Week: 3 days  . Minutes of Exercise per Session: 20 min  Stress: No Stress Concern Present  . Feeling of Stress : Only a little  Social Connections: Socially Isolated  . Frequency of Communication with Friends and Family: More than three times a week  . Frequency of Social Gatherings with Friends and Family: Never  . Attends Religious Services: Never  . Active Member of Clubs or Organizations: No  . Attends Archivist Meetings: Never  . Marital Status: Widowed    Additional Social History:   Allergies:   Allergies  Allergen Reactions  . Other Other (See Comments)    Seasonal allergies    Metabolic Disorder Labs: Lab Results  Component Value Date   HGBA1C 5.9 11/09/2018   Lab Results  Component Value Date   PROLACTIN 21.4 02/28/2020   PROLACTIN 14.6 03/05/2019   Lab Results  Component Value Date   CHOL 222 (H) 11/12/2019   TRIG 64 11/12/2019   HDL 95 11/12/2019   CHOLHDL 2.3 11/12/2019   VLDL 17.0 11/09/2018   LDLCALC 112 (H) 11/12/2019   LDLCALC 116 (H) 11/09/2018   Lab Results  Component Value Date   TSH 2.75 11/12/2019    Therapeutic Level Labs: No results found for: LITHIUM No results found for: CBMZ No results found for: VALPROATE  Current Medications: Current Outpatient Medications  Medication Sig Dispense Refill  . carbamide peroxide (DEBROX) 6.5 % OTIC solution Place 5 drops into the left ear 2 (two) times daily. 15 mL 0  . cetirizine (ZYRTEC) 10 MG tablet Take 10 mg by mouth daily.    . famotidine (PEPCID) 20 MG tablet Take 1 tablet by mouth twice daily 90 tablet 0  . hydrocortisone (ANUSOL-HC) 25 MG suppository Place 1 suppository (25 mg total) rectally at bedtime. 12  suppository 3  . ipratropium (ATROVENT) 0.06 % nasal spray Place 2 sprays into both nostrils 4 (four) times daily. 15 mL 2  . montelukast (SINGULAIR) 10 MG tablet TAKE 1 TABLET BY MOUTH AT BEDTIME (Patient taking differently: Take 10 mg by mouth at bedtime.) 90 tablet 2  . pantoprazole (PROTONIX) 40 MG tablet Take 1 tablet by mouth once daily (Patient taking differently: Take 40 mg by mouth daily.) 90 tablet 1  . polyethylene glycol (MIRALAX) packet Take 17  g by mouth daily. 14 each 0  . pravastatin (PRAVACHOL) 20 MG tablet Take 1 tablet by mouth once daily 90 tablet 3  . FLUoxetine (PROZAC) 20 MG capsule Take 1 capsule (20 mg total) by mouth daily. 30 capsule 5  . risperiDONE (RISPERDAL) 0.25 MG tablet 1 bid 60 tablet 5  . triazolam (HALCION) 0.125 MG tablet Take 1 tablet (0.125 mg total) by mouth once for 1 dose. Take prior to MRI 1 tablet 0   No current facility-administered medications for this visit.    Musculoskeletal: Strength & Muscle Tone: within normal limits Gait & Station: normal Patient leans:   Psychiatric Specialty Exam: ROS  Blood pressure 108/64, pulse 69, height 5\' 3"  (1.6 m), weight 153 lb (69.4 kg), SpO2 97 %.Body mass index is 27.1 kg/m.  General Appearance: Casual  Eye Contact:  Good  Speech: Good  Volume:  Normal  Mood:  NA  Affect:  NA and Appropriate  Thought Process:  Coherent  Orientation:  Full (Time, Place, and Person)  Thought Content:  Logical  Suicidal Thoughts:  No  Homicidal Thoughts:  No  Memory:  Negative  Judgement:  Good  Insight:  Fair  Psychomotor Activity:  Normal  Concentration:    Recall:  Good  Fund of Knowledge:Good  Language: Good  Akathisia:  No  Handed:    AIMS (if indicated):  not done  Assets:  Desire for Improvement  ADL's:  Intact  Cognition: WNL  Sleep:  Good   Screenings: PHQ2-9   Flowsheet Row Office Visit from 04/11/2020 in [facility] Clinical Support from 11/10/2019 in  [facility] from 11/09/2018 in [facility] from 12/03/2017 in [facility] from 10/28/2017 in [facility]  PHQ-2 Total Score 0 1 0 0 0    [facility] from 03/16/2018 in [facility] from 02/22/2018 in [facility]  C-SSRS RISK CATEGORY No Risk No Risk      Assessment and Plan:  3/8/20221:41 PM   This patient has 1 problem.  She is had a history of major depression with psychosis.  At this time she will continue taking the Prozac as prescribed.  She also continue low-dose Risperdal 0.25 mg twice daily.  I believe we will continue this medication for the next 6 months when she returns consider possibly reducing the Risperdal to just taking it at night.  She shows no evidence of psychosis.  She is free of all her psychotic symptomatology.

## 2020-04-25 ENCOUNTER — Other Ambulatory Visit: Payer: Self-pay | Admitting: Family Medicine

## 2020-04-25 DIAGNOSIS — K219 Gastro-esophageal reflux disease without esophagitis: Secondary | ICD-10-CM

## 2020-05-08 ENCOUNTER — Other Ambulatory Visit: Payer: Self-pay | Admitting: Family Medicine

## 2020-05-08 DIAGNOSIS — K219 Gastro-esophageal reflux disease without esophagitis: Secondary | ICD-10-CM

## 2020-05-09 ENCOUNTER — Ambulatory Visit (INDEPENDENT_AMBULATORY_CARE_PROVIDER_SITE_OTHER): Payer: Medicare HMO | Admitting: Family Medicine

## 2020-05-09 ENCOUNTER — Encounter: Payer: Self-pay | Admitting: Family Medicine

## 2020-05-09 ENCOUNTER — Other Ambulatory Visit: Payer: Self-pay

## 2020-05-09 VITALS — BP 118/70 | HR 81 | Resp 12 | Ht 63.0 in | Wt 152.4 lb

## 2020-05-09 DIAGNOSIS — K219 Gastro-esophageal reflux disease without esophagitis: Secondary | ICD-10-CM

## 2020-05-09 DIAGNOSIS — R7303 Prediabetes: Secondary | ICD-10-CM | POA: Insufficient documentation

## 2020-05-09 DIAGNOSIS — E663 Overweight: Secondary | ICD-10-CM | POA: Diagnosis not present

## 2020-05-09 DIAGNOSIS — J309 Allergic rhinitis, unspecified: Secondary | ICD-10-CM

## 2020-05-09 DIAGNOSIS — R0789 Other chest pain: Secondary | ICD-10-CM

## 2020-05-09 LAB — POCT GLYCOSYLATED HEMOGLOBIN (HGB A1C): Hemoglobin A1C: 5.6 % (ref 4.0–5.6)

## 2020-05-09 MED ORDER — IPRATROPIUM BROMIDE 0.06 % NA SOLN: 2.0000 | Freq: Four times a day (QID) | NASAL | 6 refills | Status: DC

## 2020-05-09 NOTE — Progress Notes (Signed)
HPI: Nancy Herman is a 75 y.o. female, who is here today for follow up.  Since her last visit she has seen her psychiatrist.  She was last seen on 03/20/20, when she was complaining about productive cough and congestion. Symptoms have improved, she is "feeling better." She is no longer coughing, this problem has happened intermittently for a while. Atrovent nasal spray has helped with rhinorrhea and nasal congestion. Negative for sore throat or stridor.  Occasional mid chest discomfort when moving certain ways or when swallowing some of her pills. She feels like sometimes medications get stuck in her chest, not as bad as it was. It is alleviated by drinking water with her medications.  Denies CP with exertion,palpitations,SOB,or diaphoresis. In 04/2015 Lexiscan stress test was negative for ischemia, normal LVEF. Evaluated by cardiologist, chest discomfort was not thought to be cardiac. On 12/03/19 negative exercise tolerance test.  She is not longer having heartburn. Sometimes feels "mucus" in her through, mainly at night when she is in bed.  She is on Protonix 40 mg daily and Pepcid 20 mg twice daily. She has not seen her GI since 07/2019. Last EGD in 08/2016 done because dysphagia:Normal appearance/esophagus,GEJ, stomach,and duodenum.Small hiatal hernia  She is also concerned about weight gain, which she attributes to some of her medications. She recently started doing the stationary bike daily and trying to watch what she eats. She feels like she is craving for sweets and starches. "Hungry all the time."  Prediabetes: Negative for polydipsia and polyuria.  Lab Results  Component Value Date   HGBA1C 5.9 11/09/2018   Review of Systems  Constitutional: Negative for activity change, appetite change and fever.  Eyes: Negative for redness and visual disturbance.  Gastrointestinal: Negative for abdominal pain, nausea and vomiting.       Negative for changes in bowel  habits.  Genitourinary: Negative for decreased urine volume, dysuria and hematuria.  Neurological: Negative for syncope, weakness and headaches.  Psychiatric/Behavioral: The patient is nervous/anxious.   Rest of ROS, see pertinent positives sand negatives in HPI  Current Outpatient Medications on File Prior to Visit  Medication Sig Dispense Refill  . carbamide peroxide (DEBROX) 6.5 % OTIC solution Place 5 drops into the left ear 2 (two) times daily. 15 mL 0  . cetirizine (ZYRTEC) 10 MG tablet Take 10 mg by mouth daily.    . famotidine (PEPCID) 20 MG tablet Take 1 tablet by mouth twice daily 180 tablet 3  . FLUoxetine (PROZAC) 20 MG capsule Take 1 capsule (20 mg total) by mouth daily. 30 capsule 5  . hydrocortisone (ANUSOL-HC) 25 MG suppository Place 1 suppository (25 mg total) rectally at bedtime. 12 suppository 3  . montelukast (SINGULAIR) 10 MG tablet TAKE 1 TABLET BY MOUTH AT BEDTIME (Patient taking differently: Take 10 mg by mouth at bedtime.) 90 tablet 2  . pantoprazole (PROTONIX) 40 MG tablet Take 1 tablet by mouth once daily 90 tablet 3  . polyethylene glycol (MIRALAX) packet Take 17 g by mouth daily. 14 each 0  . pravastatin (PRAVACHOL) 20 MG tablet Take 1 tablet by mouth once daily 90 tablet 3  . risperiDONE (RISPERDAL) 0.25 MG tablet 1 bid 60 tablet 5  . TURMERIC PO Take by mouth.    . triazolam (HALCION) 0.125 MG tablet Take 1 tablet (0.125 mg total) by mouth once for 1 dose. Take prior to MRI 1 tablet 0   No current facility-administered medications on file prior to visit.  Past Medical History:  Diagnosis Date  . Anxiety   . Frequent headaches   . GERD (gastroesophageal reflux disease)   . Hyperlipidemia   . IBS (irritable bowel syndrome)   . 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 Thyroid disease    Allergies  Allergen Reactions  . Other Other (See Comments)    Seasonal allergies    Social History   Socioeconomic History  . Marital status: Widowed    Spouse  name: Not on file  . Number of children: 2  . Years of education: Not on file  . Highest education level: Not on file  Occupational History  . Occupation: retired  Tobacco Use  . Smoking status: Never Smoker  . Smokeless tobacco: Never Used  Vaping Use  . Vaping Use: Never used  Substance and Sexual Activity  . Alcohol use: No  . Drug use: No  . Sexual activity: Never  Other Topics Concern  . Not on file  Social History Narrative   Lives alone.  Lives near son.  Was in [place].   Social Determinants of Health   Financial Resource Strain: Low Risk   . Difficulty of Paying Living Expenses: Not hard at all  Food Insecurity: No Food Insecurity  . Worried About Charity fundraiser in the Last Year: Never true  . Ran Out of Food in the Last Year: Never true  Transportation Needs: No Transportation Needs  . Lack of Transportation (Medical): No  . Lack of Transportation (Non-Medical): No  Physical Activity: Insufficiently Active  . Days of Exercise per Week: 3 days  . Minutes of Exercise per Session: 20 min  Stress: No Stress Concern Present  . Feeling of Stress : Only a little  Social Connections: Socially Isolated  . Frequency of Communication with Friends and Family: More than three times a week  . Frequency of Social Gatherings with Friends and Family: Never  . Attends Religious Services: Never  . Active Member of Clubs or Organizations: No  . Attends Archivist Meetings: Never  . Marital Status: Widowed   Vitals:   05/09/20 0921  BP: 118/70  Pulse: 81  Resp: 12  SpO2: 98%   Body mass index is 26.99 kg/m.  Physical Exam Vitals and nursing note reviewed.  Constitutional:      General: She is not in acute distress.    Appearance: She is well-developed.  HENT:     Head: Normocephalic and atraumatic.  Eyes:     Conjunctiva/sclera: Conjunctivae normal.     Pupils: Pupils are equal, round, and reactive to light.  Cardiovascular:     Rate and Rhythm: Normal  rate and regular rhythm.     Pulses:          Dorsalis pedis pulses are 2+ on the right side and 2+ on the left side.     Heart sounds: No murmur heard.   Pulmonary:     Effort: Pulmonary effort is normal. No respiratory distress.     Breath sounds: Normal breath sounds.  Abdominal:     Palpations: Abdomen is soft. There is no hepatomegaly or mass.     Tenderness: There is no abdominal tenderness.  Lymphadenopathy:     Cervical: No cervical adenopathy.  Skin:    General: Skin is warm.     Findings: No erythema or rash.  Neurological:     Mental Status: She is alert and oriented to person, place, and time.     Cranial Nerves: No  cranial nerve deficit.     Gait: Gait normal.  Psychiatric:        Mood and Affect: Mood is anxious.     Comments: Well groomed, good eye contact.   ASSESSMENT AND PLAN:  Ms. KIFFANY SCHELLING was seen today for follow-up.  Orders Placed This Encounter  Procedures  . POC HgB A1c   Lab Results  Component Value Date   HGBA1C 5.6 05/09/2020    Allergic rhinitis Problem is better controlled. Continue Zyrtec 10 mg daily, Singulair 10 mg at bedtime,and Atroven nasal spray.  GERD (gastroesophageal reflux disease) Improved. Continue same dose of Protonix and Pepcid. GERD precautions also recommended.  Prediabetes HgA1C improved. We discussed Dx criteria for diabetes. A healthy life style encouraged for primary prevention.  Chest discomfort Stable for years. Cardiac work up has been negative otherwise. Some of her chronic medical problem can be contributing factors (anxiety and GERD/dyspepsia). Instructed about warning signs.  Overweight (BMI 25.0-29.9) Gained about 5 Lb since 11/2019. We discussed benefits of wt loss. Consistency with healthy diet and physical activity recommended.   Return in about 6 months (around 11/08/2020) for CPE.  Landrey Mahurin G. Martinique, MD  [facility]. [place] office.  A few things to remember from  today's visit:   Prediabetes - Plan: POC HgB A1c  Gastroesophageal reflux disease, unspecified whether esophagitis present  Allergic rhinitis, unspecified seasonality, unspecified trigger  If you need refills please call your pharmacy. Do not use My Chart to request refills or for acute issues that need immediate attention. Continue current medications. Diabetes test done today was normal.  Please be sure medication list is accurate. If a new problem present, please set up appointment sooner than planned today.

## 2020-05-09 NOTE — Patient Instructions (Addendum)
A few things to remember from today's visit:   Prediabetes - Plan: POC HgB A1c  Gastroesophageal reflux disease, unspecified whether esophagitis present  Allergic rhinitis, unspecified seasonality, unspecified trigger  If you need refills please call your pharmacy. Do not use My Chart to request refills or for acute issues that need immediate attention. Continue current medications. Diabetes test done today was normal.  Please be sure medication list is accurate. If a new problem present, please set up appointment sooner than planned today.

## 2020-05-09 NOTE — Assessment & Plan Note (Signed)
Stable for years. Cardiac work up has been negative otherwise. Some of her chronic medical problem can be contributing factors (anxiety and GERD/dyspepsia). Instructed about warning signs.

## 2020-05-09 NOTE — Assessment & Plan Note (Signed)
HgA1C improved. We discussed Dx criteria for diabetes. A healthy life style encouraged for primary prevention.

## 2020-05-09 NOTE — Assessment & Plan Note (Signed)
Problem is better controlled. Continue Zyrtec 10 mg daily, Singulair 10 mg at bedtime,and Atroven nasal spray.

## 2020-05-09 NOTE — Assessment & Plan Note (Signed)
Improved. Continue same dose of Protonix and Pepcid. GERD precautions also recommended.

## 2020-07-04 ENCOUNTER — Other Ambulatory Visit: Payer: Self-pay | Admitting: Family Medicine

## 2020-07-04 DIAGNOSIS — J309 Allergic rhinitis, unspecified: Secondary | ICD-10-CM

## 2020-07-06 ENCOUNTER — Other Ambulatory Visit: Payer: Self-pay | Admitting: Family Medicine

## 2020-07-06 DIAGNOSIS — Z1231 Encounter for screening mammogram for malignant neoplasm of breast: Secondary | ICD-10-CM

## 2020-09-05 ENCOUNTER — Ambulatory Visit (INDEPENDENT_AMBULATORY_CARE_PROVIDER_SITE_OTHER): Payer: Medicare HMO | Admitting: Psychiatry

## 2020-09-05 ENCOUNTER — Other Ambulatory Visit: Payer: Self-pay

## 2020-09-05 DIAGNOSIS — F411 Generalized anxiety disorder: Secondary | ICD-10-CM

## 2020-09-05 DIAGNOSIS — F32A Depression, unspecified: Secondary | ICD-10-CM

## 2020-09-05 DIAGNOSIS — F325 Major depressive disorder, single episode, in full remission: Secondary | ICD-10-CM

## 2020-09-05 DIAGNOSIS — F329 Major depressive disorder, single episode, unspecified: Secondary | ICD-10-CM | POA: Diagnosis not present

## 2020-09-05 MED ORDER — RISPERIDONE 0.25 MG PO TABS: ORAL_TABLET | ORAL | 5 refills | Status: DC

## 2020-09-05 MED ORDER — FLUOXETINE HCL 20 MG PO CAPS: 20.0000 mg | ORAL_CAPSULE | Freq: Every day | ORAL | 5 refills | Status: DC

## 2020-09-05 NOTE — Progress Notes (Signed)
Psychiatric Initial Adult Assessment   Patient Identification: Nancy Herman MRN:  [id number] Date of Evaluation:  09/05/2020 Referral Source: Dr. Betty Martinique Chief Complaint:     Visit Diagnosis:    ICD-10-CM   1. Depressive disorder  F32.9 FLUoxetine (PROZAC) 20 MG capsule    2. Generalized anxiety disorder  F41.1 FLUoxetine (PROZAC) 20 MG capsule      History of Present Illness:  Today the patient is doing very well.  She had a friend bring her today because her car is having problems.  She is seen face-to-face.  Her car to get some problems with the transmission should take it to the dealer tomorrow.  The patient is also got to get her air conditioning system repair.  Other than these problems the patient seems to be doing pretty well.  There were very few other stressors.  She did just learned that her daughter who lives in [place] is going to be moving to [place].  The patient's son lives here in [place] and she has contact with him regularly.  The patient has has 5 grandchildren all of whom are doing well.  All her kids are doing well.  The patient is talking reasonably good.  She is being treated for hypercholesterolemia, GERD and irritable bowel syndrome.  Financially she is very stable.  The patient exercises on her bike.  Patient has no evidence of psychosis.  Her mood is stable.  She denies anhedonia.  She shows no vegetative symptoms at all.  Patient lives independently.  She does all her bills and medications without problem.  She continues to drive.  She goes to Bible study regularly.  She is got a dog is doing well and she takes care of her.  The patient likes to read and is very active.  Patient is not suicidal.  She is functioning well.   Depression Symptoms:  difficulty concentrating, (Hypo) Manic Symptoms:   Anxiety Symptoms:   Psychotic Symptoms:   PTSD Symptoms:   Past Psychiatric History: Prozac for many years  Previous Psychotropic Medications:    Substance Abuse History in the last 12 months:  No.  Consequences of Substance Abuse:   Past Medical History:  Past Medical History:  Diagnosis Date   Anxiety    Frequent headaches    GERD (gastroesophageal reflux disease)    Hyperlipidemia    IBS (irritable bowel syndrome)    Pituitary insufficiency ([facility])    Thyroid disease     Past Surgical History:  Procedure Laterality Date   ABDOMINAL HYSTERECTOMY  1990   ANAL RECTAL MANOMETRY N/A 08/14/2018   Procedure: ANO RECTAL MANOMETRY;  Surgeon: Mauri Pole, MD;  Location: [facility];  Service: Endoscopy;  Laterality: N/A;   BRAIN SURGERY     BREAST EXCISIONAL BIOPSY Right unsure   BREAST EXCISIONAL BIOPSY Left unsure   OVARIAN CYST SURGERY  1994   REPAIR RECTOCELE  2016   and prolapsed uterus.   TONSILLECTOMY  1973    Family Psychiatric History:   Family History:  Family History  Problem Relation Age of Onset   Heart disease Mother        No details    Alcohol abuse Mother    Drug abuse Mother    Sickle cell anemia Mother    Heart disease Father        No details . Questionable bypass or stent   Drug abuse Father    Alcohol abuse Father     Social History:  Social History   Socioeconomic History   Marital status: Widowed    Spouse name: Not on file   Number of children: 2   Years of education: Not on file   Highest education level: Not on file  Occupational History   Occupation: retired  Tobacco Use   Smoking status: Never   Smokeless tobacco: Never  Vaping Use   Vaping Use: Never used  Substance and Sexual Activity   Alcohol use: No   Drug use: No   Sexual activity: Never  Other Topics Concern   Not on file  Social History Narrative   Lives alone.  Lives near son.  Was in [place].   Social Determinants of Health   Financial Resource Strain: Low Risk    Difficulty of Paying Living Expenses: Not hard at all  Food Insecurity: No Food Insecurity   Worried About Charity fundraiser in the  Last Year: Never true   [place] in the Last Year: Never true  Transportation Needs: No Transportation Needs   Lack of Transportation (Medical): No   Lack of Transportation (Non-Medical): No  Physical Activity: Insufficiently Active   Days of Exercise per Week: 3 days   Minutes of Exercise per Session: 20 min  Stress: No Stress Concern Present   Feeling of Stress : Only a little  Social Connections: Socially Isolated   Frequency of Communication with Friends and Family: More than three times a week   Frequency of Social Gatherings with Friends and Family: Never   Attends Religious Services: Never   Marine scientist or Organizations: No   Attends Archivist Meetings: Never   Marital Status: Widowed    Additional Social History:   Allergies:   Allergies  Allergen Reactions   Other Other (See Comments)    Seasonal allergies    Metabolic Disorder Labs: Lab Results  Component Value Date   HGBA1C 5.6 05/09/2020   Lab Results  Component Value Date   PROLACTIN 21.4 02/28/2020   PROLACTIN 14.6 03/05/2019   Lab Results  Component Value Date   CHOL 222 (H) 11/12/2019   TRIG 64 11/12/2019   HDL 95 11/12/2019   CHOLHDL 2.3 11/12/2019   VLDL 17.0 11/09/2018   LDLCALC 112 (H) 11/12/2019   LDLCALC 116 (H) 11/09/2018   Lab Results  Component Value Date   TSH 2.75 11/12/2019    Therapeutic Level Labs: No results found for: LITHIUM No results found for: CBMZ No results found for: VALPROATE  Current Medications: Current Outpatient Medications  Medication Sig Dispense Refill   carbamide peroxide (DEBROX) 6.5 % OTIC solution Place 5 drops into the left ear 2 (two) times daily. 15 mL 0   cetirizine (ZYRTEC) 10 MG tablet Take 10 mg by mouth daily.     famotidine (PEPCID) 20 MG tablet Take 1 tablet by mouth twice daily 180 tablet 3   FLUoxetine (PROZAC) 20 MG capsule Take 1 capsule (20 mg total) by mouth daily. 30 capsule 5   hydrocortisone (ANUSOL-HC)  25 MG suppository Place 1 suppository (25 mg total) rectally at bedtime. 12 suppository 3   ipratropium (ATROVENT) 0.06 % nasal spray Place 2 sprays into both nostrils 4 (four) times daily. 15 mL 6   montelukast (SINGULAIR) 10 MG tablet TAKE 1 TABLET BY MOUTH AT BEDTIME 90 tablet 2   pantoprazole (PROTONIX) 40 MG tablet Take 1 tablet by mouth once daily 90 tablet 3   polyethylene glycol (MIRALAX) packet Take 17 g  by mouth daily. 14 each 0   pravastatin (PRAVACHOL) 20 MG tablet Take 1 tablet by mouth once daily 90 tablet 3   risperiDONE (RISPERDAL) 0.25 MG tablet 1 qhs 30 tablet 5   triazolam (HALCION) 0.125 MG tablet Take 1 tablet (0.125 mg total) by mouth once for 1 dose. Take prior to MRI 1 tablet 0   TURMERIC PO Take by mouth.     No current facility-administered medications for this visit.    Musculoskeletal: Strength & Muscle Tone: within normal limits Gait & Station: normal Patient leans:   Psychiatric Specialty Exam: ROS  There were no vitals taken for this visit.There is no height or weight on file to calculate BMI.  General Appearance: Casual  Eye Contact:  Good  Speech: Good  Volume:  Normal  Mood:  NA  Affect:  NA and Appropriate  Thought Process:  Coherent  Orientation:  Full (Time, Place, and Person)  Thought Content:  Logical  Suicidal Thoughts:  No  Homicidal Thoughts:  No  Memory:  Negative  Judgement:  Good  Insight:  Fair  Psychomotor Activity:  Normal  Concentration:    Recall:  Good  Fund of Knowledge:Good  Language: Good  Akathisia:  No  Handed:    AIMS (if indicated):  not done  Assets:  Desire for Improvement  ADL's:  Intact  Cognition: WNL  Sleep:  Good   Screenings: PHQ2-9    Flowsheet Row Office Visit from 04/11/2020 in [facility] Clinical Support from 11/10/2019 in [facility] [place] from 11/09/2018 in [facility] from 12/03/2017 in [facility] [place] from 10/28/2017 in [facility] at [facility]  PHQ-2 Total Score 0 1 0 0 0      [facility] from 03/16/2018 in [facility] from 02/22/2018 in [facility]  C-SSRS RISK CATEGORY No Risk No Risk       Assessment and Plan:  8/2/20221:39 PM   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  At this point she is in remission.  Today we will go ahead and reduce her Risperdal from 25 mg twice daily to just taking 1 at night.  When she returns in 4 months we will consider the possibility of discontinuing it altogether.  Should continue taking Prozac as prescribed.  The patient is very stable at this time.

## 2020-10-04 ENCOUNTER — Ambulatory Visit
Admission: RE | Admit: 2020-10-04 | Discharge: 2020-10-04 | Disposition: A | Discharge status: Home / Self Care | Payer: Medicare HMO | Source: Ambulatory Visit | Attending: Family Medicine | Admitting: Family Medicine

## 2020-10-04 ENCOUNTER — Other Ambulatory Visit: Payer: Self-pay

## 2020-10-04 DIAGNOSIS — Z1231 Encounter for screening mammogram for malignant neoplasm of breast: Secondary | ICD-10-CM

## 2020-10-25 ENCOUNTER — Other Ambulatory Visit (HOSPITAL_COMMUNITY): Payer: Self-pay | Admitting: Psychiatry

## 2020-10-25 DIAGNOSIS — F411 Generalized anxiety disorder: Secondary | ICD-10-CM

## 2020-10-25 DIAGNOSIS — F32A Depression, unspecified: Secondary | ICD-10-CM

## 2020-10-25 DIAGNOSIS — F329 Major depressive disorder, single episode, unspecified: Secondary | ICD-10-CM

## 2020-11-07 ENCOUNTER — Other Ambulatory Visit: Payer: Self-pay

## 2020-11-08 ENCOUNTER — Encounter: Payer: Self-pay | Admitting: Family Medicine

## 2020-11-08 ENCOUNTER — Ambulatory Visit (INDEPENDENT_AMBULATORY_CARE_PROVIDER_SITE_OTHER): Payer: Medicare HMO | Admitting: Family Medicine

## 2020-11-08 VITALS — BP 124/70 | HR 70 | Temp 98.5°F | Resp 16 | Ht 63.0 in | Wt 150.2 lb

## 2020-11-08 DIAGNOSIS — E785 Hyperlipidemia, unspecified: Secondary | ICD-10-CM

## 2020-11-08 DIAGNOSIS — Z0000 Encounter for general adult medical examination without abnormal findings: Secondary | ICD-10-CM | POA: Diagnosis not present

## 2020-11-08 DIAGNOSIS — R350 Frequency of micturition: Secondary | ICD-10-CM

## 2020-11-08 DIAGNOSIS — Z23 Encounter for immunization: Secondary | ICD-10-CM | POA: Diagnosis not present

## 2020-11-08 LAB — URINALYSIS, ROUTINE W REFLEX MICROSCOPIC
Bilirubin Urine: NEGATIVE
Hgb urine dipstick: NEGATIVE
Ketones, ur: NEGATIVE
Leukocytes,Ua: NEGATIVE
Nitrite: NEGATIVE
RBC / HPF: NONE SEEN (ref 0–?)
Specific Gravity, Urine: 1.01 (ref 1.000–1.030)
Total Protein, Urine: NEGATIVE
Urine Glucose: NEGATIVE
Urobilinogen, UA: 0.2 (ref 0.0–1.0)
pH: 6.5 (ref 5.0–8.0)

## 2020-11-08 LAB — COMPREHENSIVE METABOLIC PANEL
ALT: 11 U/L (ref 0–35)
AST: 18 U/L (ref 0–37)
Albumin: 4 g/dL (ref 3.5–5.2)
Alkaline Phosphatase: 55 U/L (ref 39–117)
BUN: 13 mg/dL (ref 6–23)
CO2: 30 mEq/L (ref 19–32)
Calcium: 9.7 mg/dL (ref 8.4–10.5)
Chloride: 105 mEq/L (ref 96–112)
Creatinine, Ser: 1.13 mg/dL (ref 0.40–1.20)
GFR: 48.36 mL/min — ABNORMAL LOW (ref >60.00)
Glucose, Bld: 83 mg/dL (ref 70–99)
Potassium: 4.1 mEq/L (ref 3.5–5.1)
Sodium: 141 mEq/L (ref 135–145)
Total Bilirubin: 0.4 mg/dL (ref 0.2–1.2)
Total Protein: 7.2 g/dL (ref 6.0–8.3)

## 2020-11-08 LAB — LIPID PANEL
Cholesterol: 212 mg/dL — ABNORMAL HIGH (ref 0–200)
HDL: 90.2 mg/dL (ref >39.00)
LDL Cholesterol: 111 mg/dL — ABNORMAL HIGH (ref 0–99)
NonHDL: 121.55
Total CHOL/HDL Ratio: 2
Triglycerides: 52 mg/dL (ref 0.0–149.0)
VLDL: 10.4 mg/dL (ref 0.0–40.0)

## 2020-11-08 NOTE — Assessment & Plan Note (Signed)
Continue Pravastatin 20 mg daily and low fat diet. Further recommendations will be given according to FLP result.

## 2020-11-08 NOTE — Patient Instructions (Addendum)
A few things to remember from today's visit:  Hyperlipidemia, unspecified hyperlipidemia type - Plan: Comprehensive metabolic panel, Lipid panel  Urinary frequency - Plan: Urinalysis, Routine w reflex microscopic  Routine general medical examination at a health care facility  If you need refills please call your pharmacy. Do not use My Chart to request refills or for acute issues that need immediate attention.   For now we can hold on medications for urinary frequency due to the risk of side effects. Try Kegel and pelvic floor exercise. Kegel Exercises Kegel exercises can help strengthen your pelvic floor muscles. The pelvic floor is a group of muscles that support your rectum, small intestine, and bladder. In females, pelvic floor muscles also help support the womb (uterus). These muscles help you control the flow of urine and stool. Kegel exercises are painless and simple, and they do not require any equipment. Your provider may suggest Kegel exercises to: Improve bladder and bowel control. Improve sexual response. Improve weak pelvic floor muscles after surgery to remove the uterus (hysterectomy) or pregnancy (females). Improve weak pelvic floor muscles after prostate gland removal or surgery (males). Kegel exercises involve squeezing your pelvic floor muscles, which are the same muscles you squeeze when you try to stop the flow of urine or keep from passing gas. The exercises can be done while sitting, standing, or lying down, but it is best to vary your position. Exercises How to do Kegel exercises: Squeeze your pelvic floor muscles tight. You should feel a tight lift in your rectal area. If you are a female, you should also feel a tightness in your vaginal area. Keep your stomach, buttocks, and legs relaxed. Hold the muscles tight for up to 10 seconds. Breathe normally. Relax your muscles. Repeat as told by your health care provider. Repeat this exercise daily as told by your health  care provider. Continue to do this exercise for at least 4-6 weeks, or for as long as told by your health care provider. You may be referred to a physical therapist who can help you learn more about how to do Kegel exercises. Depending on your condition, your health care provider may recommend: Varying how long you squeeze your muscles. Doing several sets of exercises every day. Doing exercises for several weeks. Making Kegel exercises a part of your regular exercise routine. This information is not intended to replace advice given to you by your health care provider. Make sure you discuss any questions you have with your health care provider. Document Revised: 01/12/2020 Document Reviewed: 09/10/2017 Elsevier Patient Education  [place].  Please be sure medication list is accurate. If a new problem present, please set up appointment sooner than planned today.      Why follow it? Research shows. Those who follow the Mediterranean diet have a reduced risk of heart disease  The diet is associated with a reduced incidence of Parkinson's and Alzheimer's diseases People following the diet may have longer life expectancies and lower rates of chronic diseases  The Dietary Guidelines for Americans recommends the Mediterranean diet as an eating plan to promote health and prevent disease  What Is the Mediterranean Diet?  Healthy eating plan based on typical foods and recipes of Mediterranean-style cooking The diet is primarily a plant based diet; these foods should make up a majority of meals   Starches - Plant based foods should make up a majority of meals - They are an important sources of vitamins, minerals, energy, antioxidants, and fiber - Choose  whole grains, foods high in fiber and minimally processed items  - Typical grain sources include wheat, oats, barley, corn, brown rice, bulgar, farro, millet, polenta, couscous  - Various types of beans include chickpeas, lentils, fava  beans, black beans, white beans   Fruits  Veggies - Large quantities of antioxidant rich fruits & veggies; 6 or more servings  - Vegetables can be eaten raw or lightly drizzled with oil and cooked  - Vegetables common to the traditional Mediterranean Diet include: artichokes, arugula, beets, broccoli, brussel sprouts, cabbage, carrots, celery, collard greens, cucumbers, eggplant, kale, leeks, lemons, lettuce, mushrooms, okra, onions, peas, peppers, potatoes, pumpkin, radishes, rutabaga, shallots, spinach, sweet potatoes, turnips, zucchini - Fruits common to the Mediterranean Diet include: apples, apricots, avocados, cherries, clementines, dates, figs, grapefruits, grapes, melons, nectarines, oranges, peaches, pears, pomegranates, strawberries, tangerines  Fats - Replace butter and margarine with healthy oils, such as olive oil, canola oil, and tahini  - Limit nuts to no more than a handful a day  - Nuts include walnuts, almonds, pecans, pistachios, pine nuts  - Limit or avoid candied, honey roasted or heavily salted nuts - Olives are central to the [employer] - can be eaten whole or used in a variety of dishes   Meats Protein - Limiting red meat: no more than a few times a month - When eating red meat: choose lean cuts and keep the portion to the size of deck of cards - Eggs: approx. 0 to 4 times a week  - Fish and lean poultry: at least 2 a week  - Healthy protein sources include, chicken, [place], lean beef, lamb - Increase intake of seafood such as tuna, salmon, trout, mackerel, shrimp, scallops - Avoid or limit high fat processed meats such as sausage and bacon  Dairy - Include moderate amounts of low fat dairy products  - Focus on healthy dairy such as fat free yogurt, skim milk, low or reduced fat cheese - Limit dairy products higher in fat such as whole or 2% milk, cheese, ice cream  Alcohol - Moderate amounts of red wine is ok  - No more than 5 oz daily for women (all ages) and  men older than age 1  - No more than 10 oz of wine daily for men younger than 58  Other - Limit sweets and other desserts  - Use herbs and spices instead of salt to flavor foods  - Herbs and spices common to the traditional Mediterranean Diet include: basil, bay leaves, chives, cloves, cumin, fennel, garlic, lavender, marjoram, mint, oregano, parsley, pepper, rosemary, sage, savory, sumac, tarragon, thyme   It's not just a diet, it's a lifestyle:  The Mediterranean diet includes lifestyle factors typical of those in the region  Foods, drinks and meals are best eaten with others and savored Daily physical activity is important for overall good health This could be strenuous exercise like running and aerobics This could also be more leisurely activities such as walking, housework, yard-work, or taking the stairs Moderation is the key; a balanced and healthy diet accommodates most foods and drinks Consider portion sizes and frequency of consumption of certain foods   Meal Ideas & Options:  Breakfast:  Whole wheat toast or whole wheat English muffins with peanut butter & hard boiled egg Steel cut oats topped with apples & cinnamon and skim milk  Fresh fruit: banana, strawberries, melon, berries, peaches  Smoothies: strawberries, bananas, greek yogurt, peanut butter Low fat greek yogurt with blueberries and granola  Egg white omelet with spinach and mushrooms Breakfast couscous: whole wheat couscous, apricots, skim milk, cranberries  Sandwiches:  Hummus and grilled vegetables (peppers, zucchini, squash) on whole wheat bread   Grilled chicken on whole wheat pita with lettuce, tomatoes, cucumbers or tzatziki   salad on whole wheat bread: tuna salad made with greek yogurt, olives, red peppers, capers, green onions Garlic rosemary lamb pita: lamb sauted with garlic, rosemary, salt & pepper; add lettuce, cucumber, greek yogurt to pita - flavor with lemon juice and black pepper  Seafood:   Mediterranean grilled salmon, seasoned with garlic, basil, parsley, lemon juice and black pepper Shrimp, lemon, and spinach whole-grain pasta salad made with low fat greek yogurt  Seared scallops with lemon orzo  Seared tuna steaks seasoned salt, pepper, coriander topped with tomato mixture of olives, tomatoes, olive oil, minced garlic, parsley, green onions and cappers  Meats:  Herbed greek chicken salad with kalamata olives, cucumber, feta  Red bell peppers stuffed with spinach, bulgur, lean ground beef (or lentils) & topped with feta   Kebabs: skewers of chicken, tomatoes, onions, zucchini, squash  [place] burgers: made with red onions, mint, dill, lemon juice, feta cheese topped with roasted red peppers Vegetarian Cucumber salad: cucumbers, artichoke hearts, celery, red onion, feta cheese, tossed in olive oil & lemon juice  Hummus and whole grain pita points with a greek salad (lettuce, tomato, feta, olives, cucumbers, red onion) Lentil soup with celery, carrots made with vegetable broth, garlic, salt and pepper  Tabouli salad: parsley, bulgur, mint, scallions, cucumbers, tomato, radishes, lemon juice, olive oil, salt and pepper.

## 2020-11-08 NOTE — Progress Notes (Signed)
HPI: Nancy Herman is a 75 y.o. female, who is here today for her routine physical.  Last CPE: 05/10/2019.  Regular exercise 3 or more time per week: Not consistently for the past fe months.  Following a healthy diet: She acknowledges she does not eat enough vegetables.  She lives alone.  Her son lives in town and called her regularly.  Chronic medical problems: Generalized OA, hyperlipidemia, depression, anxiety, GERD, IBS, and chronic cough among some. She follows with psychiatrist for depression and anxiety. She sees Dr. Loanne Drilling for pituitary adenoma. Evaluated by cardiologist for chest pain.  In 11/2019 she underwent stress test.  Immunization History  Administered Date(s) Administered   Fluad Quad(high Dose 65+) 11/09/2018, 11/09/2019, 11/08/2020   Influenza, High Dose Seasonal PF 11/06/2015, 10/24/2016, 12/03/2017   PFIZER(Purple Top)SARS-COV-2 Vaccination 02/26/2019, 03/19/2019, 12/04/2019   Pneumococcal Conjugate-13 09/04/2013   Pneumococcal Polysaccharide-23 08/04/2012, 05/10/2019   Zoster Recombinat (Shingrix) 04/04/2017, 06/06/2017, 07/05/2017, 08/13/2017   Mammogram: 10/08/2020 Bi-Rads 1 Colonoscopy: 07/28/2019. DEXA: 11/15/2019 osteopenia. She is taking Ca++ and vit D supplementation. Major Osteoporotic Fracture: 6.5% Hip Fracture:                1.5%  Hep C screening: 12/2017. Hepatitis C Ab NON-REACTI NON-REACTIVE   SIGNAL TO CUT-OFF <1.00 0.02    HLD: She is on Pravastatin 20 mg daily.  Lab Results  Component Value Date   CHOL 222 (H) 11/12/2019   HDL 95 11/12/2019   LDLCALC 112 (H) 11/12/2019   TRIG 64 11/12/2019   CHOLHDL 2.3 11/12/2019   Urine urgency and frequency. Negative for for gross hematuria or dysuria. No associated bulging perineal sensation with straining. Problem has been going on for a while but getting worse for the past few months.  Review of Systems  Constitutional:  Positive for fatigue. Negative for appetite change and  fever.  HENT:  Negative for hearing loss, mouth sores, trouble swallowing and voice change.   Eyes:  Negative for redness and visual disturbance.  Respiratory:  Negative for shortness of breath and wheezing.   Cardiovascular:  Negative for palpitations and leg swelling.  Gastrointestinal:  Negative for abdominal pain, nausea and vomiting.       No changes in bowel habits.  Endocrine: Negative for cold intolerance, heat intolerance, polydipsia, polyphagia and polyuria.  Genitourinary:  Negative for decreased urine volume, flank pain, vaginal bleeding and vaginal discharge.  Musculoskeletal:  Positive for arthralgias. Negative for gait problem.  Skin:  Negative for color change and rash.  Allergic/Immunologic: Positive for environmental allergies.  Neurological:  Negative for syncope, weakness and headaches.  Psychiatric/Behavioral:  Negative for confusion. The patient is nervous/anxious.   All other systems reviewed and are negative.  Current Outpatient Medications on File Prior to Visit  Medication Sig Dispense Refill   carbamide peroxide (DEBROX) 6.5 % OTIC solution Place 5 drops into the left ear 2 (two) times daily. 15 mL 0   cetirizine (ZYRTEC) 10 MG tablet Take 10 mg by mouth daily.     famotidine (PEPCID) 20 MG tablet Take 1 tablet by mouth twice daily 180 tablet 3   FLUoxetine (PROZAC) 20 MG capsule Take 1 capsule (20 mg total) by mouth daily. 30 capsule 5   hydrocortisone (ANUSOL-HC) 25 MG suppository Place 1 suppository (25 mg total) rectally at bedtime. 12 suppository 3   ipratropium (ATROVENT) 0.06 % nasal spray Place 2 sprays into both nostrils 4 (four) times daily. 15 mL 6   montelukast (SINGULAIR) 10 MG tablet  TAKE 1 TABLET BY MOUTH AT BEDTIME 90 tablet 2   pantoprazole (PROTONIX) 40 MG tablet Take 1 tablet by mouth once daily 90 tablet 3   polyethylene glycol (MIRALAX) packet Take 17 g by mouth daily. 14 each 0   pravastatin (PRAVACHOL) 20 MG tablet Take 1 tablet by mouth  once daily 90 tablet 3   risperiDONE (RISPERDAL) 0.25 MG tablet 1 qhs 30 tablet 5   TURMERIC PO Take by mouth.     triazolam (HALCION) 0.125 MG tablet Take 1 tablet (0.125 mg total) by mouth once for 1 dose. Take prior to MRI 1 tablet 0   No current facility-administered medications on file prior to visit.   Past Medical History:  Diagnosis Date   Anxiety    Frequent headaches    GERD (gastroesophageal reflux disease)    Hyperlipidemia    IBS (irritable bowel syndrome)    Pituitary insufficiency (HCC)    Thyroid disease     Past Surgical History:  Procedure Laterality Date   ABDOMINAL HYSTERECTOMY  1990   ANAL RECTAL MANOMETRY N/A 08/14/2018   Procedure: ANO RECTAL MANOMETRY;  Surgeon: Mauri Pole, MD;  Location: [facility];  Service: Endoscopy;  Laterality: N/A;   BRAIN SURGERY     BREAST EXCISIONAL BIOPSY Right unsure   BREAST EXCISIONAL BIOPSY Left unsure   OVARIAN CYST SURGERY  1994   REPAIR RECTOCELE  2016   and prolapsed uterus.   TONSILLECTOMY  1973   Allergies  Allergen Reactions   Other Other (See Comments)    Seasonal allergies   Family History  Problem Relation Age of Onset   Heart disease Mother        No details    Alcohol abuse Mother    Drug abuse Mother    Sickle cell anemia Mother    Heart disease Father        No details . Questionable bypass or stent   Drug abuse Father    Alcohol abuse Father    Social History   Socioeconomic History   Marital status: Widowed    Spouse name: Not on file   Number of children: 2   Years of education: Not on file   Highest education level: Not on file  Occupational History   Occupation: retired  Tobacco Use   Smoking status: Never   Smokeless tobacco: Never  Vaping Use   Vaping Use: Never used  Substance and Sexual Activity   Alcohol use: No   Drug use: No   Sexual activity: Never  Other Topics Concern   Not on file  Social History Narrative   Lives alone.  Lives near son.  Was in [place].    Social Determinants of Health   Financial Resource Strain: Low Risk    Difficulty of Paying Living Expenses: Not hard at all  Food Insecurity: No Food Insecurity   Worried About Charity fundraiser in the Last Year: Never true   [place] in the Last Year: Never true  Transportation Needs: No Transportation Needs   Lack of Transportation (Medical): No   Lack of Transportation (Non-Medical): No  Physical Activity: Insufficiently Active   Days of Exercise per Week: 3 days   Minutes of Exercise per Session: 20 min  Stress: No Stress Concern Present   Feeling of Stress : Only a little  Social Connections: Socially Isolated   Frequency of Communication with Friends and Family: More than three times a week   Frequency  of Social Gatherings with Friends and Family: Never   Attends Religious Services: Never   Marine scientist or Organizations: No   Attends Archivist Meetings: Never   Marital Status: Widowed   Vitals:   11/08/20 0914  BP: 124/70  Pulse: 70  Resp: 16  Temp: 98.5 F (36.9 C)  SpO2: 97%   Body mass index is 26.62 kg/m.  Wt Readings from Last 3 Encounters:  11/08/20 150 lb 4 oz (68.2 kg)  05/09/20 152 lb 6 oz (69.1 kg)  02/28/20 154 lb 3.2 oz (69.9 kg)   Physical Exam Vitals and nursing note reviewed.  Constitutional:      General: She is not in acute distress.    Appearance: She is well-developed.  HENT:     Head: Normocephalic and atraumatic.     Right Ear: Tympanic membrane, ear canal and external ear normal.     Left Ear: External ear normal.     Ears:     Comments: Cerumen excess left ear canal , TM seen partially.    Mouth/Throat:     Mouth: Mucous membranes are moist.     Pharynx: Oropharynx is clear. Uvula midline.  Eyes:     Conjunctiva/sclera: Conjunctivae normal.     Pupils: Pupils are equal, round, and reactive to light.  Neck:     Thyroid: No thyroid mass.     Trachea: No tracheal deviation.  Cardiovascular:      Rate and Rhythm: Normal rate and regular rhythm.     Pulses:          Dorsalis pedis pulses are 2+ on the right side and 2+ on the left side.     Heart sounds: No murmur heard. Pulmonary:     Effort: Pulmonary effort is normal. No respiratory distress.     Breath sounds: Normal breath sounds.  Abdominal:     Palpations: Abdomen is soft. There is no hepatomegaly or mass.     Tenderness: There is no abdominal tenderness.  Musculoskeletal:     Comments: No signs of synovitis appreciated.  Lymphadenopathy:     Cervical: No cervical adenopathy.  Skin:    General: Skin is warm.     Findings: No erythema or rash.  Neurological:     General: No focal deficit present.     Mental Status: She is alert and oriented to person, place, and time.     Cranial Nerves: No cranial nerve deficit.     Coordination: Coordination normal.     Gait: Gait normal.     Deep Tendon Reflexes:     Reflex Scores:      Bicep reflexes are 2+ on the right side and 2+ on the left side.      Patellar reflexes are 2+ on the right side and 2+ on the left side. Psychiatric:        Mood and Affect: Mood is anxious.        Speech: Speech normal.     Comments: Well groomed, good eye contact.   ASSESSMENT AND PLAN:  Nancy Herman was here today annual physical examination.  Orders Placed This Encounter  Procedures   Flu Vaccine QUAD High Dose(Fluad)   Urinalysis, Routine w reflex microscopic   Comprehensive metabolic panel   Lipid panel   Lab Results  Component Value Date   CREATININE 1.13 11/08/2020   BUN 13 11/08/2020   NA 141 11/08/2020   K 4.1 11/08/2020   CL 105  11/08/2020   CO2 30 11/08/2020   Lab Results  Component Value Date   CHOL 212 (H) 11/08/2020   HDL 90.20 11/08/2020   LDLCALC 111 (H) 11/08/2020   TRIG 52.0 11/08/2020   CHOLHDL 2 11/08/2020   Lab Results  Component Value Date   ALT 11 11/08/2020   AST 18 11/08/2020   ALKPHOS 55 11/08/2020   BILITOT 0.4 11/08/2020    Routine general medical examination at a health care facility We discussed the importance of regular physical activity and healthy diet for prevention of chronic illness and/or complications. Preventive guidelines reviewed. Vaccination up to date. Ca++ and vit D supplementation to continue. Next CPE in a year. The 10-year ASCVD risk score (Arnett DK, et al., 2019) is: 16.5%   Values used to calculate the score:     Age: 55 years     Sex: Female     Is Non-Hispanic African American: Yes     Diabetic: No     Tobacco smoker: No     Systolic Blood Pressure: 841 mmHg     Is BP treated: No     HDL Cholesterol: 90.2 mg/dL     Total Cholesterol: 212 mg/dL  Urinary frequency Chronic. We discussed possible etiologies. ? Overactive bladder. Treatment options reviewed, because risk of side effects, we decided to hold on pharmacologic treatment. Kegel and pelvic floor exercises may help.  Need for influenza vaccination -     Flu Vaccine QUAD High Dose(Fluad)  Hyperlipidemia Continue Pravastatin 20 mg daily and low fat diet. Further recommendations will be given according to FLP result.  Return in 1 year (on 11/08/2021).  Vyncent Overby G. [place], MD  [facility]. [place] office.

## 2020-11-09 MED ORDER — PRAVASTATIN SODIUM 20 MG PO TABS: 20.0000 mg | ORAL_TABLET | Freq: Every day | ORAL | 3 refills | Status: DC

## 2020-11-13 ENCOUNTER — Telehealth: Payer: Self-pay | Admitting: Family Medicine

## 2020-11-13 NOTE — Telephone Encounter (Signed)
I called and spoke with patient. We went over the information below & she verbalized understanding.  

## 2020-11-13 NOTE — Telephone Encounter (Signed)
She can take pravastatin at bedtime or with supper. It is okay for her to get COVID-19 booster. Thanks, BJ

## 2020-11-13 NOTE — Telephone Encounter (Signed)
Patient is wondering when is the best time to take pravastatin (PRAVACHOL) 20 MG tablet [[id number]] . She also wants to know could she go ahead and Covid shot now or do she have to wait until later.  Patient could be contacted at [phone].  Please advise.

## 2020-11-14 ENCOUNTER — Other Ambulatory Visit: Payer: Self-pay

## 2020-11-14 ENCOUNTER — Ambulatory Visit (INDEPENDENT_AMBULATORY_CARE_PROVIDER_SITE_OTHER): Payer: Medicare HMO

## 2020-11-14 VITALS — BP 110/64 | HR 79 | Temp 98.5°F | Wt 153.4 lb

## 2020-11-14 DIAGNOSIS — Z0000 Encounter for general adult medical examination without abnormal findings: Secondary | ICD-10-CM | POA: Diagnosis not present

## 2020-11-14 NOTE — Patient Instructions (Signed)
Nancy Herman , Thank you for taking time to come for your Medicare Wellness Visit. I appreciate your ongoing commitment to your health goals. Please review the following plan we discussed and let me know if I can assist you in the future.   Screening recommendations/referrals: Colonoscopy: Done 07/28/19 repeat every 3 years due 07/28/22 Mammogram: Done 10/04/20  repeat every year Bone Density: Done 11/15/19 repeat every 2 years  Recommended yearly ophthalmology/optometry visit for glaucoma screening and checkup Recommended yearly dental visit for hygiene and checkup  Vaccinations: Influenza vaccine: Done 11/08/20 Pneumococcal vaccine: Up to date Tdap vaccine: Due and discussed Shingles vaccine: Completed 3/1, 5/3, 6/1, & 08/13/17   Covid-19:Completed 1/22, 2/12, & 12/04/19  Advanced directives: Please bring a copy of your health care power of attorney and living will to the office at your convenience.   Conditions/risks identified: Exercise and eat healthy  Next appointment: Follow up in one year for your annual wellness visit    Preventive Care 65 Years and Older, Female Preventive care refers to lifestyle choices and visits with your health care provider that can promote health and wellness. What does preventive care include? A yearly physical exam. This is also called an annual well check. Dental exams once or twice a year. Routine eye exams. Ask your health care provider how often you should have your eyes checked. Personal lifestyle choices, including: Daily care of your teeth and gums. Regular physical activity. Eating a healthy diet. Avoiding tobacco and drug use. Limiting alcohol use. Practicing safe sex. Taking low-dose aspirin every day. Taking vitamin and mineral supplements as recommended by your health care provider. What happens during an annual well check? The services and screenings done by your health care provider during your annual well check will depend on your  age, overall health, lifestyle risk factors, and family history of disease. Counseling  Your health care provider may ask you questions about your: Alcohol use. Tobacco use. Drug use. Emotional well-being. Home and relationship well-being. Sexual activity. Eating habits. History of falls. Memory and ability to understand (cognition). Work and work Statistician. Reproductive health. Screening  You may have the following tests or measurements: Height, weight, and BMI. Blood pressure. Lipid and cholesterol levels. These may be checked every 5 years, or more frequently if you are over 3 years old. Skin check. Lung cancer screening. You may have this screening every year starting at age 76 if you have a 30-pack-year history of smoking and currently smoke or have quit within the past 15 years. Fecal occult blood test (FOBT) of the stool. You may have this test every year starting at age 32. Flexible sigmoidoscopy or colonoscopy. You may have a sigmoidoscopy every 5 years or a colonoscopy every 10 years starting at age 69. Hepatitis C blood test. Hepatitis B blood test. Sexually transmitted disease (STD) testing. Diabetes screening. This is done by checking your blood sugar (glucose) after you have not eaten for a while (fasting). You may have this done every 1-3 years. Bone density scan. This is done to screen for osteoporosis. You may have this done starting at age 23. Mammogram. This may be done every 1-2 years. Talk to your health care provider about how often you should have regular mammograms. Talk with your health care provider about your test results, treatment options, and if necessary, the need for more tests. Vaccines  Your health care provider may recommend certain vaccines, such as: Influenza vaccine. This is recommended every year. Tetanus, diphtheria, and acellular pertussis (Tdap,  Td) vaccine. You may need a Td booster every 10 years. Zoster vaccine. You may need this after  age 82. Pneumococcal 13-valent conjugate (PCV13) vaccine. One dose is recommended after age 32. Pneumococcal polysaccharide (PPSV23) vaccine. One dose is recommended after age 50. Talk to your health care provider about which screenings and vaccines you need and how often you need them. This information is not intended to replace advice given to you by your health care provider. Make sure you discuss any questions you have with your health care provider. Document Released: 02/17/2015 Document Revised: 10/11/2015 Document Reviewed: 11/22/2014 Elsevier Interactive Patient Education  2017 [place] Prevention in the Home Falls can cause injuries. They can happen to people of all ages. There are many things you can do to make your home safe and to help prevent falls. What can I do on the outside of my home? Regularly fix the edges of walkways and driveways and fix any cracks. Remove anything that might make you trip as you walk through a door, such as a raised step or threshold. Trim any bushes or trees on the path to your home. Use bright outdoor lighting. Clear any walking paths of anything that might make someone trip, such as rocks or tools. Regularly check to see if handrails are loose or broken. Make sure that both sides of any steps have handrails. Any raised decks and porches should have guardrails on the edges. Have any leaves, snow, or ice cleared regularly. Use sand or salt on walking paths during winter. Clean up any spills in your garage right away. This includes oil or grease spills. What can I do in the bathroom? Use night lights. Install grab bars by the toilet and in the tub and shower. Do not use towel bars as grab bars. Use non-skid mats or decals in the tub or shower. If you need to sit down in the shower, use a plastic, non-slip stool. Keep the floor dry. Clean up any water that spills on the floor as soon as it happens. Remove soap buildup in the tub or shower  regularly. Attach bath mats securely with double-sided non-slip rug tape. Do not have throw rugs and other things on the floor that can make you trip. What can I do in the bedroom? Use night lights. Make sure that you have a light by your bed that is easy to reach. Do not use any sheets or blankets that are too big for your bed. They should not hang down onto the floor. Have a firm chair that has side arms. You can use this for support while you get dressed. Do not have throw rugs and other things on the floor that can make you trip. What can I do in the kitchen? Clean up any spills right away. Avoid walking on wet floors. Keep items that you use a lot in easy-to-reach places. If you need to reach something above you, use a strong step stool that has a grab bar. Keep electrical cords out of the way. Do not use floor polish or wax that makes floors slippery. If you must use wax, use non-skid floor wax. Do not have throw rugs and other things on the floor that can make you trip. What can I do with my stairs? Do not leave any items on the stairs. Make sure that there are handrails on both sides of the stairs and use them. Fix handrails that are broken or loose. Make sure that handrails are as  long as the stairways. Check any carpeting to make sure that it is firmly attached to the stairs. Fix any carpet that is loose or worn. Avoid having throw rugs at the top or bottom of the stairs. If you do have throw rugs, attach them to the floor with carpet tape. Make sure that you have a light switch at the top of the stairs and the bottom of the stairs. If you do not have them, ask someone to add them for you. What else can I do to help prevent falls? Wear shoes that: Do not have high heels. Have rubber bottoms. Are comfortable and fit you well. Are closed at the toe. Do not wear sandals. If you use a stepladder: Make sure that it is fully opened. Do not climb a closed stepladder. Make sure that  both sides of the stepladder are locked into place. Ask someone to hold it for you, if possible. Clearly mark and make sure that you can see: Any grab bars or handrails. First and last steps. Where the edge of each step is. Use tools that help you move around (mobility aids) if they are needed. These include: Canes. Walkers. Scooters. Crutches. Turn on the lights when you go into a dark area. Replace any light bulbs as soon as they burn out. Set up your furniture so you have a clear path. Avoid moving your furniture around. If any of your floors are uneven, fix them. If there are any pets around you, be aware of where they are. Review your medicines with your doctor. Some medicines can make you feel dizzy. This can increase your chance of falling. Ask your doctor what other things that you can do to help prevent falls. This information is not intended to replace advice given to you by your health care provider. Make sure you discuss any questions you have with your health care provider. Document Released: 11/17/2008 Document Revised: 06/29/2015 Document Reviewed: 02/25/2014 Elsevier Interactive Patient Education  2017 [employer].

## 2020-11-14 NOTE — Progress Notes (Addendum)
Subjective:   Nancy Herman is a 75 y.o. female who presents for Medicare Annual (Subsequent) preventive examination.  Review of Systems     Cardiac Risk Factors include: advanced age (>67men, >53 women);dyslipidemia     Objective:    Today's Vitals   11/14/20 1021  BP: 110/64  Pulse: 79  Temp: 98.5 F (36.9 C)  SpO2: 96%  Weight: 153 lb 6.4 oz (69.6 kg)   Body mass index is 27.17 kg/m.  Advanced Directives 11/14/2020 11/10/2019 09/29/2018 03/17/2018 03/17/2018 03/16/2018 02/27/2018  Does Patient Have a Medical Advance Directive? Yes Yes Yes No No - No  Type of Advance Directive Living will [place] will - - - -  Does patient want to make changes to medical advance directive? - - No - Patient declined - - - -  Copy of [place] in Chart? - No - copy requested - - - - -  Would patient like information on creating a medical advance directive? - - - No - Patient declined - No - Patient declined No - Patient declined    Current Medications (verified) Outpatient Encounter Medications as of 11/14/2020  Medication Sig   carbamide peroxide (DEBROX) 6.5 % OTIC solution Place 5 drops into the left ear 2 (two) times daily.   cetirizine (ZYRTEC) 10 MG tablet Take 10 mg by mouth daily.   famotidine (PEPCID) 20 MG tablet Take 1 tablet by mouth twice daily   FLUoxetine (PROZAC) 20 MG capsule Take 1 capsule (20 mg total) by mouth daily.   hydrocortisone (ANUSOL-HC) 25 MG suppository Place 1 suppository (25 mg total) rectally at bedtime.   ipratropium (ATROVENT) 0.06 % nasal spray Place 2 sprays into both nostrils 4 (four) times daily.   montelukast (SINGULAIR) 10 MG tablet TAKE 1 TABLET BY MOUTH AT BEDTIME   pantoprazole (PROTONIX) 40 MG tablet Take 1 tablet by mouth once daily   polyethylene glycol (MIRALAX) packet Take 17 g by mouth daily.   pravastatin (PRAVACHOL) 20 MG tablet Take 1 tablet (20 mg total) by mouth daily.   risperiDONE  (RISPERDAL) 0.25 MG tablet 1 qhs   TURMERIC PO Take by mouth.   triazolam (HALCION) 0.125 MG tablet Take 1 tablet (0.125 mg total) by mouth once for 1 dose. Take prior to MRI   No facility-administered encounter medications on file as of 11/14/2020.    Allergies (verified) Other   History: Past Medical History:  Diagnosis Date   Anxiety    Frequent headaches    GERD (gastroesophageal reflux disease)    Hyperlipidemia    IBS (irritable bowel syndrome)    Pituitary insufficiency (HCC)    Thyroid disease    Past Surgical History:  Procedure Laterality Date   ABDOMINAL HYSTERECTOMY  1990   ANAL RECTAL MANOMETRY N/A 08/14/2018   Procedure: ANO RECTAL MANOMETRY;  Surgeon: Mauri Pole, MD;  Location: [facility];  Service: Endoscopy;  Laterality: N/A;   BRAIN SURGERY     BREAST EXCISIONAL BIOPSY Right unsure   BREAST EXCISIONAL BIOPSY Left unsure   OVARIAN CYST SURGERY  1994   REPAIR RECTOCELE  2016   and prolapsed uterus.   TONSILLECTOMY  1973   Family History  Problem Relation Age of Onset   Heart disease Mother        No details    Alcohol abuse Mother    Drug abuse Mother    Sickle cell anemia Mother    Heart disease Father  No details . Questionable bypass or stent   Drug abuse Father    Alcohol abuse Father    Social History   Socioeconomic History   Marital status: Widowed    Spouse name: Not on file   Number of children: 2   Years of education: Not on file   Highest education level: Not on file  Occupational History   Occupation: retired  Tobacco Use   Smoking status: Never   Smokeless tobacco: Never  Vaping Use   Vaping Use: Never used  Substance and Sexual Activity   Alcohol use: No   Drug use: No   Sexual activity: Never  Other Topics Concern   Not on file  Social History Narrative   Lives alone.  Lives near son.  Was in [place].   Social Determinants of Health   Financial Resource Strain: Low Risk    Difficulty of Paying Living  Expenses: Not hard at all  Food Insecurity: No Food Insecurity   Worried About Charity fundraiser in the Last Year: Never true   Reading in the Last Year: Never true  Transportation Needs: No Transportation Needs   Lack of Transportation (Medical): No   Lack of Transportation (Non-Medical): No  Physical Activity: Inactive   Days of Exercise per Week: 0 days   Minutes of Exercise per Session: 0 min  Stress: No Stress Concern Present   Feeling of Stress : Not at all  Social Connections: Moderately Isolated   Frequency of Communication with Friends and Family: More than three times a week   Frequency of Social Gatherings with Friends and Family: Once a week   Attends Religious Services: 1 to 4 times per year   Active Member of [employer] or Organizations: No   Attends Archivist Meetings: Never   Marital Status: Widowed    Tobacco Counseling Counseling given: Not Answered   Clinical Intake:  Pre-visit preparation completed: Yes  Pain : No/denies pain     BMI - recorded: 27.17 Nutritional Status: BMI 25 -29 Overweight Nutritional Risks: None Diabetes: No  How often do you need to have someone help you when you read instructions, pamphlets, or other written materials from your doctor or pharmacy?: 1 - Never  Diabetic?No  Interpreter Needed?: No  Information entered by :: Charlott Rakes, LPN   Activities of Daily Living In your present state of health, do you have any difficulty performing the following activities: 11/14/2020  Hearing? Y  Comment slight loss and wax build up  Vision? N  Difficulty concentrating or making decisions? N  Walking or climbing stairs? N  Dressing or bathing? N  Doing errands, shopping? N  Preparing Food and eating ? N  Using the Toilet? N  In the past six months, have you accidently leaked urine? Y  Comment urgency at times  Do you have problems with loss of bowel control? N  Managing your Medications? N  Managing your  Finances? N  Housekeeping or managing your Housekeeping? N  Some recent data might be hidden    Patient Care Team: [place], Betty G, MD as PCP - General (Family Medicine) Minus Breeding, MD as PCP - Cardiology (Cardiology)  Indicate any recent Medical Services you may have received from other than [facility] providers in the past year (date may be approximate).     Assessment:   This is a routine wellness examination for [place].  Hearing/Vision screen Hearing Screening - Comments:: Pt has slight loss  Vision  Screening - Comments:: Pt follows up With Dr Gershon Crane for annual eye eexams   Dietary issues and exercise activities discussed: Current Exercise Habits: The patient does not participate in regular exercise at present   Goals Addressed             This Visit's Progress    Patient Stated       Work on exercise and eating right        Depression Screen PHQ 2/9 Scores 11/14/2020 11/10/2019 11/10/2018 12/03/2017 10/28/2017 06/16/2017 10/24/2016  PHQ - 2 Score 0 1 0 0 0 0 0  PHQ- 9 Score - - - - - 0 -  Some encounter information is confidential and restricted. Go to Review Flowsheets activity to see all data.    Fall Risk Fall Risk  11/14/2020 11/08/2020 11/10/2019 11/09/2019 11/10/2018  Falls in the past year? 0 0 0 0 0  Number falls in past yr: 0 0 0 0 0  Injury with Fall? 0 0 0 0 0  Risk for fall due to : Impaired vision;Impaired balance/gait - Impaired vision - -  Follow up Falls prevention discussed Education provided Falls prevention discussed Education provided Education provided    FALL RISK PREVENTION PERTAINING TO THE HOME:  Any stairs in or around the home? Yes  If so, are there any without handrails? No  Home free of loose throw rugs in walkways, pet beds, electrical cords, etc? Yes  Adequate lighting in your home to reduce risk of falls? Yes   ASSISTIVE DEVICES UTILIZED TO PREVENT FALLS:  Life alert? No  Use of a cane, walker or w/c? No  Grab bars in the  bathroom? Yes  Shower chair or bench in shower? Yes  Elevated toilet seat or a handicapped toilet? No   TIMED UP AND GO:  Was the test performed? Yes .  Length of time to ambulate 10 feet: 10 sec.   Gait steady and fast without use of assistive device  Cognitive Function: MMSE - Mini Mental State Exam 10/28/2017  Not completed: (No Data)     6CIT Screen 11/14/2020 11/10/2019  What Year? 0 points 0 points  What month? 3 points 0 points  What time? 0 points -  Count back from 20 0 points 0 points  Months in reverse 4 points 0 points  Repeat phrase 2 points 0 points  Total Score 9 -    Immunizations Immunization History  Administered Date(s) Administered   Fluad Quad(high Dose 65+) 11/09/2018, 11/09/2019, 11/08/2020   Influenza, High Dose Seasonal PF 11/06/2015, 10/24/2016, 12/03/2017   PFIZER(Purple Top)SARS-COV-2 Vaccination 02/26/2019, 03/19/2019, 12/04/2019   Pneumococcal Conjugate-13 09/04/2013   Pneumococcal Polysaccharide-23 08/04/2012, 05/10/2019   Zoster Recombinat (Shingrix) 04/04/2017, 06/06/2017, 07/05/2017, 08/13/2017      Flu Vaccine status: Up to date  Pneumococcal vaccine status: Up to date  Covid-19 vaccine status: Completed vaccines  Qualifies for Shingles Vaccine? Yes   Zostavax completed Yes   Shingrix Completed?: Yes  Screening Tests Health Maintenance  Topic Date Due   COVID-19 Vaccine (4 - Booster for Pfizer series) 02/26/2020   COLONOSCOPY (Pts 45-74yrs Insurance coverage will need to be confirmed)  07/28/2022   MAMMOGRAM  10/05/2022   INFLUENZA VACCINE  Completed   DEXA SCAN  Completed   Hepatitis C Screening  Completed   Zoster Vaccines- Shingrix  Completed   HPV VACCINES  Aged Out   TETANUS/TDAP  Discontinued    Health Maintenance  Health Maintenance Due  Topic Date Due   COVID-19  Vaccine (4 - Booster for Pfizer series) 02/26/2020    Colorectal cancer screening: Type of screening: Colonoscopy. Completed 07/28/19. Repeat every  3 years  Mammogram status: Completed 10/04/20. Repeat every year  Bone Density status: Completed 11/15/19. Results reflect: Bone density results: OSTEOPENIA. Repeat every 2 years.   Additional Screening:  Hepatitis C Screening:  Completed 12/08/17  Vision Screening: Recommended annual ophthalmology exams for early detection of glaucoma and other disorders of the eye. Is the patient up to date with their annual eye exam?  Yes  Who is the provider or what is the name of the office in which the patient attends annual eye exams? Dr Gershon Crane If pt is not established with a provider, would they like to be referred to a provider to establish care? No .   Dental Screening: Recommended annual dental exams for proper oral hygiene  Community Resource Referral / Chronic Care Management: CRR required this visit?  No   CCM required this visit?  No      Plan:     I have personally reviewed and noted the following in the patient's chart:   Medical and social history Use of alcohol, tobacco or illicit drugs  Current medications and supplements including opioid prescriptions.  Functional ability and status Nutritional status Physical activity Advanced directives List of other physicians Hospitalizations, surgeries, and ER visits in previous 12 months Vitals Screenings to include cognitive, depression, and falls Referrals and appointments  In addition, I have reviewed and discussed with patient certain preventive protocols, quality metrics, and best practice recommendations. A written personalized care plan for preventive services as well as general preventive health recommendations were provided to patient.     Willette Brace, LPN   30/16/0109   Nurse Notes: None

## 2020-11-16 ENCOUNTER — Telehealth: Payer: Self-pay | Admitting: Family Medicine

## 2020-11-16 NOTE — Telephone Encounter (Signed)
Pt is calling and would like her blood work results from 11-08-2020

## 2020-11-17 NOTE — Telephone Encounter (Signed)
Pt informed of the results and verbalized understanding. Pt inquired if she was due for Pneumonia vaccination?

## 2020-11-21 NOTE — Telephone Encounter (Signed)
I spoke with patient, she is aware that she has received her pneumonia vaccines.

## 2020-11-21 NOTE — Telephone Encounter (Signed)
Patient has had all her pneumonia vaccines, will call to inform pt.

## 2020-12-05 ENCOUNTER — Encounter: Payer: Self-pay | Admitting: Gastroenterology

## 2020-12-05 ENCOUNTER — Ambulatory Visit: Payer: Medicare HMO | Admitting: Gastroenterology

## 2020-12-05 VITALS — BP 94/66 | HR 72 | Ht 63.0 in | Wt 150.0 lb

## 2020-12-05 DIAGNOSIS — K5900 Constipation, unspecified: Secondary | ICD-10-CM

## 2020-12-05 DIAGNOSIS — K649 Unspecified hemorrhoids: Secondary | ICD-10-CM

## 2020-12-05 DIAGNOSIS — K581 Irritable bowel syndrome with constipation: Secondary | ICD-10-CM | POA: Diagnosis not present

## 2020-12-05 DIAGNOSIS — N816 Rectocele: Secondary | ICD-10-CM

## 2020-12-05 DIAGNOSIS — M6289 Other specified disorders of muscle: Secondary | ICD-10-CM

## 2020-12-05 NOTE — Progress Notes (Signed)
Nancy Herman    [id number]    1945/09/26  Primary Care Physician:Jordan, Malka So, MD  Referring Physician: Martinique, Betty G, MD [place],  [place] [address]   Chief complaint:  IBS, hemorrhoids  HPI: 75 year old female with history of chronic IBS predominant constipation here for follow-up visit.   She is currently taking MiraLAX 1 capful as needed, she continues to struggle with constipation, she has sensation of incomplete evacuation and has to go back to commode few times.  She has intermittent symptoms from hemorrhoids.  Anorectal manometry consistent with dyssynergy defecation.  She completed pelvic floor physical therapy with biofeedback with some improvement but she feels it has not resolved the problem.   Relevant Hx: She is status post TAH/BSO and also had rectocele repair in 2016 Colonoscopy March 31, 2014 in [place] showed internal hemorrhoids, mild proctitis biopsies showed colonic mucosal hyperplasia and fibrosis suggestive of rectal prolapse, tubular adenoma removed from ascending colon.  Recommend recall colonoscopy in 5 years EGD March 31, 2014 showed gastritis, duodenitis and small hiatal hernia EGD August 12, 2016 by Dr. Collene Mares showed small hiatal hernia otherwise normal exam  Colonoscopy 07/28/19 - Two 1 to 2 mm polyps in the transverse colon and in the cecum, removed with a cold biopsy forceps. Resected and retrieved. - One 5 mm polyp in the sigmoid colon, removed with a cold snare. Resected and retrieved. - Erythematous mucosa in the rectum. Biopsied. - Diverticulosis in the sigmoid colon. - Non-bleeding internal hemorrhoids.   Outpatient Encounter Medications as of 12/05/2020  Medication Sig   carbamide peroxide (DEBROX) 6.5 % OTIC solution Place 5 drops into the left ear 2 (two) times daily.   cetirizine (ZYRTEC) 10 MG tablet Take 10 mg by mouth daily.   famotidine (PEPCID) 20 MG tablet Take 1 tablet by mouth twice  daily   FLUoxetine (PROZAC) 20 MG capsule Take 1 capsule (20 mg total) by mouth daily.   ipratropium (ATROVENT) 0.06 % nasal spray Place 2 sprays into both nostrils 4 (four) times daily.   montelukast (SINGULAIR) 10 MG tablet TAKE 1 TABLET BY MOUTH AT BEDTIME   pantoprazole (PROTONIX) 40 MG tablet Take 1 tablet by mouth once daily   polyethylene glycol (MIRALAX) packet Take 17 g by mouth daily.   pravastatin (PRAVACHOL) 20 MG tablet Take 1 tablet (20 mg total) by mouth daily.   risperiDONE (RISPERDAL) 0.25 MG tablet 1 qhs   TURMERIC PO Take by mouth.   triazolam (HALCION) 0.125 MG tablet Take 1 tablet (0.125 mg total) by mouth once for 1 dose. Take prior to MRI   [DISCONTINUED] hydrocortisone (ANUSOL-HC) 25 MG suppository Place 1 suppository (25 mg total) rectally at bedtime. (Patient not taking: Reported on 12/05/2020)   No facility-administered encounter medications on file as of 12/05/2020.    Allergies as of 12/05/2020 - Review Complete 12/05/2020  Allergen Reaction Noted   Other Other (See Comments) 10/28/2017    Past Medical History:  Diagnosis Date   Anxiety    Frequent headaches    GERD (gastroesophageal reflux disease)    Hyperlipidemia    IBS (irritable bowel syndrome)    Pituitary insufficiency (HCC)    Thyroid disease     Past Surgical History:  Procedure Laterality Date   ABDOMINAL HYSTERECTOMY  1990   ANAL RECTAL MANOMETRY N/A 08/14/2018   Procedure: ANO RECTAL MANOMETRY;  Surgeon: Mauri Pole, MD;  Location: [facility];  Service: Endoscopy;  Laterality: N/A;   BRAIN SURGERY     BREAST EXCISIONAL BIOPSY Right unsure   BREAST EXCISIONAL BIOPSY Left unsure   OVARIAN CYST SURGERY  1994   REPAIR RECTOCELE  2016   and prolapsed uterus.   TONSILLECTOMY  1973    Family History  Problem Relation Age of Onset   Heart disease Mother        No details    Alcohol abuse Mother    Drug abuse Mother    Sickle cell anemia Mother    Heart disease Father         No details . Questionable bypass or stent   Drug abuse Father    Alcohol abuse Father    Colon cancer Maternal Aunt    Esophageal cancer Neg Hx    Pancreatic cancer Neg Hx    Stomach cancer Neg Hx     Social History   Socioeconomic History   Marital status: Widowed    Spouse name: Not on file   Number of children: 2   Years of education: Not on file   Highest education level: Not on file  Occupational History   Occupation: retired  Tobacco Use   Smoking status: Never   Smokeless tobacco: Never  Vaping Use   Vaping Use: Never used  Substance and Sexual Activity   Alcohol use: No   Drug use: No   Sexual activity: Never  Other Topics Concern   Not on file  Social History Narrative   Lives alone.  Lives near son.  Was in [place].   Social Determinants of Health   Financial Resource Strain: Low Risk    Difficulty of Paying Living Expenses: Not hard at all  Food Insecurity: No Food Insecurity   Worried About Charity fundraiser in the Last Year: Never true   [place] in the Last Year: Never true  Transportation Needs: No Transportation Needs   Lack of Transportation (Medical): No   Lack of Transportation (Non-Medical): No  Physical Activity: Inactive   Days of Exercise per Week: 0 days   Minutes of Exercise per Session: 0 min  Stress: No Stress Concern Present   Feeling of Stress : Not at all  Social Connections: Moderately Isolated   Frequency of Communication with Friends and Family: More than three times a week   Frequency of Social Gatherings with Friends and Family: Once a week   Attends Religious Services: 1 to 4 times per year   Active Member of [employer] or Organizations: No   Attends Archivist Meetings: Never   Marital Status: Widowed  Human resources officer Violence: Not At Risk   Fear of Current or Ex-Partner: No   Emotionally Abused: No   Physically Abused: No   Sexually Abused: No      Review of systems: All other review of systems negative  except as mentioned in the HPI.   Physical Exam: Vitals:   12/05/20 1410  BP: 94/66  Pulse: 72  SpO2: 99%   Body mass index is 26.57 kg/m. Gen:      No acute distress HEENT:  sclera anicteric Abd:      soft, non-tender; no palpable masses, no distension Ext:    No edema Neuro: alert and oriented x 3 Psych: normal mood and affect Rectal exam: Normal anal sphincter tone, no anal fissure or external hemorrhoids.  Small rectocele Anoscopy: Small internal hemorrhoids, no active bleeding, normal dentate line, no visible nodules  Data Reviewed:  Reviewed labs, radiology imaging, old records and pertinent past GI work up   Assessment and Plan/Recommendations:  75 year old very pleasant female with history of pituitary adenoma s/p resection, TAH/BSO s/p rectocele repair, pelvic floor dysfunction with dyssynergic defecation and chronic irritable bowel syndrome predominant constipation   Constipation: secondary to dyssynergic defecation with pelvic floor dysfunction, recurrent rectocele and chronic irritable bowel syndrome Continue MiraLAX 1 capful daily Add fiber 1 tablespoon 3 times daily with meals Continue high-fiber diet and increase fluid intake  Symptomatic hemorrhoids: Use Calmol suppositories as needed Avoid excessive straining during defecation   History of adenomatous colon polyps: Recall colonoscopy in June 2024  Return in 1 year or sooner if needed   The patient was provided an opportunity to ask questions and all were answered. The patient agreed with the plan and demonstrated an understanding of the instructions.  Damaris Hippo , MD    CC: Martinique, Betty G, MD

## 2020-12-05 NOTE — Patient Instructions (Signed)
ake Benefiber or Citrucel 1 tablespoon three times a day with meals  Continue Miralax 1 capful daily  Use Calmol suppositories as needed  Follow up in 1 year  If you are age 73 or older, your body mass index should be between 23-30. Your Body mass index is 26.18 kg/m. If this is out of the aforementioned range listed, please consider follow up with your Primary Care Provider.  If you are age 68 or younger, your body mass index should be between 19-25. Your Body mass index is 26.18 kg/m. If this is out of the aformentioned range listed, please consider follow up with your Primary Care Provider.   ________________________________________________________  The [facility] providers would like to encourage you to use [facility] to communicate with providers for non-urgent requests or questions.  Due to long hold times on the telephone, sending your provider a message by [facility] may be a faster and more efficient way to get a response.  Please allow 48 business hours for a response.  Please remember that this is for non-urgent requests.  _______________________________________________________   I appreciate the  opportunity to care for you  Thank You   Harl Bowie , MD

## 2020-12-08 ENCOUNTER — Encounter: Payer: Self-pay | Admitting: Gastroenterology

## 2021-01-09 ENCOUNTER — Other Ambulatory Visit: Payer: Self-pay

## 2021-01-09 ENCOUNTER — Telehealth (HOSPITAL_BASED_OUTPATIENT_CLINIC_OR_DEPARTMENT_OTHER): Payer: Medicare HMO | Admitting: Psychiatry

## 2021-01-09 DIAGNOSIS — F32A Depression, unspecified: Secondary | ICD-10-CM

## 2021-01-09 DIAGNOSIS — F411 Generalized anxiety disorder: Secondary | ICD-10-CM

## 2021-01-09 DIAGNOSIS — F323 Major depressive disorder, single episode, severe with psychotic features: Secondary | ICD-10-CM | POA: Diagnosis not present

## 2021-01-09 MED ORDER — RISPERIDONE 0.25 MG PO TABS: ORAL_TABLET | ORAL | 5 refills | Status: DC

## 2021-01-09 MED ORDER — FLUOXETINE HCL 20 MG PO CAPS: 20.0000 mg | ORAL_CAPSULE | Freq: Every day | ORAL | 5 refills | Status: DC

## 2021-01-09 NOTE — Progress Notes (Signed)
Psychiatric Initial Adult Assessment   Patient Identification: Nancy Herman MRN:  [id number] Date of Evaluation:  01/09/2021 Referral Source: Dr. Betty Martinique Chief Complaint:     Visit Diagnosis:  No diagnosis found.   History of Present Illness:   Today the patient is not doing very well.  Unfortunately since the reduction of the Risperdal her voices have returned.  She hears 2 voices talking to each other to her.  She does not recognize them.  In the past when they started talking they would eventually make commands for her like to sell her house.  She is very comfortable with these voices.  She has some degree of feeling depressed but it is in response to the voices as much as anything.  There is no new psychosocial stressors.  She is sleeping well.  She is eating okay.  She has no paranoia.  The patient still enjoys her dog who is 61 years old.  The patient still drives.  The patient is not suicidal.  She drinks no alcohol and uses no drugs.   Depression Symptoms:  difficulty concentrating, (Hypo) Manic Symptoms:   Anxiety Symptoms:   Psychotic Symptoms:   PTSD Symptoms:   Past Psychiatric History: Prozac for many years  Previous Psychotropic Medications:   Substance Abuse History in the last 12 months:  No.  Consequences of Substance Abuse:   Past Medical History:  Past Medical History:  Diagnosis Date   Anxiety    Frequent headaches    GERD (gastroesophageal reflux disease)    Hyperlipidemia    IBS (irritable bowel syndrome)    Pituitary insufficiency (HCC)    Thyroid disease     Past Surgical History:  Procedure Laterality Date   ABDOMINAL HYSTERECTOMY  1990   ANAL RECTAL MANOMETRY N/A 08/14/2018   Procedure: ANO RECTAL MANOMETRY;  Surgeon: Mauri Pole, MD;  Location: [facility];  Service: Endoscopy;  Laterality: N/A;   BRAIN SURGERY     BREAST EXCISIONAL BIOPSY Right unsure   BREAST EXCISIONAL BIOPSY Left unsure   OVARIAN CYST SURGERY  1994    REPAIR RECTOCELE  2016   and prolapsed uterus.   TONSILLECTOMY  1973    Family Psychiatric History:   Family History:  Family History  Problem Relation Age of Onset   Heart disease Mother        No details    Alcohol abuse Mother    Drug abuse Mother    Sickle cell anemia Mother    Heart disease Father        No details . Questionable bypass or stent   Drug abuse Father    Alcohol abuse Father    Colon cancer Maternal Aunt    Esophageal cancer Neg Hx    Pancreatic cancer Neg Hx    Stomach cancer Neg Hx     Social History:   Social History   Socioeconomic History   Marital status: Widowed    Spouse name: Not on file   Number of children: 2   Years of education: Not on file   Highest education level: Not on file  Occupational History   Occupation: retired  Tobacco Use   Smoking status: Never   Smokeless tobacco: Never  Vaping Use   Vaping Use: Never used  Substance and Sexual Activity   Alcohol use: No   Drug use: No   Sexual activity: Never  Other Topics Concern   Not on file  Social History Narrative   Lives alone.  Lives near son.  Was in [place].   Social Determinants of Health   Financial Resource Strain: Low Risk    Difficulty of Paying Living Expenses: Not hard at all  Food Insecurity: No Food Insecurity   Worried About Charity fundraiser in the Last Year: Never true   [place] in the Last Year: Never true  Transportation Needs: No Transportation Needs   Lack of Transportation (Medical): No   Lack of Transportation (Non-Medical): No  Physical Activity: Inactive   Days of Exercise per Week: 0 days   Minutes of Exercise per Session: 0 min  Stress: No Stress Concern Present   Feeling of Stress : Not at all  Social Connections: Moderately Isolated   Frequency of Communication with Friends and Family: More than three times a week   Frequency of Social Gatherings with Friends and Family: Once a week   Attends Religious Services: 1 to 4 times  per year   Active Member of [employer] or Organizations: No   Attends Archivist Meetings: Never   Marital Status: Widowed    Additional Social History:   Allergies:   Allergies  Allergen Reactions   Other Other (See Comments)    Seasonal allergies    Metabolic Disorder Labs: Lab Results  Component Value Date   HGBA1C 5.6 05/09/2020   Lab Results  Component Value Date   PROLACTIN 21.4 02/28/2020   PROLACTIN 14.6 03/05/2019   Lab Results  Component Value Date   CHOL 212 (H) 11/08/2020   TRIG 52.0 11/08/2020   HDL 90.20 11/08/2020   CHOLHDL 2 11/08/2020   VLDL 10.4 11/08/2020   LDLCALC 111 (H) 11/08/2020   LDLCALC 112 (H) 11/12/2019   Lab Results  Component Value Date   TSH 2.75 11/12/2019    Therapeutic Level Labs: No results found for: LITHIUM No results found for: CBMZ No results found for: VALPROATE  Current Medications: Current Outpatient Medications  Medication Sig Dispense Refill   carbamide peroxide (DEBROX) 6.5 % OTIC solution Place 5 drops into the left ear 2 (two) times daily. 15 mL 0   cetirizine (ZYRTEC) 10 MG tablet Take 10 mg by mouth daily.     famotidine (PEPCID) 20 MG tablet Take 1 tablet by mouth twice daily 180 tablet 3   FLUoxetine (PROZAC) 20 MG capsule Take 1 capsule (20 mg total) by mouth daily. 30 capsule 5   ipratropium (ATROVENT) 0.06 % nasal spray Place 2 sprays into both nostrils 4 (four) times daily. 15 mL 6   montelukast (SINGULAIR) 10 MG tablet TAKE 1 TABLET BY MOUTH AT BEDTIME 90 tablet 2   pantoprazole (PROTONIX) 40 MG tablet Take 1 tablet by mouth once daily 90 tablet 3   polyethylene glycol (MIRALAX) packet Take 17 g by mouth daily. 14 each 0   pravastatin (PRAVACHOL) 20 MG tablet Take 1 tablet (20 mg total) by mouth daily. 90 tablet 3   risperiDONE (RISPERDAL) 0.25 MG tablet 1  Bid 60 tablet 5   triazolam (HALCION) 0.125 MG tablet Take 1 tablet (0.125 mg total) by mouth once for 1 dose. Take prior to MRI 1 tablet 0    TURMERIC PO Take by mouth.     No current facility-administered medications for this visit.    Musculoskeletal: Strength & Muscle Tone: within normal limits Gait & Station: normal Patient leans:   Psychiatric Specialty Exam: ROS  There were no vitals taken for this visit.There is no height or weight on file  to calculate BMI.  General Appearance: Casual  Eye Contact:  Good  Speech: Good  Volume:  Normal  Mood:  NA  Affect:  NA and Appropriate  Thought Process:  Coherent  Orientation:  Full (Time, Place, and Person)  Thought Content:  Logical  Suicidal Thoughts:  No  Homicidal Thoughts:  No  Memory:  Negative  Judgement:  Good  Insight:  Fair  Psychomotor Activity:  Normal  Concentration:    Recall:  Good  Fund of Knowledge:Good  Language: Good  Akathisia:  No  Handed:    AIMS (if indicated):  not done  Assets:  Desire for Improvement  ADL's:  Intact  Cognition: WNL  Sleep:  Good   Screenings: PHQ2-9    Flowsheet Row Clinical Support from 11/14/2020 in [place] at [place] from 04/11/2020 in [place] from 11/10/2019 in [place] at [place] from 11/09/2018 in [facility] [facility] [place] from 12/03/2017 in [facility] at [place]  PHQ-2 Total Score 0 0 1 0 0      [place] [facility] from 03/16/2018 in [place] [facility] from 02/22/2018 in [place] DEPT  C-SSRS RISK CATEGORY No Risk No Risk       Assessment and Plan:  12/6/20222:00 PM   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  At this time we will increase her Risperdal back to her usual dose of 0.25 mg twice daily.  She will continue taking her Prozac as prescribed.  This patient should come in to see me in 5 weeks in person.  She was told that if her voices do not improve significantly  in 3 weeks she should contact us again.  At that time we will likely increase her Risperdal.

## 2021-01-12 ENCOUNTER — Telehealth: Payer: Self-pay | Admitting: Gastroenterology

## 2021-01-12 MED ORDER — LINACLOTIDE 145 MCG PO CAPS: 145.0000 ug | ORAL_CAPSULE | Freq: Every day | ORAL | 3 refills | Status: DC

## 2021-01-12 NOTE — Telephone Encounter (Signed)
FYI- Dr Silverio Decamp, The pt called today c/o constipation and said the Miralax was not helping. She wanted Linzess 145 mcg, I seen in the past it was given to her by her PCP. Not [place]. But I still sent it in for her and gave her  samples

## 2021-01-12 NOTE — Telephone Encounter (Signed)
Patient called to inquire about getting Linzess please advise.

## 2021-01-15 ENCOUNTER — Telehealth: Payer: Self-pay | Admitting: Gastroenterology

## 2021-01-15 NOTE — Telephone Encounter (Signed)
Ok, thanks.

## 2021-01-15 NOTE — Telephone Encounter (Signed)
I returned patients call, I explained to her about Dina Rich the patient assistance program. She is willing to do that but she just started her 145 mcg samples yesterday Sunday.    I told her lets wait and see how the samples are working for her before we fill out the application. Just incase she needs to lower the dose or make it higher.  She will contact me once she is almost complete with her samples

## 2021-01-15 NOTE — Telephone Encounter (Signed)
Inbound call from patient states she went to pick up her Linzess proscription and they were going to charge her 100 dollars. Seeking advise.

## 2021-01-22 ENCOUNTER — Telehealth: Payer: Self-pay | Admitting: Family Medicine

## 2021-01-22 MED ORDER — ESOMEPRAZOLE MAGNESIUM 40 MG PO CPDR: 40.0000 mg | DELAYED_RELEASE_CAPSULE | Freq: Every day | ORAL | 3 refills | Status: DC

## 2021-01-22 NOTE — Telephone Encounter (Signed)
I called and spoke with patient. She is aware Rx for Nexium 40 mg daily has been sent in. She is aware that if this does not help, a follow up with GI would be the next step.

## 2021-01-22 NOTE — Telephone Encounter (Signed)
She could try a different medication like Nexium 40 mg , omeprazole 40 mg daily,or aciphex 20 mg daily x 8 weeks.  She is established with GI, she could also arrange a follow up appt if problem is persistent. Thanks, BJ

## 2021-01-22 NOTE — Telephone Encounter (Signed)
Patient states that medication prescribed for acid reflux isn't working. She wants to know if it is something else that she could be prescribed to help her.  Patient could be contacted at [phone].  Please advise.

## 2021-01-24 ENCOUNTER — Telehealth: Payer: Self-pay | Admitting: Family Medicine

## 2021-01-24 NOTE — Telephone Encounter (Signed)
Pt is calling and needs clarification as to which gerd medications she suppose to stop

## 2021-01-24 NOTE — Telephone Encounter (Signed)
I called and spoke with patient. She is aware to stop the Pantoprazole and the famotidine. She will continue the Nexium and follow with GI if no improvement.

## 2021-01-30 ENCOUNTER — Other Ambulatory Visit: Payer: Self-pay

## 2021-01-30 ENCOUNTER — Telehealth: Payer: Self-pay | Admitting: Gastroenterology

## 2021-01-30 MED ORDER — LINACLOTIDE 145 MCG PO CAPS: 145.0000 ug | ORAL_CAPSULE | Freq: Every day | ORAL | 3 refills | Status: DC

## 2021-01-30 NOTE — Telephone Encounter (Signed)
Patient called stating that Dr. Silverio Decamp had given her samples of Linzess at her last visit. Patient requesting a proscription for Linzess. Please advise.

## 2021-01-30 NOTE — Telephone Encounter (Signed)
Returned patients call. She does not want to do application now. She wants to wait and try the 290 mcg first . Left samples out front for her to pick up.

## 2021-01-30 NOTE — Telephone Encounter (Signed)
I just spoke to her

## 2021-02-06 ENCOUNTER — Ambulatory Visit (HOSPITAL_COMMUNITY): Payer: Medicare HMO | Admitting: Psychiatry

## 2021-02-12 ENCOUNTER — Telehealth: Payer: Self-pay | Admitting: Gastroenterology

## 2021-02-12 NOTE — Telephone Encounter (Signed)
Patient called to follow up on previous message  

## 2021-02-12 NOTE — Telephone Encounter (Signed)
Patient called and stated that she had finished up her samples of Linzess, wanting to know the steps moving forward. Please advise.

## 2021-02-12 NOTE — Telephone Encounter (Signed)
Patient does not feel the [place] worked very well for her. She has tried the Linzess 290 mcg. Is there anything else she can try? I will look for samples.

## 2021-02-12 NOTE — Telephone Encounter (Signed)
Please check if her insurance will cover Trulance 3mg  daily and we can switch Rx to it for  X 30 days with 3 refills . Thanks

## 2021-02-13 ENCOUNTER — Ambulatory Visit (HOSPITAL_BASED_OUTPATIENT_CLINIC_OR_DEPARTMENT_OTHER): Payer: Medicare HMO | Admitting: Psychiatry

## 2021-02-13 ENCOUNTER — Other Ambulatory Visit: Payer: Self-pay

## 2021-02-13 DIAGNOSIS — F411 Generalized anxiety disorder: Secondary | ICD-10-CM

## 2021-02-13 DIAGNOSIS — F32A Depression, unspecified: Secondary | ICD-10-CM

## 2021-02-13 DIAGNOSIS — F323 Major depressive disorder, single episode, severe with psychotic features: Secondary | ICD-10-CM | POA: Diagnosis not present

## 2021-02-13 MED ORDER — TRULANCE 3 MG PO TABS: 1.0000 | ORAL_TABLET | Freq: Every day | ORAL | 6 refills | Status: DC

## 2021-02-13 MED ORDER — RISPERIDONE 0.5 MG PO TABS: ORAL_TABLET | ORAL | 3 refills | Status: DC

## 2021-02-13 MED ORDER — FLUOXETINE HCL 20 MG PO CAPS: 20.0000 mg | ORAL_CAPSULE | Freq: Every day | ORAL | 5 refills | Status: DC

## 2021-02-13 NOTE — Telephone Encounter (Signed)
Trulance samples at the front desk 3rd floor for the patient to pick up. She is instructed to take once daily. She may take at any time of the day and take with or without food. Written instructions with samples. PA initiated. Medication assistance program available. Patient is aware of these things. We will see if the medication is effective for her before going further with this process.

## 2021-02-13 NOTE — Progress Notes (Signed)
Psychiatric Initial Adult Assessment   Patient Identification: Nancy Herman MRN:  [id number] Date of Evaluation:  02/13/2021 Referral Source: Dr. Betty Martinique Chief Complaint:     Visit Diagnosis:    ICD-10-CM   1. Depressive disorder  F32.A FLUoxetine (PROZAC) 20 MG capsule    2. Generalized anxiety disorder  F41.1 FLUoxetine (PROZAC) 20 MG capsule       History of Present Illness:  Today the patient is not doing much better.  The voices are still very depressed.  They seem to be more definitive.  They tell her they want her to get out of the house because he got his cell.  She now knows the voices are not coming from the attic she hears and that in general way.  She hears them on a regular basis.  Patient has a number of other pain symptoms that are real and I think are worsening her emotional status.  She is dental problems and is going to see a dentist in the next day and likely will have to have significant dental surgery.  She also has irritable bowel syndrome.  She is being treated by a gastroenterologist and has just started new medication.  It is very uncomfortable for her but with her GI symptoms and her dental pain and I suspect they are exacerbating her auditory hallucinations.  She has no visual hallucinations and she is not really paranoid.  The voices are always intrusive and threatening trying to scare her.  The patient says she feels uncomfortable because of her pain to being out of the community should she find herself staying at home a lot.  This is a problem since she hears voices a lot lately.  The patient denies persistent daily depression.  She drinks no alcohol and uses no drugs.  She would like to go to church.  She is not oversedated.  She shows no evidence of tardive dyskinesia.   Depression Symptoms:  difficulty concentrating, (Hypo) Manic Symptoms:   Anxiety Symptoms:   Psychotic Symptoms:   PTSD Symptoms:   Past Psychiatric History: Prozac for many  years  Previous Psychotropic Medications:   Substance Abuse History in the last 12 months:  No.  Consequences of Substance Abuse:   Past Medical History:  Past Medical History:  Diagnosis Date   Anxiety    Frequent headaches    GERD (gastroesophageal reflux disease)    Hyperlipidemia    IBS (irritable bowel syndrome)    Pituitary insufficiency (HCC)    Thyroid disease     Past Surgical History:  Procedure Laterality Date   ABDOMINAL HYSTERECTOMY  1990   ANAL RECTAL MANOMETRY N/A 08/14/2018   Procedure: ANO RECTAL MANOMETRY;  Surgeon: Mauri Pole, MD;  Location: [facility];  Service: Endoscopy;  Laterality: N/A;   BRAIN SURGERY     BREAST EXCISIONAL BIOPSY Right unsure   BREAST EXCISIONAL BIOPSY Left unsure   OVARIAN CYST SURGERY  1994   REPAIR RECTOCELE  2016   and prolapsed uterus.   TONSILLECTOMY  1973    Family Psychiatric History:   Family History:  Family History  Problem Relation Age of Onset   Heart disease Mother        No details    Alcohol abuse Mother    Drug abuse Mother    Sickle cell anemia Mother    Heart disease Father        No details . Questionable bypass or stent   Drug abuse Father  Alcohol abuse Father    Colon cancer Maternal Aunt    Esophageal cancer Neg Hx    Pancreatic cancer Neg Hx    Stomach cancer Neg Hx     Social History:   Social History   Socioeconomic History   Marital status: Widowed    Spouse name: Not on file   Number of children: 2   Years of education: Not on file   Highest education level: Not on file  Occupational History   Occupation: retired  Tobacco Use   Smoking status: Never   Smokeless tobacco: Never  Vaping Use   Vaping Use: Never used  Substance and Sexual Activity   Alcohol use: No   Drug use: No   Sexual activity: Never  Other Topics Concern   Not on file  Social History Narrative   Lives alone.  Lives near son.  Was in [place].   Social Determinants of Health   Financial Resource  Strain: Low Risk    Difficulty of Paying Living Expenses: Not hard at all  Food Insecurity: No Food Insecurity   Worried About Charity fundraiser in the Last Year: Never true   [place] in the Last Year: Never true  Transportation Needs: No Transportation Needs   Lack of Transportation (Medical): No   Lack of Transportation (Non-Medical): No  Physical Activity: Inactive   Days of Exercise per Week: 0 days   Minutes of Exercise per Session: 0 min  Stress: No Stress Concern Present   Feeling of Stress : Not at all  Social Connections: Moderately Isolated   Frequency of Communication with Friends and Family: More than three times a week   Frequency of Social Gatherings with Friends and Family: Once a week   Attends Religious Services: 1 to 4 times per year   Active Member of [employer] or Organizations: No   Attends Archivist Meetings: Never   Marital Status: Widowed    Additional Social History:   Allergies:   Allergies  Allergen Reactions   Other Other (See Comments)    Seasonal allergies    Metabolic Disorder Labs: Lab Results  Component Value Date   HGBA1C 5.6 05/09/2020   Lab Results  Component Value Date   PROLACTIN 21.4 02/28/2020   PROLACTIN 14.6 03/05/2019   Lab Results  Component Value Date   CHOL 212 (H) 11/08/2020   TRIG 52.0 11/08/2020   HDL 90.20 11/08/2020   CHOLHDL 2 11/08/2020   VLDL 10.4 11/08/2020   LDLCALC 111 (H) 11/08/2020   LDLCALC 112 (H) 11/12/2019   Lab Results  Component Value Date   TSH 2.75 11/12/2019    Therapeutic Level Labs: No results found for: LITHIUM No results found for: CBMZ No results found for: VALPROATE  Current Medications: Current Outpatient Medications  Medication Sig Dispense Refill   carbamide peroxide (DEBROX) 6.5 % OTIC solution Place 5 drops into the left ear 2 (two) times daily. 15 mL 0   cetirizine (ZYRTEC) 10 MG tablet Take 10 mg by mouth daily.     esomeprazole (NEXIUM) 40 MG capsule Take  1 capsule (40 mg total) by mouth daily. 30 capsule 3   FLUoxetine (PROZAC) 20 MG capsule Take 1 capsule (20 mg total) by mouth daily. 30 capsule 5   ipratropium (ATROVENT) 0.06 % nasal spray Place 2 sprays into both nostrils 4 (four) times daily. 15 mL 6   montelukast (SINGULAIR) 10 MG tablet TAKE 1 TABLET BY MOUTH AT BEDTIME 90  tablet 2   Plecanatide (TRULANCE) 3 MG TABS Take 1 tablet by mouth daily. 12 tablet 6   polyethylene glycol (MIRALAX) packet Take 17 g by mouth daily. 14 each 0   pravastatin (PRAVACHOL) 20 MG tablet Take 1 tablet (20 mg total) by mouth daily. 90 tablet 3   risperiDONE (RISPERDAL) 0.5 MG tablet 1  Bid 60 tablet 3   triazolam (HALCION) 0.125 MG tablet Take 1 tablet (0.125 mg total) by mouth once for 1 dose. Take prior to MRI 1 tablet 0   TURMERIC PO Take by mouth.     No current facility-administered medications for this visit.    Musculoskeletal: Strength & Muscle Tone: within normal limits Gait & Station: normal Patient leans:   Psychiatric Specialty Exam: ROS  There were no vitals taken for this visit.There is no height or weight on file to calculate BMI.  General Appearance: Casual  Eye Contact:  Good  Speech: Good  Volume:  Normal  Mood:  NA  Affect:  NA and Appropriate  Thought Process:  Coherent  Orientation:  Full (Time, Place, and Person)  Thought Content:  Logical  Suicidal Thoughts:  No  Homicidal Thoughts:  No  Memory:  Negative  Judgement:  Good  Insight:  Fair  Psychomotor Activity:  Normal  Concentration:    Recall:  Good  Fund of Knowledge:Good  Language: Good  Akathisia:  No  Handed:    AIMS (if indicated):  not done  Assets:  Desire for Improvement  ADL's:  Intact  Cognition: WNL  Sleep:  Good   Screenings: PHQ2-9    Flowsheet Row Clinical Support from 11/14/2020 in [facility] at [facility] from 04/11/2020 in [facility] from 11/10/2019 in [facility] [place] [facility] from 11/09/2018 in [employer] at [facility] from 12/03/2017 in [facility] at [facility]  PHQ-2 Total Score 0 0 1 0 0      [facility] from 03/16/2018 in [facility] from 02/22/2018 in [facility]  C-SSRS RISK CATEGORY No Risk No Risk       Assessment and Plan:  1/10/20234:06 PM   Today unfortunately the patient is worse.  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  However the possibility of a primary psychotic disorder should be ruled out.  The possibility of schizoaffective disorder should be considered.  For now noted to hold her Risperdal.  She will increase it to 0.5 mg twice daily.  She will continue taking Prozac as prescribed.  Most nights she is sleeping pretty well.  Her appetite continues to be okay.  She continues to function well independently.  She still drives and is still active.  This patient she will return to see [place] in 7 weeks.  The possibility of increasing her Risperdal is not out of the question when she returns.

## 2021-02-22 NOTE — Telephone Encounter (Signed)
Nancy Herman has been taking Trulance for about 10 days now. She takes it at bedtime. She has had 2 spells of diarrhea. The other days are fine. She is not taking Miralax or fiber. Do you think she should continue? Linzess did not work at all for her.

## 2021-02-22 NOTE — Telephone Encounter (Signed)
Patient agrees to this plan. She will take the Trulance every morning on an empty stomach when she takes Nexium. Follow up by phone. Trulance has been approved by her insurance.

## 2021-02-22 NOTE — Telephone Encounter (Signed)
Please check if she can take Trulance in the morning on empty stomach instead of taking it at bedtime.  Continue to hold MiraLAX or any additional laxatives

## 2021-02-22 NOTE — Telephone Encounter (Signed)
Patient called stating that since she has started taking Trulance she has been experiencing diarrhea. Seeking advice, please advise.

## 2021-03-05 ENCOUNTER — Ambulatory Visit (INDEPENDENT_AMBULATORY_CARE_PROVIDER_SITE_OTHER): Payer: Medicare HMO | Admitting: Endocrinology

## 2021-03-05 ENCOUNTER — Other Ambulatory Visit: Payer: Self-pay

## 2021-03-05 ENCOUNTER — Encounter: Payer: Self-pay | Admitting: Endocrinology

## 2021-03-05 VITALS — BP 120/74 | HR 70 | Ht 63.0 in | Wt 154.4 lb

## 2021-03-05 DIAGNOSIS — D352 Benign neoplasm of pituitary gland: Secondary | ICD-10-CM | POA: Diagnosis not present

## 2021-03-05 LAB — T4, FREE: Free T4: 0.77 ng/dL (ref 0.60–1.60)

## 2021-03-05 LAB — TSH: TSH: 3.1 u[IU]/mL (ref 0.35–5.50)

## 2021-03-05 NOTE — Progress Notes (Signed)
Subjective:    Patient ID: Nancy Herman, female    DOB: Nov 20, 1945, 76 y.o.   MRN: [id number]  HPI Pt returns for f/u of pituitary adenoma (apparently nonsecretory; was 1.8 mm diameter; dx'ed and resected in 2017 (in [place]); she did not have XRT; she took synthroid until early 2020; since then, she is on no pituitary rx; f/u MRI in 2022 showed no residual tumor).   She had these pit function results in early 2020:  FSH/LH: normal (not menopausal).   Prol: normal ACTH: stim test normal GH: IGF-1 was normal VP: USG=1.010, VP was undetectable; 24-HR urine was 2300 ml.   TSH: euthyroid off synthroid (since 2020) pt states she feels well in general.  Past Medical History:  Diagnosis Date   Anxiety    Frequent headaches    GERD (gastroesophageal reflux disease)    Hyperlipidemia    IBS (irritable bowel syndrome)    Pituitary insufficiency (HCC)    Thyroid disease     Past Surgical History:  Procedure Laterality Date   ABDOMINAL HYSTERECTOMY  1990   ANAL RECTAL MANOMETRY N/A 08/14/2018   Procedure: ANO RECTAL MANOMETRY;  Surgeon: Mauri Pole, MD;  Location: [facility];  Service: Endoscopy;  Laterality: N/A;   BRAIN SURGERY     BREAST EXCISIONAL BIOPSY Right unsure   BREAST EXCISIONAL BIOPSY Left unsure   OVARIAN CYST SURGERY  1994   REPAIR RECTOCELE  2016   and prolapsed uterus.   TONSILLECTOMY  1973    Social History   Socioeconomic History   Marital status: Widowed    Spouse name: Not on file   Number of children: 2   Years of education: Not on file   Highest education level: Not on file  Occupational History   Occupation: retired  Tobacco Use   Smoking status: Never   Smokeless tobacco: Never  Vaping Use   Vaping Use: Never used  Substance and Sexual Activity   Alcohol use: No   Drug use: No   Sexual activity: Never  Other Topics Concern   Not on file  Social History Narrative   Lives alone.  Lives near son.  Was in [place].   Social Determinants  of Health   Financial Resource Strain: Low Risk    Difficulty of Paying Living Expenses: Not hard at all  Food Insecurity: No Food Insecurity   Worried About Charity fundraiser in the Last Year: Never true   [place] in the Last Year: Never true  Transportation Needs: No Transportation Needs   Lack of Transportation (Medical): No   Lack of Transportation (Non-Medical): No  Physical Activity: Inactive   Days of Exercise per Week: 0 days   Minutes of Exercise per Session: 0 min  Stress: No Stress Concern Present   Feeling of Stress : Not at all  Social Connections: Moderately Isolated   Frequency of Communication with Friends and Family: More than three times a week   Frequency of Social Gatherings with Friends and Family: Once a week   Attends Religious Services: 1 to 4 times per year   Active Member of [employer] or Organizations: No   Attends Archivist Meetings: Never   Marital Status: Widowed  Human resources officer Violence: Not At Risk   Fear of Current or Ex-Partner: No   Emotionally Abused: No   Physically Abused: No   Sexually Abused: No    Current Outpatient Medications on File Prior to Visit  Medication Sig  Dispense Refill   carbamide peroxide (DEBROX) 6.5 % OTIC solution Place 5 drops into the left ear 2 (two) times daily. 15 mL 0   cetirizine (ZYRTEC) 10 MG tablet Take 10 mg by mouth daily.     esomeprazole (NEXIUM) 40 MG capsule Take 1 capsule (40 mg total) by mouth daily. 30 capsule 3   FLUoxetine (PROZAC) 20 MG capsule Take 1 capsule (20 mg total) by mouth daily. 30 capsule 5   ipratropium (ATROVENT) 0.06 % nasal spray Place 2 sprays into both nostrils 4 (four) times daily. 15 mL 6   montelukast (SINGULAIR) 10 MG tablet TAKE 1 TABLET BY MOUTH AT BEDTIME 90 tablet 2   Plecanatide (TRULANCE) 3 MG TABS Take 1 tablet by mouth daily. 12 tablet 6   polyethylene glycol (MIRALAX) packet Take 17 g by mouth daily. 14 each 0   pravastatin (PRAVACHOL) 20 MG tablet  Take 1 tablet (20 mg total) by mouth daily. 90 tablet 3   risperiDONE (RISPERDAL) 0.5 MG tablet 1  Bid 60 tablet 3   TURMERIC PO Take by mouth.     triazolam (HALCION) 0.125 MG tablet Take 1 tablet (0.125 mg total) by mouth once for 1 dose. Take prior to MRI 1 tablet 0   No current facility-administered medications on file prior to visit.    Allergies  Allergen Reactions   Other Other (See Comments)    Seasonal allergies    Family History  Problem Relation Age of Onset   Heart disease Mother        No details    Alcohol abuse Mother    Drug abuse Mother    Sickle cell anemia Mother    Heart disease Father        No details . Questionable bypass or stent   Drug abuse Father    Alcohol abuse Father    Colon cancer Maternal Aunt    Esophageal cancer Neg Hx    Pancreatic cancer Neg Hx    Stomach cancer Neg Hx     BP 120/74 (BP Location: Left Arm, Patient Position: Sitting, Cuff Size: Normal)    Pulse 70    Ht 5\' 3"  (1.6 m)    Wt 154 lb 6.4 oz (70 kg)    LMP  (LMP Unknown)    SpO2 99%    BMI 27.35 kg/m    Review of Systems Denies HA and visual loss.      Objective:   Physical Exam   Lab Results  Component Value Date   TSH 3.10 03/05/2021      Assessment & Plan:  Pituitary adenoma.  Euthyroid off rx.  No thyroid medication is needed.  We discussed f/u.  We can skip MRI this year  Patient Instructions  Blood tests are requested for you today.  We'll let you know about the results.   We can skip the MRI this year.   Please come back for a follow-up appointment in 1 year.

## 2021-03-05 NOTE — Patient Instructions (Addendum)
Blood tests are requested for you today.  We'll let you know about the results.   We can skip the MRI this year.   Please come back for a follow-up appointment in 1 year.

## 2021-03-08 ENCOUNTER — Other Ambulatory Visit: Payer: Self-pay

## 2021-03-08 ENCOUNTER — Telehealth: Payer: Self-pay | Admitting: Gastroenterology

## 2021-03-08 MED ORDER — TRULANCE 3 MG PO TABS: 1.0000 | ORAL_TABLET | Freq: Every day | ORAL | 6 refills | Status: DC

## 2021-03-08 NOTE — Telephone Encounter (Signed)
Patient called and stated that she only has 1 pill left of Trulance. Please advise.

## 2021-03-08 NOTE — Telephone Encounter (Signed)
Nancy Herman is working the best of all the medicines she has tried to date. "Not perfect, but okay. Some days I don't have a bowel movement. Most days I have something." Rx sent to the pharmacy. Patient will connect with the pharmacy to see if she can afford the medication.

## 2021-03-12 ENCOUNTER — Telehealth: Payer: Self-pay | Admitting: Gastroenterology

## 2021-03-12 NOTE — Telephone Encounter (Signed)
Patient called states the Trulance medication is costing her $100.00 and she can not get it.

## 2021-03-12 NOTE — Telephone Encounter (Signed)
Discussed with pt that she can take the linzess in the am at least 30 min before eating.

## 2021-03-12 NOTE — Telephone Encounter (Signed)
Patient stated that she picked her her Linzess and it does not have the instructions on when to take it. Patient is seeking advice, please advise.

## 2021-03-12 NOTE — Telephone Encounter (Signed)
Returned patients call. She does not want Trulance, Offered her patient assistance.  She said Linzess 145 mcg works better for her, so I will do a PAP through [employer]. She is bringing insurance cards and copy of pay

## 2021-03-13 ENCOUNTER — Telehealth: Payer: Self-pay | Admitting: Family Medicine

## 2021-03-13 DIAGNOSIS — R239 Unspecified skin changes: Secondary | ICD-10-CM

## 2021-03-13 NOTE — Telephone Encounter (Signed)
Referral has been placed. I left pt a voicemail letting her know, they will reach out to her to schedule the appointment.

## 2021-03-13 NOTE — Telephone Encounter (Signed)
Patient called to get recommendations for dermatologist. She is having a skin problem on her face, but she has not been seen for it yet. Patient would prefer dermatology offices in [place].      Good callback number is [phone]    Please advise

## 2021-03-19 NOTE — Telephone Encounter (Signed)
Patient called to follow up on referral being sent in. She stated that she had not heard anything from the dermatology office. I let her know that the referral had been placed but it was still being worked on. Patient would like a call back to discuss the time frame for dermatology office to call her.    Please advise

## 2021-03-19 NOTE — Telephone Encounter (Signed)
Hi! Can you check on this referral for me?

## 2021-03-19 NOTE — Telephone Encounter (Signed)
I called and spoke with patient, she is aware of the wait. Phone number to office given to patient to call if she doesn't hear from them in the next few days.

## 2021-03-29 NOTE — Telephone Encounter (Signed)
Inbound call from patient. States she would like a call back to discuss the process of the program to get Linzess for a cheaper price and in the meantime if she could receive more samples.

## 2021-03-30 ENCOUNTER — Telehealth: Payer: Self-pay | Admitting: Gastroenterology

## 2021-03-30 NOTE — Telephone Encounter (Signed)
Patient called again inquiring about the [place] Patient Assistance Program to see if she'd be able to qualify for that as she cannot afford the Linzess otherwise.  She has ran out of the samples we have provided for her.  She wants to know what her next steps are and how to proceed.  Please advise.  Thank you.

## 2021-03-30 NOTE — Telephone Encounter (Signed)
Patient called and stated that she is calling because she never heard from anyone from the message that was sent yesterday. Please advise.

## 2021-04-02 NOTE — Telephone Encounter (Signed)
Inbound call from patient follow up on below message

## 2021-04-02 NOTE — Telephone Encounter (Signed)
Spoke with patient today about her Linzess 290 mcg, We had faxed application for 374 mcg on 02/07/2021 but patient decided that it did not work for her. So we done another PAP for patient for the 290 mcg Linzess.Everything faxed on 03/12/2021 but Dina Rich said they did not have her application. Refaxed today and offered pt samples until we ger a response from [place].    See other phone note

## 2021-04-02 NOTE — Telephone Encounter (Signed)
Spoke with patient today about her Linzess 290 mcg, We had faxed application for 175 mcg on 02/07/2021 but patient decided that it did not work for her. So we done another PAP for patient for the 290 mcg Linzess.Everything faxed on 03/12/2021 but Dina Rich said they did not have her application. Refaxed today and offered pt samples until we ger a response from [place].

## 2021-04-02 NOTE — Telephone Encounter (Signed)
Called the patient. She said someone had already gotten her income information. She is checking on when she will get something on the Linzess. She is out of medicine.

## 2021-04-03 ENCOUNTER — Other Ambulatory Visit: Payer: Self-pay | Admitting: Family Medicine

## 2021-04-03 ENCOUNTER — Ambulatory Visit (HOSPITAL_BASED_OUTPATIENT_CLINIC_OR_DEPARTMENT_OTHER): Payer: Medicare HMO | Admitting: Psychiatry

## 2021-04-03 ENCOUNTER — Other Ambulatory Visit: Payer: Self-pay

## 2021-04-03 DIAGNOSIS — F251 Schizoaffective disorder, depressive type: Secondary | ICD-10-CM | POA: Diagnosis not present

## 2021-04-03 DIAGNOSIS — J309 Allergic rhinitis, unspecified: Secondary | ICD-10-CM

## 2021-04-03 DIAGNOSIS — F411 Generalized anxiety disorder: Secondary | ICD-10-CM | POA: Diagnosis not present

## 2021-04-03 DIAGNOSIS — F32A Depression, unspecified: Secondary | ICD-10-CM

## 2021-04-03 MED ORDER — RISPERIDONE 0.5 MG PO TABS: ORAL_TABLET | ORAL | 3 refills | Status: DC

## 2021-04-03 MED ORDER — FLUOXETINE HCL 20 MG PO CAPS: 20.0000 mg | ORAL_CAPSULE | Freq: Every day | ORAL | 5 refills | Status: DC

## 2021-04-03 NOTE — Progress Notes (Signed)
Psychiatric Initial Adult Assessment   Patient Identification: Nancy Herman MRN:  [id number] Date of Evaluation:  04/03/2021 Referral Source: Dr. Betty Martinique Chief Complaint:     Visit Diagnosis:    ICD-10-CM   1. Depressive disorder  F32.A FLUoxetine (PROZAC) 20 MG capsule    2. Generalized anxiety disorder  F41.1 FLUoxetine (PROZAC) 20 MG capsule       History of Present Illness:   Today the patient is improved.  She no longer is hearing voices.  She is much more comfortable.  She also had some dental surgery and no longer has dental pain.  She still deals with irritable bowel medicines that are prescribed are actually too expensive.  She denies any chest pain or shortness of breath.  Overall her health is stable.  Financially she is doing okay.  She has a few good neighbors.  The patient is going back to church a lot.  She likes to read she reads the Bible a lot.  She likes the soap operas and games sugars.  She keeps herself busy.  She also has a little dog that she spends a lot of time.  But no longer she is hearing voices or seeing visions.  She is not paranoid.  She has 2 adult children who are doing well and 5 grandchildren who are well.  Her appetite is good and her energy level is good.  She denies anxiety.  She is much better much more stable now.   Depression Symptoms:  difficulty concentrating, (Hypo) Manic Symptoms:   Anxiety Symptoms:   Psychotic Symptoms:   PTSD Symptoms:   Past Psychiatric History: Prozac for many years  Previous Psychotropic Medications:   Substance Abuse History in the last 12 months:  No.  Consequences of Substance Abuse:   Past Medical History:  Past Medical History:  Diagnosis Date   Anxiety    Frequent headaches    GERD (gastroesophageal reflux disease)    Hyperlipidemia    IBS (irritable bowel syndrome)    Pituitary insufficiency (HCC)    Thyroid disease     Past Surgical History:  Procedure Laterality Date    ABDOMINAL HYSTERECTOMY  1990   ANAL RECTAL MANOMETRY N/A 08/14/2018   Procedure: ANO RECTAL MANOMETRY;  Surgeon: Mauri Pole, MD;  Location: [facility];  Service: Endoscopy;  Laterality: N/A;   BRAIN SURGERY     BREAST EXCISIONAL BIOPSY Right unsure   BREAST EXCISIONAL BIOPSY Left unsure   OVARIAN CYST SURGERY  1994   REPAIR RECTOCELE  2016   and prolapsed uterus.   TONSILLECTOMY  1973    Family Psychiatric History:   Family History:  Family History  Problem Relation Age of Onset   Heart disease Mother        No details    Alcohol abuse Mother    Drug abuse Mother    Sickle cell anemia Mother    Heart disease Father        No details . Questionable bypass or stent   Drug abuse Father    Alcohol abuse Father    Colon cancer Maternal Aunt    Esophageal cancer Neg Hx    Pancreatic cancer Neg Hx    Stomach cancer Neg Hx     Social History:   Social History   Socioeconomic History   Marital status: Widowed    Spouse name: Not on file   Number of children: 2   Years of education: Not on file   Highest  education level: Not on file  Occupational History   Occupation: retired  Tobacco Use   Smoking status: Never   Smokeless tobacco: Never  Vaping Use   Vaping Use: Never used  Substance and Sexual Activity   Alcohol use: No   Drug use: No   Sexual activity: Never  Other Topics Concern   Not on file  Social History Narrative   Lives alone.  Lives near son.  Was in [place].   Social Determinants of Health   Financial Resource Strain: Low Risk    Difficulty of Paying Living Expenses: Not hard at all  Food Insecurity: No Food Insecurity   Worried About Charity fundraiser in the Last Year: Never true   [place] in the Last Year: Never true  Transportation Needs: No Transportation Needs   Lack of Transportation (Medical): No   Lack of Transportation (Non-Medical): No  Physical Activity: Inactive   Days of Exercise per Week: 0 days   Minutes of Exercise  per Session: 0 min  Stress: No Stress Concern Present   Feeling of Stress : Not at all  Social Connections: Moderately Isolated   Frequency of Communication with Friends and Family: More than three times a week   Frequency of Social Gatherings with Friends and Family: Once a week   Attends Religious Services: 1 to 4 times per year   Active Member of [employer] or Organizations: No   Attends Archivist Meetings: Never   Marital Status: Widowed    Additional Social History:   Allergies:   Allergies  Allergen Reactions   Other Other (See Comments)    Seasonal allergies    Metabolic Disorder Labs: Lab Results  Component Value Date   HGBA1C 5.6 05/09/2020   Lab Results  Component Value Date   PROLACTIN 21.4 02/28/2020   PROLACTIN 14.6 03/05/2019   Lab Results  Component Value Date   CHOL 212 (H) 11/08/2020   TRIG 52.0 11/08/2020   HDL 90.20 11/08/2020   CHOLHDL 2 11/08/2020   VLDL 10.4 11/08/2020   LDLCALC 111 (H) 11/08/2020   LDLCALC 112 (H) 11/12/2019   Lab Results  Component Value Date   TSH 3.10 03/05/2021    Therapeutic Level Labs: No results found for: LITHIUM No results found for: CBMZ No results found for: VALPROATE  Current Medications: Current Outpatient Medications  Medication Sig Dispense Refill   carbamide peroxide (DEBROX) 6.5 % OTIC solution Place 5 drops into the left ear 2 (two) times daily. 15 mL 0   cetirizine (ZYRTEC) 10 MG tablet Take 10 mg by mouth daily.     esomeprazole (NEXIUM) 40 MG capsule Take 1 capsule (40 mg total) by mouth daily. 30 capsule 3   FLUoxetine (PROZAC) 20 MG capsule Take 1 capsule (20 mg total) by mouth daily. 30 capsule 5   ipratropium (ATROVENT) 0.06 % nasal spray Place 2 sprays into both nostrils 4 (four) times daily. 15 mL 6   montelukast (SINGULAIR) 10 MG tablet TAKE 1 TABLET BY MOUTH AT BEDTIME 90 tablet 2   Plecanatide (TRULANCE) 3 MG TABS Take 1 tablet by mouth daily. 30 tablet 6   polyethylene glycol  (MIRALAX) packet Take 17 g by mouth daily. 14 each 0   pravastatin (PRAVACHOL) 20 MG tablet Take 1 tablet (20 mg total) by mouth daily. 90 tablet 3   risperiDONE (RISPERDAL) 0.5 MG tablet 1  Bid 60 tablet 3   TURMERIC PO Take by mouth.  No current facility-administered medications for this visit.    Musculoskeletal: Strength & Muscle Tone: within normal limits Gait & Station: normal Patient leans:   Psychiatric Specialty Exam: ROS  There were no vitals taken for this visit.There is no height or weight on file to calculate BMI.  General Appearance: Casual  Eye Contact:  Good  Speech: Good  Volume:  Normal  Mood:  NA  Affect:  NA and Appropriate  Thought Process:  Coherent  Orientation:  Full (Time, Place, and Person)  Thought Content:  Logical  Suicidal Thoughts:  No  Homicidal Thoughts:  No  Memory:  Negative  Judgement:  Good  Insight:  Fair  Psychomotor Activity:  Normal  Concentration:    Recall:  Good  Fund of Knowledge:Good  Language: Good  Akathisia:  No  Handed:    AIMS (if indicated):  not done  Assets:  Desire for Improvement  ADL's:  Intact  Cognition: WNL  Sleep:  Good   Screenings: PHQ2-9    Flowsheet Row Clinical Support from 11/14/2020 in [facility] at [facility] from 04/11/2020 in [facility] from 11/10/2019 in [facility] from 11/09/2018 in [facility] from 12/03/2017 in [facility] at [facility]  PHQ-2 Total Score 0 0 1 0 0      [facility] from 03/16/2018 in [facility] from 02/22/2018 in [facility]  C-SSRS RISK CATEGORY No Risk No Risk       Assessment and Plan:  2/28/20232:39 PM   I believe the most likely diagnosis for this patient is schizo affective disorder depressed type.  She has profound significant  auditory hallucinations without much mood problems.  She continues taking Prozac throughout this period of illness.  Once we increased her Risperdal back to 0.5 mg twice daily her psychosis went away.  At this time I believe this dose Risperdal 0.5 mg twice daily is the lowest effective dose.  Today the patient had an aims scale that showed no evidence of tardive dyskinesia.  The patient is well controlled hypercholesterolemia and she has no diabetes.  I think the persistent use of Risperdal makes the most sense together with 20 mg of Prozac.  The patient is functioning extremely well.  She drinks no alcohol and uses no drugs.  She still drives.  This patient will return to see me in 4 months.

## 2021-04-11 NOTE — Telephone Encounter (Signed)
Approved for Linzess 290 mcg with [facility] program until 02/03/2022 ?

## 2021-04-11 NOTE — Telephone Encounter (Addendum)
Patient wants phone number to see how long its going to take for her to get the [facility]   [phone] sent approval to be scanned in ? ? ?

## 2021-04-30 DIAGNOSIS — H25013 Cortical age-related cataract, bilateral: Secondary | ICD-10-CM | POA: Diagnosis not present

## 2021-04-30 DIAGNOSIS — H524 Presbyopia: Secondary | ICD-10-CM | POA: Diagnosis not present

## 2021-04-30 DIAGNOSIS — H2513 Age-related nuclear cataract, bilateral: Secondary | ICD-10-CM | POA: Diagnosis not present

## 2021-04-30 DIAGNOSIS — H02834 Dermatochalasis of left upper eyelid: Secondary | ICD-10-CM | POA: Diagnosis not present

## 2021-04-30 DIAGNOSIS — H02831 Dermatochalasis of right upper eyelid: Secondary | ICD-10-CM | POA: Diagnosis not present

## 2021-04-30 DIAGNOSIS — H40013 Open angle with borderline findings, low risk, bilateral: Secondary | ICD-10-CM | POA: Diagnosis not present

## 2021-05-03 DIAGNOSIS — L811 Chloasma: Secondary | ICD-10-CM | POA: Diagnosis not present

## 2021-05-03 DIAGNOSIS — L821 Other seborrheic keratosis: Secondary | ICD-10-CM | POA: Diagnosis not present

## 2021-05-11 DIAGNOSIS — Z008 Encounter for other general examination: Secondary | ICD-10-CM | POA: Diagnosis not present

## 2021-05-18 DIAGNOSIS — H02831 Dermatochalasis of right upper eyelid: Secondary | ICD-10-CM | POA: Insufficient documentation

## 2021-05-18 DIAGNOSIS — H02834 Dermatochalasis of left upper eyelid: Secondary | ICD-10-CM | POA: Diagnosis not present

## 2021-05-21 ENCOUNTER — Other Ambulatory Visit: Payer: Self-pay | Admitting: Family Medicine

## 2021-08-01 ENCOUNTER — Ambulatory Visit (HOSPITAL_BASED_OUTPATIENT_CLINIC_OR_DEPARTMENT_OTHER): Payer: Medicare HMO | Admitting: Psychiatry

## 2021-08-01 DIAGNOSIS — F325 Major depressive disorder, single episode, in full remission: Secondary | ICD-10-CM

## 2021-08-01 DIAGNOSIS — F411 Generalized anxiety disorder: Secondary | ICD-10-CM

## 2021-08-01 DIAGNOSIS — F32A Depression, unspecified: Secondary | ICD-10-CM

## 2021-08-01 MED ORDER — FLUOXETINE HCL 20 MG PO CAPS: 20.0000 mg | ORAL_CAPSULE | Freq: Every day | ORAL | 5 refills | Status: DC

## 2021-08-01 MED ORDER — RISPERIDONE 0.5 MG PO TABS: ORAL_TABLET | ORAL | 3 refills | Status: DC

## 2021-08-01 NOTE — Progress Notes (Signed)
Psychiatric Initial Adult Assessment   Patient Identification: Nancy Herman MRN:  [id number] Date of Evaluation:  08/01/2021 Referral Source: Dr. Betty Martinique Chief Complaint:     Visit Diagnosis:    ICD-10-CM   1. Depressive disorder  F32.A FLUoxetine (PROZAC) 20 MG capsule    2. Generalized anxiety disorder  F41.1 FLUoxetine (PROZAC) 20 MG capsule       History of Present Illness:      Today the patient is doing well.  She drove here without a problem.  Her life is good.  She lives alone.  Her family is stable.  Her health is good.  Financially she is struggling like a lot of people.  She has a 91-year-old puppy which is her biggest stress.  Patient stays active.  She goes to meetings.  She goes to [employer] at [employer] on a regular basis.  She does not exercise very much.  She likes her home.  She is no longer hearing voices or seeing visions.  Her mood is good.  She has no paranoia.  Today she had an aims scale that demonstrated no evidence of tardive dyskinesia.  In efforts to reduce her Risperdal she clearly had a decline and therefore 0.5 mg twice daily will be the continuous dose that she has.  Possibly consider lowering it in years for now but for now this seems to be the lowest effective dose.  Her next visit we will go over some of the labs that should be associated with Risperdal treatment.  She also continue taking Prozac as prescribed.  Her mood is good and she is functioning very well.   Depression Symptoms:  difficulty concentrating, (Hypo) Manic Symptoms:   Anxiety Symptoms:   Psychotic Symptoms:   PTSD Symptoms:   Past Psychiatric History: Prozac for many years  Previous Psychotropic Medications:   Substance Abuse History in the last 12 months:  No.  Consequences of Substance Abuse:   Past Medical History:  Past Medical History:  Diagnosis Date   Anxiety    Frequent headaches    GERD (gastroesophageal reflux disease)    Hyperlipidemia    IBS  (irritable bowel syndrome)    Pituitary insufficiency (HCC)    Thyroid disease     Past Surgical History:  Procedure Laterality Date   ABDOMINAL HYSTERECTOMY  1990   ANAL RECTAL MANOMETRY N/A 08/14/2018   Procedure: ANO RECTAL MANOMETRY;  Surgeon: Mauri Pole, MD;  Location: [facility];  Service: Endoscopy;  Laterality: N/A;   BRAIN SURGERY     BREAST EXCISIONAL BIOPSY Right unsure   BREAST EXCISIONAL BIOPSY Left unsure   OVARIAN CYST SURGERY  1994   REPAIR RECTOCELE  2016   and prolapsed uterus.   TONSILLECTOMY  1973    Family Psychiatric History:   Family History:  Family History  Problem Relation Age of Onset   Heart disease Mother        No details    Alcohol abuse Mother    Drug abuse Mother    Sickle cell anemia Mother    Heart disease Father        No details . Questionable bypass or stent   Drug abuse Father    Alcohol abuse Father    Colon cancer Maternal Aunt    Esophageal cancer Neg Hx    Pancreatic cancer Neg Hx    Stomach cancer Neg Hx     Social History:   Social History   Socioeconomic History   Marital  status: Widowed    Spouse name: Not on file   Number of children: 2   Years of education: Not on file   Highest education level: Not on file  Occupational History   Occupation: retired  Tobacco Use   Smoking status: Never   Smokeless tobacco: Never  Vaping Use   Vaping Use: Never used  Substance and Sexual Activity   Alcohol use: No   Drug use: No   Sexual activity: Never  Other Topics Concern   Not on file  Social History Narrative   Lives alone.  Lives near son.  Was in [place].   Social Determinants of Health   Financial Resource Strain: Low Risk  (11/14/2020)   Overall Financial Resource Strain (CARDIA)    Difficulty of Paying Living Expenses: Not hard at all  Food Insecurity: No Food Insecurity (11/14/2020)   Hunger Vital Sign    Worried About Running Out of Food in the Last Year: Never true    Ran Out of Food in the Last  Year: Never true  Transportation Needs: No Transportation Needs (11/14/2020)   PRAPARE - Hydrologist (Medical): No    Lack of Transportation (Non-Medical): No  Physical Activity: Inactive (11/14/2020)   Exercise Vital Sign    Days of Exercise per Week: 0 days    Minutes of Exercise per Session: 0 min  Stress: No Stress Concern Present (11/14/2020)   [place]    Feeling of Stress : Not at all  Social Connections: Moderately Isolated (11/14/2020)   Social Connection and Isolation Panel [NHANES]    Frequency of Communication with Friends and Family: More than three times a week    Frequency of Social Gatherings with Friends and Family: Once a week    Attends Religious Services: 1 to 4 times per year    Active Member of [employer] or Organizations: No    Attends Archivist Meetings: Never    Marital Status: Widowed    Additional Social History:   Allergies:   Allergies  Allergen Reactions   Other Other (See Comments)    Seasonal allergies    Metabolic Disorder Labs: Lab Results  Component Value Date   HGBA1C 5.6 05/09/2020   Lab Results  Component Value Date   PROLACTIN 21.4 02/28/2020   PROLACTIN 14.6 03/05/2019   Lab Results  Component Value Date   CHOL 212 (H) 11/08/2020   TRIG 52.0 11/08/2020   HDL 90.20 11/08/2020   CHOLHDL 2 11/08/2020   VLDL 10.4 11/08/2020   LDLCALC 111 (H) 11/08/2020   LDLCALC 112 (H) 11/12/2019   Lab Results  Component Value Date   TSH 3.10 03/05/2021    Therapeutic Level Labs: No results found for: "LITHIUM" No results found for: "CBMZ" No results found for: "VALPROATE"  Current Medications: Current Outpatient Medications  Medication Sig Dispense Refill   carbamide peroxide (DEBROX) 6.5 % OTIC solution Place 5 drops into the left ear 2 (two) times daily. 15 mL 0   cetirizine (ZYRTEC) 10 MG tablet Take 10 mg by mouth daily.      esomeprazole (NEXIUM) 40 MG capsule Take 1 capsule by mouth once daily 90 capsule 1   FLUoxetine (PROZAC) 20 MG capsule Take 1 capsule (20 mg total) by mouth daily. 30 capsule 5   ipratropium (ATROVENT) 0.06 % nasal spray Place 2 sprays into both nostrils 4 (four) times daily. 15 mL 6   montelukast (SINGULAIR)  10 MG tablet TAKE 1 TABLET BY MOUTH AT BEDTIME 90 tablet 3   Plecanatide (TRULANCE) 3 MG TABS Take 1 tablet by mouth daily. 30 tablet 6   polyethylene glycol (MIRALAX) packet Take 17 g by mouth daily. 14 each 0   pravastatin (PRAVACHOL) 20 MG tablet Take 1 tablet (20 mg total) by mouth daily. 90 tablet 3   risperiDONE (RISPERDAL) 0.5 MG tablet 1  Bid 60 tablet 3   TURMERIC PO Take by mouth.     No current facility-administered medications for this visit.    Musculoskeletal: Strength & Muscle Tone: within normal limits Gait & Station: normal Patient leans:   Psychiatric Specialty Exam: ROS  There were no vitals taken for this visit.There is no height or weight on file to calculate BMI.  General Appearance: Casual  Eye Contact:  Good  Speech: Good  Volume:  Normal  Mood:  NA  Affect:  NA and Appropriate  Thought Process:  Coherent  Orientation:  Full (Time, Place, and Person)  Thought Content:  Logical  Suicidal Thoughts:  No  Homicidal Thoughts:  No  Memory:  Negative  Judgement:  Good  Insight:  Fair  Psychomotor Activity:  Normal  Concentration:    Recall:  Good  Fund of Knowledge:Good  Language: Good  Akathisia:  No  Handed:    AIMS (if indicated):  not done  Assets:  Desire for Improvement  ADL's:  Intact  Cognition: WNL  Sleep:  Good   Screenings: PHQ2-9    Flowsheet Row Clinical Support from 11/14/2020 in [facility] at [facility] from 04/11/2020 in [facility] from 11/10/2019 in [facility] from 11/09/2018 in [facility] at [facility] from 12/03/2017 in [facility]  PHQ-2 Total Score 0 0 1 0 0      [facility] from 03/16/2018 in [facility] from 02/22/2018 in [facility]  C-SSRS RISK CATEGORY No Risk No Risk       Assessment and Plan:  6/28/20231:34 PM    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  She will continue taking Prozac 20 mg and continue Risperdal 0.5 mg twice daily.  The patient is in remission.  She is doing great.  She functions independently.  She has all her basic and instrumental ADLs.  She drove here today.  She will return to see me in 4 months.

## 2021-08-03 DIAGNOSIS — L811 Chloasma: Secondary | ICD-10-CM | POA: Diagnosis not present

## 2021-08-22 ENCOUNTER — Other Ambulatory Visit: Payer: Self-pay | Admitting: Family Medicine

## 2021-08-22 DIAGNOSIS — Z1231 Encounter for screening mammogram for malignant neoplasm of breast: Secondary | ICD-10-CM

## 2021-08-27 DIAGNOSIS — H02831 Dermatochalasis of right upper eyelid: Secondary | ICD-10-CM | POA: Diagnosis not present

## 2021-08-27 DIAGNOSIS — H02832 Dermatochalasis of right lower eyelid: Secondary | ICD-10-CM | POA: Diagnosis not present

## 2021-08-27 DIAGNOSIS — H02834 Dermatochalasis of left upper eyelid: Secondary | ICD-10-CM | POA: Diagnosis not present

## 2021-08-27 HISTORY — PX: OTHER SURGICAL HISTORY: SHX169

## 2021-08-28 ENCOUNTER — Encounter: Payer: Self-pay | Admitting: Family Medicine

## 2021-08-28 ENCOUNTER — Ambulatory Visit (INDEPENDENT_AMBULATORY_CARE_PROVIDER_SITE_OTHER): Payer: Medicare HMO | Admitting: Family Medicine

## 2021-08-28 ENCOUNTER — Telehealth: Payer: Self-pay

## 2021-08-28 ENCOUNTER — Ambulatory Visit: Payer: Medicare HMO | Admitting: Family Medicine

## 2021-08-28 VITALS — BP 128/76 | HR 61 | Temp 97.6°F | Resp 16 | Ht 63.0 in | Wt 156.0 lb

## 2021-08-28 DIAGNOSIS — R944 Abnormal results of kidney function studies: Secondary | ICD-10-CM

## 2021-08-28 DIAGNOSIS — N907 Vulvar cyst: Secondary | ICD-10-CM | POA: Diagnosis not present

## 2021-08-28 DIAGNOSIS — N764 Abscess of vulva: Secondary | ICD-10-CM

## 2021-08-28 MED ORDER — SULFAMETHOXAZOLE-TRIMETHOPRIM 800-160 MG PO TABS: 1.0000 | ORAL_TABLET | Freq: Two times a day (BID) | ORAL | 0 refills | Status: AC

## 2021-08-28 NOTE — Telephone Encounter (Signed)
Caller had a bump on her bikini area that burst yesterday and there was some blood. Should she come in to have it looked at? Boil erupted yesterday and is still draining today. No other s/s  08/28/2021 9:57:14 AM See PCP within 24 Hours Em[place], RN, Ronalee Belts  Referrals REFERRED TO PCP OFFICE  Pt has appt with PCP today.

## 2021-08-28 NOTE — Patient Instructions (Signed)
A few things to remember from today's visit:  Abscess of labia majora - Plan: sulfamethoxazole-trimethoprim (BACTRIM DS) 800-160 MG tablet  Sebaceous cyst of labia  Abnormal renal function test - Plan: Basic metabolic panel  If you need refills please call your pharmacy. Do not use My Chart to request refills or for acute issues that need immediate attention.   Warm compresses. Start antibiotic treatment. Monitor for fever.  Please be sure medication list is accurate. If a new problem present, please set up appointment sooner than planned today.  Epidermoid Cyst  An epidermoid cyst, also called an epidermal cyst, is a small lump under your skin. The cyst contains a substance called keratin. Do not try to pop or open the cyst yourself. What are the causes? A blocked hair follicle. A hair that curls and re-enters the skin instead of growing straight out of the skin. A blocked pore. Irritated skin. An injury to the skin. Certain conditions that are passed along from parent to child. Human papillomavirus (HPV). This happens rarely when cysts occur on the bottom of the feet. Long-term sun damage to the skin. What increases the risk? Having acne. Being female. Having an injury to the skin. Being past puberty. Having certain conditions caused by genes (genetic disorder) What are the signs or symptoms? These cysts are usually harmless, but they can get infected. Symptoms of infection may include: Redness. Inflammation. Tenderness. Warmth. Fever. A bad-smelling substance that drains from the cyst. Pus that drains from the cyst. How is this treated? In many cases, epidermoid cysts go away on their own without treatment. If a cyst becomes infected, treatment may include: Opening and draining the cyst, done by a doctor. After draining, you may need minor surgery to remove the rest of the cyst. Antibiotic medicine. Shots of medicines (steroids) that help to reduce  inflammation. Surgery to remove the cyst. Surgery may be done if the cyst: Becomes large. Bothers you. Has a chance of turning into cancer. Do not try to open a cyst yourself. Follow these instructions at home: Medicines Take over-the-counter and prescription medicines as told by your doctor. If you were prescribed an antibiotic medicine, take it as told by your doctor. Do not stop taking it even if you start to feel better. General instructions Keep the area around your cyst clean and dry. Wear loose, dry clothing. Avoid touching your cyst. Check your cyst every day for signs of infection. Check for: Redness, swelling, or pain. Fluid or blood. Warmth. Pus or a bad smell. Keep all follow-up visits. How is this prevented? Wear clean, dry, clothing. Avoid wearing tight clothing. Keep your skin clean and dry. Take showers or baths every day. Contact a doctor if: Your cyst has symptoms of infection. Your condition does not improve or gets worse. You have a cyst that looks different from other cysts you have had. You have a fever. Get help right away if: Redness spreads from the cyst into the area close by. Summary An epidermoid cyst is a small lump under your skin. If a cyst becomes infected, treatment may include surgery to open and drain the cyst, or to remove it. Take over-the-counter and prescription medicines only as told by your doctor. Contact a doctor if your condition is not improving or is getting worse. Keep all follow-up visits. This information is not intended to replace advice given to you by your health care provider. Make sure you discuss any questions you have with your health care provider. Document Revised:  04/28/2019 Document Reviewed: 04/28/2019 Elsevier Patient Education  [place].

## 2021-08-28 NOTE — Progress Notes (Unsigned)
ACUTE VISIT Chief Complaint  Patient presents with   Bump along Groin    Located near the hairline, popped, and had bloody discharge. Popped yesterday.    HPI: Nancy Herman is a 76 y.o. female with hx of GERD,IBS,anxiety,depression,and HLD here today with above complaint. Noted lesion on right upper aspect of right major labia, she thought is has an ingrown hair, so tried to squeeze it, which made it worse. Yesterday she noted sanguinolent and purulent drainage. Negative for fever,chills,changes in appetite,abdominal pain,nausea,or urinary symptoms. She has not taken OTC medications.  Before lesion appeared , for a while she has felt a nodular lesion under skin, not tender.  Since her last visit, she has had bilateral upper eye lid surgery, mild erythema left upper eye lid. No visual changes and no conjunctival erythema or drainage.  Last visit renal function mildly abnormal. No hx of CKD. Negative for gross hematuria,foam in urine,or decreased urine output.  Lab Results  Component Value Date   CREATININE 1.13 11/08/2020   BUN 13 11/08/2020   NA 141 11/08/2020   K 4.1 11/08/2020   CL 105 11/08/2020   CO2 30 11/08/2020   Lab Results  Component Value Date   HGBA1C 5.6 05/09/2020   Review of Systems  Constitutional:  Negative for activity change and unexpected weight change.  HENT:  Negative for mouth sores and nosebleeds.   Respiratory:  Negative for cough, shortness of breath and wheezing.   Cardiovascular:  Negative for chest pain, palpitations and leg swelling.  Gastrointestinal:  Negative for abdominal pain, nausea and vomiting.       Negative for changes in bowel habits.  Neurological:  Negative for syncope, weakness and headaches.  Rest see pertinent positives and negatives per HPI.  Current Outpatient Medications on File Prior to Visit  Medication Sig Dispense Refill   carbamide peroxide (DEBROX) 6.5 % OTIC solution Place 5 drops into the left ear 2  (two) times daily. 15 mL 0   cetirizine (ZYRTEC) 10 MG tablet Take 10 mg by mouth daily.     esomeprazole (NEXIUM) 40 MG capsule Take 1 capsule by mouth once daily 90 capsule 1   FLUoxetine (PROZAC) 20 MG capsule Take 1 capsule (20 mg total) by mouth daily. 30 capsule 5   ipratropium (ATROVENT) 0.06 % nasal spray Place 2 sprays into both nostrils 4 (four) times daily. 15 mL 6   montelukast (SINGULAIR) 10 MG tablet TAKE 1 TABLET BY MOUTH AT BEDTIME 90 tablet 3   Plecanatide (TRULANCE) 3 MG TABS Take 1 tablet by mouth daily. 30 tablet 6   polyethylene glycol (MIRALAX) packet Take 17 g by mouth daily. 14 each 0   pravastatin (PRAVACHOL) 20 MG tablet Take 1 tablet (20 mg total) by mouth daily. 90 tablet 3   risperiDONE (RISPERDAL) 0.5 MG tablet 1  Bid 60 tablet 3   TURMERIC PO Take by mouth.     No current facility-administered medications on file prior to visit.   Past Medical History:  Diagnosis Date   Anxiety    Frequent headaches    GERD (gastroesophageal reflux disease)    Hyperlipidemia    IBS (irritable bowel syndrome)    Pituitary insufficiency (HCC)    Thyroid disease    Allergies  Allergen Reactions   Other Other (See Comments)    Seasonal allergies   Social History   Socioeconomic History   Marital status: Widowed    Spouse name: Not on file   Number of  children: 2   Years of education: Not on file   Highest education level: Not on file  Occupational History   Occupation: retired  Tobacco Use   Smoking status: Never   Smokeless tobacco: Never  Vaping Use   Vaping Use: Never used  Substance and Sexual Activity   Alcohol use: No   Drug use: No   Sexual activity: Never  Other Topics Concern   Not on file  Social History Narrative   Lives alone.  Lives near son.  Was in [place].   Social Determinants of Health   Financial Resource Strain: Low Risk  (11/14/2020)   Overall Financial Resource Strain (CARDIA)    Difficulty of Paying Living Expenses: Not hard at all   Food Insecurity: No Food Insecurity (11/14/2020)   Hunger Vital Sign    Worried About Running Out of Food in the Last Year: Never true    Ran Out of Food in the Last Year: Never true  Transportation Needs: No Transportation Needs (11/14/2020)   PRAPARE - Hydrologist (Medical): No    Lack of Transportation (Non-Medical): No  Physical Activity: Inactive (11/14/2020)   Exercise Vital Sign    Days of Exercise per Week: 0 days    Minutes of Exercise per Session: 0 min  Stress: No Stress Concern Present (11/14/2020)   [place]    Feeling of Stress : Not at all  Social Connections: Moderately Isolated (11/14/2020)   Social Connection and Isolation Panel [NHANES]    Frequency of Communication with Friends and Family: More than three times a week    Frequency of Social Gatherings with Friends and Family: Once a week    Attends Religious Services: 1 to 4 times per year    Active Member of [employer] or Organizations: No    Attends Archivist Meetings: Never    Marital Status: Widowed   Vitals:   08/28/21 1458  BP: 128/76  Pulse: 61  Resp: 16  Temp: 97.6 F (36.4 C)  SpO2: 99%   Body mass index is 27.63 kg/m.  Physical Exam Vitals and nursing note reviewed.  Constitutional:      General: She is not in acute distress.    Appearance: She is well-developed.  HENT:     Head: Normocephalic and atraumatic.  Eyes:     Conjunctiva/sclera: Conjunctivae normal.  Cardiovascular:     Rate and Rhythm: Normal rate and regular rhythm.     Heart sounds: No murmur heard. Pulmonary:     Effort: Pulmonary effort is normal. No respiratory distress.     Breath sounds: Normal breath sounds.  Genitourinary:    Comments: Declined chaperon. Right major labia with nodular lesion draiing whitish matter with odor. Mild erythema, tender. Lymphadenopathy:     Cervical: No cervical adenopathy.   Skin:    General: Skin is warm.     Findings: Abscess present. No rash.     Comments: See GU.  Neurological:     Mental Status: She is alert and oriented to person, place, and time.  Psychiatric:     Comments: Well groomed, good eye contact.   ASSESSMENT AND PLAN:  Nancy Herman was seen today for bump along groin.  Diagnoses and all orders for this visit: Orders Placed This Encounter  Procedures   Basic metabolic panel   Lab Results  Component Value Date   CREATININE 1.15 08/28/2021   BUN 18 08/28/2021  NA 143 08/28/2021   K 4.1 08/28/2021   CL 107 08/28/2021   CO2 29 08/28/2021   Abscess of labia majora Warm compressed. Avoid squeezing it. Keeps area clean with antibacterial soap and water. Bactrim DS started today.. Instructed about warning signs.  -     sulfamethoxazole-trimethoprim (BACTRIM DS) 800-160 MG tablet; Take 1 tablet by mouth 2 (two) times daily for 7 days.  Sebaceous cyst of labia We discussed dx and prognosis. If recurrent infectious process, removal can be considered.   Abnormal renal function test Adequate hydration and avoidance of NSAID's. Further recommendations according to BMP result.  Return if symptoms worsen or fail to improve, for Keep next f/u appt.  Rajan Burgard G. Martinique, MD  [facility]. [place] office.

## 2021-08-29 LAB — BASIC METABOLIC PANEL
BUN: 18 mg/dL (ref 6–23)
CO2: 29 mEq/L (ref 19–32)
Calcium: 9.6 mg/dL (ref 8.4–10.5)
Chloride: 107 mEq/L (ref 96–112)
Creatinine, Ser: 1.15 mg/dL (ref 0.40–1.20)
GFR: 47.09 mL/min — ABNORMAL LOW (ref >60.00)
Glucose, Bld: 94 mg/dL (ref 70–99)
Potassium: 4.1 mEq/L (ref 3.5–5.1)
Sodium: 143 mEq/L (ref 135–145)

## 2021-09-07 ENCOUNTER — Telehealth: Payer: Self-pay | Admitting: Family Medicine

## 2021-09-07 NOTE — Telephone Encounter (Signed)
Pt is calling and when she went to pharm  today they said there was alert on pravastatin (PRAVACHOL) 20 MG tablet . Pt would like to know if safe to take the medication and pharm ask her it she is having and side effect patient said she is not having an side effect that she is aware of. Please advise

## 2021-09-10 NOTE — Telephone Encounter (Signed)
I am not aware of any Pravastatin recall. Usually when there is a problem with a specific medication, the pharmacist can identified those affected and medication is usually replaced.  For now continue Pravastatin, as far as she does not have side effects. Thanks, BJ

## 2021-09-11 NOTE — Telephone Encounter (Signed)
I called and spoke with patient. We went over the information below & she verbalized understanding.  

## 2021-10-09 ENCOUNTER — Ambulatory Visit
Admission: RE | Admit: 2021-10-09 | Discharge: 2021-10-09 | Disposition: A | Discharge status: Home / Self Care | Payer: Medicare HMO | Source: Ambulatory Visit | Attending: Family Medicine | Admitting: Family Medicine

## 2021-10-09 DIAGNOSIS — Z1231 Encounter for screening mammogram for malignant neoplasm of breast: Secondary | ICD-10-CM

## 2021-10-28 ENCOUNTER — Other Ambulatory Visit: Payer: Self-pay | Admitting: Family Medicine

## 2021-10-30 ENCOUNTER — Other Ambulatory Visit: Payer: Self-pay | Admitting: Family Medicine

## 2021-11-07 NOTE — Progress Notes (Signed)
HPI: Nancy Herman is a 76 y.o. female with medical history significant for GERD, depression, anxiety, prediabetes, hyperlipidemia, OA, IBS constipation, and allergies here today for her routine physical.  Last CPE: 11/08/20  She has not been exercising regularly. She cooks at home, she acknowledges that she has not been good with following a healthful diet, she does not eat vegetables daily. She is still active at church. She lives alone, her son calls her once or twice daily.  Immunization History  Administered Date(s) Administered   Fluad Quad(high Dose 65+) 11/09/2018, 11/09/2019, 11/08/2020, 11/09/2021   Influenza, High Dose Seasonal PF 11/06/2015, 10/24/2016, 12/03/2017   PFIZER(Purple Top)SARS-COV-2 Vaccination 02/26/2019, 03/19/2019, 12/04/2019   Pneumococcal Conjugate-13 09/04/2013   Pneumococcal Polysaccharide-23 08/04/2012, 05/10/2019   Zoster Recombinat (Shingrix) 04/04/2017, 06/06/2017, 07/05/2017, 08/13/2017   DEXA 11/15/2019 showed osteopenia. Major Osteoporotic Fracture: 6.5% Hip Fracture:                1.5%  Health Maintenance  Topic Date Due   COVID-19 Vaccine (4 - Pfizer series) 01/29/2020   COLONOSCOPY (Pts 45-86yr Insurance coverage will need to be confirmed)  07/28/2022   MAMMOGRAM  10/10/2023   Pneumonia Vaccine 76 Years old  Completed   INFLUENZA VACCINE  Completed   DEXA SCAN  Completed   Hepatitis C Screening  Completed   Zoster Vaccines- Shingrix  Completed   HPV VACCINES  Aged Out   TETANUS/TDAP  Discontinued   Hyperlipidemia on pravastatin 20 mg daily. She is tolerating medication well. Negative for exertional CP, DOE, or palpitations. She mentions that she has some shortness of breath with bending down to pet her poppy in the morning when she first gets up. She does not have this problem during the day with the same activity.  Lab Results  Component Value Date   CHOL 212 (H) 11/08/2020   HDL 90.20 11/08/2020   LDLCALC 111 (H)  11/08/2020   TRIG 52.0 11/08/2020   CHOLHDL 2 11/08/2020  Exercise tolerance test done in 11/2020: Negative. 85% of maximum heart rate was achieved after 3 minutes. Recovery time:  5 minutes.  The patient's response to exercise was adequate for diagnosis.   Prediabetes: She has not noted polydipsia, polyuria, polyphagia. Hemoglobin A1c 5.93 years ago. Lab Results  Component Value Date   HGBA1C 5.6 05/09/2020   GERD: Occasional heartburn. Currently she is on Nexium 40 mg daily. She follows with gastroenterologist. IBS constipation on Trulance 3 mg daily.  Allergy rhinitis: She is on Singulair 10 mg daily at bedtime. She is no longer on Atrovent nasal spray, it did not seem to be helping much. Postnasal drainage and rhinorrhea intermittently.  Depression and anxiety: She follows with psychiatrist every 3-4 months.  Review of Systems  Constitutional:  Positive for fatigue. Negative for appetite change, chills and fever.  HENT:  Negative for hearing loss, mouth sores and sore throat.   Eyes:  Negative for redness and visual disturbance.  Respiratory:  Negative for cough and wheezing.   Cardiovascular:  Negative for chest pain and leg swelling.  Gastrointestinal:  Negative for abdominal pain, nausea and vomiting.       No changes in bowel habits.  Endocrine: Negative for cold intolerance, heat intolerance, polydipsia, polyphagia and polyuria.  Genitourinary:  Negative for decreased urine volume, dysuria, hematuria, vaginal bleeding and vaginal discharge.  Musculoskeletal:  Positive for arthralgias. Negative for gait problem.  Skin:  Negative for color change and rash.  Allergic/Immunologic: Positive for environmental allergies.  Neurological:  Negative for syncope, weakness and headaches.  Psychiatric/Behavioral:  Negative for confusion. The patient is nervous/anxious.   All other systems reviewed and are negative.  Current Outpatient Medications on File Prior to Visit  Medication  Sig Dispense Refill   carbamide peroxide (DEBROX) 6.5 % OTIC solution Place 5 drops into the left ear 2 (two) times daily. 15 mL 0   cetirizine (ZYRTEC) 10 MG tablet Take 10 mg by mouth daily.     esomeprazole (NEXIUM) 40 MG capsule Take 1 capsule by mouth once daily 90 capsule 0   FLUoxetine (PROZAC) 20 MG capsule Take 1 capsule (20 mg total) by mouth daily. 30 capsule 5   ipratropium (ATROVENT) 0.06 % nasal spray Place 2 sprays into both nostrils 4 (four) times daily. 15 mL 6   montelukast (SINGULAIR) 10 MG tablet TAKE 1 TABLET BY MOUTH AT BEDTIME 90 tablet 3   Plecanatide (TRULANCE) 3 MG TABS Take 1 tablet by mouth daily. 30 tablet 6   polyethylene glycol (MIRALAX) packet Take 17 g by mouth daily. 14 each 0   pravastatin (PRAVACHOL) 20 MG tablet Take 1 tablet by mouth once daily 90 tablet 2   risperiDONE (RISPERDAL) 0.5 MG tablet 1  Bid 60 tablet 3   TURMERIC PO Take by mouth.     No current facility-administered medications on file prior to visit.   Past Medical History:  Diagnosis Date   Anxiety    Frequent headaches    GERD (gastroesophageal reflux disease)    Hyperlipidemia    IBS (irritable bowel syndrome)    Pituitary insufficiency (HCC)    Thyroid disease    Past Surgical History:  Procedure Laterality Date   ABDOMINAL HYSTERECTOMY  1990   ANAL RECTAL MANOMETRY N/A 08/14/2018   Procedure: ANO RECTAL MANOMETRY;  Surgeon: Mauri Pole, MD;  Location: [facility];  Service: Endoscopy;  Laterality: N/A;   BRAIN SURGERY     BREAST EXCISIONAL BIOPSY Right unsure   BREAST EXCISIONAL BIOPSY Left unsure   OVARIAN CYST SURGERY  1994   REPAIR RECTOCELE  2016   and prolapsed uterus.   TONSILLECTOMY  1973   Allergies  Allergen Reactions   Other Other (See Comments)    Seasonal allergies    Family History  Problem Relation Age of Onset   Heart disease Mother        No details    Alcohol abuse Mother    Drug abuse Mother    Sickle cell anemia Mother    Heart  disease Father        No details . Questionable bypass or stent   Drug abuse Father    Alcohol abuse Father    Colon cancer Maternal Aunt    Esophageal cancer Neg Hx    Pancreatic cancer Neg Hx    Stomach cancer Neg Hx    Social History   Socioeconomic History   Marital status: Widowed    Spouse name: Not on file   Number of children: 2   Years of education: Not on file   Highest education level: Not on file  Occupational History   Occupation: retired  Tobacco Use   Smoking status: Never   Smokeless tobacco: Never  Vaping Use   Vaping Use: Never used  Substance and Sexual Activity   Alcohol use: No   Drug use: No   Sexual activity: Never  Other Topics Concern   Not on file  Social History Narrative   Lives alone.  Lives near  son.  Was in [place].   Social Determinants of Health   Financial Resource Strain: Low Risk  (11/14/2020)   Overall Financial Resource Strain (CARDIA)    Difficulty of Paying Living Expenses: Not hard at all  Food Insecurity: No Food Insecurity (11/14/2020)   Hunger Vital Sign    Worried About Running Out of Food in the Last Year: Never true    Ran Out of Food in the Last Year: Never true  Transportation Needs: No Transportation Needs (11/14/2020)   PRAPARE - Hydrologist (Medical): No    Lack of Transportation (Non-Medical): No  Physical Activity: Inactive (11/14/2020)   Exercise Vital Sign    Days of Exercise per Week: 0 days    Minutes of Exercise per Session: 0 min  Stress: No Stress Concern Present (11/14/2020)   [place]    Feeling of Stress : Not at all  Social Connections: Moderately Isolated (11/14/2020)   Social Connection and Isolation Panel [NHANES]    Frequency of Communication with Friends and Family: More than three times a week    Frequency of Social Gatherings with Friends and Family: Once a week    Attends Religious Services: 1 to 4  times per year    Active Member of [employer] or Organizations: No    Attends Archivist Meetings: Never    Marital Status: Widowed   Vitals:   11/09/21 0933  BP: 119/80  Pulse: 68  Resp: 18  Temp: 97.8 F (36.6 C)  SpO2: 96%  Body mass index is 26.7 kg/m.  Wt Readings from Last 3 Encounters:  11/09/21 154 lb 8 oz (70.1 kg)  08/28/21 156 lb (70.8 kg)  03/05/21 154 lb 6.4 oz (70 kg)  Physical Exam Vitals and nursing note reviewed.  Constitutional:      General: She is not in acute distress.    Appearance: She is well-developed.  HENT:     Head: Normocephalic and atraumatic.     Right Ear: External ear normal. Tympanic membrane is not erythematous.     Left Ear: External ear normal.     Ears:     Comments: Cerumen excess L>R, cannot see left TM. Right TM seen partially.    Mouth/Throat:     Mouth: Mucous membranes are moist.     Pharynx: Oropharynx is clear. Uvula midline.  Eyes:     Conjunctiva/sclera: Conjunctivae normal.     Pupils: Pupils are equal, round, and reactive to light.  Neck:     Thyroid: No thyroid mass.  Cardiovascular:     Rate and Rhythm: Normal rate and regular rhythm.     Pulses:          Dorsalis pedis pulses are 2+ on the right side and 2+ on the left side.     Heart sounds: No murmur heard. Pulmonary:     Effort: Pulmonary effort is normal. No respiratory distress.     Breath sounds: Normal breath sounds.  Abdominal:     Palpations: Abdomen is soft. There is no hepatomegaly or mass.     Tenderness: There is no abdominal tenderness.  Genitourinary:    Comments: No concerns. Musculoskeletal:     Comments: No signs of synovitis appreciated.  Lymphadenopathy:     Cervical: No cervical adenopathy.  Skin:    General: Skin is warm.     Findings: No erythema or rash.  Neurological:     General:  No focal deficit present.     Mental Status: She is alert and oriented to person, place, and time.     Cranial Nerves: No cranial nerve deficit.      Coordination: Coordination normal.     Gait: Gait normal.     Deep Tendon Reflexes:     Reflex Scores:      Bicep reflexes are 2+ on the right side and 2+ on the left side.      Patellar reflexes are 2+ on the right side and 2+ on the left side. Psychiatric:        Mood and Affect: Affect normal. Mood is anxious.   ASSESSMENT AND PLAN:  Nancy Herman was here today annual physical examination.  Orders Placed This Encounter  Procedures   Flu Vaccine QUAD High Dose(Fluad)   Comprehensive metabolic panel   Lipid panel   Hemoglobin A1c   Lab Results  Component Value Date   HGBA1C 6.1 11/09/2021   Lab Results  Component Value Date   CHOL 239 (H) 11/09/2021   HDL 88.90 11/09/2021   LDLCALC 140 (H) 11/09/2021   TRIG 50.0 11/09/2021   CHOLHDL 3 11/09/2021   Lab Results  Component Value Date   CREATININE 1.20 11/09/2021   BUN 17 11/09/2021   NA 143 11/09/2021   K 4.8 11/09/2021   CL 106 11/09/2021   CO2 29 11/09/2021   Lab Results  Component Value Date   ALT 13 11/09/2021   AST 18 11/09/2021   ALKPHOS 57 11/09/2021   BILITOT 0.3 11/09/2021   Routine general medical examination at a health care facility We discussed the importance of regular physical activity and healthy diet for prevention of chronic illness and/or complications. Preventive guidelines reviewed. Vaccination up to date. Ca++ and vit D supplementation recommended. Next CPE in a year. The 10-year ASCVD risk score (Arnett DK, et al., 2019) is: 18.9%   Values used to calculate the score:     Age: 76 years     Sex: Female     Is Non-Hispanic African American: Yes     Diabetic: No     Tobacco smoker: No     Systolic Blood Pressure: 373 mmHg     Is BP treated: No     HDL Cholesterol: 88.9 mg/dL     Total Cholesterol: 239 mg/dL  Need for immunization against influenza -     Flu Vaccine QUAD High Dose(Fluad)  Allergic rhinitis Problem otherwise stable. Still having some postnasal  drainage at times. She is no longer on Atrovent nasal spray. Continue Singulair 10 mg daily and nasal saline irrigations.  Prediabetes Consistency with a healthy lifestyle encouraged for diabetes prevention. Further recommendation will be given according to hemoglobin A1c.  Hyperlipidemia Continue pravastatin 20 mg daily. Low-fat diet also recommended. Further recommendation will be given according to lipid panel result.  GERD (gastroesophageal reflux disease) Still having occasional heartburn. Continue Nexium 40 mg daily and GERD precautions. Continue following with GI as needed.  Return in about 6 months (around 05/11/2022).  Arrianna Catala G. Martinique, MD  [facility]. [place] office.

## 2021-11-09 ENCOUNTER — Ambulatory Visit (INDEPENDENT_AMBULATORY_CARE_PROVIDER_SITE_OTHER): Payer: Medicare HMO | Admitting: Family Medicine

## 2021-11-09 ENCOUNTER — Encounter: Payer: Self-pay | Admitting: Family Medicine

## 2021-11-09 VITALS — BP 119/80 | HR 68 | Temp 97.8°F | Resp 18 | Ht 63.78 in | Wt 154.5 lb

## 2021-11-09 DIAGNOSIS — J302 Other seasonal allergic rhinitis: Secondary | ICD-10-CM | POA: Diagnosis not present

## 2021-11-09 DIAGNOSIS — Z0000 Encounter for general adult medical examination without abnormal findings: Secondary | ICD-10-CM

## 2021-11-09 DIAGNOSIS — R7303 Prediabetes: Secondary | ICD-10-CM

## 2021-11-09 DIAGNOSIS — K219 Gastro-esophageal reflux disease without esophagitis: Secondary | ICD-10-CM

## 2021-11-09 DIAGNOSIS — E785 Hyperlipidemia, unspecified: Secondary | ICD-10-CM

## 2021-11-09 DIAGNOSIS — Z23 Encounter for immunization: Secondary | ICD-10-CM

## 2021-11-09 LAB — LIPID PANEL
Cholesterol: 239 mg/dL — ABNORMAL HIGH (ref 0–200)
HDL: 88.9 mg/dL (ref >39.00)
LDL Cholesterol: 140 mg/dL — ABNORMAL HIGH (ref 0–99)
NonHDL: 150.43
Total CHOL/HDL Ratio: 3
Triglycerides: 50 mg/dL (ref 0.0–149.0)
VLDL: 10 mg/dL (ref 0.0–40.0)

## 2021-11-09 LAB — COMPREHENSIVE METABOLIC PANEL
ALT: 13 U/L (ref 0–35)
AST: 18 U/L (ref 0–37)
Albumin: 4.2 g/dL (ref 3.5–5.2)
Alkaline Phosphatase: 57 U/L (ref 39–117)
BUN: 17 mg/dL (ref 6–23)
CO2: 29 mEq/L (ref 19–32)
Calcium: 10 mg/dL (ref 8.4–10.5)
Chloride: 106 mEq/L (ref 96–112)
Creatinine, Ser: 1.2 mg/dL (ref 0.40–1.20)
GFR: 44.68 mL/min — ABNORMAL LOW (ref >60.00)
Glucose, Bld: 89 mg/dL (ref 70–99)
Potassium: 4.8 mEq/L (ref 3.5–5.1)
Sodium: 143 mEq/L (ref 135–145)
Total Bilirubin: 0.3 mg/dL (ref 0.2–1.2)
Total Protein: 7.5 g/dL (ref 6.0–8.3)

## 2021-11-09 LAB — HEMOGLOBIN A1C: Hgb A1c MFr Bld: 6.1 % (ref 4.6–6.5)

## 2021-11-09 NOTE — Assessment & Plan Note (Signed)
Consistency with a healthy lifestyle encouraged for diabetes prevention. Further recommendation will be given according to hemoglobin A1c.

## 2021-11-09 NOTE — Assessment & Plan Note (Addendum)
Problem otherwise stable. Still having some postnasal drainage at times. She is no longer on Atrovent nasal spray. Continue Singulair 10 mg daily and nasal saline irrigations.

## 2021-11-09 NOTE — Assessment & Plan Note (Signed)
Still having occasional heartburn. Continue Nexium 40 mg daily and GERD precautions. Continue following with GI as needed.

## 2021-11-09 NOTE — Patient Instructions (Addendum)
A few things to remember from today's visit:  Routine general medical examination at a health care facility  Hyperlipidemia, unspecified hyperlipidemia type - Plan: Comprehensive metabolic panel, Lipid panel  Gastroesophageal reflux disease, unspecified whether esophagitis present  Seasonal allergic rhinitis, unspecified trigger  Prediabetes - Plan: Hemoglobin A1c  If you need refills for medications you take chronically, please call your pharmacy. Do not use My Chart to request refills or for acute issues that need immediate attention. If you send a my chart message, it may take a few days to be addressed, specially if I am not in the office.  No changes today.  Please be sure medication list is accurate. If a new problem present, please set up appointment sooner than planned today.  A few tips:  -As we age balance is not as good as it was, so there is a higher risks for falls. Please remove small rugs and furniture that is "in your way" and could increase the risk of falls. Stretching exercises may help with fall prevention: Yoga and Tai Chi are some examples. Low impact exercise is better, so you are not very achy the next day.  -Sun screen and avoidance of direct sun light recommended. Caution with dehydration, if working outdoors be sure to drink enough fluids.  - Some medications are not safe as we age, increases the risk of side effects and can potentially interact with other medication you are also taken;  including some of over the counter medications. Be sure to let me know when you start a new medication even if it is a dietary/vitamin supplement.   -Healthy diet low in red meet/animal fat and sugar + regular physical activity is recommended.   Preventive Care 58 Years and Older, Female Preventive care refers to lifestyle choices and visits with your health care provider that can promote health and wellness. Preventive care visits are also called wellness exams. What  can I expect for my preventive care visit? Counseling Your health care provider may ask you questions about your: Medical history, including: Past medical problems. Family medical history. Pregnancy and menstrual history. History of falls. Current health, including: Memory and ability to understand (cognition). Emotional well-being. Home life and relationship well-being. Sexual activity and sexual health. Lifestyle, including: Alcohol, nicotine or tobacco, and drug use. Access to firearms. Diet, exercise, and sleep habits. Work and work Statistician. Sunscreen use. Safety issues such as seatbelt and bike helmet use. Physical exam Your health care provider will check your: Height and weight. These may be used to calculate your BMI (body mass index). BMI is a measurement that tells if you are at a healthy weight. Waist circumference. This measures the distance around your waistline. This measurement also tells if you are at a healthy weight and may help predict your risk of certain diseases, such as type 2 diabetes and high blood pressure. Heart rate and blood pressure. Body temperature. Skin for abnormal spots. What immunizations do I need?  Vaccines are usually given at various ages, according to a schedule. Your health care provider will recommend vaccines for you based on your age, medical history, and lifestyle or other factors, such as travel or where you work. What tests do I need? Screening Your health care provider may recommend screening tests for certain conditions. This may include: Lipid and cholesterol levels. Hepatitis C test. Hepatitis B test. HIV (human immunodeficiency virus) test. STI (sexually transmitted infection) testing, if you are at risk. Lung cancer screening. Colorectal cancer screening. Diabetes screening.  This is done by checking your blood sugar (glucose) after you have not eaten for a while (fasting). Mammogram. Talk with your health care provider  about how often you should have regular mammograms. BRCA-related cancer screening. This may be done if you have a family history of breast, ovarian, tubal, or peritoneal cancers. Bone density scan. This is done to screen for osteoporosis. Talk with your health care provider about your test results, treatment options, and if necessary, the need for more tests. Follow these instructions at home: Eating and drinking  Eat a diet that includes fresh fruits and vegetables, whole grains, lean protein, and low-fat dairy products. Limit your intake of foods with high amounts of sugar, saturated fats, and salt. Take vitamin and mineral supplements as recommended by your health care provider. Do not drink alcohol if your health care provider tells you not to drink. If you drink alcohol: Limit how much you have to 0-1 drink a day. Know how much alcohol is in your drink. In the [place] one drink equals one 12 oz bottle of beer (355 mL), one 5 oz glass of wine (148 mL), or one 1 oz glass of hard liquor (44 mL). Lifestyle Brush your teeth every morning and night with fluoride toothpaste. Floss one time each day. Exercise for at least 30 minutes 5 or more days each week. Do not use any products that contain nicotine or tobacco. These products include cigarettes, chewing tobacco, and vaping devices, such as e-cigarettes. If you need help quitting, ask your health care provider. Do not use drugs. If you are sexually active, practice safe sex. Use a condom or other form of protection in order to prevent STIs. Take aspirin only as told by your health care provider. Make sure that you understand how much to take and what form to take. Work with your health care provider to find out whether it is safe and beneficial for you to take aspirin daily. Ask your health care provider if you need to take a cholesterol-lowering medicine (statin). Find healthy ways to manage stress, such as: Meditation, yoga, or listening to  music. Journaling. Talking to a trusted person. Spending time with friends and family. Minimize exposure to UV radiation to reduce your risk of skin cancer. Safety Always wear your seat belt while driving or riding in a vehicle. Do not drive: If you have been drinking alcohol. Do not ride with someone who has been drinking. When you are tired or distracted. While texting. If you have been using any mind-altering substances or drugs. Wear a helmet and other protective equipment during sports activities. If you have firearms in your house, make sure you follow all gun safety procedures. What's next? Visit your health care provider once a year for an annual wellness visit. Ask your health care provider how often you should have your eyes and teeth checked. Stay up to date on all vaccines. This information is not intended to replace advice given to you by your health care provider. Make sure you discuss any questions you have with your health care provider. Document Revised: 07/19/2020 Document Reviewed: 07/19/2020 Elsevier Patient Education  [place].

## 2021-11-09 NOTE — Assessment & Plan Note (Signed)
Continue pravastatin 20 mg daily. Low-fat diet also recommended. Further recommendation will be given according to lipid panel result.

## 2021-11-13 ENCOUNTER — Other Ambulatory Visit: Payer: Self-pay

## 2021-11-13 MED ORDER — ROSUVASTATIN CALCIUM 20 MG PO TABS: 20.0000 mg | ORAL_TABLET | Freq: Every day | ORAL | 3 refills | Status: DC

## 2021-11-14 ENCOUNTER — Telehealth: Payer: Self-pay | Admitting: Family Medicine

## 2021-11-14 NOTE — Telephone Encounter (Signed)
I called and spoke with patient. We went over her results again and she also asked for me to send her some information on diets to help her cholesterol. Diet handouts mailed to patient's address on file.

## 2021-11-14 NOTE — Telephone Encounter (Signed)
Patient requesting another call to further discuss her cholesterol labs

## 2021-11-27 ENCOUNTER — Ambulatory Visit (INDEPENDENT_AMBULATORY_CARE_PROVIDER_SITE_OTHER): Payer: Medicare HMO

## 2021-11-27 VITALS — Ht 63.0 in | Wt 155.0 lb

## 2021-11-27 DIAGNOSIS — Z0000 Encounter for general adult medical examination without abnormal findings: Secondary | ICD-10-CM

## 2021-11-27 NOTE — Patient Instructions (Signed)
Nancy Herman , Thank you for taking time to come for your Medicare Wellness Visit. I appreciate your ongoing commitment to your health goals. Please review the following plan we discussed and let me know if I can assist you in the future.   Screening recommendations/referrals: Colonoscopy: completed 07/28/2019, due 07/28/2022 Mammogram: completed 10/09/2021, due 10/11/2022 Bone Density: completed 11/15/2019 Recommended yearly ophthalmology/optometry visit for glaucoma screening and checkup Recommended yearly dental visit for hygiene and checkup  Vaccinations: Influenza vaccine: completed 11/09/2021 Pneumococcal vaccine: completed 05/10/2019 Tdap vaccine: n/a Shingles vaccine: completed   Covid-19: 12/04/2019, 03/19/2019, 02/26/2019  Advanced directives: Please bring a copy of your POA (Power of Attorney) and/or Living Will to your next appointment.   Conditions/risks identified: none  Next appointment: Follow up in one year for your annual wellness visit    Preventive Care 65 Years and Older, Female Preventive care refers to lifestyle choices and visits with your health care provider that can promote health and wellness. What does preventive care include? A yearly physical exam. This is also called an annual well check. Dental exams once or twice a year. Routine eye exams. Ask your health care provider how often you should have your eyes checked. Personal lifestyle choices, including: Daily care of your teeth and gums. Regular physical activity. Eating a healthy diet. Avoiding tobacco and drug use. Limiting alcohol use. Practicing safe sex. Taking low-dose aspirin every day. Taking vitamin and mineral supplements as recommended by your health care provider. What happens during an annual well check? The services and screenings done by your health care provider during your annual well check will depend on your age, overall health, lifestyle risk factors, and family history of  disease. Counseling  Your health care provider may ask you questions about your: Alcohol use. Tobacco use. Drug use. Emotional well-being. Home and relationship well-being. Sexual activity. Eating habits. History of falls. Memory and ability to understand (cognition). Work and work Statistician. Reproductive health. Screening  You may have the following tests or measurements: Height, weight, and BMI. Blood pressure. Lipid and cholesterol levels. These may be checked every 5 years, or more frequently if you are over 40 years old. Skin check. Lung cancer screening. You may have this screening every year starting at age 30 if you have a 30-pack-year history of smoking and currently smoke or have quit within the past 15 years. Fecal occult blood test (FOBT) of the stool. You may have this test every year starting at age 90. Flexible sigmoidoscopy or colonoscopy. You may have a sigmoidoscopy every 5 years or a colonoscopy every 10 years starting at age 26. Hepatitis C blood test. Hepatitis B blood test. Sexually transmitted disease (STD) testing. Diabetes screening. This is done by checking your blood sugar (glucose) after you have not eaten for a while (fasting). You may have this done every 1-3 years. Bone density scan. This is done to screen for osteoporosis. You may have this done starting at age 80. Mammogram. This may be done every 1-2 years. Talk to your health care provider about how often you should have regular mammograms. Talk with your health care provider about your test results, treatment options, and if necessary, the need for more tests. Vaccines  Your health care provider may recommend certain vaccines, such as: Influenza vaccine. This is recommended every year. Tetanus, diphtheria, and acellular pertussis (Tdap, Td) vaccine. You may need a Td booster every 10 years. Zoster vaccine. You may need this after age 26. Pneumococcal 13-valent conjugate (PCV13) vaccine. One  dose is recommended after age 35. Pneumococcal polysaccharide (PPSV23) vaccine. One dose is recommended after age 4. Talk to your health care provider about which screenings and vaccines you need and how often you need them. This information is not intended to replace advice given to you by your health care provider. Make sure you discuss any questions you have with your health care provider. Document Released: 02/17/2015 Document Revised: 10/11/2015 Document Reviewed: 11/22/2014 Elsevier Interactive Patient Education  2017 [place] Prevention in the Home Falls can cause injuries. They can happen to people of all ages. There are many things you can do to make your home safe and to help prevent falls. What can I do on the outside of my home? Regularly fix the edges of walkways and driveways and fix any cracks. Remove anything that might make you trip as you walk through a door, such as a raised step or threshold. Trim any bushes or trees on the path to your home. Use bright outdoor lighting. Clear any walking paths of anything that might make someone trip, such as rocks or tools. Regularly check to see if handrails are loose or broken. Make sure that both sides of any steps have handrails. Any raised decks and porches should have guardrails on the edges. Have any leaves, snow, or ice cleared regularly. Use sand or salt on walking paths during winter. Clean up any spills in your garage right away. This includes oil or grease spills. What can I do in the bathroom? Use night lights. Install grab bars by the toilet and in the tub and shower. Do not use towel bars as grab bars. Use non-skid mats or decals in the tub or shower. If you need to sit down in the shower, use a plastic, non-slip stool. Keep the floor dry. Clean up any water that spills on the floor as soon as it happens. Remove soap buildup in the tub or shower regularly. Attach bath mats securely with double-sided  non-slip rug tape. Do not have throw rugs and other things on the floor that can make you trip. What can I do in the bedroom? Use night lights. Make sure that you have a light by your bed that is easy to reach. Do not use any sheets or blankets that are too big for your bed. They should not hang down onto the floor. Have a firm chair that has side arms. You can use this for support while you get dressed. Do not have throw rugs and other things on the floor that can make you trip. What can I do in the kitchen? Clean up any spills right away. Avoid walking on wet floors. Keep items that you use a lot in easy-to-reach places. If you need to reach something above you, use a strong step stool that has a grab bar. Keep electrical cords out of the way. Do not use floor polish or wax that makes floors slippery. If you must use wax, use non-skid floor wax. Do not have throw rugs and other things on the floor that can make you trip. What can I do with my stairs? Do not leave any items on the stairs. Make sure that there are handrails on both sides of the stairs and use them. Fix handrails that are broken or loose. Make sure that handrails are as long as the stairways. Check any carpeting to make sure that it is firmly attached to the stairs. Fix any carpet that is loose or worn. Avoid  having throw rugs at the top or bottom of the stairs. If you do have throw rugs, attach them to the floor with carpet tape. Make sure that you have a light switch at the top of the stairs and the bottom of the stairs. If you do not have them, ask someone to add them for you. What else can I do to help prevent falls? Wear shoes that: Do not have high heels. Have rubber bottoms. Are comfortable and fit you well. Are closed at the toe. Do not wear sandals. If you use a stepladder: Make sure that it is fully opened. Do not climb a closed stepladder. Make sure that both sides of the stepladder are locked into place. Ask  someone to hold it for you, if possible. Clearly mark and make sure that you can see: Any grab bars or handrails. First and last steps. Where the edge of each step is. Use tools that help you move around (mobility aids) if they are needed. These include: Canes. Walkers. Scooters. Crutches. Turn on the lights when you go into a dark area. Replace any light bulbs as soon as they burn out. Set up your furniture so you have a clear path. Avoid moving your furniture around. If any of your floors are uneven, fix them. If there are any pets around you, be aware of where they are. Review your medicines with your doctor. Some medicines can make you feel dizzy. This can increase your chance of falling. Ask your doctor what other things that you can do to help prevent falls. This information is not intended to replace advice given to you by your health care provider. Make sure you discuss any questions you have with your health care provider. Document Released: 11/17/2008 Document Revised: 06/29/2015 Document Reviewed: 02/25/2014 Elsevier Interactive Patient Education  2017 [employer].

## 2021-11-27 NOTE — Progress Notes (Signed)
I connected with [name] today by telephone and verified that I am speaking with the correct person using two identifiers. Location patient: home Location provider: work Persons participating in the virtual visit: Keely, Drennan LPN.   I discussed the limitations, risks, security and privacy concerns of performing an evaluation and management service by telephone and the availability of in person appointments. I also discussed with the patient that there may be a patient responsible charge related to this service. The patient expressed understanding and verbally consented to this telephonic visit.    Interactive audio and video telecommunications were attempted between this provider and patient, however failed, due to patient having technical difficulties OR patient did not have access to video capability.  We continued and completed visit with audio only.     Vital signs may be patient reported or missing.  Subjective:   Nancy Herman is a 76 y.o. female who presents for Medicare Annual (Subsequent) preventive examination.  Review of Systems     Cardiac Risk Factors include: advanced age (>48mn, >>32women);dyslipidemia     Objective:    Today's Vitals   11/27/21 1011  Weight: 155 lb (70.3 kg)  Height: '5\' 3"'$  (1.6 m)   Body mass index is 27.46 kg/m.     11/27/2021   10:16 AM 11/14/2020   10:27 AM 11/10/2019   11:18 AM 09/29/2018   12:28 PM 03/17/2018    8:15 AM 03/17/2018   12:42 AM 03/16/2018    6:55 PM  Advanced Directives  Does Patient Have a Medical Advance Directive? Yes Yes Yes Yes No No   Type of Advance Directive Healthcare Power of Attorney Living will [place]     Does patient want to make changes to medical advance directive?    No - Patient declined     Copy of [place] Chart? No - copy requested  No - copy requested      Would patient like information on creating a medical  advance directive?     No - Patient declined  No - Patient declined    Current Medications (verified) Outpatient Encounter Medications as of 11/27/2021  Medication Sig   carbamide peroxide (DEBROX) 6.5 % OTIC solution Place 5 drops into the left ear 2 (two) times daily.   cetirizine (ZYRTEC) 10 MG tablet Take 10 mg by mouth daily.   esomeprazole (NEXIUM) 40 MG capsule Take 1 capsule by mouth once daily   FLUoxetine (PROZAC) 20 MG capsule Take 1 capsule (20 mg total) by mouth daily.   montelukast (SINGULAIR) 10 MG tablet TAKE 1 TABLET BY MOUTH AT BEDTIME   Plecanatide (TRULANCE) 3 MG TABS Take 1 tablet by mouth daily.   polyethylene glycol (MIRALAX) packet Take 17 g by mouth daily.   risperiDONE (RISPERDAL) 0.5 MG tablet 1  Bid   rosuvastatin (CRESTOR) 20 MG tablet Take 1 tablet (20 mg total) by mouth daily.   TURMERIC PO Take by mouth.   No facility-administered encounter medications on file as of 11/27/2021.    Allergies (verified) Other   History: Past Medical History:  Diagnosis Date   Anxiety    Frequent headaches    GERD (gastroesophageal reflux disease)    Hyperlipidemia    IBS (irritable bowel syndrome)    Pituitary insufficiency (HCC)    Thyroid disease    Past Surgical History:  Procedure Laterality Date   ABDOMINAL HYSTERECTOMY  1990   ANAL RECTAL MANOMETRY N/A  08/14/2018   Procedure: ANO RECTAL MANOMETRY;  Surgeon: Mauri Pole, MD;  Location: [facility];  Service: Endoscopy;  Laterality: N/A;   BRAIN SURGERY     BREAST EXCISIONAL BIOPSY Right unsure   BREAST EXCISIONAL BIOPSY Left unsure   eyelid surgery Bilateral 08/27/2021   OVARIAN CYST SURGERY  1994   REPAIR RECTOCELE  2016   and prolapsed uterus.   TONSILLECTOMY  1973   Family History  Problem Relation Age of Onset   Heart disease Mother        No details    Alcohol abuse Mother    Drug abuse Mother    Sickle cell anemia Mother    Heart disease Father        No details . Questionable  bypass or stent   Drug abuse Father    Alcohol abuse Father    Colon cancer Maternal Aunt    Esophageal cancer Neg Hx    Pancreatic cancer Neg Hx    Stomach cancer Neg Hx    Social History   Socioeconomic History   Marital status: Widowed    Spouse name: Not on file   Number of children: 2   Years of education: Not on file   Highest education level: Not on file  Occupational History   Occupation: retired  Tobacco Use   Smoking status: Never   Smokeless tobacco: Never  Vaping Use   Vaping Use: Never used  Substance and Sexual Activity   Alcohol use: No   Drug use: No   Sexual activity: Never  Other Topics Concern   Not on file  Social History Narrative   Lives alone.  Lives near son.  Was in [place].   Social Determinants of Health   Financial Resource Strain: Low Risk  (11/27/2021)   Overall Financial Resource Strain (CARDIA)    Difficulty of Paying Living Expenses: Not hard at all  Food Insecurity: No Food Insecurity (11/27/2021)   Hunger Vital Sign    Worried About Running Out of Food in the Last Year: Never true    Ran Out of Food in the Last Year: Never true  Transportation Needs: No Transportation Needs (11/27/2021)   PRAPARE - Hydrologist (Medical): No    Lack of Transportation (Non-Medical): No  Physical Activity: Inactive (11/27/2021)   Exercise Vital Sign    Days of Exercise per Week: 0 days    Minutes of Exercise per Session: 0 min  Stress: No Stress Concern Present (11/27/2021)   [place]    Feeling of Stress : Not at all  Social Connections: Moderately Isolated (11/14/2020)   Social Connection and Isolation Panel [NHANES]    Frequency of Communication with Friends and Family: More than three times a week    Frequency of Social Gatherings with Friends and Family: Once a week    Attends Religious Services: 1 to 4 times per year    Active Member of [employer] or  Organizations: No    Attends Archivist Meetings: Never    Marital Status: Widowed    Tobacco Counseling Counseling given: Not Answered   Clinical Intake:  Pre-visit preparation completed: Yes  Pain : No/denies pain     Nutritional Status: BMI 25 -29 Overweight Nutritional Risks: None Diabetes: No  How often do you need to have someone help you when you read instructions, pamphlets, or other written materials from your doctor or pharmacy?: 1 -  Never What is the last grade level you completed in school?: 12th grade  Diabetic? no  Interpreter Needed?: No  Information entered by :: NAllen LPN   Activities of Daily Living    11/27/2021   10:17 AM  In your present state of health, do you have any difficulty performing the following activities:  Hearing? 1  Comment thinks has wax build up  Vision? 0  Difficulty concentrating or making decisions? 0  Walking or climbing stairs? 0  Dressing or bathing? 0  Doing errands, shopping? 0  Preparing Food and eating ? N  Using the Toilet? N  In the past six months, have you accidently leaked urine? N  Do you have problems with loss of bowel control? N  Managing your Medications? N  Managing your Finances? N  Housekeeping or managing your Housekeeping? N    Patient Care Team: Martinique, Betty G, MD as PCP - General (Family Medicine) Minus Breeding, MD as PCP - Cardiology (Cardiology)  Indicate any recent Medical Services you may have received from other than [facility] providers in the past year (date may be approximate).     Assessment:   This is a routine wellness examination for [place].  Hearing/Vision screen Vision Screening - Comments:: Regular eye exams, Dr. Gershon Crane  Dietary issues and exercise activities discussed: Current Exercise Habits: The patient does not participate in regular exercise at present   Goals Addressed             This Visit's Progress    Patient Stated       11/27/2021, wants  to do more exercise       Depression Screen    11/27/2021   10:17 AM 11/09/2021    9:35 AM 08/28/2021    3:29 PM 11/14/2020   10:26 AM 04/11/2020    1:27 PM 11/10/2019   11:17 AM 11/10/2018    9:06 PM  PHQ 2/9 Scores  PHQ - 2 Score 0 0 2 0  1 0  PHQ- 9 Score  1 10         Information is confidential and restricted. Go to Review Flowsheets to unlock data.    Fall Risk    11/27/2021   10:17 AM 11/09/2021    9:35 AM 11/14/2020   10:28 AM 11/08/2020    7:39 PM 11/10/2019   11:21 AM  Fall Risk   Falls in the past year? 0 0 0 0 0  Number falls in past yr: 0 0 0 0 0  Injury with Fall? 0 0 0 0 0  Risk for fall due to : Medication side effect  Impaired vision;Impaired balance/gait  Impaired vision  Follow up Falls prevention discussed;Education provided;Falls evaluation completed  Falls prevention discussed Education provided Falls prevention discussed    FALL RISK PREVENTION PERTAINING TO THE HOME:  Any stairs in or around the home? Yes  If so, are there any without handrails? No  Home free of loose throw rugs in walkways, pet beds, electrical cords, etc? Yes  Adequate lighting in your home to reduce risk of falls? Yes   ASSISTIVE DEVICES UTILIZED TO PREVENT FALLS:  Life alert? No  Use of a cane, walker or w/c? No  Grab bars in the bathroom? Yes  Shower chair or bench in shower? Yes  Elevated toilet seat or a handicapped toilet? Yes   TIMED UP AND GO:  Was the test performed? No .      Cognitive Function:  11/27/2021   10:19 AM 11/14/2020   10:30 AM 11/10/2019   11:26 AM  6CIT Screen  What Year? 0 points 0 points 0 points  What month? 0 points 3 points 0 points  What time? 0 points 0 points   Count back from 20 2 points 0 points 0 points  Months in reverse 0 points 4 points 0 points  Repeat phrase 0 points 2 points 0 points  Total Score 2 points 9 points     Immunizations Immunization History  Administered Date(s) Administered   Fluad Quad(high Dose  65+) 11/09/2018, 11/09/2019, 11/08/2020, 11/09/2021   Influenza, High Dose Seasonal PF 11/06/2015, 10/24/2016, 12/03/2017   PFIZER(Purple Top)SARS-COV-2 Vaccination 02/26/2019, 03/19/2019, 12/04/2019   Pneumococcal Conjugate-13 09/04/2013   Pneumococcal Polysaccharide-23 08/04/2012, 05/10/2019   Zoster Recombinat (Shingrix) 04/04/2017, 06/06/2017, 07/05/2017, 08/13/2017    TDAP status: Up to date  Flu Vaccine status: Up to date  Pneumococcal vaccine status: Up to date  Covid-19 vaccine status: Completed vaccines  Qualifies for Shingles Vaccine? Yes   Zostavax completed Yes   Shingrix Completed?: Yes  Screening Tests Health Maintenance  Topic Date Due   Medicare Annual Wellness (AWV)  Never done   COVID-19 Vaccine (4 - Pfizer series) 01/29/2020   COLONOSCOPY (Pts 45-64yr Insurance coverage will need to be confirmed)  07/28/2022   MAMMOGRAM  10/10/2023   Pneumonia Vaccine 76 Years old  Completed   INFLUENZA VACCINE  Completed   DEXA SCAN  Completed   Hepatitis C Screening  Completed   Zoster Vaccines- Shingrix  Completed   HPV VACCINES  Aged Out   TETANUS/TDAP  Discontinued    Health Maintenance  Health Maintenance Due  Topic Date Due   Medicare Annual Wellness (AWV)  Never done   COVID-19 Vaccine (4 - Pfizer series) 01/29/2020    Colorectal cancer screening: Type of screening: Colonoscopy. Completed 07/28/2019. Repeat every 3 years  Mammogram status: Completed 10/09/2021. Repeat every year  Bone Density status: Completed 11/15/2019.   Lung Cancer Screening: (Low Dose CT Chest recommended if Age 472-80years, 30 pack-year currently smoking OR have quit w/in 15years.) does not qualify.   Lung Cancer Screening Referral: no  Additional Screening:  Hepatitis C Screening: does qualify; Completed 12/08/2017  Vision Screening: Recommended annual ophthalmology exams for early detection of glaucoma and other disorders of the eye. Is the patient up to date with their  annual eye exam?  Yes  Who is the provider or what is the name of the office in which the patient attends annual eye exams? Dr. SGershon CraneIf pt is not established with a provider, would they like to be referred to a provider to establish care? No .   Dental Screening: Recommended annual dental exams for proper oral hygiene  Community Resource Referral / Chronic Care Management: CRR required this visit?  No   CCM required this visit?  No      Plan:     I have personally reviewed and noted the following in the patient's chart:   Medical and social history Use of alcohol, tobacco or illicit drugs  Current medications and supplements including opioid prescriptions. Patient is not currently taking opioid prescriptions. Functional ability and status Nutritional status Physical activity Advanced directives List of other physicians Hospitalizations, surgeries, and ER visits in previous 12 months Vitals Screenings to include cognitive, depression, and falls Referrals and appointments  In addition, I have reviewed and discussed with patient certain preventive protocols, quality metrics, and best practice recommendations. A written personalized  care plan for preventive services as well as general preventive health recommendations were provided to patient.     Kellie Simmering, LPN   [id number]   Nurse Notes: none  Due to this being a virtual visit, the after visit summary with patients personalized plan was offered to patient via mail or my-chart.  Patient would like to access on my-chart

## 2021-12-04 ENCOUNTER — Ambulatory Visit (HOSPITAL_BASED_OUTPATIENT_CLINIC_OR_DEPARTMENT_OTHER): Payer: Medicare HMO | Admitting: Psychiatry

## 2021-12-04 DIAGNOSIS — F32A Depression, unspecified: Secondary | ICD-10-CM | POA: Diagnosis not present

## 2021-12-04 DIAGNOSIS — F411 Generalized anxiety disorder: Secondary | ICD-10-CM

## 2021-12-04 DIAGNOSIS — F323 Major depressive disorder, single episode, severe with psychotic features: Secondary | ICD-10-CM

## 2021-12-04 MED ORDER — FLUOXETINE HCL 20 MG PO CAPS: 20.0000 mg | ORAL_CAPSULE | Freq: Every day | ORAL | 5 refills | Status: DC

## 2021-12-04 MED ORDER — RISPERIDONE 0.5 MG PO TABS: ORAL_TABLET | ORAL | 5 refills | Status: DC

## 2021-12-04 NOTE — Progress Notes (Signed)
Psychiatric Initial Adult Assessment   Patient Identification: Nancy Herman MRN:  [id number] Date of Evaluation:  12/04/2021 Referral Source: Dr. Betty Martinique Chief Complaint:     Visit Diagnosis:    ICD-10-CM   1. Depressive disorder  F32.A FLUoxetine (PROZAC) 20 MG capsule    2. Generalized anxiety disorder  F41.1 FLUoxetine (PROZAC) 20 MG capsule       History of Present Illness:         Today the patient is doing well.  She drove her on her own.  She is having no problems driving.  She does not at night.  The patient lives alone.  She does all her medications and pays her own bills.  She does her own cooking.  She has a 90-year-old puppy.  Financially she is doing well.  She has 2 good friends and she has her son and her daughter who she leans on.  Patient has no evidence of psychosis.  She denies hearing voices or seeing visions and she is not paranoid.  She is doing well on Risperdal 0.5 mg twice daily which seems to be the lowest effective dose.  She continues taking Prozac for depression.  She is sleeping and eating well.  She had a reasonably good energy.  She likes to read a little bit.  She watches TV some.  She has an iPad.  The patient's cholesterol is moderately elevated and recently she has been put on a new medication for her cholesterol.  He does not have diabetes she does not smoke.  Overall she is medically very stable.  She sees Dr. Betty Martinique.  Today we reviewed her cholesterol's.  She shows no evidence of tardive dyskinesia. Depression Symptoms:  difficulty concentrating, (Hypo) Manic Symptoms:   Anxiety Symptoms:   Psychotic Symptoms:   PTSD Symptoms:   Past Psychiatric History: Prozac for many years  Previous Psychotropic Medications:   Substance Abuse History in the last 12 months:  No.  Consequences of Substance Abuse:   Past Medical History:  Past Medical History:  Diagnosis Date   Anxiety    Frequent headaches    GERD  (gastroesophageal reflux disease)    Hyperlipidemia    IBS (irritable bowel syndrome)    Pituitary insufficiency (HCC)    Thyroid disease     Past Surgical History:  Procedure Laterality Date   ABDOMINAL HYSTERECTOMY  1990   ANAL RECTAL MANOMETRY N/A 08/14/2018   Procedure: ANO RECTAL MANOMETRY;  Surgeon: Mauri Pole, MD;  Location: [facility];  Service: Endoscopy;  Laterality: N/A;   BRAIN SURGERY     BREAST EXCISIONAL BIOPSY Right unsure   BREAST EXCISIONAL BIOPSY Left unsure   eyelid surgery Bilateral 08/27/2021   OVARIAN CYST SURGERY  1994   REPAIR RECTOCELE  2016   and prolapsed uterus.   TONSILLECTOMY  1973    Family Psychiatric History:   Family History:  Family History  Problem Relation Age of Onset   Heart disease Mother        No details    Alcohol abuse Mother    Drug abuse Mother    Sickle cell anemia Mother    Heart disease Father        No details . Questionable bypass or stent   Drug abuse Father    Alcohol abuse Father    Colon cancer Maternal Aunt    Esophageal cancer Neg Hx    Pancreatic cancer Neg Hx    Stomach cancer Neg Hx  Social History:   Social History   Socioeconomic History   Marital status: Widowed    Spouse name: Not on file   Number of children: 2   Years of education: Not on file   Highest education level: Not on file  Occupational History   Occupation: retired  Tobacco Use   Smoking status: Never   Smokeless tobacco: Never  Vaping Use   Vaping Use: Never used  Substance and Sexual Activity   Alcohol use: No   Drug use: No   Sexual activity: Never  Other Topics Concern   Not on file  Social History Narrative   Lives alone.  Lives near son.  Was in [place].   Social Determinants of Health   Financial Resource Strain: Low Risk  (11/27/2021)   Overall Financial Resource Strain (CARDIA)    Difficulty of Paying Living Expenses: Not hard at all  Food Insecurity: No Food Insecurity (11/27/2021)   Hunger Vital Sign     Worried About Running Out of Food in the Last Year: Never true    Ran Out of Food in the Last Year: Never true  Transportation Needs: No Transportation Needs (11/27/2021)   PRAPARE - Hydrologist (Medical): No    Lack of Transportation (Non-Medical): No  Physical Activity: Inactive (11/27/2021)   Exercise Vital Sign    Days of Exercise per Week: 0 days    Minutes of Exercise per Session: 0 min  Stress: No Stress Concern Present (11/27/2021)   [place]    Feeling of Stress : Not at all  Social Connections: Moderately Isolated (11/14/2020)   Social Connection and Isolation Panel [NHANES]    Frequency of Communication with Friends and Family: More than three times a week    Frequency of Social Gatherings with Friends and Family: Once a week    Attends Religious Services: 1 to 4 times per year    Active Member of [employer] or Organizations: No    Attends Archivist Meetings: Never    Marital Status: Widowed    Additional Social History:   Allergies:   Allergies  Allergen Reactions   Other Other (See Comments)    Seasonal allergies    Metabolic Disorder Labs: Lab Results  Component Value Date   HGBA1C 6.1 11/09/2021   Lab Results  Component Value Date   PROLACTIN 21.4 02/28/2020   PROLACTIN 14.6 03/05/2019   Lab Results  Component Value Date   CHOL 239 (H) 11/09/2021   TRIG 50.0 11/09/2021   HDL 88.90 11/09/2021   CHOLHDL 3 11/09/2021   VLDL 10.0 11/09/2021   LDLCALC 140 (H) 11/09/2021   LDLCALC 111 (H) 11/08/2020   Lab Results  Component Value Date   TSH 3.10 03/05/2021    Therapeutic Level Labs: No results found for: "LITHIUM" No results found for: "CBMZ" No results found for: "VALPROATE"  Current Medications: Current Outpatient Medications  Medication Sig Dispense Refill   carbamide peroxide (DEBROX) 6.5 % OTIC solution Place 5 drops into the left  ear 2 (two) times daily. 15 mL 0   cetirizine (ZYRTEC) 10 MG tablet Take 10 mg by mouth daily.     esomeprazole (NEXIUM) 40 MG capsule Take 1 capsule by mouth once daily 90 capsule 0   FLUoxetine (PROZAC) 20 MG capsule Take 1 capsule (20 mg total) by mouth daily. 30 capsule 5   montelukast (SINGULAIR) 10 MG tablet TAKE 1 TABLET BY MOUTH  AT BEDTIME 90 tablet 3   Plecanatide (TRULANCE) 3 MG TABS Take 1 tablet by mouth daily. 30 tablet 6   polyethylene glycol (MIRALAX) packet Take 17 g by mouth daily. 14 each 0   risperiDONE (RISPERDAL) 0.5 MG tablet 1  Bid 60 tablet 5   rosuvastatin (CRESTOR) 20 MG tablet Take 1 tablet (20 mg total) by mouth daily. 30 tablet 3   TURMERIC PO Take by mouth.     No current facility-administered medications for this visit.    Musculoskeletal: Strength & Muscle Tone: within normal limits Gait & Station: normal Patient leans:   Psychiatric Specialty Exam: ROS  There were no vitals taken for this visit.There is no height or weight on file to calculate BMI.  General Appearance: Casual  Eye Contact:  Good  Speech: Good  Volume:  Normal  Mood:  NA  Affect:  NA and Appropriate  Thought Process:  Coherent  Orientation:  Full (Time, Place, and Person)  Thought Content:  Logical  Suicidal Thoughts:  No  Homicidal Thoughts:  No  Memory:  Negative  Judgement:  Good  Insight:  Fair  Psychomotor Activity:  Normal  Concentration:    Recall:  Good  Fund of Knowledge:Good  Language: Good  Akathisia:  No  Handed:    AIMS (if indicated):  not done  Assets:  Desire for Improvement  ADL's:  Intact  Cognition: WNL  Sleep:  Good   Screenings: PHQ2-9    Flowsheet Row Clinical Support from 11/27/2021 in [facility] at [facility] from 11/09/2021 in [facility] at [facility] from 08/28/2021 in [facility] from 11/14/2020 in [facility] at [facility] from 04/11/2020 in  [facility]  PHQ-2 Total Score 0 0 2 0 0  PHQ-9 Total Score -- 1 10 -- --      Flowsheet Row ED from 03/16/2018 in [facility] from 02/22/2018 in [facility]  C-SSRS RISK CATEGORY No Risk No Risk       Assessment and Plan:  10/31/20231:35 PM    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  In an effort to reduce her Risperdal the patient became psychotic again.  He is now back on Risperdal and is doing very well.  She is having no consequences from the Risperdal.  The patient and her family want her to stay on Risperdal for the time being.  I think that is reasonable.  We will attempt to reduce it again in a year or so.  The patient also takes Prozac.  The combination of Risperdal and Prozac serves the patient well.  She is functioning very well.  She will return to see me in 4 months.

## 2022-01-24 ENCOUNTER — Other Ambulatory Visit: Payer: Self-pay | Admitting: Family Medicine

## 2022-01-29 ENCOUNTER — Telehealth: Payer: Self-pay | Admitting: Family Medicine

## 2022-01-29 DIAGNOSIS — R7303 Prediabetes: Secondary | ICD-10-CM

## 2022-01-29 DIAGNOSIS — D352 Benign neoplasm of pituitary gland: Secondary | ICD-10-CM

## 2022-01-29 NOTE — Telephone Encounter (Signed)
Referrals placed 

## 2022-01-29 NOTE — Telephone Encounter (Signed)
Requesting referral to podiatrist. Endocrinologist is leaving requesting recommendation of a new one

## 2022-02-11 ENCOUNTER — Telehealth: Payer: Self-pay

## 2022-02-11 DIAGNOSIS — E785 Hyperlipidemia, unspecified: Secondary | ICD-10-CM

## 2022-02-11 NOTE — Telephone Encounter (Signed)
-----   Message from Rodrigo Ran, [place] sent at 11/13/2021 12:44 PM EDT ----- Lipid & ALT in 3 months.

## 2022-02-11 NOTE — Telephone Encounter (Signed)
She just needs a fasting lab appointment, thank you!

## 2022-02-12 ENCOUNTER — Encounter: Payer: Self-pay | Admitting: Podiatry

## 2022-02-12 ENCOUNTER — Ambulatory Visit: Payer: Medicare HMO | Admitting: Podiatry

## 2022-02-12 VITALS — BP 120/65 | HR 72

## 2022-02-12 DIAGNOSIS — M79674 Pain in right toe(s): Secondary | ICD-10-CM | POA: Diagnosis not present

## 2022-02-12 DIAGNOSIS — L84 Corns and callosities: Secondary | ICD-10-CM | POA: Diagnosis not present

## 2022-02-12 DIAGNOSIS — R7303 Prediabetes: Secondary | ICD-10-CM | POA: Diagnosis not present

## 2022-02-12 DIAGNOSIS — B351 Tinea unguium: Secondary | ICD-10-CM | POA: Diagnosis not present

## 2022-02-12 DIAGNOSIS — M79675 Pain in left toe(s): Secondary | ICD-10-CM

## 2022-02-12 NOTE — Progress Notes (Signed)
  Subjective:  Patient ID: Nancy Herman, female    DOB: 27-May-1945,   MRN: [id number]  Chief Complaint  Patient presents with   Callouses   Nail Problem    Patient is here for bilateral great toe nail fungus and left foot callous right bottom of foot.patient is prediabetic.    77 y.o. female presents for concern of thickened elongated and painful nails that are difficult to trim. Requesting to have them trimmed today. Also concern for left foot callus. Denies  burning and tingling in their feet. Patient is pre-diabetic.   PCP:  Martinique, Betty G, MD    . Denies any other pedal complaints. Denies n/v/f/c.   Past Medical History:  Diagnosis Date   Anxiety    Frequent headaches    GERD (gastroesophageal reflux disease)    Hyperlipidemia    IBS (irritable bowel syndrome)    Pituitary insufficiency (HCC)    Thyroid disease     Objective:  Physical Exam: Vascular: DP/PT pulses 2/4 bilateral. CFT <3 seconds. Absent hair growth on digits. Edema noted to bilateral lower extremities. Xerosis noted bilaterally.  Skin. No lacerations or abrasions bilateral feet. Nails 1-5 bilateral  are thickened discolored and elongated with subungual debris. Hyperkeratotic tissue noted to plantar third metatarsal right foot.  Musculoskeletal: MMT 5/5 bilateral lower extremities in DF, PF, Inversion and Eversion. Deceased ROM in DF of ankle joint.  Neurological: Sensation intact to light touch. Protective sensation intact bilateral.    Assessment:   1. Pain due to onychomycosis of toenails of both feet   2. Callus of foot   3. Prediabetes      Plan:  Patient was evaluated and treated and all questions answered. -Discussed and educated patient on diabetic foot care, especially with  regards to the vascular, neurological and musculoskeletal systems.  -Stressed the importance of good glycemic control and the detriment of not  controlling glucose levels in relation to the foot. -Discussed  supportive shoes at all times and checking feet regularly.  -Mechanically debrided all nails 1-5 bilateral using sterile nail nipper and filed with dremel without incident as courtesy.  -Hyperkeratotic tissue debrided with chisel without incident as courtesy.  -Answered all patient questions -Patient to return as needed -Patient advised to call the office if any problems or questions arise in the meantime.   Lorenda Peck, DPM

## 2022-02-14 ENCOUNTER — Telehealth: Payer: Self-pay | Admitting: Family Medicine

## 2022-02-14 ENCOUNTER — Other Ambulatory Visit (INDEPENDENT_AMBULATORY_CARE_PROVIDER_SITE_OTHER): Payer: Medicare HMO

## 2022-02-14 DIAGNOSIS — E785 Hyperlipidemia, unspecified: Secondary | ICD-10-CM | POA: Diagnosis not present

## 2022-02-14 LAB — LIPID PANEL
Cholesterol: 190 mg/dL (ref 0–200)
HDL: 77.8 mg/dL (ref >39.00)
LDL Cholesterol: 97 mg/dL (ref 0–99)
NonHDL: 112.12
Total CHOL/HDL Ratio: 2
Triglycerides: 76 mg/dL (ref 0.0–149.0)
VLDL: 15.2 mg/dL (ref 0.0–40.0)

## 2022-02-14 LAB — HEPATIC FUNCTION PANEL
ALT: 18 U/L (ref 0–35)
AST: 20 U/L (ref 0–37)
Albumin: 4 g/dL (ref 3.5–5.2)
Alkaline Phosphatase: 59 U/L (ref 39–117)
Bilirubin, Direct: 0.1 mg/dL (ref 0.0–0.3)
Total Bilirubin: 0.5 mg/dL (ref 0.2–1.2)
Total Protein: 7.4 g/dL (ref 6.0–8.3)

## 2022-02-14 NOTE — Telephone Encounter (Addendum)
FYI Pt is calling to let md know dr Silverio Decamp has prescribed her with linzess 290 mg she get through patient assistant program. Pt has been on medication for a couple months

## 2022-02-15 ENCOUNTER — Telehealth: Payer: Self-pay | Admitting: *Deleted

## 2022-02-15 NOTE — Telephone Encounter (Signed)
Left message for patient to come in and fill out her part. Paperwork will be at front desk on 3rd floor.

## 2022-02-15 NOTE — Telephone Encounter (Signed)
Patient dropped off forms for PAP [employer] patient assistance program for Linzess 255mg , Signed by Dr NSilverio Decamptoday , I did not fax due to missing information from the patient on the forms I'v highlighted the areas she needs to respond to before this can be faxed.

## 2022-02-18 NOTE — Telephone Encounter (Signed)
[employer] PAP faxed today, will mail patient a copy of the application

## 2022-02-18 NOTE — Telephone Encounter (Signed)
Patient aware of paperwork being faxed today and copy mailed to the patient.

## 2022-02-18 NOTE — Telephone Encounter (Signed)
Patient called to see if she can have a copy of the paperwork that she filled out for the assistance program. Please advise.

## 2022-03-01 ENCOUNTER — Other Ambulatory Visit: Payer: Self-pay | Admitting: Family Medicine

## 2022-03-05 ENCOUNTER — Encounter: Payer: Self-pay | Admitting: Nurse Practitioner

## 2022-03-05 ENCOUNTER — Ambulatory Visit: Payer: Medicare HMO | Admitting: Nurse Practitioner

## 2022-03-05 VITALS — BP 96/60 | HR 85 | Ht 63.0 in | Wt 154.1 lb

## 2022-03-05 DIAGNOSIS — K581 Irritable bowel syndrome with constipation: Secondary | ICD-10-CM

## 2022-03-05 NOTE — Patient Instructions (Addendum)
If you are age 75 or older, your body mass index should be between 23-30. Your Body mass index is 27.3 kg/m. If this is out of the aforementioned range listed, please consider follow up with your Primary Care Provider. ________________________________________________________  The [facility] providers would like to encourage you to use [facility] to communicate with providers for non-urgent requests or questions.  Due to long hold times on the telephone, sending your provider a message by [facility] may be a faster and more efficient way to get a response.  Please allow 48 business hours for a response.  Please remember that this is for non-urgent requests.  _______________________________________________________  Sinclair GroomsRolan Lipa 265mg daily on an empty stomach.  Patient assistance work has been completed and [place]  You should be hearing from ATroysoon.  You are due for 3 year colonoscopy in June 2024 with Dr NSilverio Decamp  YDennis Bastshould receive a letter in the mail in May 2024 to call back to our office schedule the colonoscopy.  Thank you for entrusting me with your care and choosing [facility]  PTye Savoy NP

## 2022-03-05 NOTE — Progress Notes (Signed)
Assessment    Patient profile:  Nancy Herman is a 77 y.o. female known to Nancy Herman with a past medical history of colon polyps, IBS-C, rectocele repair in 2015.  See PMH /PSH for additional history.   # 77 yo female with chronic IBS-C. Doing well on Linzess 290 mcg daily.   # History of adenomatous / sessile serrated colon polyps ( June 2021). Three year surveillance colonoscopy due June 2024.   # Non-specific proctitis / colitis on colonoscopy in 2021. Findings may have been prep related.   Plan   Will refill Linzess 290 mcg daily on an empty stomach, #90 with 3 refills After patient left I realized that trulance is also on home med list. Confirmed with patient that she is not taking. Will removed from med list.  She will be notified sometime in May / June 2023 about scheduling surveillance colonoscopy    HPI    Chief complaint: medication refill.    Nancy Herman was last seen 12/05/20. She has chronic IBS-C and  dyssynergic defecation. She completed pelvic floor physical therapy with biofeedback with some improvement but not resolution of symptoms. She has been getting Linzess through the patient assistance program. She takes one a day on an empty stomach. She has a BM daily.    Previous GI Evaluation   Colonoscopy March 31, 2014 in [place] showed internal hemorrhoids, mild proctitis biopsies showed colonic mucosal hyperplasia and fibrosis suggestive of rectal prolapse, tubular adenoma removed from ascending colon.  Recommend recall colonoscopy in 5 years  EGD March 31, 2014 showed gastritis, duodenitis and small hiatal hernia  EGD August 12, 2016 by Dr. Collene Herman showed small hiatal hernia otherwise normal exam   Colonoscopy 07/28/19 - Two 1 to 2 mm polyps in the transverse colon and in the cecum, removed with a cold biopsy forceps. Resected and retrieved. - One 5 mm polyp in the sigmoid colon, removed with a cold snare. Resected and retrieved. - Erythematous mucosa  in the rectum. Biopsied. - Diverticulosis in the sigmoid colon. - Non-bleeding internal hemorrhoids.   Diagnosis 1. Surgical [P], colon, sigmoid, transverse and cecum, polyp (3) - TUBULAR ADENOMA WITHOUT HIGH GRADE DYSPLASIA (X1). - SESSILE SERRATED POLYP WITHOUT CYTOLOGIC DYSPLASIA (X2). 2. Surgical [P], colon, rectum - FOCAL ACTIVE COLITIS WITH HYALINIZATION OF LAMINA PROPRIA.  Labs:       Latest Ref Rng & Units 02/14/2022    8:32 AM 11/09/2021    9:58 AM 11/08/2020    9:59 AM  Hepatic Function  Total Protein 6.0 - 8.3 Herman/dL 7.4  7.5  7.2   Albumin 3.5 - 5.2 Herman/dL 4.0  4.2  4.0   AST 0 - 37 U/L '20  18  18   '$ ALT 0 - 35 U/L '18  13  11   '$ Alk Phosphatase 39 - 117 U/L 59  57  55   Total Bilirubin 0.2 - 1.2 mg/dL 0.5  0.3  0.4   Bilirubin, Direct 0.0 - 0.3 mg/dL 0.1      No recent imaging.    Past Medical History:  Diagnosis Date   Anxiety    Frequent headaches    GERD (gastroesophageal reflux disease)    Hyperlipidemia    IBS (irritable bowel syndrome)    Pituitary insufficiency (HCC)    Thyroid disease     Past Surgical History:  Procedure Laterality Date   ABDOMINAL HYSTERECTOMY  1990   ANAL RECTAL MANOMETRY N/A 08/14/2018   Procedure: ANO RECTAL MANOMETRY;  Surgeon: Nancy Pole, MD;  Location: [facility];  Service: Endoscopy;  Laterality: N/A;   BRAIN SURGERY     BREAST EXCISIONAL BIOPSY Right unsure   BREAST EXCISIONAL BIOPSY Left unsure   eyelid surgery Bilateral 08/27/2021   OVARIAN CYST SURGERY  1994   REPAIR RECTOCELE  2016   and prolapsed uterus.   TONSILLECTOMY  1973    Current Medications, Allergies, Family History and Social History were reviewed in [employer] record.     Current Outpatient Medications  Medication Sig Dispense Refill   carbamide peroxide (DEBROX) 6.5 % OTIC solution Place 5 drops into the left ear 2 (two) times daily. 15 mL 0   cetirizine (ZYRTEC) 10 MG tablet Take 10 mg by mouth daily.      esomeprazole (NEXIUM) 40 MG capsule Take 1 capsule by mouth once daily 90 capsule 3   FLUoxetine (PROZAC) 20 MG capsule Take 1 capsule (20 mg total) by mouth daily. 30 capsule 5   linaclotide (LINZESS) 290 MCG CAPS capsule Take 290 mcg by mouth daily before breakfast.     montelukast (SINGULAIR) 10 MG tablet TAKE 1 TABLET BY MOUTH AT BEDTIME 90 tablet 3   polyethylene glycol (MIRALAX) packet Take 17 Herman by mouth daily. 14 each 0   risperiDONE (RISPERDAL) 0.5 MG tablet 1  Bid 60 tablet 5   rosuvastatin (CRESTOR) 20 MG tablet Take 1 tablet by mouth once daily 90 tablet 2   TURMERIC PO Take by mouth.     No current facility-administered medications for this visit.    Review of Systems: No chest pain. No shortness of breath. No urinary complaints.   Physical Exam  Wt Readings from Last 3 Encounters:  11/27/21 155 lb (70.3 kg)  11/09/21 154 lb 8 oz (70.1 kg)  08/28/21 156 lb (70.8 kg)   BP 96/80, HR 85 LMP  (LMP Unknown)  Constitutional:  Generally well appearing female in no acute distress. Psychiatric: Pleasant. Normal mood and affect. Behavior is normal. EENT: Pupils normal.  Conjunctivae are normal. No scleral icterus. Neck supple.  Cardiovascular: Normal rate, regular rhythm.  Pulmonary/chest: Effort normal and breath sounds normal. No wheezing, rales or rhonchi. Abdominal: Soft, nondistended, nontender. Bowel sounds active throughout. There are no masses palpable. No hepatomegaly. Neurological: Alert and oriented to person place and time. Musculoskeletal:  Extremities: ** edema Skin: Skin is warm and dry. No rashes noted.  Tye Savoy, NP  03/05/2022, 8:31 AM  Cc:  Martinique, Betty G, MD

## 2022-03-15 ENCOUNTER — Other Ambulatory Visit: Payer: Self-pay | Admitting: Family Medicine

## 2022-03-15 DIAGNOSIS — J309 Allergic rhinitis, unspecified: Secondary | ICD-10-CM

## 2022-03-25 ENCOUNTER — Telehealth: Payer: Self-pay | Admitting: Family Medicine

## 2022-03-25 NOTE — Telephone Encounter (Signed)
Pt called to ask MD if she needs to have the RSV shot?  Pt's states she can get it for free at [facility]

## 2022-03-29 NOTE — Telephone Encounter (Signed)
I spoke with patient, she is aware of message below & verbalized understanding.

## 2022-03-29 NOTE — Telephone Encounter (Signed)
Yes, she is in the population group for which RSV vaccine has been recommended. Thanks, BJ

## 2022-04-10 ENCOUNTER — Ambulatory Visit (HOSPITAL_BASED_OUTPATIENT_CLINIC_OR_DEPARTMENT_OTHER): Payer: Medicare HMO | Admitting: Psychiatry

## 2022-04-10 ENCOUNTER — Encounter (HOSPITAL_COMMUNITY): Payer: Self-pay | Admitting: Psychiatry

## 2022-04-10 VITALS — Ht 63.0 in | Wt 148.0 lb

## 2022-04-10 DIAGNOSIS — F32A Depression, unspecified: Secondary | ICD-10-CM

## 2022-04-10 DIAGNOSIS — F411 Generalized anxiety disorder: Secondary | ICD-10-CM | POA: Diagnosis not present

## 2022-04-10 DIAGNOSIS — F325 Major depressive disorder, single episode, in full remission: Secondary | ICD-10-CM

## 2022-04-10 MED ORDER — FLUOXETINE HCL 20 MG PO CAPS: 20.0000 mg | ORAL_CAPSULE | Freq: Every day | ORAL | 5 refills | Status: DC

## 2022-04-10 MED ORDER — RISPERIDONE 0.5 MG PO TABS: ORAL_TABLET | ORAL | 5 refills | Status: DC

## 2022-04-10 NOTE — Progress Notes (Signed)
Psychiatric Initial Adult Assessment   Patient Identification: Nancy Herman MRN:  [id number] Date of Evaluation:  04/10/2022 Referral Source: Dr. Betty Martinique Chief Complaint:     Visit Diagnosis:    ICD-10-CM   1. Generalized anxiety disorder  F41.1 FLUoxetine (PROZAC) 20 MG capsule    2. Depressive disorder  F32.A FLUoxetine (PROZAC) 20 MG capsule       History of Present Illness:    Today the patient is doing well.  She is at her baseline.  This patient drove here today.  She is very independent.  She does all her instrumental ADLs.  Her only complaint is that she feels overly sleepy during the day.  She gets a good night sleep at night but she finds herself taking naps and sleeping excessively during the day.  The patient is eating well.  She has got a reasonable amount of energy.  She still enjoys a lot of things.  He has a puppy that is 75 years old.  She enjoys game shows.  She goes to various meetings.  She goes to [place] study, church and [employer].  The patient has 2 adult children and 5 grandchildren.  One of her grandchild from is in college in [place].  She believes they are all doing well.  Financially she is stable.  The patient likes her home although some things need to be fixed in.  Overall she is getting along quite well.  She gets regular medical care.  She is a patient who was diagnosed with major depression with psychosis.  Over the last few years we have attempted to reduce risperidone at 1 point to be just about stopped.  Then she had a recurrence of psychosis.  Therefore we will returning back to 0.5 mg twice daily.  Today our plan is actually to change it to taking 0.5 mg 2 at night and none in the morning.  It is my hope that the dose she takes in the morning is sedating her.  Her mood is very good.    Depression Symptoms:  difficulty concentrating, (Hypo) Manic Symptoms:   Anxiety Symptoms:   Psychotic Symptoms:   PTSD Symptoms:   Past Psychiatric  History: Prozac for many years  Previous Psychotropic Medications:   Substance Abuse History in the last 12 months:  No.  Consequences of Substance Abuse:   Past Medical History:  Past Medical History:  Diagnosis Date   Anxiety    Frequent headaches    GERD (gastroesophageal reflux disease)    Hyperlipidemia    IBS (irritable bowel syndrome)    Pituitary insufficiency (HCC)    Thyroid disease     Past Surgical History:  Procedure Laterality Date   ABDOMINAL HYSTERECTOMY  1990   ANAL RECTAL MANOMETRY N/A 08/14/2018   Procedure: ANO RECTAL MANOMETRY;  Surgeon: Mauri Pole, MD;  Location: [facility];  Service: Endoscopy;  Laterality: N/A;   BRAIN SURGERY     BREAST EXCISIONAL BIOPSY Right unsure   BREAST EXCISIONAL BIOPSY Left unsure   eyelid surgery Bilateral 08/27/2021   OVARIAN CYST SURGERY  1994   REPAIR RECTOCELE  2016   and prolapsed uterus.   TONSILLECTOMY  1973    Family Psychiatric History:   Family History:  Family History  Problem Relation Age of Onset   Heart disease Mother        No details    Alcohol abuse Mother    Drug abuse Mother    Sickle cell anemia Mother  Heart disease Father        No details . Questionable bypass or stent   Drug abuse Father    Alcohol abuse Father    Colon cancer Maternal Aunt    Esophageal cancer Neg Hx    Pancreatic cancer Neg Hx    Stomach cancer Neg Hx     Social History:   Social History   Socioeconomic History   Marital status: Widowed    Spouse name: Not on file   Number of children: 2   Years of education: Not on file   Highest education level: Not on file  Occupational History   Occupation: retired  Tobacco Use   Smoking status: Never   Smokeless tobacco: Never  Vaping Use   Vaping Use: Never used  Substance and Sexual Activity   Alcohol use: No   Drug use: No   Sexual activity: Never  Other Topics Concern   Not on file  Social History Narrative   Lives alone.  Lives near son.   Was in [place].   Social Determinants of Health   Financial Resource Strain: Low Risk  (11/27/2021)   Overall Financial Resource Strain (CARDIA)    Difficulty of Paying Living Expenses: Not hard at all  Food Insecurity: No Food Insecurity (11/27/2021)   Hunger Vital Sign    Worried About Running Out of Food in the Last Year: Never true    Ran Out of Food in the Last Year: Never true  Transportation Needs: No Transportation Needs (11/27/2021)   PRAPARE - Hydrologist (Medical): No    Lack of Transportation (Non-Medical): No  Physical Activity: Inactive (11/27/2021)   Exercise Vital Sign    Days of Exercise per Week: 0 days    Minutes of Exercise per Session: 0 min  Stress: No Stress Concern Present (11/27/2021)   [place]    Feeling of Stress : Not at all  Social Connections: Moderately Isolated (11/14/2020)   Social Connection and Isolation Panel [NHANES]    Frequency of Communication with Friends and Family: More than three times a week    Frequency of Social Gatherings with Friends and Family: Once a week    Attends Religious Services: 1 to 4 times per year    Active Member of [employer] or Organizations: No    Attends Archivist Meetings: Never    Marital Status: Widowed    Additional Social History:   Allergies:   Allergies  Allergen Reactions   Other Other (See Comments)    Seasonal allergies    Metabolic Disorder Labs: Lab Results  Component Value Date   HGBA1C 6.1 11/09/2021   Lab Results  Component Value Date   PROLACTIN 21.4 02/28/2020   PROLACTIN 14.6 03/05/2019   Lab Results  Component Value Date   CHOL 190 02/14/2022   TRIG 76.0 02/14/2022   HDL 77.80 02/14/2022   CHOLHDL 2 02/14/2022   VLDL 15.2 02/14/2022   LDLCALC 97 02/14/2022   LDLCALC 140 (H) 11/09/2021   Lab Results  Component Value Date   TSH 3.10 03/05/2021    Therapeutic Level  Labs: No results found for: "LITHIUM" No results found for: "CBMZ" No results found for: "VALPROATE"  Current Medications: Current Outpatient Medications  Medication Sig Dispense Refill   carbamide peroxide (DEBROX) 6.5 % OTIC solution Place 5 drops into the left ear 2 (two) times daily. 15 mL 0   cetirizine (ZYRTEC) 10  MG tablet Take 10 mg by mouth daily.     esomeprazole (NEXIUM) 40 MG capsule Take 1 capsule by mouth once daily 90 capsule 3   linaclotide (LINZESS) 290 MCG CAPS capsule Take 290 mcg by mouth daily before breakfast.     montelukast (SINGULAIR) 10 MG tablet TAKE 1 TABLET BY MOUTH AT BEDTIME 90 tablet 1   polyethylene glycol (MIRALAX) packet Take 17 g by mouth daily. 14 each 0   rosuvastatin (CRESTOR) 20 MG tablet Take 1 tablet by mouth once daily 90 tablet 2   TURMERIC PO Take by mouth.     FLUoxetine (PROZAC) 20 MG capsule Take 1 capsule (20 mg total) by mouth daily. 30 capsule 5   risperiDONE (RISPERDAL) 0.5 MG tablet 2 qhs 60 tablet 5   No current facility-administered medications for this visit.    Musculoskeletal: Strength & Muscle Tone: within normal limits Gait & Station: normal Patient leans:   Psychiatric Specialty Exam: ROS  Height '5\' 3"'$  (1.6 m), weight 148 lb (67.1 kg).Body mass index is 26.22 kg/m.  General Appearance: Casual  Eye Contact:  Good  Speech: Good  Volume:  Normal  Mood:  NA  Affect:  NA and Appropriate  Thought Process:  Coherent  Orientation:  Full (Time, Place, and Person)  Thought Content:  Logical  Suicidal Thoughts:  No  Homicidal Thoughts:  No  Memory:  Negative  Judgement:  Good  Insight:  Fair  Psychomotor Activity:  Normal  Concentration:    Recall:  Good  Fund of Knowledge:Good  Language: Good  Akathisia:  No  Handed:    AIMS (if indicated):  not done  Assets:  Desire for Improvement  ADL's:  Intact  Cognition: WNL  Sleep:  Good   Screenings: PHQ2-9    Flowsheet Row Clinical Support from 11/27/2021 in [facility] [facility] [place] from 11/09/2021 in [facility] from 08/28/2021 in [facility] [facility] [place] from 11/14/2020 in [place] [facility] [place] from 04/11/2020 in [facility]  PHQ-2 Total Score 0 0 2 0 0  PHQ-9 Total Score -- 1 10 -- --      Flowsheet Row ED from 03/16/2018 in [facility] at [facility] from 02/22/2018 in [facility] Emergency Department at [place] No Risk No Risk       Assessment and Plan:  3/6/20241:46 PM    Today the patient is doing well.  Her diagnosis is major depression with psychosis in remission.  She will continue taking Prozac at the present dose.  Today we will adjust her Risperdal to taking it 0.5 mg 2 at night.  She will take none of it in the morning.  She will continue with these 2 agents and return to see me back in 5 months.  Patient is very stable.

## 2022-04-12 NOTE — Telephone Encounter (Signed)
Returned patients call and she said Nancy Herman was saying the DEA number was missing on her application which was faxed on 02/21/2022.   I refaxed with DEA number and faxed to [phone]. In the meantime I offered samples of Linzess 265mg to the patient. She will pick them up next week.

## 2022-04-12 NOTE — Telephone Encounter (Signed)
PT is calling back concerning the paperwork she sent in to get assistance for Linzess. She says it is missing some information and wants to discuss it. Please advise.

## 2022-05-02 DIAGNOSIS — H5213 Myopia, bilateral: Secondary | ICD-10-CM | POA: Diagnosis not present

## 2022-05-02 DIAGNOSIS — H524 Presbyopia: Secondary | ICD-10-CM | POA: Diagnosis not present

## 2022-05-02 DIAGNOSIS — H02834 Dermatochalasis of left upper eyelid: Secondary | ICD-10-CM | POA: Diagnosis not present

## 2022-05-02 DIAGNOSIS — H02831 Dermatochalasis of right upper eyelid: Secondary | ICD-10-CM | POA: Diagnosis not present

## 2022-05-02 DIAGNOSIS — H40003 Preglaucoma, unspecified, bilateral: Secondary | ICD-10-CM | POA: Diagnosis not present

## 2022-05-02 DIAGNOSIS — H25813 Combined forms of age-related cataract, bilateral: Secondary | ICD-10-CM | POA: Diagnosis not present

## 2022-05-06 ENCOUNTER — Ambulatory Visit: Payer: Medicare HMO | Admitting: Podiatry

## 2022-05-06 ENCOUNTER — Encounter: Payer: Self-pay | Admitting: Podiatry

## 2022-05-06 DIAGNOSIS — R7303 Prediabetes: Secondary | ICD-10-CM | POA: Diagnosis not present

## 2022-05-06 DIAGNOSIS — L84 Corns and callosities: Secondary | ICD-10-CM | POA: Diagnosis not present

## 2022-05-06 NOTE — Progress Notes (Signed)
  Subjective:  Patient ID: Nancy Herman, female    DOB: [birth year],   MRN: [id number]  Chief Complaint  Patient presents with   Callouses    Patient is here today for a callous on her bottom of right foot.    77 y.o. female presents for concern of callus on her right foot.  Requesting to have them trimmed today. Also concern for left foot callus. Denies  burning and tingling in their feet. Patient is pre-diabetic.   PCP:  Martinique, Betty G, MD    . Denies any other pedal complaints. Denies n/v/f/c.   Past Medical History:  Diagnosis Date   Anxiety    Frequent headaches    GERD (gastroesophageal reflux disease)    Hyperlipidemia    IBS (irritable bowel syndrome)    Pituitary insufficiency    Thyroid disease     Objective:  Physical Exam: Vascular: DP/PT pulses 2/4 bilateral. CFT <3 seconds. Absent hair growth on digits. Edema noted to bilateral lower extremities. Xerosis noted bilaterally.  Skin. No lacerations or abrasions bilateral feet. Nails 1-5 bilateral  are thickened discolored with subungual debris. Hyperkeratotic tissue noted to plantar third metatarsal right foot.  Musculoskeletal: MMT 5/5 bilateral lower extremities in DF, PF, Inversion and Eversion. Deceased ROM in DF of ankle joint.  Neurological: Sensation intact to light touch. Protective sensation intact bilateral.    Assessment:   1. Callus of foot   2. Prediabetes       Plan:  Patient was evaluated and treated and all questions answered. -Discussed and educated patient on diabetic foot care, especially with  regards to the vascular, neurological and musculoskeletal systems.  -Stressed the importance of good glycemic control and the detriment of not  controlling glucose levels in relation to the foot. -Discussed corns and calluses with patient and treatment options.  -Hyperkeratotic tissue was debrided with chisel without incident.  -Applied salycylic acid treatment to area with dressing. Advised  to remove bandaging tomorrow.  -Encouraged daily moisturizing -Discussed use of pumice stone -Advised good supportive shoes and inserts -Patient to return to office as needed or sooner if condition worsens.    Lorenda Peck, DPM

## 2022-06-20 ENCOUNTER — Encounter: Payer: Self-pay | Admitting: Gastroenterology

## 2022-07-22 DIAGNOSIS — L811 Chloasma: Secondary | ICD-10-CM | POA: Diagnosis not present

## 2022-07-23 DIAGNOSIS — K625 Hemorrhage of anus and rectum: Secondary | ICD-10-CM | POA: Insufficient documentation

## 2022-07-23 DIAGNOSIS — R1031 Right lower quadrant pain: Secondary | ICD-10-CM | POA: Insufficient documentation

## 2022-07-23 DIAGNOSIS — K5904 Chronic idiopathic constipation: Secondary | ICD-10-CM | POA: Insufficient documentation

## 2022-07-26 ENCOUNTER — Other Ambulatory Visit: Payer: Self-pay | Admitting: Family Medicine

## 2022-07-26 DIAGNOSIS — Z1231 Encounter for screening mammogram for malignant neoplasm of breast: Secondary | ICD-10-CM

## 2022-07-26 NOTE — Progress Notes (Unsigned)
ACUTE VISIT No chief complaint on file.  HPI: [name] is a 77 y.o. female, who is here today complaining of *** HPI  Review of Systems See other pertinent positives and negatives in HPI.  Current Outpatient Medications on File Prior to Visit  Medication Sig Dispense Refill   carbamide peroxide (DEBROX) 6.5 % OTIC solution Place 5 drops into the left ear 2 (two) times daily. 15 mL 0   cetirizine (ZYRTEC) 10 MG tablet Take 10 mg by mouth daily.     esomeprazole (NEXIUM) 40 MG capsule Take 1 capsule by mouth once daily 90 capsule 3   FLUoxetine (PROZAC) 20 MG capsule Take 1 capsule (20 mg total) by mouth daily. 30 capsule 5   linaclotide (LINZESS) 290 MCG CAPS capsule Take 290 mcg by mouth daily before breakfast.     montelukast (SINGULAIR) 10 MG tablet TAKE 1 TABLET BY MOUTH AT BEDTIME 90 tablet 1   polyethylene glycol (MIRALAX) packet Take 17 g by mouth daily. 14 each 0   risperiDONE (RISPERDAL) 0.5 MG tablet 2 qhs 60 tablet 5   rosuvastatin (CRESTOR) 20 MG tablet Take 1 tablet by mouth once daily 90 tablet 2   TURMERIC PO Take by mouth.     No current facility-administered medications on file prior to visit.    Past Medical History:  Diagnosis Date   Anxiety    Frequent headaches    GERD (gastroesophageal reflux disease)    Hyperlipidemia    IBS (irritable bowel syndrome)    Pituitary insufficiency (HCC)    Thyroid disease    Allergies  Allergen Reactions   Other Other (See Comments)    Seasonal allergies   Gramineae Pollens Other (See Comments)    Sneezing, runny nose, watery eyes    Social History   Socioeconomic History   Marital status: Widowed    Spouse name: Not on file   Number of children: 2   Years of education: Not on file   Highest education level: Not on file  Occupational History   Occupation: retired  Tobacco Use   Smoking status: Never   Smokeless tobacco: Never  Vaping Use   Vaping Use: Never used  Substance and Sexual  Activity   Alcohol use: No   Drug use: No   Sexual activity: Never  Other Topics Concern   Not on file  Social History Narrative   Lives alone.  Lives near son.  Was in [place].   Social Determinants of Health   Financial Resource Strain: Low Risk  (11/27/2021)   Overall Financial Resource Strain (CARDIA)    Difficulty of Paying Living Expenses: Not hard at all  Food Insecurity: No Food Insecurity (11/27/2021)   Hunger Vital Sign    Worried About Running Out of Food in the Last Year: Never true    Ran Out of Food in the Last Year: Never true  Transportation Needs: No Transportation Needs (11/27/2021)   PRAPARE - Administrator, Civil Service (Medical): No    Lack of Transportation (Non-Medical): No  Physical Activity: Inactive (11/27/2021)   Exercise Vital Sign    Days of Exercise per Week: 0 days    Minutes of Exercise per Session: 0 min  Stress: No Stress Concern Present (11/27/2021)   [employer] of Occupational Health - Occupational Stress Questionnaire    Feeling of Stress : Not at all  Social Connections: Moderately Isolated (11/14/2020)   Social Connection and Isolation Panel [NHANES]  Frequency of Communication with Friends and Family: More than three times a week    Frequency of Social Gatherings with Friends and Family: Once a week    Attends Religious Services: 1 to 4 times per year    Active Member of [employer] or Organizations: No    Attends Banker Meetings: Never    Marital Status: Widowed    There were no vitals filed for this visit. There is no height or weight on file to calculate BMI.  Physical Exam  ASSESSMENT AND PLAN: There are no diagnoses linked to this encounter.  No follow-ups on file.  Brendan Gruwell G. [place], MD  [facility]. [place] office.  Discharge Instructions   None

## 2022-07-29 ENCOUNTER — Encounter: Payer: Self-pay | Admitting: Family Medicine

## 2022-07-29 ENCOUNTER — Telehealth: Payer: Self-pay

## 2022-07-29 ENCOUNTER — Ambulatory Visit (INDEPENDENT_AMBULATORY_CARE_PROVIDER_SITE_OTHER): Payer: Medicare HMO | Admitting: Family Medicine

## 2022-07-29 VITALS — BP 122/70 | HR 64 | Resp 16 | Ht 63.0 in | Wt 146.5 lb

## 2022-07-29 DIAGNOSIS — R42 Dizziness and giddiness: Secondary | ICD-10-CM | POA: Diagnosis not present

## 2022-07-29 DIAGNOSIS — F251 Schizoaffective disorder, depressive type: Secondary | ICD-10-CM

## 2022-07-29 DIAGNOSIS — N644 Mastodynia: Secondary | ICD-10-CM | POA: Diagnosis not present

## 2022-07-29 DIAGNOSIS — D352 Benign neoplasm of pituitary gland: Secondary | ICD-10-CM

## 2022-07-29 NOTE — Telephone Encounter (Signed)
I spoke with patient. She will come tomorrow around 10am for labs.

## 2022-07-29 NOTE — Assessment & Plan Note (Addendum)
Status post transsphenoidal resection of pituitary mass. She follows with endocrinology regularly. She has an appointment with new provider on 11/13/2022.

## 2022-07-29 NOTE — Patient Instructions (Addendum)
A few things to remember from today's visit:  Lightheadedness  Breast tenderness in female - Plan: MM 3D DIAGNOSTIC MAMMOGRAM BILATERAL BREAST  If you need refills for medications you take chronically, please call your pharmacy. Do not use My Chart to request refills or for acute issues that need immediate attention. If you send a my chart message, it may take a few days to be addressed, specially if I am not in the office.  Please be sure medication list is accurate. If a new problem present, please set up appointment sooner than planned today.

## 2022-07-29 NOTE — Telephone Encounter (Signed)
-----   Message from Betty G [place], MD sent at 07/29/2022  2:26 PM EDT ----- She mentioned lightheadedness during visit and review labs we have not check BMP and CBC for some time, can you please ask her to come to the lab today or earlier this week. Thanks, BJ

## 2022-07-29 NOTE — Assessment & Plan Note (Signed)
Problem seems to be chronic and stable. We discussed possible causes. She has mild anemia, intermittent. Last H/H 2 years ago 11.0/33.3. I do not think blood work is needed at this time. Brain MRI in 03/2020 showed moderate chronic small vessel ischemic changes of the pons and cerebral hemispheric white matter. Fall precautions discussed. Will plan on repeating blood work during her next visit, CPE in 11/2022. Instructed about warning signs.

## 2022-07-30 ENCOUNTER — Other Ambulatory Visit (INDEPENDENT_AMBULATORY_CARE_PROVIDER_SITE_OTHER): Payer: Medicare HMO

## 2022-07-30 DIAGNOSIS — R42 Dizziness and giddiness: Secondary | ICD-10-CM

## 2022-08-01 LAB — CBC WITH DIFFERENTIAL/PLATELET
Basophils Absolute: 0 10*3/uL (ref 0.0–0.1)
Basophils Relative: 0.6 % (ref 0.0–3.0)
Eosinophils Absolute: 0.2 10*3/uL (ref 0.0–0.7)
Eosinophils Relative: 3.5 % (ref 0.0–5.0)
HCT: 33.1 % — ABNORMAL LOW (ref 36.0–46.0)
Hemoglobin: 10.8 g/dL — ABNORMAL LOW (ref 12.0–15.0)
Lymphocytes Relative: 26.9 % (ref 12.0–46.0)
Lymphs Abs: 1.4 10*3/uL (ref 0.7–4.0)
MCHC: 32.7 g/dL (ref 30.0–36.0)
MCV: 87.2 fl (ref 78.0–100.0)
Monocytes Absolute: 0.4 10*3/uL (ref 0.1–1.0)
Monocytes Relative: 7.4 % (ref 3.0–12.0)
Neutro Abs: 3.1 10*3/uL (ref 1.4–7.7)
Neutrophils Relative %: 61.6 % (ref 43.0–77.0)
Platelets: 175 10*3/uL (ref 150.0–400.0)
RBC: 3.79 Mil/uL — ABNORMAL LOW (ref 3.87–5.11)
RDW: 15.2 % (ref 11.5–15.5)
WBC: 5 10*3/uL (ref 4.0–10.5)

## 2022-08-01 LAB — BASIC METABOLIC PANEL
BUN: 14 mg/dL (ref 6–23)
CO2: 28 mEq/L (ref 19–32)
Calcium: 9.7 mg/dL (ref 8.4–10.5)
Chloride: 104 mEq/L (ref 96–112)
Creatinine, Ser: 1.18 mg/dL (ref 0.40–1.20)
GFR: 45.36 mL/min — ABNORMAL LOW (ref >60.00)
Glucose, Bld: 115 mg/dL — ABNORMAL HIGH (ref 70–99)
Potassium: 4 mEq/L (ref 3.5–5.1)
Sodium: 139 mEq/L (ref 135–145)

## 2022-08-02 ENCOUNTER — Encounter: Payer: Self-pay | Admitting: Gastroenterology

## 2022-08-02 ENCOUNTER — Ambulatory Visit (AMBULATORY_SURGERY_CENTER): Payer: Medicare HMO

## 2022-08-02 VITALS — Ht 63.0 in | Wt 145.0 lb

## 2022-08-02 DIAGNOSIS — Z860100 Personal history of colon polyps, unspecified: Secondary | ICD-10-CM

## 2022-08-02 DIAGNOSIS — K581 Irritable bowel syndrome with constipation: Secondary | ICD-10-CM

## 2022-08-02 NOTE — Progress Notes (Signed)
No egg or soy allergy known to patient  No issues known to pt with past sedation with any surgeries or procedures Patient denies ever being told they had issues or difficulty with intubation  No FH of Malignant Hyperthermia Pt is not on diet pills Pt is not on  home 02  Pt is not on blood thinners  Pt denies issues with constipation  No A fib or A flutter Have any cardiac testing pending--no  LOA: independent  Prep: Plenvu xtra miralax   Patient's chart reviewed by Cathlyn Parsons CNRA prior to previsit and patient appropriate for the [facility].  Previsit completed and red dot placed by patient's name on their procedure day (on provider's schedule).     PV competed with patient. Prep instructions sent via mychart. Patient to pick up prep instructions and plenvu sample from 2nd floor lobby.

## 2022-08-07 ENCOUNTER — Other Ambulatory Visit: Payer: Self-pay | Admitting: Family Medicine

## 2022-08-07 ENCOUNTER — Telehealth: Payer: Self-pay | Admitting: Family Medicine

## 2022-08-07 ENCOUNTER — Encounter: Payer: Self-pay | Admitting: Family Medicine

## 2022-08-07 DIAGNOSIS — F251 Schizoaffective disorder, depressive type: Secondary | ICD-10-CM

## 2022-08-07 DIAGNOSIS — N644 Mastodynia: Secondary | ICD-10-CM

## 2022-08-07 DIAGNOSIS — R599 Enlarged lymph nodes, unspecified: Secondary | ICD-10-CM

## 2022-08-07 DIAGNOSIS — R42 Dizziness and giddiness: Secondary | ICD-10-CM

## 2022-08-07 DIAGNOSIS — D352 Benign neoplasm of pituitary gland: Secondary | ICD-10-CM

## 2022-08-07 DIAGNOSIS — N183 Chronic kidney disease, stage 3 unspecified: Secondary | ICD-10-CM | POA: Insufficient documentation

## 2022-08-07 NOTE — Telephone Encounter (Signed)
I called and spoke with patient. PCP recommends 500 mg of Vitamin C. Patient also had a question on her mammogram, I let patient know that the correct order has been placed and she should be able to call and schedule it now. Patient verbalized understanding.

## 2022-08-07 NOTE — Telephone Encounter (Signed)
Pt called to confirm how much Vitamin C MD wants her to take?  CMA was with a patient.   Please return Pt's call at your earliest convenience.

## 2022-08-09 ENCOUNTER — Telehealth: Payer: Self-pay | Admitting: Family Medicine

## 2022-08-09 NOTE — Telephone Encounter (Signed)
Pt states she was told to take vitamin C but does know the dosage. Pt requests call

## 2022-08-12 NOTE — Telephone Encounter (Signed)
I spoke with patient. She is aware that it is 500 mg of Vitamin C.

## 2022-08-21 ENCOUNTER — Other Ambulatory Visit: Payer: Self-pay | Admitting: Family Medicine

## 2022-08-21 ENCOUNTER — Ambulatory Visit
Admission: RE | Admit: 2022-08-21 | Discharge: 2022-08-21 | Disposition: A | Discharge status: Home / Self Care | Payer: Medicare HMO | Source: Ambulatory Visit | Attending: Family Medicine | Admitting: Family Medicine

## 2022-08-21 DIAGNOSIS — R599 Enlarged lymph nodes, unspecified: Secondary | ICD-10-CM

## 2022-08-21 DIAGNOSIS — N644 Mastodynia: Secondary | ICD-10-CM

## 2022-08-21 DIAGNOSIS — R921 Mammographic calcification found on diagnostic imaging of breast: Secondary | ICD-10-CM | POA: Diagnosis not present

## 2022-08-22 DIAGNOSIS — E785 Hyperlipidemia, unspecified: Secondary | ICD-10-CM | POA: Diagnosis not present

## 2022-08-22 DIAGNOSIS — H547 Unspecified visual loss: Secondary | ICD-10-CM | POA: Diagnosis not present

## 2022-08-22 DIAGNOSIS — M8580 Other specified disorders of bone density and structure, unspecified site: Secondary | ICD-10-CM | POA: Diagnosis not present

## 2022-08-22 DIAGNOSIS — Z008 Encounter for other general examination: Secondary | ICD-10-CM | POA: Diagnosis not present

## 2022-08-22 DIAGNOSIS — K589 Irritable bowel syndrome without diarrhea: Secondary | ICD-10-CM | POA: Diagnosis not present

## 2022-08-22 DIAGNOSIS — F319 Bipolar disorder, unspecified: Secondary | ICD-10-CM | POA: Diagnosis not present

## 2022-08-22 DIAGNOSIS — K219 Gastro-esophageal reflux disease without esophagitis: Secondary | ICD-10-CM | POA: Diagnosis not present

## 2022-08-22 DIAGNOSIS — H269 Unspecified cataract: Secondary | ICD-10-CM | POA: Diagnosis not present

## 2022-08-22 DIAGNOSIS — J302 Other seasonal allergic rhinitis: Secondary | ICD-10-CM | POA: Diagnosis not present

## 2022-08-22 DIAGNOSIS — R32 Unspecified urinary incontinence: Secondary | ICD-10-CM | POA: Diagnosis not present

## 2022-08-23 ENCOUNTER — Ambulatory Visit
Admission: RE | Admit: 2022-08-23 | Discharge: 2022-08-23 | Disposition: A | Discharge status: Home / Self Care | Payer: Medicare HMO | Source: Ambulatory Visit | Attending: Family Medicine | Admitting: Family Medicine

## 2022-08-23 DIAGNOSIS — R921 Mammographic calcification found on diagnostic imaging of breast: Secondary | ICD-10-CM

## 2022-08-23 HISTORY — PX: BREAST BIOPSY: SHX20

## 2022-09-04 ENCOUNTER — Ambulatory Visit (AMBULATORY_SURGERY_CENTER): Payer: Medicare HMO | Admitting: Gastroenterology

## 2022-09-04 ENCOUNTER — Encounter: Payer: Self-pay | Admitting: Gastroenterology

## 2022-09-04 ENCOUNTER — Other Ambulatory Visit: Payer: Self-pay | Admitting: Family Medicine

## 2022-09-04 VITALS — BP 119/65 | HR 76 | Temp 98.0°F | Resp 17 | Ht 63.0 in | Wt 145.0 lb

## 2022-09-04 DIAGNOSIS — F419 Anxiety disorder, unspecified: Secondary | ICD-10-CM | POA: Diagnosis not present

## 2022-09-04 DIAGNOSIS — Z09 Encounter for follow-up examination after completed treatment for conditions other than malignant neoplasm: Secondary | ICD-10-CM

## 2022-09-04 DIAGNOSIS — E785 Hyperlipidemia, unspecified: Secondary | ICD-10-CM | POA: Diagnosis not present

## 2022-09-04 DIAGNOSIS — K635 Polyp of colon: Secondary | ICD-10-CM | POA: Diagnosis not present

## 2022-09-04 DIAGNOSIS — D125 Benign neoplasm of sigmoid colon: Secondary | ICD-10-CM

## 2022-09-04 DIAGNOSIS — Z860100 Personal history of colon polyps, unspecified: Secondary | ICD-10-CM

## 2022-09-04 DIAGNOSIS — J309 Allergic rhinitis, unspecified: Secondary | ICD-10-CM

## 2022-09-04 MED ORDER — SODIUM CHLORIDE 0.9 % IV SOLN: 500.0000 mL | Freq: Once | INTRAVENOUS | Status: DC

## 2022-09-04 NOTE — Progress Notes (Signed)
[facility] History and Physical   Primary Care Physician:  [place], Betty G, MD   Reason for Procedure:  History of adenomatous colon polyps  Plan:    Surveillance colonoscopy with possible interventions as needed     HPI: Nancy Herman is a very pleasant 77 y.o. female here for surveillance colonoscopy. Denies any nausea, vomiting, abdominal pain, melena or bright red blood per rectum  The risks and benefits as well as alternatives of endoscopic procedure(s) have been discussed and reviewed. All questions answered. The patient agrees to proceed.    Past Medical History:  Diagnosis Date   Anxiety    Frequent headaches    GERD (gastroesophageal reflux disease)    Hyperlipidemia    IBS (irritable bowel syndrome)    Pituitary insufficiency (HCC)    Thyroid disease     Past Surgical History:  Procedure Laterality Date   ABDOMINAL HYSTERECTOMY  1990   ANAL RECTAL MANOMETRY N/A 08/14/2018   Procedure: ANO RECTAL MANOMETRY;  Surgeon: Napoleon Form, MD;  Location: [facility];  Service: Endoscopy;  Laterality: N/A;   BRAIN SURGERY     BREAST BIOPSY Left 08/23/2022   MM LT BREAST BX W LOC DEV 1ST LESION IMAGE BX SPEC STEREO GUIDE 08/23/2022 [facility]   BREAST EXCISIONAL BIOPSY Right unsure   BREAST EXCISIONAL BIOPSY Left unsure   eyelid surgery Bilateral 08/27/2021   OVARIAN CYST SURGERY  1994   REPAIR RECTOCELE  2016   and prolapsed uterus.   TONSILLECTOMY  1973    Prior to Admission medications   Medication Sig Start Date End Date Taking? Authorizing Provider  cetirizine (ZYRTEC) 10 MG tablet Take 10 mg by mouth daily.   Yes [provider]  esomeprazole (NEXIUM) 40 MG capsule Take 1 capsule by mouth once daily 01/25/22  Yes [place], Betty G, MD  FLUoxetine (PROZAC) 20 MG capsule Take 1 capsule (20 mg total) by mouth daily. 04/10/22  Yes Plovsky, Earvin Hansen, MD  linaclotide Karlene Einstein) 290 MCG CAPS capsule Take 290 mcg by mouth daily  before breakfast.   Yes [provider]  polyethylene glycol (MIRALAX) packet Take 17 g by mouth daily. 02/22/18  Yes Petrucelli, Samantha R, PA-C  risperiDONE (RISPERDAL) 0.5 MG tablet 2 qhs 04/10/22  Yes Plovsky, Earvin Hansen, MD  rosuvastatin (CRESTOR) 20 MG tablet Take 1 tablet by mouth once daily 03/01/22  Yes [place], Betty G, MD  TURMERIC PO Take by mouth.   Yes [provider]  carbamide peroxide (DEBROX) 6.5 % OTIC solution Place 5 drops into the left ear 2 (two) times daily. Patient not taking: Reported on 08/02/2022 05/10/19   [place], Betty G, MD  montelukast (SINGULAIR) 10 MG tablet TAKE 1 TABLET BY MOUTH AT BEDTIME 09/04/22   [place], Betty G, MD    Current Outpatient Medications  Medication Sig Dispense Refill   cetirizine (ZYRTEC) 10 MG tablet Take 10 mg by mouth daily.     esomeprazole (NEXIUM) 40 MG capsule Take 1 capsule by mouth once daily 90 capsule 3   FLUoxetine (PROZAC) 20 MG capsule Take 1 capsule (20 mg total) by mouth daily. 30 capsule 5   linaclotide (LINZESS) 290 MCG CAPS capsule Take 290 mcg by mouth daily before breakfast.     polyethylene glycol (MIRALAX) packet Take 17 g by mouth daily. 14 each 0   risperiDONE (RISPERDAL) 0.5 MG tablet 2 qhs 60 tablet 5   rosuvastatin (CRESTOR) 20 MG tablet Take 1 tablet by mouth once daily 90 tablet 2  TURMERIC PO Take by mouth.     carbamide peroxide (DEBROX) 6.5 % OTIC solution Place 5 drops into the left ear 2 (two) times daily. (Patient not taking: Reported on 08/02/2022) 15 mL 0   montelukast (SINGULAIR) 10 MG tablet TAKE 1 TABLET BY MOUTH AT BEDTIME 90 tablet 2   Current Facility-Administered Medications  Medication Dose Route Frequency Provider Last Rate Last Admin   0.9 %  sodium chloride infusion  500 mL Intravenous Once Napoleon Form, MD        Allergies as of 09/04/2022 - Review Complete 09/04/2022  Allergen Reaction Noted   Other Other (See Comments) 10/28/2017   Gramineae pollens Other (See  Comments) 08/15/2021    Family History  Problem Relation Age of Onset   Heart disease Mother        No details    Alcohol abuse Mother    Drug abuse Mother    Sickle cell anemia Mother    Heart disease Father        No details . Questionable bypass or stent   Drug abuse Father    Alcohol abuse Father    Colon cancer Maternal Aunt    Esophageal cancer Neg Hx    Pancreatic cancer Neg Hx    Stomach cancer Neg Hx     Social History   Socioeconomic History   Marital status: Widowed    Spouse name: Not on file   Number of children: 2   Years of education: Not on file   Highest education level: Not on file  Occupational History   Occupation: retired  Tobacco Use   Smoking status: Never   Smokeless tobacco: Never  Vaping Use   Vaping status: Never Used  Substance and Sexual Activity   Alcohol use: No   Drug use: No   Sexual activity: Never  Other Topics Concern   Not on file  Social History Narrative   Lives alone.  Lives near son.  Was in [place].   Social Determinants of Health   Financial Resource Strain: Low Risk  (11/27/2021)   Overall Financial Resource Strain (CARDIA)    Difficulty of Paying Living Expenses: Not hard at all  Food Insecurity: No Food Insecurity (11/27/2021)   Hunger Vital Sign    Worried About Running Out of Food in the Last Year: Never true    Ran Out of Food in the Last Year: Never true  Transportation Needs: No Transportation Needs (11/27/2021)   PRAPARE - Administrator, Civil Service (Medical): No    Lack of Transportation (Non-Medical): No  Physical Activity: Inactive (11/27/2021)   Exercise Vital Sign    Days of Exercise per Week: 0 days    Minutes of Exercise per Session: 0 min  Stress: No Stress Concern Present (11/27/2021)   [employer] of Occupational Health - Occupational Stress Questionnaire    Feeling of Stress : Not at all  Social Connections: Moderately Isolated (11/14/2020)   Social Connection and Isolation  Panel [NHANES]    Frequency of Communication with Friends and Family: More than three times a week    Frequency of Social Gatherings with Friends and Family: Once a week    Attends Religious Services: 1 to 4 times per year    Active Member of [employer] or Organizations: No    Attends Banker Meetings: Never    Marital Status: Widowed  Intimate Partner Violence: Not At Risk (11/14/2020)   Humiliation, Afraid, Rape, and Kick questionnaire  Fear of Current or Ex-Partner: No    Emotionally Abused: No    Physically Abused: No    Sexually Abused: No    Review of Systems:  All other review of systems negative except as mentioned in the HPI.  Physical Exam: Vital signs in last 24 hours: BP (!) 147/76   Pulse (!) 110   Temp 98 F (36.7 C)   Resp 19   Ht 5\' 3"  (1.6 m)   Wt 145 lb (65.8 kg)   LMP  (LMP Unknown)   SpO2 100%   BMI 25.69 kg/m  General:   Alert, NAD Lungs:  Clear .   Heart:  Regular rate and rhythm Abdomen:  Soft, nontender and nondistended. Neuro/Psych:  Alert and cooperative. Normal mood and affect. A and O x 3  Reviewed labs, radiology imaging, old records and pertinent past GI work up  Patient is appropriate for planned procedure(s) and anesthesia in an ambulatory setting   K. Scherry Ran , MD [phone]

## 2022-09-04 NOTE — Progress Notes (Signed)
Pt's states no medical or surgical changes since previsit or office visit. 

## 2022-09-04 NOTE — Op Note (Signed)
[facility] Patient Name: Nancy Herman Procedure Date: 09/04/2022 6:59 AM MRN: [id number] Endoscopist: Napoleon Form , MD, [id number] Age: 77 Referring MD:  Date of Birth: [birth year] Gender: Female Account #: [id number] Procedure:                Colonoscopy Indications:              High risk colon cancer surveillance: Personal                            history of colonic polyps, High risk colon cancer                            surveillance: Personal history of adenoma (10 mm or                            greater in size), High risk colon cancer                            surveillance: Personal history of multiple (3 or                            more) adenomas Medicines:                Monitored Anesthesia Care Procedure:                Pre-Anesthesia Assessment:                           - Prior to the procedure, a History and Physical                            was performed, and patient medications and                            allergies were reviewed. The patient's tolerance of                            previous anesthesia was also reviewed. The risks                            and benefits of the procedure and the sedation                            options and risks were discussed with the patient.                            All questions were answered, and informed consent                            was obtained. Prior Anticoagulants: The patient has                            taken no anticoagulant or antiplatelet agents. ASA  Grade Assessment: II - A patient with mild systemic                            disease. After reviewing the risks and benefits,                            the patient was deemed in satisfactory condition to                            undergo the procedure.                           After obtaining informed consent, the colonoscope                            was passed under direct vision. Throughout the                             procedure, the patient's blood pressure, pulse, and                            oxygen saturations were monitored continuously. The                            [id number]-HQ190L Colonoscope [id number] was introduced                            through the anus and advanced to the the cecum,                            identified by appendiceal orifice and ileocecal                            valve. The colonoscopy was performed without                            difficulty. The patient tolerated the procedure                            well. The quality of the bowel preparation was                            good. The ileocecal valve, appendiceal orifice, and                            rectum were photographed. Scope In: 8:14:06 AM Scope Out: 8:27:55 AM Scope Withdrawal Time: 0 hours 7 minutes 30 seconds  Total Procedure Duration: 0 hours 13 minutes 49 seconds  Findings:                 The perianal and digital rectal examinations were                            normal.  Two sessile polyps were found in the sigmoid colon.                            The polyps were 3 to 4 mm in size. These polyps                            were removed with a cold snare. Resection and                            retrieval were complete.                           A few small-mouthed diverticula were found in the                            sigmoid colon and descending colon.                           Non-bleeding external and internal hemorrhoids were                            found during retroflexion. The hemorrhoids were                            medium-sized. Complications:            No immediate complications. Estimated Blood Loss:     Estimated blood loss was minimal. Impression:               - Two 3 to 4 mm polyps in the sigmoid colon,                            removed with a cold snare. Resected and retrieved.                           - Diverticulosis in  the sigmoid colon and in the                            descending colon.                           - Non-bleeding external and internal hemorrhoids. Recommendation:           - Patient has a contact number available for                            emergencies. The signs and symptoms of potential                            delayed complications were discussed with the                            patient. Return to normal activities tomorrow.  Written discharge instructions were provided to the                            patient.                           - Resume previous diet.                           - Continue present medications.                           - Await pathology results.                           - No repeat colonoscopy due to age. Napoleon Form, MD 09/04/2022 8:35:39 AM This report has been signed electronically.

## 2022-09-04 NOTE — Progress Notes (Signed)
Sedate, gd SR, tolerated procedure well, VSS, report to RN 

## 2022-09-04 NOTE — Progress Notes (Signed)
Called to room to assist during endoscopic procedure.  Patient ID and intended procedure confirmed with present staff. Received instructions for my participation in the procedure from the performing physician.  

## 2022-09-04 NOTE — Patient Instructions (Signed)
Await pathology results.  Continue present medications.  Handout on polyps, diverticulosis, and hemorrhoids provided.  YOU HAD AN ENDOSCOPIC PROCEDURE TODAY AT THE [facility]:   Refer to the procedure report that was given to you for any specific questions about what was found during the examination.  If the procedure report does not answer your questions, please call your gastroenterologist to clarify.  If you requested that your care partner not be given the details of your procedure findings, then the procedure report has been included in a sealed envelope for you to review at your convenience later.  YOU SHOULD EXPECT: Some feelings of bloating in the abdomen. Passage of more gas than usual.  Walking can help get rid of the air that was put into your GI tract during the procedure and reduce the bloating. If you had a lower endoscopy (such as a colonoscopy or flexible sigmoidoscopy) you may notice spotting of blood in your stool or on the toilet paper. If you underwent a bowel prep for your procedure, you may not have a normal bowel movement for a few days.  Please Note:  You might notice some irritation and congestion in your nose or some drainage.  This is from the oxygen used during your procedure.  There is no need for concern and it should clear up in a day or so.  SYMPTOMS TO REPORT IMMEDIATELY:  Following lower endoscopy (colonoscopy or flexible sigmoidoscopy):  Excessive amounts of blood in the stool  Significant tenderness or worsening of abdominal pains  Swelling of the abdomen that is new, acute  Fever of 100F or higher   For urgent or emergent issues, a gastroenterologist can be reached at any hour by calling [phone]. Do not use MyChart messaging for urgent concerns.    DIET:  We do recommend a small meal at first, but then you may proceed to your regular diet.  Drink plenty of fluids but you should avoid alcoholic beverages for 24 hours.  ACTIVITY:   You should plan to take it easy for the rest of today and you should NOT DRIVE or use heavy machinery until tomorrow (because of the sedation medicines used during the test).    FOLLOW UP: Our staff will call the number listed on your records the next business day following your procedure.  We will call around 7:15- 8:00 am to check on you and address any questions or concerns that you may have regarding the information given to you following your procedure. If we do not reach you, we will leave a message.     If any biopsies were taken you will be contacted by phone or by letter within the next 1-3 weeks.  Please call us at [phone] if you have not heard about the biopsies in 3 weeks.    SIGNATURES/CONFIDENTIALITY: You and/or your care partner have signed paperwork which will be entered into your electronic medical record.  These signatures attest to the fact that that the information above on your After Visit Summary has been reviewed and is understood.  Full responsibility of the confidentiality of this discharge information lies with you and/or your care-partner.

## 2022-09-06 ENCOUNTER — Telehealth: Payer: Self-pay | Admitting: *Deleted

## 2022-09-06 NOTE — Telephone Encounter (Signed)
  Follow up Call-     09/04/2022    7:13 AM  Call back number  Post procedure Call Back phone  # [phone]  Permission to leave phone message Yes     Patient questions:  Do you have a fever, pain , or abdominal swelling? No. Pain Score  0 *  Have you tolerated food without any problems? Yes.    Have you been able to return to your normal activities? Yes.    Do you have any questions about your discharge instructions: Diet   No. Medications  No. Follow up visit  No.  Do you have questions or concerns about your Care? No.  Actions: * If pain score is 4 or above: No action needed, pain <4.

## 2022-09-11 ENCOUNTER — Ambulatory Visit (HOSPITAL_COMMUNITY): Payer: Medicare HMO | Admitting: Psychiatry

## 2022-09-25 ENCOUNTER — Encounter: Payer: Self-pay | Admitting: Gastroenterology

## 2022-09-25 ENCOUNTER — Telehealth: Payer: Self-pay | Admitting: Gastroenterology

## 2022-09-25 NOTE — Telephone Encounter (Signed)
Inbound call from patient wishing to speak about 7/31 colonoscopy results.  Please advise, thank you.

## 2022-09-25 NOTE — Telephone Encounter (Signed)
Patient was unsure what was meant by the term "pre-cancerous." She was reviewing her result letter from the colonoscopy done 09/04/22. Questions invited and answered.

## 2022-10-15 ENCOUNTER — Encounter (HOSPITAL_COMMUNITY): Payer: Self-pay | Admitting: Psychiatry

## 2022-10-15 ENCOUNTER — Other Ambulatory Visit: Payer: Self-pay

## 2022-10-15 ENCOUNTER — Ambulatory Visit (HOSPITAL_BASED_OUTPATIENT_CLINIC_OR_DEPARTMENT_OTHER): Payer: Medicare HMO | Admitting: Psychiatry

## 2022-10-15 VITALS — BP 112/68 | HR 69 | Ht 63.0 in | Wt 146.0 lb

## 2022-10-15 DIAGNOSIS — F411 Generalized anxiety disorder: Secondary | ICD-10-CM | POA: Diagnosis not present

## 2022-10-15 DIAGNOSIS — F325 Major depressive disorder, single episode, in full remission: Secondary | ICD-10-CM

## 2022-10-15 DIAGNOSIS — F32A Depression, unspecified: Secondary | ICD-10-CM

## 2022-10-15 MED ORDER — RISPERIDONE 0.5 MG PO TABS: ORAL_TABLET | ORAL | 7 refills | Status: DC

## 2022-10-15 MED ORDER — FLUOXETINE HCL 20 MG PO CAPS
20.0000 mg | ORAL_CAPSULE | Freq: Every day | ORAL | 7 refills | Status: AC
Start: 2022-10-15 — End: ?

## 2022-10-15 NOTE — Progress Notes (Signed)
Psychiatric Initial Adult Assessment   Patient Identification: LUCETTE PETRUSO MRN:  [id number] Date of Evaluation:  10/15/2022 Referral Source: Dr. Betty Swaziland Chief Complaint:     Visit Diagnosis:    ICD-10-CM   1. Generalized anxiety disorder  F41.1 FLUoxetine (PROZAC) 20 MG capsule    2. Depressive disorder  F32.A FLUoxetine (PROZAC) 20 MG capsule       History of Present Illness:     Today the patient is doing very well.  She denies any depression.  She denies any mood and anxiety.  She sleeps well.  Curiously she however takes a 2-hour nap every day during the afternoon.  This does not seem to affect her sleep at night.  The patient enjoys life.  She watches TV game shows some soup and soaps.  She also goes to a number of meetings.  She is actively involved in her church.  She has a dog named Chowbey that she likes.  The patient drives without a problem.  She likes to cook.  The patient has 2 adult children and 5 grandchildren all of whom are doing well.  On her last visit we changed her Risperdal to taking it all at night hoping that she would be less sedated during the day.  It actually did not work.  On the other hand she is very compliant with her medicine.  She takes Risperdal 0.5 mg 2 at night.  He takes Prozac 20 mg a day.  Today she had an aims scale demonstrated no evidence of tardive dyskinesia.  The patient's biggest rest is that she has had problems with her roof.  She is having a leak.  The HOA is supposedly going to fix it.  The patient is bothered by this.  Overall though the patient has few stresses.  Since I have seen her she has had a negative colonoscopy and a negative mammogram.    Depression Symptoms:  difficulty concentrating, (Hypo) Manic Symptoms:   Anxiety Symptoms:   Psychotic Symptoms:   PTSD Symptoms:   Past Psychiatric History: Prozac for many years  Previous Psychotropic Medications:   Substance Abuse History in the last 12 months:   No.  Consequences of Substance Abuse:   Past Medical History:  Past Medical History:  Diagnosis Date   Anxiety    Frequent headaches    GERD (gastroesophageal reflux disease)    Hyperlipidemia    IBS (irritable bowel syndrome)    Pituitary insufficiency (HCC)    Thyroid disease     Past Surgical History:  Procedure Laterality Date   ABDOMINAL HYSTERECTOMY  1990   ANAL RECTAL MANOMETRY N/A 08/14/2018   Procedure: ANO RECTAL MANOMETRY;  Surgeon: Napoleon Form, MD;  Location: [facility];  Service: Endoscopy;  Laterality: N/A;   BRAIN SURGERY     BREAST BIOPSY Left 08/23/2022   MM LT BREAST BX W LOC DEV 1ST LESION IMAGE BX SPEC STEREO GUIDE 08/23/2022 [facility]   BREAST EXCISIONAL BIOPSY Right unsure   BREAST EXCISIONAL BIOPSY Left unsure   eyelid surgery Bilateral 08/27/2021   OVARIAN CYST SURGERY  1994   REPAIR RECTOCELE  2016   and prolapsed uterus.   TONSILLECTOMY  1973    Family Psychiatric History:   Family History:  Family History  Problem Relation Age of Onset   Heart disease Mother        No details    Alcohol abuse Mother    Drug abuse Mother    Sickle cell anemia  Mother    Heart disease Father        No details . Questionable bypass or stent   Drug abuse Father    Alcohol abuse Father    Colon cancer Maternal Aunt    Esophageal cancer Neg Hx    Pancreatic cancer Neg Hx    Stomach cancer Neg Hx     Social History:   Social History   Socioeconomic History   Marital status: Widowed    Spouse name: Not on file   Number of children: 2   Years of education: Not on file   Highest education level: Not on file  Occupational History   Occupation: retired  Tobacco Use   Smoking status: Never   Smokeless tobacco: Never  Vaping Use   Vaping status: Never Used  Substance and Sexual Activity   Alcohol use: No   Drug use: No   Sexual activity: Never  Other Topics Concern   Not on file  Social History Narrative   Lives alone.  Lives  near son.  Was in [place].   Social Determinants of Health   Financial Resource Strain: Low Risk  (11/27/2021)   Overall Financial Resource Strain (CARDIA)    Difficulty of Paying Living Expenses: Not hard at all  Food Insecurity: No Food Insecurity (11/27/2021)   Hunger Vital Sign    Worried About Running Out of Food in the Last Year: Never true    Ran Out of Food in the Last Year: Never true  Transportation Needs: No Transportation Needs (11/27/2021)   PRAPARE - Administrator, Civil Service (Medical): No    Lack of Transportation (Non-Medical): No  Physical Activity: Inactive (11/27/2021)   Exercise Vital Sign    Days of Exercise per Week: 0 days    Minutes of Exercise per Session: 0 min  Stress: No Stress Concern Present (11/27/2021)   [employer] of Occupational Health - Occupational Stress Questionnaire    Feeling of Stress : Not at all  Social Connections: Moderately Isolated (11/14/2020)   Social Connection and Isolation Panel [NHANES]    Frequency of Communication with Friends and Family: More than three times a week    Frequency of Social Gatherings with Friends and Family: Once a week    Attends Religious Services: 1 to 4 times per year    Active Member of [employer] or Organizations: No    Attends Banker Meetings: Never    Marital Status: Widowed    Additional Social History:   Allergies:   Allergies  Allergen Reactions   Other Other (See Comments)    Seasonal allergies   Gramineae Pollens Other (See Comments)    Sneezing, runny nose, watery eyes    Metabolic Disorder Labs: Lab Results  Component Value Date   HGBA1C 6.1 11/09/2021   Lab Results  Component Value Date   PROLACTIN 21.4 02/28/2020   PROLACTIN 14.6 03/05/2019   Lab Results  Component Value Date   CHOL 190 02/14/2022   TRIG 76.0 02/14/2022   HDL 77.80 02/14/2022   CHOLHDL 2 02/14/2022   VLDL 15.2 02/14/2022   LDLCALC 97 02/14/2022   LDLCALC 140 (H) 11/09/2021    Lab Results  Component Value Date   TSH 3.10 03/05/2021    Therapeutic Level Labs: No results found for: "LITHIUM" No results found for: "CBMZ" No results found for: "VALPROATE"  Current Medications: Current Outpatient Medications  Medication Sig Dispense Refill   carbamide peroxide (DEBROX) 6.5 % OTIC solution  Place 5 drops into the left ear 2 (two) times daily. 15 mL 0   cetirizine (ZYRTEC) 10 MG tablet Take 10 mg by mouth daily.     esomeprazole (NEXIUM) 40 MG capsule Take 1 capsule by mouth once daily 90 capsule 3   linaclotide (LINZESS) 290 MCG CAPS capsule Take 290 mcg by mouth daily before breakfast.     montelukast (SINGULAIR) 10 MG tablet TAKE 1 TABLET BY MOUTH AT BEDTIME 90 tablet 2   polyethylene glycol (MIRALAX) packet Take 17 g by mouth daily. 14 each 0   rosuvastatin (CRESTOR) 20 MG tablet Take 1 tablet by mouth once daily 90 tablet 2   TURMERIC PO Take by mouth.     FLUoxetine (PROZAC) 20 MG capsule Take 1 capsule (20 mg total) by mouth daily. 30 capsule 7   risperiDONE (RISPERDAL) 0.5 MG tablet 2 qhs 60 tablet 7   No current facility-administered medications for this visit.    Musculoskeletal: Strength & Muscle Tone: within normal limits Gait & Station: normal Patient leans:   Psychiatric Specialty Exam: ROS  Blood pressure 112/68, pulse 69, height 5\' 3"  (1.6 m), weight 146 lb (66.2 kg).Body mass index is 25.86 kg/m.  General Appearance: Casual  Eye Contact:  Good  Speech: Good  Volume:  Normal  Mood:  NA  Affect:  NA and Appropriate  Thought Process:  Coherent  Orientation:  Full (Time, Place, and Person)  Thought Content:  Logical  Suicidal Thoughts:  No  Homicidal Thoughts:  No  Memory:  Negative  Judgement:  Good  Insight:  Fair  Psychomotor Activity:  Normal  Concentration:    Recall:  Good  Fund of Knowledge:Good  Language: Good  Akathisia:  No  Handed:    AIMS (if indicated):  not done  Assets:  Desire for Improvement  ADL's:   Intact  Cognition: WNL  Sleep:  Good   Screenings: GAD-7    Flowsheet Row Office Visit from 07/29/2022 in [facility] [place]  Total GAD-7 Score 2      PHQ2-9    Flowsheet Row Office Visit from 07/29/2022 in [facility] [place] Clinical Support from 11/27/2021 in [facility] Office Visit from 11[facility]09/2021 in [facility] [place] Office Visit from 08/28/2021 in [facility] [place] Clinical Support from 11/14/2020 in [facility] [place]  PHQ-2 Total Score 1 0 0 2 0  PHQ-9 Total Score 5 -- 1 10 --      Flowsheet Row ED from 03/16/2018 in [facility] at [facility] ED from 02/22/2018 in [facility] at [facility]  C-SSRS RISK CATEGORY No Risk No Risk       Assessment and Plan:  9/10/20243:29 PM   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  She demonstrates no evidence of psychosis at this time.  In the past year or so we attempted to discontinue her Risperdal but her paranoia and suspiciousness returned.  She is now back to Risperdal and she feels very peace and no longer suspicious or paranoid.  For now we will continue it as it is.  She has no evidence of tardive dyskinesia.  She will continue taking Prozac 20 mg.  She will return to see me in 6 months.  Patient is very stable.

## 2022-11-11 ENCOUNTER — Ambulatory Visit (INDEPENDENT_AMBULATORY_CARE_PROVIDER_SITE_OTHER): Payer: Medicare HMO | Admitting: Family Medicine

## 2022-11-11 ENCOUNTER — Encounter: Payer: Self-pay | Admitting: Family Medicine

## 2022-11-11 VITALS — BP 120/72 | HR 67 | Temp 98.1°F | Resp 16 | Ht 63.0 in | Wt 144.0 lb

## 2022-11-11 DIAGNOSIS — K219 Gastro-esophageal reflux disease without esophagitis: Secondary | ICD-10-CM

## 2022-11-11 DIAGNOSIS — Z23 Encounter for immunization: Secondary | ICD-10-CM | POA: Diagnosis not present

## 2022-11-11 DIAGNOSIS — E785 Hyperlipidemia, unspecified: Secondary | ICD-10-CM

## 2022-11-11 DIAGNOSIS — R7303 Prediabetes: Secondary | ICD-10-CM

## 2022-11-11 DIAGNOSIS — D509 Iron deficiency anemia, unspecified: Secondary | ICD-10-CM | POA: Diagnosis not present

## 2022-11-11 DIAGNOSIS — M85851 Other specified disorders of bone density and structure, right thigh: Secondary | ICD-10-CM

## 2022-11-11 DIAGNOSIS — Z0000 Encounter for general adult medical examination without abnormal findings: Secondary | ICD-10-CM

## 2022-11-11 DIAGNOSIS — I700 Atherosclerosis of aorta: Secondary | ICD-10-CM

## 2022-11-11 DIAGNOSIS — N1831 Chronic kidney disease, stage 3a: Secondary | ICD-10-CM | POA: Diagnosis not present

## 2022-11-11 LAB — LIPID PANEL
Cholesterol: 186 mg/dL (ref 0–200)
HDL: 87.6 mg/dL (ref >39.00)
LDL Cholesterol: 87 mg/dL (ref 0–99)
NonHDL: 98.78
Total CHOL/HDL Ratio: 2
Triglycerides: 58 mg/dL (ref 0.0–149.0)
VLDL: 11.6 mg/dL (ref 0.0–40.0)

## 2022-11-11 LAB — BASIC METABOLIC PANEL
BUN: 10 mg/dL (ref 6–23)
CO2: 27 meq/L (ref 19–32)
Calcium: 10 mg/dL (ref 8.4–10.5)
Chloride: 107 meq/L (ref 96–112)
Creatinine, Ser: 1.06 mg/dL (ref 0.40–1.20)
GFR: 51.49 mL/min — ABNORMAL LOW (ref >60.00)
Glucose, Bld: 94 mg/dL (ref 70–99)
Potassium: 4.1 meq/L (ref 3.5–5.1)
Sodium: 144 meq/L (ref 135–145)

## 2022-11-11 LAB — VITAMIN D 25 HYDROXY (VIT D DEFICIENCY, FRACTURES): VITD: 49.22 ng/mL (ref 30.00–100.00)

## 2022-11-11 LAB — HEMOGLOBIN A1C: Hgb A1c MFr Bld: 6 % (ref 4.6–6.5)

## 2022-11-11 NOTE — Assessment & Plan Note (Signed)
Hemoglobin A1c 6.1 in 11/2021. Encourage consistency with a healthy lifestyle for diabetes prevention. Further recommendation will be given according to hemoglobin A1c.

## 2022-11-11 NOTE — Assessment & Plan Note (Signed)
Hx of GI bleeding in 07/2022. Continue iron supplementation every other day with vitamin C. Further recommendation will be given according to lab results.

## 2022-11-11 NOTE — Assessment & Plan Note (Signed)
Continue adequate calcium and vitamin D supplementation as well as fall prevention. Recommend weightbearing exercise in a few times per week. DEXA will be arranged.

## 2022-11-11 NOTE — Assessment & Plan Note (Signed)
Problem is stable. Continue Nexium 40 mg daily and GERD precautions. She follows with GI as needed.

## 2022-11-11 NOTE — Assessment & Plan Note (Signed)
We discussed the importance of regular physical activity and healthy diet for prevention of chronic illness and/or complications. Preventive guidelines reviewed. Vaccination up to date. Ca++ and vit D supplementation to continue. Next CPE in a year. 

## 2022-11-11 NOTE — Assessment & Plan Note (Signed)
Continue rosuvastatin 20 mg daily and low-fat diet. Further recommendation will be given according to lipid panel result. 

## 2022-11-11 NOTE — Patient Instructions (Addendum)
A few things to remember from today's visit:  Routine general medical examination at a health care facility  Stage 3a chronic kidney disease (HCC), Chronic - Plan: Microalbumin / creatinine urine ratio, Basic metabolic panel, VITAMIN D 25 Hydroxy (Vit-D Deficiency, Fractures), Protein Electrophoresis,Random Urn, CANCELED: Protein Electrophoresis,Random Urn  Hyperlipidemia, unspecified hyperlipidemia type - Plan: Lipid panel  Prediabetes - Plan: Hemoglobin A1c  Iron deficiency anemia, unspecified iron deficiency anemia type - Plan: Protein Electrophoresis,Random Urn, CANCELED: Protein Electrophoresis,Random Urn  Osteopenia of lumbar spine - Plan: DG Bone Density  If you need refills for medications you take chronically, please call your pharmacy. Do not use My Chart to request refills or for acute issues that need immediate attention. If you send a my chart message, it may take a few days to be addressed, specially if I am not in the office.  Please be sure medication list is accurate. If a new problem present, please set up appointment sooner than planned today.

## 2022-11-11 NOTE — Assessment & Plan Note (Signed)
Problem has been stable, Cr 1.18 and e GFR 45 in 07/2022. Continue adequate hydration, low-salt diet, and avoidance of NSAIDs. He is stable, it is appropriate to follow annually.

## 2022-11-11 NOTE — Progress Notes (Unsigned)
HPI: [name] is a 77 y.o. female with a PMHx significant for GERD, depression, anxiety, prediabetes, HLD, OA, IBS constipation, and allergies, who is here today for her routine physical.  Last CPE: 11/09/2021  Exercise: Patient states she is not regularly exercising. Diet: She says she is cooking at home, eating mainly chicken, and trying to eat vegetables daily. She notes she is now only eating 2 meals per day. It has helped with IBS symptoms.  Sleep: She reports she sleeps 6-8 hours per night.  Alcohol Use: She says she doesn't drink alcohol. Smoking: never Vision: UTD on routine vision care.  Dental: UTD on routine dental care. She has an upcoming appointment with a periodontist.  She states she hasn't fallen in the last year.   She reports she goes to church twice per week.   Immunization History  Administered Date(s) Administered   Fluad Quad(high Dose 65+) 11/09/2018, 11/09/2019, 11/08/2020, 11/09/2021   Fluad Trivalent(High Dose 65+) 11/11/2022   Influenza, High Dose Seasonal PF 11/06/2015, 10/24/2016, 12/03/2017   PFIZER(Purple Top)SARS-COV-2 Vaccination 02/26/2019, 03/19/2019, 12/04/2019   Pneumococcal Conjugate-13 09/04/2013   Pneumococcal Polysaccharide-23 08/04/2012, 05/10/2019   RSV,unspecified 04/05/2022   Zoster Recombinant(Shingrix) 04/04/2017, 06/06/2017, 07/05/2017, 08/13/2017   Health Maintenance  Topic Date Due   Medicare Annual Wellness (AWV)  11/28/2022   COVID-19 Vaccine (4 - 2023-24 season) 11/27/2022 (Originally 10/06/2022)   DTaP/Tdap/Td (1 - Tdap) 11/11/2023 (Originally 08/20/1964)   Colonoscopy  09/03/2025   Pneumonia Vaccine 56+ Years old  Completed   INFLUENZA VACCINE  Completed   DEXA SCAN  Completed   Hepatitis C Screening  Completed   Zoster Vaccines- Shingrix  Completed   HPV VACCINES  Aged Out   Chronic medical problems:   Hyperlipidemia: Currently on rosuvastatin 20 mg daily.  Side effects from medication: none Lab  Results  Component Value Date   CHOL 190 02/14/2022   HDL 77.80 02/14/2022   LDLCALC 97 02/14/2022   TRIG 76.0 02/14/2022   CHOLHDL 2 02/14/2022   CKD III: Negative for gross hematuria, foamy urine, or decreased urine output.  Lab Results  Component Value Date   NA 139 08/01/2022   CL 104 08/01/2022   K 4.0 08/01/2022   CO2 28 08/01/2022   BUN 14 08/01/2022   CREATININE 1.18 08/01/2022   GFR 45.36 (L) 08/01/2022   CALCIUM 9.7 08/01/2022   ALBUMIN 4.0 02/14/2022   GLUCOSE 115 (H) 08/01/2022   Iron def anemia: She denies any blood in her stools or melena. She has had upper GI bleed in the past. She is taking iron supplements with vitamin C every other day.   Osteopenia: She is taking vitamin D and calcium supplements.  She is taking Nexium 40 mg daily for GERD. She follows with GI.  Depression and anxiety:She states she is doing better so she is now seeing psychiatry every 6 months.   Review of Systems  Constitutional:  Negative for activity change, appetite change and fever.  HENT:  Negative for mouth sores, sore throat and trouble swallowing.   Eyes:  Negative for redness and visual disturbance.  Respiratory:  Negative for cough, shortness of breath and wheezing.   Cardiovascular:  Negative for chest pain and leg swelling.  Gastrointestinal:  Negative for abdominal pain, nausea and vomiting.  Endocrine: Negative for cold intolerance, heat intolerance, polydipsia, polyphagia and polyuria.  Genitourinary:  Negative for decreased urine volume, dysuria and hematuria.  Musculoskeletal:  Negative for gait problem and myalgias.  Skin:  Negative  for color change and rash.  Allergic/Immunologic: Positive for environmental allergies.  Neurological:  Negative for syncope, weakness and headaches.  Hematological:  Negative for adenopathy. Does not bruise/bleed easily.  Psychiatric/Behavioral:  Negative for confusion and hallucinations.   All other systems reviewed and are  negative.  Current Outpatient Medications on File Prior to Visit  Medication Sig Dispense Refill   carbamide peroxide (DEBROX) 6.5 % OTIC solution Place 5 drops into the left ear 2 (two) times daily. 15 mL 0   cetirizine (ZYRTEC) 10 MG tablet Take 10 mg by mouth daily.     esomeprazole (NEXIUM) 40 MG capsule Take 1 capsule by mouth once daily 90 capsule 3   FLUoxetine (PROZAC) 20 MG capsule Take 1 capsule (20 mg total) by mouth daily. 30 capsule 7   linaclotide (LINZESS) 290 MCG CAPS capsule Take 290 mcg by mouth daily before breakfast.     montelukast (SINGULAIR) 10 MG tablet TAKE 1 TABLET BY MOUTH AT BEDTIME 90 tablet 2   polyethylene glycol (MIRALAX) packet Take 17 g by mouth daily. 14 each 0   risperiDONE (RISPERDAL) 0.5 MG tablet 2 qhs 60 tablet 7   rosuvastatin (CRESTOR) 20 MG tablet Take 1 tablet by mouth once daily 90 tablet 2   TURMERIC PO Take by mouth.     No current facility-administered medications on file prior to visit.   Past Medical History:  Diagnosis Date   Anxiety    Frequent headaches    GERD (gastroesophageal reflux disease)    Hyperlipidemia    IBS (irritable bowel syndrome)    Pituitary insufficiency (HCC)    Thyroid disease    Past Surgical History:  Procedure Laterality Date   ABDOMINAL HYSTERECTOMY  1990   ANAL RECTAL MANOMETRY N/A 08/14/2018   Procedure: ANO RECTAL MANOMETRY;  Surgeon: Napoleon Form, MD;  Location: [facility];  Service: Endoscopy;  Laterality: N/A;   BRAIN SURGERY     BREAST BIOPSY Left 08/23/2022   MM LT BREAST BX W LOC DEV 1ST LESION IMAGE BX SPEC STEREO GUIDE 08/23/2022 [facility]   BREAST EXCISIONAL BIOPSY Right unsure   BREAST EXCISIONAL BIOPSY Left unsure   eyelid surgery Bilateral 08/27/2021   OVARIAN CYST SURGERY  1994   REPAIR RECTOCELE  2016   and prolapsed uterus.   TONSILLECTOMY  1973   Allergies  Allergen Reactions   Other Other (See Comments)    Seasonal allergies   Gramineae Pollens Other (See  Comments)    Sneezing, runny nose, watery eyes   Family History  Problem Relation Age of Onset   Heart disease Mother        No details    Alcohol abuse Mother    Drug abuse Mother    Sickle cell anemia Mother    Heart disease Father        No details . Questionable bypass or stent   Drug abuse Father    Alcohol abuse Father    Colon cancer Maternal Aunt    Esophageal cancer Neg Hx    Pancreatic cancer Neg Hx    Stomach cancer Neg Hx     Social History   Socioeconomic History   Marital status: Widowed    Spouse name: Not on file   Number of children: 2   Years of education: Not on file   Highest education level: Not on file  Occupational History   Occupation: retired  Tobacco Use   Smoking status: Never   Smokeless tobacco: Never  Vaping Use   Vaping status: Never Used  Substance and Sexual Activity   Alcohol use: No   Drug use: No   Sexual activity: Never  Other Topics Concern   Not on file  Social History Narrative   Lives alone.  Lives near son.  Was in [place].   Social Determinants of Health   Financial Resource Strain: Low Risk  (11/27/2021)   Overall Financial Resource Strain (CARDIA)    Difficulty of Paying Living Expenses: Not hard at all  Food Insecurity: No Food Insecurity (11/27/2021)   Hunger Vital Sign    Worried About Running Out of Food in the Last Year: Never true    Ran Out of Food in the Last Year: Never true  Transportation Needs: No Transportation Needs (11/27/2021)   PRAPARE - Administrator, Civil Service (Medical): No    Lack of Transportation (Non-Medical): No  Physical Activity: Inactive (11/27/2021)   Exercise Vital Sign    Days of Exercise per Week: 0 days    Minutes of Exercise per Session: 0 min  Stress: No Stress Concern Present (11/27/2021)   [employer] of Occupational Health - Occupational Stress Questionnaire    Feeling of Stress : Not at all  Social Connections: Moderately Isolated (11/14/2020)   Social  Connection and Isolation Panel [NHANES]    Frequency of Communication with Friends and Family: More than three times a week    Frequency of Social Gatherings with Friends and Family: Once a week    Attends Religious Services: 1 to 4 times per year    Active Member of [employer] or Organizations: No    Attends Banker Meetings: Never    Marital Status: Widowed   Vitals:   11/11/22 0916  BP: 120/72  Pulse: 67  Resp: 16  Temp: 98.1 F (36.7 C)  SpO2: 97%   Body mass index is 25.51 kg/m.  Wt Readings from Last 3 Encounters:  11/11/22 144 lb (65.3 kg)  09/04/22 145 lb (65.8 kg)  08/02/22 145 lb (65.8 kg)   Physical Exam Vitals and nursing note reviewed.  Constitutional:      General: She is not in acute distress.    Appearance: She is well-developed.  HENT:     Head: Normocephalic and atraumatic.     Right Ear: Tympanic membrane, ear canal and external ear normal.     Left Ear: External ear normal.     Ears:     Comments: Left ear canal with excess cerumen, could not see TM.    Mouth/Throat:     Mouth: Mucous membranes are dry.     Pharynx: Oropharynx is clear. Uvula midline.  Eyes:     Conjunctiva/sclera: Conjunctivae normal.     Pupils: Pupils are equal, round, and reactive to light.  Neck:     Thyroid: No thyroid mass.  Cardiovascular:     Rate and Rhythm: Normal rate and regular rhythm.     Pulses:          Dorsalis pedis pulses are 2+ on the right side and 2+ on the left side.     Heart sounds: No murmur heard. Pulmonary:     Effort: Pulmonary effort is normal. No respiratory distress.     Breath sounds: Normal breath sounds.  Abdominal:     Palpations: Abdomen is soft. There is no hepatomegaly or mass.     Tenderness: There is no abdominal tenderness.  Genitourinary:    Comments: No concerns. Musculoskeletal:  Right lower leg: No edema.     Left lower leg: No edema.     Comments: No major deformity or signs of synovitis appreciated.   Lymphadenopathy:     Cervical: No cervical adenopathy.  Skin:    General: Skin is warm.     Findings: No erythema or rash.  Neurological:     General: No focal deficit present.     Mental Status: She is alert and oriented to person, place, and time.     Cranial Nerves: No cranial nerve deficit.     Coordination: Coordination normal.     Gait: Gait normal.     Deep Tendon Reflexes:     Reflex Scores:      Bicep reflexes are 2+ on the right side and 2+ on the left side.      Patellar reflexes are 2+ on the right side and 2+ on the left side. Psychiatric:        Mood and Affect: Mood and affect normal.   ASSESSMENT AND PLAN:  Ms. Nancy Herman was here today for her annual physical examination.  Orders Placed This Encounter  Procedures   DG Bone Density   Flu Vaccine Trivalent High Dose (Fluad)   Microalbumin / creatinine urine ratio   Basic metabolic panel   VITAMIN D 25 Hydroxy (Vit-D Deficiency, Fractures)   Hemoglobin A1c   Lipid panel   Protein Electrophoresis,Random Urn   Lab Results  Component Value Date   NA 144 11/11/2022   CL 107 11/11/2022   K 4.1 11/11/2022   CO2 27 11/11/2022   BUN 10 11/11/2022   CREATININE 1.06 11/11/2022   GFR 51.49 (L) 11/11/2022   CALCIUM 10.0 11/11/2022   ALBUMIN 4.0 02/14/2022   GLUCOSE 94 11/11/2022   Lab Results  Component Value Date   MICROALBUR <0.7 11/11/2022   Lab Results  Component Value Date   HGBA1C 6.0 11/11/2022   Lab Results  Component Value Date   CHOL 186 11/11/2022   HDL 87.60 11/11/2022   LDLCALC 87 11/11/2022   TRIG 58.0 11/11/2022   CHOLHDL 2 11/11/2022   Routine general medical examination at a health care facility Assessment & Plan: We discussed the importance of regular physical activity and healthy diet for prevention of chronic illness and/or complications. Preventive guidelines reviewed. Vaccination up to date. Ca++ and vit D supplementation to continue. Next CPE in a year.   Stage  3a chronic kidney disease (HCC) Assessment & Plan: Problem has been stable, Cr 1.18 and e GFR 45 in 07/2022. Continue adequate hydration, low-salt diet, and avoidance of NSAIDs. He is stable, it is appropriate to follow annually.  Orders: -     Microalbumin / creatinine urine ratio; Future -     Basic metabolic panel; Future -     VITAMIN D 25 Hydroxy (Vit-D Deficiency, Fractures); Future -     Protein Electrophoresis,Random Urn; Future  Hyperlipidemia, unspecified hyperlipidemia type Assessment & Plan: Continue rosuvastatin 20 mg daily and low-fat diet. Further recommendation will be given according to lipid panel result.  Orders: -     Lipid panel; Future  Prediabetes Assessment & Plan: Hemoglobin A1c 6.1 in 11/2021. Encourage consistency with a healthy lifestyle for diabetes prevention. Further recommendation will be given according to hemoglobin A1c.  Orders: -     Hemoglobin A1c; Future  Iron deficiency anemia, unspecified iron deficiency anemia type Assessment & Plan: Hx of GI bleeding in 07/2022. Continue iron supplementation every other day with vitamin  C. Further recommendation will be given according to lab results.  Orders: -     Protein Electrophoresis,Random Urn; Future  Osteopenia of neck of right femur Assessment & Plan: Continue adequate calcium and vitamin D supplementation as well as fall prevention. Recommend weightbearing exercise in a few times per week. DEXA will be arranged.  Orders: -     DG Bone Density; Future  Need for influenza vaccination -     Flu Vaccine Trivalent High Dose (Fluad)  Atherosclerosis of aorta (HCC) Assessment & Plan: We discussed Dx. Currently on Rosuvastatin 20 mg daily.   Gastroesophageal reflux disease without esophagitis Assessment & Plan: Problem is stable. Continue Nexium 40 mg daily and GERD precautions. She follows with GI as needed.   Return in 1 year (on 11/11/2023) for CPE, chronic problems.  I,  Rolla Etienne Wierda, acting as a scribe for Carrina Schoenberger [place], MD., have documented all relevant documentation on the behalf of Nancy Herman [place], MD, as directed by  Nancy Vater [place], MD while in the presence of Nancy Freas [place], MD.   I, Shem Plemmons [place], MD, have reviewed all documentation for this visit. The documentation on 11/11/22 for the exam, diagnosis, procedures, and orders are all accurate and complete.  Nancy Cornelio G. [place], MD  [facility]. [place] office.

## 2022-11-11 NOTE — Assessment & Plan Note (Signed)
We discussed Dx. Currently on Rosuvastatin 20 mg daily.

## 2022-11-12 LAB — MICROALBUMIN / CREATININE URINE RATIO
Creatinine,U: 112.8 mg/dL
Microalb Creat Ratio: 0.6 mg/g (ref 0.0–30.0)
Microalb, Ur: <0.7 mg/dL (ref 0.0–1.9)

## 2022-11-12 MED ORDER — ROSUVASTATIN CALCIUM 20 MG PO TABS: 20.0000 mg | ORAL_TABLET | Freq: Every day | ORAL | 3 refills | Status: DC

## 2022-11-13 ENCOUNTER — Ambulatory Visit: Payer: Medicare HMO | Admitting: Internal Medicine

## 2022-11-13 ENCOUNTER — Encounter: Payer: Self-pay | Admitting: Internal Medicine

## 2022-11-13 VITALS — BP 110/70 | HR 65 | Ht 63.0 in | Wt 143.8 lb

## 2022-11-13 DIAGNOSIS — E893 Postprocedural hypopituitarism: Secondary | ICD-10-CM | POA: Insufficient documentation

## 2022-11-13 LAB — BASIC METABOLIC PANEL
BUN: 12 mg/dL (ref 6–23)
CO2: 29 meq/L (ref 19–32)
Calcium: 9.5 mg/dL (ref 8.4–10.5)
Chloride: 106 meq/L (ref 96–112)
Creatinine, Ser: 1.03 mg/dL (ref 0.40–1.20)
GFR: 53.29 mL/min — ABNORMAL LOW (ref >60.00)
Glucose, Bld: 91 mg/dL (ref 70–99)
Potassium: 3.7 meq/L (ref 3.5–5.1)
Sodium: 143 meq/L (ref 135–145)

## 2022-11-13 LAB — TSH: TSH: 2.09 u[IU]/mL (ref 0.35–5.50)

## 2022-11-13 LAB — CORTISOL: Cortisol, Plasma: 12.6 ug/dL

## 2022-11-13 LAB — T4, FREE: Free T4: 0.9 ng/dL (ref 0.60–1.60)

## 2022-11-13 NOTE — Progress Notes (Unsigned)
Name: Nancy Herman  MRN/ DOB: [id number], [birth year]    Age/ Sex: 77 y.o., female    PCP: [place], Betty G, MD   Reason for Endocrinology Evaluation: Pituitary adenoma      Date of Initial Endocrinology Evaluation: 08/20/2016    HPI: Ms. Nancy Herman is a 77 y.o. female with a past medical history of IBS, Hx of pituitary adenoma, dyslipidemia, anxiety d/o . The patient presented for initial endocrinology clinic visit on 08/20/2016 for consultative assistance with her Pituitary adenoma .   The patient has a history of transsphenoidal surgery in 2017 , while living in [place].  This was nonsecretory with a dimension of 1.8 cm in diameter.  She did not receive any XRT. She was on LT-4 replacement following her surgery until early 2020.  She was seen by Dr. Everardo All from 2018 until 02/2020  She had normal pituitary hormones in 2022 as below FSH/LH: normal (not menopausal).   Prol: normal ACTH: stim test normal GH: IGF-1 was normal  TSH: euthyroid off synthroid (since 2020)  SUBJECTIVE:    Today (11/13/22):  Ms. Nancy Herman is here for a follow up on Hx of pituitary adenoma , S/P transphenoidal resection in 2017.    Weight has been overall stable  Denies recent headaches nor visual changes  Has rare palpitations  Denies tremors  Has chronic constipation, takes laxatives  Sleep well at night  Energy stable  No galactorrhea  Nocturia x1-2 Denies polydipsia    HISTORY:  Past Medical History:  Past Medical History:  Diagnosis Date   Anxiety    Frequent headaches    GERD (gastroesophageal reflux disease)    Hyperlipidemia    IBS (irritable bowel syndrome)    Pituitary insufficiency (HCC)    Thyroid disease    Past Surgical History:  Past Surgical History:  Procedure Laterality Date   ABDOMINAL HYSTERECTOMY  1990   ANAL RECTAL MANOMETRY N/A 08/14/2018   Procedure: ANO RECTAL MANOMETRY;  Surgeon: Napoleon Form, MD;  Location: [facility];  Service:  Endoscopy;  Laterality: N/A;   BRAIN SURGERY     BREAST BIOPSY Left 08/23/2022   MM LT BREAST BX W LOC DEV 1ST LESION IMAGE BX SPEC STEREO GUIDE 08/23/2022 [facility]   BREAST EXCISIONAL BIOPSY Right unsure   BREAST EXCISIONAL BIOPSY Left unsure   eyelid surgery Bilateral 08/27/2021   OVARIAN CYST SURGERY  1994   REPAIR RECTOCELE  2016   and prolapsed uterus.   TONSILLECTOMY  1973    Social History:  reports that she has never smoked. She has never used smokeless tobacco. She reports that she does not drink alcohol and does not use drugs. Family History: family history includes Alcohol abuse in her father and mother; Colon cancer in her maternal aunt; Drug abuse in her father and mother; Heart disease in her father and mother; Sickle cell anemia in her mother.   HOME MEDICATIONS: Allergies as of 11/13/2022       Reactions   Other Other (See Comments)   Seasonal allergies   Gramineae Pollens Other (See Comments)   Sneezing, runny nose, watery eyes        Medication List        Accurate as of November 13, 2022  9:34 AM. If you have any questions, ask your nurse or doctor.          cetirizine 10 MG tablet Commonly known as: ZYRTEC Take 10 mg by mouth daily.   Debrox  6.5 % OTIC solution Generic drug: carbamide peroxide Place 5 drops into the left ear 2 (two) times daily.   esomeprazole 40 MG capsule Commonly known as: NEXIUM Take 1 capsule by mouth once daily   FLUoxetine 20 MG capsule Commonly known as: PROzac Take 1 capsule (20 mg total) by mouth daily.   Linzess 290 MCG Caps capsule Generic drug: linaclotide Take 290 mcg by mouth daily before breakfast.   montelukast 10 MG tablet Commonly known as: SINGULAIR TAKE 1 TABLET BY MOUTH AT BEDTIME   polyethylene glycol 17 g packet Commonly known as: MiraLax Take 17 g by mouth daily.   risperiDONE 0.5 MG tablet Commonly known as: RISPERDAL 2 qhs   rosuvastatin 20 MG tablet Commonly known as:  CRESTOR Take 1 tablet (20 mg total) by mouth daily.   TURMERIC PO Take by mouth.          REVIEW OF SYSTEMS: A comprehensive ROS was conducted with the patient and is negative except as per HPI     OBJECTIVE:  VS: BP 110/70 (BP Location: Left Arm, Patient Position: Sitting, Cuff Size: Small)   Pulse 65   Ht 5\' 3"  (1.6 m)   Wt 143 lb 12.8 oz (65.2 kg)   LMP  (LMP Unknown)   SpO2 99%   BMI 25.47 kg/m    Wt Readings from Last 3 Encounters:  11/13/22 143 lb 12.8 oz (65.2 kg)  11/11/22 144 lb (65.3 kg)  09/04/22 145 lb (65.8 kg)     EXAM: General: Pt appears well and is in NAD  Neck: General: Supple without adenopathy. Thyroid: Thyroid size normal.  No goiter or nodules appreciated.   Lungs: Clear with good BS bilat   Heart: Auscultation: RRR.  Abdomen: Soft, nontender  Extremities:  BL LE: No pretibial edema   Mental Status: Judgment, insight: Intact Orientation: Oriented to time, place, and person Mood and affect: No depression, anxiety, or agitation     DATA REVIEWED: ***   MRI Brain 03/18/2020  Brain: Diffusion imaging does not show any acute or subacute infarction. Chronic small-vessel ischemic changes affect the pons. No focal cerebellar insult. Cerebral hemispheres show moderate chronic small-vessel ischemic changes of the white matter, similar to the study of 2 years ago. No cortical or large vessel territory infarction. No intra-axial mass lesion, hemorrhage, hydrocephalus or extra-axial collection. Previous trans-sphenoidal resection of a pituitary mass. Residual pituitary tissue has an unremarkable appearance. No evidence recurrent pituitary tumor.   Vascular: Major vessels at the base of the brain show flow.   Skull and upper cervical spine: Negative   Sinuses/Orbits: Clear/normal   Other: None   IMPRESSION: 1. No change since 2 years ago. Moderate chronic small-vessel ischemic changes of the pons and cerebral hemispheric white  matter. No acute or subacute finding. 2. Previous trans-sphenoidal resection of a pituitary mass. Residual pituitary tissue has an unremarkable appearance.     Old records , labs and images have been reviewed.    ASSESSMENT/PLAN/RECOMMENDATIONS:   S/P Transphenoidal hypophysectomy :    -She is s/p transsphenoidal pituitary surgery in 2017 -Last MRI in 2022 shows stability of 2 years -No local symptoms at this time -Historically she has had normal pituitary hormones including normal cosyntropin stimulation test -No clinical evidence of hypo or hypopituitarism -Will proceed with checkup of pituitary hormones***    Follow-up in 1 year  I spent 30 minutes preparing to see the patient by review of recent labs, imaging and procedures, obtaining and reviewing separately obtained  history, communicating with the patient, ordering medications, tests or procedures, and documenting clinical information in the EHR including the differential Dx, treatment, and any further evaluation and other management     Signed electronically by: Lyndle Herrlich, MD  [facility] Endocrinology  [facility] [address] [place] [address] [place], [place] [address] Phone: [phone] FAX: [phone]   CC: [place], Betty G, MD [address] [place] [address] Phone: [phone] Fax: [phone]   Return to Endocrinology clinic as below: Future Appointments  Date Time Provider Department Center  12/02/2022 10:30 AM [facility] ADVIS[facility] 2 [facility]  04/16/2023  1:30 PM Archer Asa, MD [facility] None  11/11/2023  9:30 AM [place], Betty G, MD [facility]

## 2022-11-16 LAB — ACTH: C206 ACTH: 16 pg/mL (ref 6–50)

## 2022-11-16 LAB — PROLACTIN: Prolactin: 15.8 ng/mL

## 2022-11-25 ENCOUNTER — Ambulatory Visit (INDEPENDENT_AMBULATORY_CARE_PROVIDER_SITE_OTHER)
Admission: RE | Admit: 2022-11-25 | Discharge: 2022-11-25 | Disposition: A | Discharge status: Home / Self Care | Payer: Medicare HMO | Source: Ambulatory Visit | Attending: Family Medicine | Admitting: Family Medicine

## 2022-11-25 DIAGNOSIS — M85851 Other specified disorders of bone density and structure, right thigh: Secondary | ICD-10-CM

## 2022-11-27 ENCOUNTER — Telehealth: Payer: Self-pay | Admitting: Family Medicine

## 2022-11-27 NOTE — Telephone Encounter (Signed)
Pt called in and wanted to know if she needs to get her next Covid vaccine. She would like a call back to discuss further.

## 2022-12-02 ENCOUNTER — Ambulatory Visit: Payer: Medicare HMO

## 2022-12-02 VITALS — Ht 63.0 in | Wt 143.0 lb

## 2022-12-02 DIAGNOSIS — Z0000 Encounter for general adult medical examination without abnormal findings: Secondary | ICD-10-CM

## 2022-12-02 NOTE — Progress Notes (Signed)
Subjective:   Nancy Herman is a 78 y.o. female who presents for Medicare Annual (Subsequent) preventive examination.  Visit Complete: Virtual I connected with  Nancy Herman on 12/02/22 by a audio enabled telemedicine application and verified that I am speaking with the correct person using two identifiers.  Patient Location: Home  Provider Location: Home Office  I discussed the limitations of evaluation and management by telemedicine. The patient expressed understanding and agreed to proceed.  Vital Signs: Because this visit was a virtual/telehealth visit, some criteria may be missing or patient reported. Any vitals not documented were not able to be obtained and vitals that have been documented are patient reported.    Cardiac Risk Factors include: advanced age (>12men, >45 women);dyslipidemia     Objective:    Today's Vitals   12/02/22 0930  Weight: 143 lb (64.9 kg)  Height: 5\' 3"  (1.6 m)   Body mass index is 25.33 kg/m.     12/02/2022    9:36 AM 11/27/2021   10:16 AM 11/14/2020   10:27 AM 11/10/2019   11:18 AM 09/29/2018   12:28 PM 03/17/2018    8:15 AM 03/17/2018   12:42 AM  Advanced Directives  Does Patient Have a Medical Advance Directive? Yes Yes Yes Yes Yes No No  Type of Estate agent of [place];Living will Healthcare Power of Attorney Living will Healthcare Power of Attorney Living will    Does patient want to make changes to medical advance directive?     No - Patient declined    Copy of Healthcare Power of Attorney in Chart? No - copy requested No - copy requested  No - copy requested     Would patient like information on creating a medical advance directive?      No - Patient declined     Current Medications (verified) Outpatient Encounter Medications as of 12/02/2022  Medication Sig   carbamide peroxide (DEBROX) 6.5 % OTIC solution Place 5 drops into the left ear 2 (two) times daily.   cetirizine (ZYRTEC) 10 MG tablet  Take 10 mg by mouth daily.   esomeprazole (NEXIUM) 40 MG capsule Take 1 capsule by mouth once daily   FLUoxetine (PROZAC) 20 MG capsule Take 1 capsule (20 mg total) by mouth daily.   linaclotide (LINZESS) 290 MCG CAPS capsule Take 290 mcg by mouth daily before breakfast.   montelukast (SINGULAIR) 10 MG tablet TAKE 1 TABLET BY MOUTH AT BEDTIME   polyethylene glycol (MIRALAX) packet Take 17 g by mouth daily.   risperiDONE (RISPERDAL) 0.5 MG tablet 2 qhs   rosuvastatin (CRESTOR) 20 MG tablet Take 1 tablet (20 mg total) by mouth daily.   TURMERIC PO Take by mouth.   No facility-administered encounter medications on file as of 12/02/2022.    Allergies (verified) Other and Gramineae pollens   History: Past Medical History:  Diagnosis Date   Anxiety    Frequent headaches    GERD (gastroesophageal reflux disease)    Hyperlipidemia    IBS (irritable bowel syndrome)    Pituitary insufficiency (HCC)    Thyroid disease    Past Surgical History:  Procedure Laterality Date   ABDOMINAL HYSTERECTOMY  1990   ANAL RECTAL MANOMETRY N/A 08/14/2018   Procedure: ANO RECTAL MANOMETRY;  Surgeon: Napoleon Form, MD;  Location: [facility];  Service: Endoscopy;  Laterality: N/A;   BRAIN SURGERY     BREAST BIOPSY Left 08/23/2022   MM LT BREAST BX W LOC DEV 1ST LESION  IMAGE BX SPEC STEREO GUIDE 08/23/2022 [facility]   BREAST EXCISIONAL BIOPSY Right unsure   BREAST EXCISIONAL BIOPSY Left unsure   eyelid surgery Bilateral 08/27/2021   OVARIAN CYST SURGERY  1994   REPAIR RECTOCELE  2016   and prolapsed uterus.   TONSILLECTOMY  1973   Family History  Problem Relation Age of Onset   Heart disease Mother        No details    Alcohol abuse Mother    Drug abuse Mother    Sickle cell anemia Mother    Heart disease Father        No details . Questionable bypass or stent   Drug abuse Father    Alcohol abuse Father    Colon cancer Maternal Aunt    Esophageal cancer Neg Hx    Pancreatic  cancer Neg Hx    Stomach cancer Neg Hx    Social History   Socioeconomic History   Marital status: Widowed    Spouse name: Not on file   Number of children: 2   Years of education: Not on file   Highest education level: Not on file  Occupational History   Occupation: retired  Tobacco Use   Smoking status: Never   Smokeless tobacco: Never  Vaping Use   Vaping status: Never Used  Substance and Sexual Activity   Alcohol use: No   Drug use: No   Sexual activity: Never  Other Topics Concern   Not on file  Social History Narrative   Lives alone.  Lives near son.  Was in [place].   Social Determinants of Health   Financial Resource Strain: Low Risk  (12/02/2022)   Overall Financial Resource Strain (CARDIA)    Difficulty of Paying Living Expenses: Not hard at all  Food Insecurity: No Food Insecurity (12/02/2022)   Hunger Vital Sign    Worried About Running Out of Food in the Last Year: Never true    Ran Out of Food in the Last Year: Never true  Transportation Needs: No Transportation Needs (12/02/2022)   PRAPARE - Administrator, Civil Service (Medical): No    Lack of Transportation (Non-Medical): No  Physical Activity: Inactive (12/02/2022)   Exercise Vital Sign    Days of Exercise per Week: 0 days    Minutes of Exercise per Session: 0 min  Stress: No Stress Concern Present (12/02/2022)   [employer] of Occupational Health - Occupational Stress Questionnaire    Feeling of Stress : Not at all  Social Connections: Moderately Isolated (11/14/2020)   Social Connection and Isolation Panel [NHANES]    Frequency of Communication with Friends and Family: More than three times a week    Frequency of Social Gatherings with Friends and Family: Once a week    Attends Religious Services: 1 to 4 times per year    Active Member of [employer] or Organizations: No    Attends Banker Meetings: Never    Marital Status: Widowed    Tobacco Counseling Counseling  given: Not Answered   Clinical Intake:  Pre-visit preparation completed: Yes  Pain : No/denies pain     BMI - recorded: 25.33 Nutritional Status: BMI 25 -29 Overweight Nutritional Risks: None Diabetes: No  How often do you need to have someone help you when you read instructions, pamphlets, or other written materials from your doctor or pharmacy?: 1 - Never  Interpreter Needed?: No  Information entered by :: Theresa Mulligan LPN   Activities of  Daily Living    12/02/2022    9:35 AM  In your present state of health, do you have any difficulty performing the following activities:  Hearing? 0  Vision? 0  Difficulty concentrating or making decisions? 0  Walking or climbing stairs? 0  Dressing or bathing? 0  Doing errands, shopping? 0  Preparing Food and eating ? N  Using the Toilet? N  In the past six months, have you accidently leaked urine? N  Do you have problems with loss of bowel control? N  Managing your Medications? N  Managing your Finances? N  Housekeeping or managing your Housekeeping? N    Patient Care Team: [place], Betty G, MD as PCP - General (Family Medicine) Rollene Rotunda, MD as PCP - Cardiology (Cardiology)  Indicate any recent Medical Services you may have received from other than [facility] providers in the past year (date may be approximate).     Assessment:   This is a routine wellness examination for [place].  Hearing/Vision screen Hearing Screening - Comments:: Denies hearing difficulties   Vision Screening - Comments:: Wears rx glasses - up to date with routine eye exams with  Dr Nile Riggs   Goals Addressed               This Visit's Progress     Increase physical activity (pt-stated)         Depression Screen    12/02/2022    9:35 AM 11/11/2022    9:21 AM 07/29/2022   12:45 PM 11/27/2021   10:17 AM 11/09/2021    9:35 AM 08/28/2021    3:29 PM 11/14/2020   10:26 AM  PHQ 2/9 Scores  PHQ - 2 Score 0 0 1 0 0 2 0  PHQ- 9 Score 0 0  5  1 10      Fall Risk    12/02/2022    9:35 AM 07/29/2022   12:45 PM 11/27/2021   10:17 AM 11/09/2021    9:35 AM 11/14/2020   10:28 AM  Fall Risk   Falls in the past year? 0 0 0 0 0  Number falls in past yr: 0 0 0 0 0  Injury with Fall? 0 0 0 0 0  Risk for fall due to : No Fall Risks Other (Comment) Medication side effect  Impaired vision;Impaired balance/gait  Follow up Falls prevention discussed Falls evaluation completed Falls prevention discussed;Education provided;Falls evaluation completed  Falls prevention discussed    MEDICARE RISK AT HOME: Medicare Risk at Home Any stairs in or around the home?: No If so, are there any without handrails?: No Home free of loose throw rugs in walkways, pet beds, electrical cords, etc?: Yes Adequate lighting in your home to reduce risk of falls?: Yes Life alert?: No Use of a cane, walker or w/c?: No Grab bars in the bathroom?: Yes Shower chair or bench in shower?: Yes Elevated toilet seat or a handicapped toilet?: Yes  TIMED UP AND GO:  Was the test performed?  No    Cognitive Function:    10/28/2017   10:08 AM  MMSE - Mini Mental State Exam  Not completed: --        12/02/2022    9:39 AM 12/02/2022    9:37 AM 11/27/2021   10:19 AM 11/14/2020   10:30 AM 11/10/2019   11:26 AM  6CIT Screen  What Year? 0 points 0 points 0 points 0 points 0 points  What month? 0 points 0 points 0 points 3 points 0  points  What time? 0 points 0 points 0 points 0 points   Count back from 20 0 points 0 points 2 points 0 points 0 points  Months in reverse 4 points 0 points 0 points 4 points 0 points  Repeat phrase 0 points 0 points 0 points 2 points 0 points  Total Score 4 points 0 points 2 points 9 points     Immunizations Immunization History  Administered Date(s) Administered   Fluad Quad(high Dose 65+) 11/09/2018, 11/09/2019, 11/08/2020, 11/09/2021   Fluad Trivalent(High Dose 65+) 11/11/2022   Influenza, High Dose Seasonal PF 11/06/2015,  10/24/2016, 12/03/2017   PFIZER(Purple Top)SARS-COV-2 Vaccination 02/26/2019, 03/19/2019, 12/04/2019   Pneumococcal Conjugate-13 09/04/2013   Pneumococcal Polysaccharide-23 08/04/2012, 05/10/2019   RSV,unspecified 04/05/2022   Zoster Recombinant(Shingrix) 04/04/2017, 06/06/2017, 07/05/2017, 08/13/2017    TDAP status: Due, Education has been provided regarding the importance of this vaccine. Advised may receive this vaccine at local pharmacy or Health Dept. Aware to provide a copy of the vaccination record if obtained from local pharmacy or Health Dept. Verbalized acceptance and understanding.  Flu Vaccine status: Up to date  Pneumococcal vaccine status: Up to date  Covid-19 vaccine status: Declined, Education has been provided regarding the importance of this vaccine but patient still declined. Advised may receive this vaccine at local pharmacy or Health Dept.or vaccine clinic. Aware to provide a copy of the vaccination record if obtained from local pharmacy or Health Dept. Verbalized acceptance and understanding.  Qualifies for Shingles Vaccine? Yes   Zostavax completed Yes   Shingrix Completed?: Yes  Screening Tests Health Maintenance  Topic Date Due   COVID-19 Vaccine (4 - 2023-24 season) 10/06/2022   DTaP/Tdap/Td (1 - Tdap) 11/11/2023 (Originally 08/20/1964)   Medicare Annual Wellness (AWV)  12/02/2023   Colonoscopy  09/03/2025   Pneumonia Vaccine 85+ Years old  Completed   INFLUENZA VACCINE  Completed   DEXA SCAN  Completed   Hepatitis C Screening  Completed   Zoster Vaccines- Shingrix  Completed   HPV VACCINES  Aged Out    Health Maintenance  Health Maintenance Due  Topic Date Due   COVID-19 Vaccine (4 - 2023-24 season) 10/06/2022    Colorectal cancer screening: Type of screening: Colonoscopy. Completed 09/04/22. Repeat every 3 years    Bone Density status: Completed 11/25/22. Results reflect: Bone density results: OSTEOPENIA. Repeat every    years.     Additional Screening:  Hepatitis C Screening: does qualify; Completed 12/08/17  Vision Screening: Recommended annual ophthalmology exams for early detection of glaucoma and other disorders of the eye. Is the patient up to date with their annual eye exam?  Yes  Who is the provider or what is the name of the office in which the patient attends annual eye exams? Dr Nile Riggs If pt is not established with a provider, would they like to be referred to a provider to establish care? No .   Dental Screening: Recommended annual dental exams for proper oral hygiene    Community Resource Referral / Chronic Care Management:  CRR required this visit?  No   CCM required this visit?  No     Plan:     I have personally reviewed and noted the following in the patient's chart:   Medical and social history Use of alcohol, tobacco or illicit drugs  Current medications and supplements including opioid prescriptions. Patient is not currently taking opioid prescriptions. Functional ability and status Nutritional status Physical activity Advanced directives List of other physicians Hospitalizations, surgeries, and  ER visits in previous 12 months Vitals Screenings to include cognitive, depression, and falls Referrals and appointments  In addition, I have reviewed and discussed with patient certain preventive protocols, quality metrics, and best practice recommendations. A written personalized care plan for preventive services as well as general preventive health recommendations were provided to patient.     Tillie Rung, LPN   [id number]   After Visit Summary: (MyChart) Due to this being a telephonic visit, the after visit summary with patients personalized plan was offered to patient via MyChart   Nurse Notes: None

## 2022-12-02 NOTE — Telephone Encounter (Signed)
There is a new COVID 19 vaccine since 10/2022. If she has had her other COVID 19 vaccines and has not developed reactions/side effects, she can go a head and get the latest one. Thanks, BJ

## 2022-12-02 NOTE — Patient Instructions (Addendum)
Nancy Herman , Thank you for taking time to come for your Medicare Wellness Visit. I appreciate your ongoing commitment to your health goals. Please review the following plan we discussed and let me know if I can assist you in the future.   Referrals/Orders/Follow-Ups/Clinician Recommendations:   This is a list of the screening recommended for you and due dates:  Health Maintenance  Topic Date Due   COVID-19 Vaccine (4 - 2023-24 season) 10/06/2022   DTaP/Tdap/Td vaccine (1 - Tdap) 11/11/2023*   Medicare Annual Wellness Visit  12/02/2023   Colon Cancer Screening  09/03/2025   Pneumonia Vaccine  Completed   Flu Shot  Completed   DEXA scan (bone density measurement)  Completed   Hepatitis C Screening  Completed   Zoster (Shingles) Vaccine  Completed   HPV Vaccine  Aged Out  *Topic was postponed. The date shown is not the original due date.    Advanced directives: (Copy Requested) Please bring a copy of your health care power of attorney and living will to the office to be added to your chart at your convenience.  Next Medicare Annual Wellness Visit scheduled for next year: Yes

## 2022-12-03 NOTE — Telephone Encounter (Signed)
I called and spoke with patient. We went over the information below and she verbalized understanding.

## 2023-01-10 ENCOUNTER — Other Ambulatory Visit: Payer: Self-pay | Admitting: Family Medicine

## 2023-01-24 ENCOUNTER — Ambulatory Visit: Payer: Medicare HMO

## 2023-01-24 ENCOUNTER — Ambulatory Visit (INDEPENDENT_AMBULATORY_CARE_PROVIDER_SITE_OTHER): Payer: Medicare HMO | Admitting: Family Medicine

## 2023-01-24 ENCOUNTER — Encounter: Payer: Self-pay | Admitting: Family Medicine

## 2023-01-24 VITALS — BP 118/70 | HR 82 | Resp 16 | Ht 63.0 in | Wt 142.0 lb

## 2023-01-24 DIAGNOSIS — R634 Abnormal weight loss: Secondary | ICD-10-CM

## 2023-01-24 DIAGNOSIS — D509 Iron deficiency anemia, unspecified: Secondary | ICD-10-CM

## 2023-01-24 DIAGNOSIS — K219 Gastro-esophageal reflux disease without esophagitis: Secondary | ICD-10-CM | POA: Diagnosis not present

## 2023-01-24 DIAGNOSIS — J439 Emphysema, unspecified: Secondary | ICD-10-CM | POA: Diagnosis not present

## 2023-01-24 DIAGNOSIS — R1310 Dysphagia, unspecified: Secondary | ICD-10-CM | POA: Diagnosis not present

## 2023-01-24 DIAGNOSIS — R059 Cough, unspecified: Secondary | ICD-10-CM

## 2023-01-24 LAB — CBC WITH DIFFERENTIAL/PLATELET
Basophils Absolute: 0.1 10*3/uL (ref 0.0–0.1)
Basophils Relative: 1 % (ref 0.0–3.0)
Eosinophils Absolute: 0.2 10*3/uL (ref 0.0–0.7)
Eosinophils Relative: 3.2 % (ref 0.0–5.0)
HCT: 34 % — ABNORMAL LOW (ref 36.0–46.0)
Hemoglobin: 11.4 g/dL — ABNORMAL LOW (ref 12.0–15.0)
Lymphocytes Relative: 30.7 % (ref 12.0–46.0)
Lymphs Abs: 1.7 10*3/uL (ref 0.7–4.0)
MCHC: 33.7 g/dL (ref 30.0–36.0)
MCV: 88.6 fL (ref 78.0–100.0)
Monocytes Absolute: 0.4 10*3/uL (ref 0.1–1.0)
Monocytes Relative: 7.4 % (ref 3.0–12.0)
Neutro Abs: 3.2 10*3/uL (ref 1.4–7.7)
Neutrophils Relative %: 57.7 % (ref 43.0–77.0)
Platelets: 179 10*3/uL (ref 150.0–400.0)
RBC: 3.83 Mil/uL — ABNORMAL LOW (ref 3.87–5.11)
RDW: 14.4 % (ref 11.5–15.5)
WBC: 5.6 10*3/uL (ref 4.0–10.5)

## 2023-01-24 LAB — BASIC METABOLIC PANEL
BUN: 13 mg/dL (ref 6–23)
CO2: 30 meq/L (ref 19–32)
Calcium: 9.4 mg/dL (ref 8.4–10.5)
Chloride: 105 meq/L (ref 96–112)
Creatinine, Ser: 1.09 mg/dL (ref 0.40–1.20)
GFR: 49.72 mL/min — ABNORMAL LOW (ref >60.00)
Glucose, Bld: 95 mg/dL (ref 70–99)
Potassium: 3.6 meq/L (ref 3.5–5.1)
Sodium: 143 meq/L (ref 135–145)

## 2023-01-24 LAB — HEPATIC FUNCTION PANEL
ALT: 16 U/L (ref 0–35)
AST: 21 U/L (ref 0–37)
Albumin: 4.2 g/dL (ref 3.5–5.2)
Alkaline Phosphatase: 52 U/L (ref 39–117)
Bilirubin, Direct: 0.1 mg/dL (ref 0.0–0.3)
Total Bilirubin: 0.3 mg/dL (ref 0.2–1.2)
Total Protein: 6.9 g/dL (ref 6.0–8.3)

## 2023-01-24 NOTE — Assessment & Plan Note (Signed)
Today she reporting 1 to 2 weeks of dysphagia, it seems like she has had this problem in the past but resolved. EGD in 08/2017 revealed a small hiatal hernia, otherwise negative. We discussed possible etiologies, most likely related to GERD. Because she is concerned about unintended weight loss, recommend arranging appointment with GI. Instructed about warning signs.

## 2023-01-24 NOTE — Progress Notes (Signed)
ACUTE VISIT Chief Complaint  Patient presents with   Weight Loss    Has only been having 2 meals a day, usually will have fruit in the evening but not a full meal   HPI: [name] is a 77 y.o. female, who is here today complaining of unwanted weight loss   Last seen on 11/11/2022.   Unwanted weight loss: Pt reports noticeable weight loss in the last 3-4 weeks, stating her clothes are fitting loser at her bottom, chest, and hips.  She has been eating 2 meals a day on average and in the evenings tends to eat fruit rather than a full meal.  Pt endorses changes to her appetite, has recently been forcing herself to eat in the evenings.  No recent changes to eating habits or activity. No associated fever, nausea, or vomiting.  She is established with endocrinology and labs were recently obtained on 11/13/22. Slight changes to urinary frequency when she was drinking less water.   Wt Readings from Last 3 Encounters:  01/24/23 142 lb (64.4 kg)  12/02/22 143 lb (64.9 kg)  11/13/22 143 lb 12.8 oz (65.2 kg)   Dysphagia: Complains of difficulty swallowing meat and pills for about 1-2 weeks. Additionally, endorses regurgitation without coughing.   Previously she did not have an issue swallowing food or her pills.  Had upper endoscopy in 08/2016.  Established with GI, follows up with GI as instructed.  She reports recently cracking 2 teeth, one on the right and one on the left.  No heartburn.   Notes bowel changes since starting iron pills.  She is taking iron supplementation every other day along with her vitamin C.   Reports intermittent right knee pain, mostly at nighttime.   She reports new cough and hoarseness starting about 2 weeks ago.   Review of Systems See other pertinent positives and negatives in HPI.  Current Outpatient Medications on File Prior to Visit  Medication Sig Dispense Refill   carbamide peroxide (DEBROX) 6.5 % OTIC solution Place 5 drops into the  left ear 2 (two) times daily. 15 mL 0   cetirizine (ZYRTEC) 10 MG tablet Take 10 mg by mouth daily.     esomeprazole (NEXIUM) 40 MG capsule Take 1 capsule by mouth once daily 90 capsule 2   FLUoxetine (PROZAC) 20 MG capsule Take 1 capsule (20 mg total) by mouth daily. 30 capsule 7   linaclotide (LINZESS) 290 MCG CAPS capsule Take 290 mcg by mouth daily before breakfast.     montelukast (SINGULAIR) 10 MG tablet TAKE 1 TABLET BY MOUTH AT BEDTIME 90 tablet 2   polyethylene glycol (MIRALAX) packet Take 17 g by mouth daily. 14 each 0   risperiDONE (RISPERDAL) 0.5 MG tablet 2 qhs 60 tablet 7   rosuvastatin (CRESTOR) 20 MG tablet Take 1 tablet (20 mg total) by mouth daily. 90 tablet 3   TURMERIC PO Take by mouth.     No current facility-administered medications on file prior to visit.   Past Medical History:  Diagnosis Date   Anxiety    Frequent headaches    GERD (gastroesophageal reflux disease)    Hyperlipidemia    IBS (irritable bowel syndrome)    Pituitary insufficiency (HCC)    Thyroid disease    Allergies  Allergen Reactions   Other Other (See Comments)    Seasonal allergies   Gramineae Pollens Other (See Comments)    Sneezing, runny nose, watery eyes   Social History   Socioeconomic History  Marital status: Widowed    Spouse name: Not on file   Number of children: 2   Years of education: Not on file   Highest education level: Not on file  Occupational History   Occupation: retired  Tobacco Use   Smoking status: Never   Smokeless tobacco: Never  Vaping Use   Vaping status: Never Used  Substance and Sexual Activity   Alcohol use: No   Drug use: No   Sexual activity: Never  Other Topics Concern   Not on file  Social History Narrative   Lives alone.  Lives near son.  Was in [place].   Social Drivers of Corporate investment banker Strain: Low Risk  (12/02/2022)   Overall Financial Resource Strain (CARDIA)    Difficulty of Paying Living Expenses: Not hard at all  Food  Insecurity: No Food Insecurity (12/02/2022)   Hunger Vital Sign    Worried About Running Out of Food in the Last Year: Never true    Ran Out of Food in the Last Year: Never true  Transportation Needs: No Transportation Needs (12/02/2022)   PRAPARE - Administrator, Civil Service (Medical): No    Lack of Transportation (Non-Medical): No  Physical Activity: Inactive (12/02/2022)   Exercise Vital Sign    Days of Exercise per Week: 0 days    Minutes of Exercise per Session: 0 min  Stress: No Stress Concern Present (12/02/2022)   [employer] of Occupational Health - Occupational Stress Questionnaire    Feeling of Stress : Not at all  Social Connections: Moderately Isolated (11/14/2020)   Social Connection and Isolation Panel [NHANES]    Frequency of Communication with Friends and Family: More than three times a week    Frequency of Social Gatherings with Friends and Family: Once a week    Attends Religious Services: 1 to 4 times per year    Active Member of [employer] or Organizations: No    Attends Banker Meetings: Never    Marital Status: Widowed   Vitals:   01/24/23 1341  BP: 118/70  Pulse: 82  Resp: 16  SpO2: 98%   Body mass index is 25.15 kg/m.  Physical Exam  ASSESSMENT AND PLAN: Nancy Herman was seen today for weight loss.  Iron deficiency anemia, unspecified iron deficiency anemia type Assessment & Plan: Currently she is on iron supplementation, ferrous sulfate 325 mg every other day with vitamin C.  Reporting some worsening constipation. Colonoscopy in 08/2022. Will check CBC and iron studies today and will make recommendations accordingly.  Orders: -     CBC with Differential/Platelet; Future -     Iron -     Ferritin  Weight loss, non-intentional -     Basic metabolic panel; Future -     Hepatic function panel; Future -     DG Chest 2 View; Future  Cough, unspecified type -     DG Chest 2 View; Future  Dysphagia,  unspecified type Assessment & Plan: Today she reporting 1 to 2 weeks of dysphagia, it seems like she has had this problem in the past but resolved. EGD in 08/2017 revealed a small hiatal hernia, otherwise negative. We discussed possible etiologies, most likely related to GERD. Because she is concerned about unintended weight loss, recommend arranging appointment with GI. Instructed about warning signs.   Gastroesophageal reflux disease without esophagitis Assessment & Plan: This problem could be contributed to her cough, reporting some acid reflux in the  morning. Continue Nexium 40 mg daily and GERD precautions. Instructed to follow-up with her gastroenterologist.    She has lost about 12 pounds since her last visit. We discussed possible causes, she reports some decreased appetite and broken teeth. We will continue monitoring. Further recommendation will be given according to lab results. *** Return in about 2 months (around 03/27/2023) for wt.   I, Isabelle Course, acting as a scribe for Yekaterina Escutia [place], MD., have documented all relevant documentation on the behalf of Nancy Talbot [place], MD, as directed by  Sander Speckman [place], MD while in the presence of Halcyon Heck [place], MD.  I, Daja Shuping [place], MD, have reviewed all documentation for this visit. The documentation on 01/24/23 for the exam, diagnosis, procedures, and orders are all accurate and complete.  ***   Stepheny Canal G. [place], MD  [facility]. [place] [facility].

## 2023-01-24 NOTE — Assessment & Plan Note (Addendum)
Currently she is on iron supplementation, ferrous sulfate 325 mg every other day with vitamin C.  Reporting some worsening constipation. Colonoscopy in 08/2022. Will check CBC and iron studies today and will make recommendations accordingly.

## 2023-01-24 NOTE — Assessment & Plan Note (Signed)
This problem could be contributed to her cough, reporting some acid reflux in the morning. Continue Nexium 40 mg daily and GERD precautions. Instructed to follow-up with her gastroenterologist.

## 2023-01-24 NOTE — Patient Instructions (Addendum)
A few things to remember from today's visit:  Iron deficiency anemia, unspecified iron deficiency anemia type - Plan: CBC with Differential/Platelet, Iron, Ferritin  Weight loss, non-intentional - Plan: Basic metabolic panel, Hepatic function panel, DG Chest 2 View  Cough, unspecified type - Plan: DG Chest 2 View  Dysphagia, unspecified type  Eat more frequent, small bites of protein, ensure 1-2 times per day to replace meals. Please arrange an appt with your gastroenterologist to discuss difficulty swallowing. No changes in medication for acid reflux.  If you need refills for medications you take chronically, please call your pharmacy. Do not use My Chart to request refills or for acute issues that need immediate attention. If you send a my chart message, it may take a few days to be addressed, specially if I am not in the office.  Please be sure medication list is accurate. If a new problem present, please set up appointment sooner than planned today.

## 2023-01-25 LAB — FERRITIN: Ferritin: 106 ng/mL (ref 15–150)

## 2023-01-25 LAB — IRON: Iron: 50 ug/dL (ref 27–139)

## 2023-02-12 NOTE — Progress Notes (Signed)
 02/13/2023 Nancy Herman [id number] 08-07-1945  Referring provider: [place], Betty G, MD Primary GI doctor: Dr. Lavon Paganini  ASSESSMENT AND PLAN:  Dysphagia with GERD not controlled with nexium once daily Potential etiologies include stricture, esophagitis ( infectious, eosinophilic, reflux), or esophageal dysmotility. Schedule EGD with dilatation to evaluate for stenosis, tumor, erosive/infectious esophagititis, and EOE.   If the EGD is negative, can consider barium swallow  I discussed risks of EGD with patient today, including risk of sedation, bleeding or perforation.  Patient provides understanding and gave verbal consent to proceed. In the interim patient advised about swallowing precautions.  Eat slowly, chew food well before swallowing.  Drink liquids in between each bite to avoid food impaction.  Iron Deficiency Anemia Patient was started on iron supplementation in June due to anemia but this was normocytic and no iron was done at that time. Recent labs show improvement in CBC and normal iron levels. Patient reports dark stools since starting iron supplementation. -Continue iron supplementation every other day. -Will progress with EGD, recent normal colonoscopy -Potentially secondary to CKD -Consider checking B12/folate sentient normocytic anemia  Constipation Patient reports hard stools since starting iron supplementation. Currently on Linzess. -Advise to add Miralax as needed for hard stools while on iron supplementation.  Continue Linzess  Diverticulosis and Hemorrhoids Asymptomatic, identified on recent colonoscopy. -Provide patient with information about diverticulosis and hemorrhoids.    Patient Care Team: [place], Betty G, MD as PCP - General (Family Medicine) Rollene Rotunda, MD as PCP - Cardiology (Cardiology)  HISTORY OF PRESENT ILLNESS: 78 y.o. female with a past medical history of hyperlipidemia, IBS, pituitary insufficiency, hypothyroidism, GERD,  headaches and anxiety, status post hysterectomy, previous anorectal manometry and others listed below presents for evaluation of dysphagia with food impaction.   09/04/2022 colonoscopy with Dr. Lavon Paganini for personal history of colon polyps.  2 polyps 3 to 4 mm sigmoid colon, diverticulosis, nonbleeding external/internal hemorrhoids no repeat colonoscopy due to age.  Discussed the use of AI scribe software for clinical note transcription with the patient, who gave verbal consent to proceed.  History of Present Illness   The patient, with a history of diverticulosis and hemorrhoids, presents with a chief complaint of dysphagia. She describes a sensation of food getting stuck in the mid-esophageal region, primarily with solid foods, which has been occurring intermittently for the past three to four weeks. The patient denies any associated pain with swallowing, but reports a persistent discomfort when food gets lodged. She is able to alleviate the sensation by drinking fluids.  The patient also reports a history of gastroesophageal reflux disease (GERD), for which she is on a daily regimen of esomeprazole. She has been compliant with the medication, taking it 30 minutes before meals. Despite this, she occasionally experiences a sensation of acid reflux.  In addition to the dysphagia and GERD, the patient has been experiencing changes in bowel habits. She has been on Linzess for constipation, which was initially effective in producing soft, loose stools. However, after starting an iron supplement, her stools have become harder. She has also noticed a change in stool color to a darker hue since starting the iron supplement.  The patient has also noticed a decrease in appetite and subsequent weight loss. She reports feeling full quickly, which is a new symptom for her. She has been managing this by consuming two meals a day, with her largest meal at lunchtime. She has also started taking Ensure to supplement  her diet.  The patient denies  any abdominal pain, but does report occasional shortness of breath, particularly when exposed to steam or cold weather. She denies any chest pain or shortness of breath with exertion.        Wt Readings from Last 8 Encounters:  02/13/23 143 lb (64.9 kg)  01/24/23 142 lb (64.4 kg)  12/02/22 143 lb (64.9 kg)  11/13/22 143 lb 12.8 oz (65.2 kg)  11/11/22 144 lb (65.3 kg)  09/04/22 145 lb (65.8 kg)  08/02/22 145 lb (65.8 kg)  07/29/22 146 lb 8 oz (66.5 kg)    She  reports that she has never smoked. She has never used smokeless tobacco. She reports that she does not drink alcohol and does not use drugs.  RELEVANT LABS AND IMAGING:  CBC    Component Value Date/Time   WBC 5.6 01/24/2023 1438   RBC 3.83 (L) 01/24/2023 1438   HGB 11.4 (L) 01/24/2023 1438   HCT 34.0 (L) 01/24/2023 1438   PLT 179.0 01/24/2023 1438   MCV 88.6 01/24/2023 1438   MCH 28.9 11/19/2019 1355   MCHC 33.7 01/24/2023 1438   RDW 14.4 01/24/2023 1438   LYMPHSABS 1.7 01/24/2023 1438   MONOABS 0.4 01/24/2023 1438   EOSABS 0.2 01/24/2023 1438   BASOSABS 0.1 01/24/2023 1438   Recent Labs    08/01/22 1008 01/24/23 1438  HGB 10.8* 11.4*    CMP     Component Value Date/Time   NA 143 01/24/2023 1438   K 3.6 01/24/2023 1438   CL 105 01/24/2023 1438   CO2 30 01/24/2023 1438   GLUCOSE 95 01/24/2023 1438   BUN 13 01/24/2023 1438   CREATININE 1.09 01/24/2023 1438   CREATININE 1.19 (H) 11/12/2019 1317   CALCIUM 9.4 01/24/2023 1438   PROT 6.9 01/24/2023 1438   ALBUMIN 4.2 01/24/2023 1438   AST 21 01/24/2023 1438   ALT 16 01/24/2023 1438   ALKPHOS 52 01/24/2023 1438   BILITOT 0.3 01/24/2023 1438   GFRNONAA 51 (L) 11/19/2019 1355   GFRAA 47 (L) 03/16/2018 2216      Latest Ref Rng & Units 01/24/2023    2:38 PM 02/14/2022    8:32 AM 11/09/2021    9:58 AM  Hepatic Function  Total Protein 6.0 - 8.3 g/dL 6.9  7.4  7.5   Albumin 3.5 - 5.2 g/dL 4.2  4.0  4.2   AST 0 - 37 U/L 21   20  18    ALT 0 - 35 U/L 16  18  13    Alk Phosphatase 39 - 117 U/L 52  59  57   Total Bilirubin 0.2 - 1.2 mg/dL 0.3  0.5  0.3   Bilirubin, Direct 0.0 - 0.3 mg/dL 0.1  0.1        Current Medications:    Current Outpatient Medications (Cardiovascular):    rosuvastatin (CRESTOR) 20 MG tablet, Take 1 tablet (20 mg total) by mouth daily.  Current Outpatient Medications (Respiratory):    cetirizine (ZYRTEC) 10 MG tablet, Take 10 mg by mouth daily.   montelukast (SINGULAIR) 10 MG tablet, TAKE 1 TABLET BY MOUTH AT BEDTIME    Current Outpatient Medications (Other):    carbamide peroxide (DEBROX) 6.5 % OTIC solution, Place 5 drops into the left ear 2 (two) times daily.   esomeprazole (NEXIUM) 40 MG capsule, Take 1 capsule by mouth once daily   FLUoxetine (PROZAC) 20 MG capsule, Take 1 capsule (20 mg total) by mouth daily.   linaclotide (LINZESS) 290 MCG CAPS capsule, Take 290  mcg by mouth daily before breakfast.   polyethylene glycol (MIRALAX) packet, Take 17 g by mouth daily.   risperiDONE (RISPERDAL) 0.5 MG tablet, 2 qhs   TURMERIC PO, Take by mouth.  Medical History:  Past Medical History:  Diagnosis Date   Anxiety    Frequent headaches    GERD (gastroesophageal reflux disease)    Hyperlipidemia    IBS (irritable bowel syndrome)    Pituitary insufficiency (HCC)    Thyroid disease    Allergies:  Allergies  Allergen Reactions   Other Other (See Comments)    Seasonal allergies   Gramineae Pollens Other (See Comments)    Sneezing, runny nose, watery eyes     Surgical History:  She  has a past surgical history that includes Brain surgery; Abdominal hysterectomy (1990); Ovarian cyst surgery (1994); Tonsillectomy (1973); Repair rectocele (2016); Anal Rectal manometry (N/A, 08/14/2018); Breast excisional biopsy (Right, unsure); Breast excisional biopsy (Left, unsure); eyelid surgery (Bilateral, 08/27/2021); and Breast biopsy (Left, 08/23/2022). Family History:  Her family history  includes Alcohol abuse in her father and mother; Colon cancer in her maternal aunt; Drug abuse in her father and mother; Heart disease in her father and mother; Sickle cell anemia in her mother.  REVIEW OF SYSTEMS  : All other systems reviewed and negative except where noted in the History of Present Illness.  PHYSICAL EXAM: BP 98/60 (BP Location: Left Arm, Patient Position: Sitting, Cuff Size: Normal)   Pulse 66   Ht 5\' 3"  (1.6 m)   Wt 143 lb (64.9 kg)   LMP  (LMP Unknown)   SpO2 100%   BMI 25.33 kg/m  General Appearance: Well nourished, in no apparent distress. Head:   Normocephalic and atraumatic. Eyes:  sclerae anicteric,conjunctive pink  Respiratory: Respiratory effort normal, BS equal bilaterally without rales, rhonchi, wheezing. Cardio: RRR with no MRGs. Peripheral pulses intact.  Abdomen: Soft,  Non-distended ,active bowel sounds. No tenderness . Without guarding and Without rebound. No masses. Rectal: Not evaluated Musculoskeletal: Full ROM, Normal gait. Without edema. Skin:  Dry and intact without significant lesions or rashes Neuro: Alert and  oriented x4;  No focal deficits. Psych:  Cooperative. Normal mood and affect.    Doree Albee, PA-C 11:22 AM

## 2023-02-13 ENCOUNTER — Ambulatory Visit (INDEPENDENT_AMBULATORY_CARE_PROVIDER_SITE_OTHER): Payer: Medicare HMO | Admitting: Physician Assistant

## 2023-02-13 ENCOUNTER — Encounter: Payer: Self-pay | Admitting: Physician Assistant

## 2023-02-13 ENCOUNTER — Encounter: Payer: Self-pay | Admitting: Gastroenterology

## 2023-02-13 VITALS — BP 98/60 | HR 66 | Ht 63.0 in | Wt 143.0 lb

## 2023-02-13 DIAGNOSIS — R1310 Dysphagia, unspecified: Secondary | ICD-10-CM

## 2023-02-13 DIAGNOSIS — D649 Anemia, unspecified: Secondary | ICD-10-CM | POA: Diagnosis not present

## 2023-02-13 DIAGNOSIS — K5900 Constipation, unspecified: Secondary | ICD-10-CM | POA: Diagnosis not present

## 2023-02-13 DIAGNOSIS — K5730 Diverticulosis of large intestine without perforation or abscess without bleeding: Secondary | ICD-10-CM | POA: Diagnosis not present

## 2023-02-13 DIAGNOSIS — R1319 Other dysphagia: Secondary | ICD-10-CM

## 2023-02-13 DIAGNOSIS — K581 Irritable bowel syndrome with constipation: Secondary | ICD-10-CM

## 2023-02-13 DIAGNOSIS — K219 Gastro-esophageal reflux disease without esophagitis: Secondary | ICD-10-CM

## 2023-02-13 DIAGNOSIS — K649 Unspecified hemorrhoids: Secondary | ICD-10-CM | POA: Diagnosis not present

## 2023-02-13 NOTE — Patient Instructions (Addendum)
 Dysphagia precautions:  1. Take reflux medications 30+ minutes before food in the morning 2. Begin meals with warm beverage 3. Eat smaller more frequent meals 4. Eat slowly, taking small bites and sips 5. Alternate solids and liquids 6. Avoid foods/liquids that increase acid production 7. Sit upright during and for 30+ minutes after meals to facilitate esophageal clearing 8. All meats should be chopped finely.   If something gets hung in your esophagus and will not come up or go down, proceed to the emergency room.    Miralax is an osmotic laxative. OKAY TO TAKE WITH THE LINZESS IF YOU NEED SOFT STOOLS, SAFE TO TAKE DAILY It only brings more water into the stool.  This is safe to take daily.  Can take up to 17 gram of miralax twice a day.  Mix with juice or coffee.  Start 1 capful at night for 3-4 days and reassess your response in 3-4 days.  You can increase and decrease the dose based on your response.  Remember, it can take up to 3-4 days to take effect OR for the effects to wear off.   I often pair this with benefiber in the morning to help assure the stool is not too loose.    Diverticulosis Diverticulosis is a condition that develops when small pouches (diverticula) form in the wall of the large intestine (colon). The colon is where water is absorbed and stool (feces) is formed. The pouches form when the inside layer of the colon pushes through weak spots in the outer layers of the colon. You may have a few pouches or many of them. The pouches usually do not cause problems unless they become inflamed or infected. When this happens, the condition is called diverticulitis- this is left lower quadrant pain, diarrhea, fever, chills, nausea or vomiting.  If this occurs please call the office or go to the hospital. Sometimes these patches without inflammation can also have painless bleeding associated with them, if this happens please call the office or go to the hospital. Preventing  constipation and increasing fiber can help reduce diverticula and prevent complications. Even if you feel you have a high-fiber diet, suggest getting on Benefiber or Cirtracel 2 times daily.

## 2023-02-14 ENCOUNTER — Ambulatory Visit: Payer: Medicare HMO | Admitting: Nurse Practitioner

## 2023-02-16 ENCOUNTER — Encounter: Payer: Self-pay | Admitting: Certified Registered Nurse Anesthetist

## 2023-02-20 ENCOUNTER — Encounter: Payer: Self-pay | Admitting: Gastroenterology

## 2023-02-20 ENCOUNTER — Ambulatory Visit: Payer: Medicare HMO | Admitting: Gastroenterology

## 2023-02-20 VITALS — BP 145/76 | HR 83 | Temp 98.1°F | Resp 14 | Ht 63.0 in | Wt 143.0 lb

## 2023-02-20 DIAGNOSIS — K449 Diaphragmatic hernia without obstruction or gangrene: Secondary | ICD-10-CM | POA: Diagnosis not present

## 2023-02-20 DIAGNOSIS — B3781 Candidal esophagitis: Secondary | ICD-10-CM | POA: Diagnosis not present

## 2023-02-20 DIAGNOSIS — K219 Gastro-esophageal reflux disease without esophagitis: Secondary | ICD-10-CM | POA: Diagnosis not present

## 2023-02-20 DIAGNOSIS — F419 Anxiety disorder, unspecified: Secondary | ICD-10-CM | POA: Diagnosis not present

## 2023-02-20 DIAGNOSIS — R1310 Dysphagia, unspecified: Secondary | ICD-10-CM

## 2023-02-20 DIAGNOSIS — K222 Esophageal obstruction: Secondary | ICD-10-CM

## 2023-02-20 DIAGNOSIS — E785 Hyperlipidemia, unspecified: Secondary | ICD-10-CM | POA: Diagnosis not present

## 2023-02-20 DIAGNOSIS — R1319 Other dysphagia: Secondary | ICD-10-CM

## 2023-02-20 MED ORDER — SODIUM CHLORIDE 0.9 % IV SOLN: 500.0000 mL | INTRAVENOUS | Status: DC

## 2023-02-20 MED ORDER — PANTOPRAZOLE SODIUM 40 MG PO TBEC: 40.0000 mg | DELAYED_RELEASE_TABLET | Freq: Every day | ORAL | 3 refills | Status: DC

## 2023-02-20 NOTE — Op Note (Signed)
[facility] Patient Name: Nancy Herman Procedure Date: 02/20/2023 10:06 AM MRN: [id number] Endoscopist: Napoleon Form , MD, [id number] Age: 78 Referring MD:  Date of Birth: [birth year] Gender: Female Account #: [id number] Procedure:                Upper GI endoscopy Indications:              Dysphagia, Esophageal reflux symptoms that persist                            despite appropriate therapy Medicines:                Monitored Anesthesia Care Procedure:                Pre-Anesthesia Assessment:                           - Prior to the procedure, a History and Physical                            was performed, and patient medications and                            allergies were reviewed. The patient's tolerance of                            previous anesthesia was also reviewed. The risks                            and benefits of the procedure and the sedation                            options and risks were discussed with the patient.                            All questions were answered, and informed consent                            was obtained. Prior Anticoagulants: The patient has                            taken no anticoagulant or antiplatelet agents. ASA                            Grade Assessment: II - A patient with mild systemic                            disease. After reviewing the risks and benefits,                            the patient was deemed in satisfactory condition to                            undergo the procedure.  After obtaining informed consent, the endoscope was                            passed under direct vision. Throughout the                            procedure, the patient's blood pressure, pulse, and                            oxygen saturations were monitored continuously. The                            GIF [id number] #[id number] was introduced through the                            mouth, and  advanced to the second part of duodenum.                            The upper GI endoscopy was accomplished without                            difficulty. The patient tolerated the procedure                            well. Scope In: Scope Out: Findings:                 The Z-line was regular and was found 38 cm from the                            incisors.                           One benign-appearing, intrinsic mild stenosis was                            found 37 to 38 cm from the incisors. The stenosis                            was traversed. The scope was withdrawn. Dilation                            was performed with a Maloney dilator with no                            resistance at 48 Fr. The dilation site was examined                            following endoscope reinsertion and showed mild                            improvement in luminal narrowing. Biopsies were  taken with a cold forceps for histology.                           A 2 cm hiatal hernia was present.                           The exam of the stomach was otherwise normal.                           The examined duodenum was normal. Complications:            No immediate complications. Estimated Blood Loss:     Estimated blood loss was minimal. Impression:               - Z-line regular, 38 cm from the incisors.                           - Benign-appearing esophageal stenosis. Dilated.                            Biopsied.                           - 2 cm hiatal hernia.                           - Normal examined duodenum. Recommendation:           - Resume previous diet.                           - Continue present medications.                           - Await pathology results.                           - Follow an antireflux regimen.                           - Use Protonix (pantoprazole) 40 mg PO daily. Napoleon Form, MD 02/20/2023 10:38:35 AM This report has been signed  electronically.

## 2023-02-20 NOTE — Patient Instructions (Addendum)
Resume previous diet Continue present medications, stop Nexium and pick up new Rx for Protonix (Pantoprazole) 40 mg by mouth daily Follow anti reflux regime (see information re: GERD) Await pathology results  Handouts/information given for hiatal hernia, esophageal stricture  YOU HAD AN ENDOSCOPIC PROCEDURE TODAY AT THE [facility]:   Refer to the procedure report that was given to you for any specific questions about what was found during the examination.  If the procedure report does not answer your questions, please call your gastroenterologist to clarify.  If you requested that your care partner not be given the details of your procedure findings, then the procedure report has been included in a sealed envelope for you to review at your convenience later.  YOU SHOULD EXPECT: Some feelings of bloating in the abdomen. Passage of more gas than usual.  Walking can help get rid of the air that was put into your GI tract during the procedure and reduce the bloating. If you had a lower endoscopy (such as a colonoscopy or flexible sigmoidoscopy) you may notice spotting of blood in your stool or on the toilet paper. If you underwent a bowel prep for your procedure, you may not have a normal bowel movement for a few days.  Please Note:  You might notice some irritation and congestion in your nose or some drainage.  This is from the oxygen used during your procedure.  There is no need for concern and it should clear up in a day or so.  SYMPTOMS TO REPORT IMMEDIATELY:  Following upper endoscopy (EGD)  Vomiting of blood or coffee ground material  New chest pain or pain under the shoulder blades  Painful or persistently difficult swallowing  New shortness of breath  Fever of 100F or higher  Black, tarry-looking stools  For urgent or emergent issues, a gastroenterologist can be reached at any hour by calling [phone]. Do not use MyChart messaging for urgent concerns.   DIET:  We do  recommend a small meal at first, but then you may proceed to your regular diet.  Drink plenty of fluids but you should avoid alcoholic beverages for 24 hours.  ACTIVITY:  You should plan to take it easy for the rest of today and you should NOT DRIVE or use heavy machinery until tomorrow (because of the sedation medicines used during the test).    FOLLOW UP: Our staff will call the number listed on your records the next business day following your procedure.  We will call around 7:15- 8:00 am to check on you and address any questions or concerns that you may have regarding the information given to you following your procedure. If we do not reach you, we will leave a message.     If any biopsies were taken you will be contacted by phone or by letter within the next 1-3 weeks.  Please call us at [phone] if you have not heard about the biopsies in 3 weeks.   SIGNATURES/CONFIDENTIALITY: You and/or your care partner have signed paperwork which will be entered into your electronic medical record.  These signatures attest to the fact that that the information above on your After Visit Summary has been reviewed and is understood.  Full responsibility of the confidentiality of this discharge information lies with you and/or your care-partner.

## 2023-02-20 NOTE — Progress Notes (Signed)
[facility] History and Physical   Primary Care Physician:  [place], Betty G, MD   Reason for Procedure:  Dysphagia, GERD  Plan:    EGD and colonoscopy with possible interventions as needed     HPI: Nancy Herman is a very pleasant 78 y.o. female here for EGD for evaluation and management of dysphagia and persistent GERD symptoms.   The risks and benefits as well as alternatives of endoscopic procedure(s) have been discussed and reviewed. All questions answered. The patient agrees to proceed.    Past Medical History:  Diagnosis Date   Anxiety    Frequent headaches    GERD (gastroesophageal reflux disease)    Hyperlipidemia    IBS (irritable bowel syndrome)    Pituitary insufficiency (HCC)    Thyroid disease     Past Surgical History:  Procedure Laterality Date   ABDOMINAL HYSTERECTOMY  1990   ANAL RECTAL MANOMETRY N/A 08/14/2018   Procedure: ANO RECTAL MANOMETRY;  Surgeon: Napoleon Form, MD;  Location: [facility];  Service: Endoscopy;  Laterality: N/A;   BRAIN SURGERY     BREAST BIOPSY Left 08/23/2022   MM LT BREAST BX W LOC DEV 1ST LESION IMAGE BX SPEC STEREO GUIDE 08/23/2022 [facility]   BREAST EXCISIONAL BIOPSY Right unsure   BREAST EXCISIONAL BIOPSY Left unsure   eyelid surgery Bilateral 08/27/2021   OVARIAN CYST SURGERY  1994   REPAIR RECTOCELE  2016   and prolapsed uterus.   TONSILLECTOMY  1973    Prior to Admission medications   Medication Sig Start Date End Date Taking? Authorizing Provider  cetirizine (ZYRTEC) 10 MG tablet Take 10 mg by mouth daily.   Yes [provider]  esomeprazole (NEXIUM) 40 MG capsule Take 1 capsule by mouth once daily 01/10/23  Yes [place], Betty G, MD  FLUoxetine (PROZAC) 20 MG capsule Take 1 capsule (20 mg total) by mouth daily. 10/15/22  Yes Plovsky, Earvin Hansen, MD  linaclotide (LINZESS) 290 MCG CAPS capsule Take 290 mcg by mouth daily before breakfast.   Yes [provider]   montelukast (SINGULAIR) 10 MG tablet TAKE 1 TABLET BY MOUTH AT BEDTIME 09/04/22  Yes [place], Betty G, MD  polyethylene glycol [facility]) packet Take 17 g by mouth daily. 02/22/18  Yes Petrucelli, Samantha R, PA-C  risperiDONE (RISPERDAL) 0.5 MG tablet 2 qhs 10/15/22  Yes Plovsky, Earvin Hansen, MD  rosuvastatin (CRESTOR) 20 MG tablet Take 1 tablet (20 mg total) by mouth daily. 11/12/22  Yes [place], Betty G, MD  TURMERIC PO Take by mouth.   Yes [provider]  carbamide peroxide (DEBROX) 6.5 % OTIC solution Place 5 drops into the left ear 2 (two) times daily. 05/10/19   [place], Betty G, MD    Current Outpatient Medications  Medication Sig Dispense Refill   cetirizine (ZYRTEC) 10 MG tablet Take 10 mg by mouth daily.     esomeprazole (NEXIUM) 40 MG capsule Take 1 capsule by mouth once daily 90 capsule 2   FLUoxetine (PROZAC) 20 MG capsule Take 1 capsule (20 mg total) by mouth daily. 30 capsule 7   linaclotide (LINZESS) 290 MCG CAPS capsule Take 290 mcg by mouth daily before breakfast.     montelukast (SINGULAIR) 10 MG tablet TAKE 1 TABLET BY MOUTH AT BEDTIME 90 tablet 2   polyethylene glycol (MIRALAX) packet Take 17 g by mouth daily. 14 each 0   risperiDONE (RISPERDAL) 0.5 MG tablet 2 qhs 60 tablet 7   rosuvastatin (CRESTOR) 20 MG tablet Take 1  tablet (20 mg total) by mouth daily. 90 tablet 3   TURMERIC PO Take by mouth.     carbamide peroxide (DEBROX) 6.5 % OTIC solution Place 5 drops into the left ear 2 (two) times daily. 15 mL 0   Current Facility-Administered Medications  Medication Dose Route Frequency Provider Last Rate Last Admin   0.9 %  sodium chloride infusion  500 mL Intravenous Continuous Shelvy Perazzo, Eleonore Chiquito, MD        Allergies as of 02/20/2023 - Review Complete 02/20/2023  Allergen Reaction Noted   Other Other (See Comments) 10/28/2017   Gramineae pollens Other (See Comments) 08/15/2021    Family History  Problem Relation Age of Onset   Heart disease Mother        No  details    Alcohol abuse Mother    Drug abuse Mother    Sickle cell anemia Mother    Heart disease Father        No details . Questionable bypass or stent   Drug abuse Father    Alcohol abuse Father    Colon cancer Maternal Aunt    Esophageal cancer Neg Hx    Pancreatic cancer Neg Hx    Stomach cancer Neg Hx     Social History   Socioeconomic History   Marital status: Widowed    Spouse name: Not on file   Number of children: 2   Years of education: Not on file   Highest education level: Not on file  Occupational History   Occupation: retired  Tobacco Use   Smoking status: Never   Smokeless tobacco: Never  Vaping Use   Vaping status: Never Used  Substance and Sexual Activity   Alcohol use: No   Drug use: No   Sexual activity: Never  Other Topics Concern   Not on file  Social History Narrative   Lives alone.  Lives near son.  Was in [place].   Social Drivers of Corporate investment banker Strain: Low Risk  (12/02/2022)   Overall Financial Resource Strain (CARDIA)    Difficulty of Paying Living Expenses: Not hard at all  Food Insecurity: No Food Insecurity (12/02/2022)   Hunger Vital Sign    Worried About Running Out of Food in the Last Year: Never true    Ran Out of Food in the Last Year: Never true  Transportation Needs: No Transportation Needs (12/02/2022)   PRAPARE - Administrator, Civil Service (Medical): No    Lack of Transportation (Non-Medical): No  Physical Activity: Inactive (12/02/2022)   Exercise Vital Sign    Days of Exercise per Week: 0 days    Minutes of Exercise per Session: 0 min  Stress: No Stress Concern Present (12/02/2022)   [employer] of Occupational Health - Occupational Stress Questionnaire    Feeling of Stress : Not at all  Social Connections: Moderately Isolated (11/14/2020)   Social Connection and Isolation Panel [NHANES]    Frequency of Communication with Friends and Family: More than three times a week    Frequency  of Social Gatherings with Friends and Family: Once a week    Attends Religious Services: 1 to 4 times per year    Active Member of [employer] or Organizations: No    Attends Banker Meetings: Never    Marital Status: Widowed  Intimate Partner Violence: Not At Risk (12/02/2022)   Humiliation, Afraid, Rape, and Kick questionnaire    Fear of Current or Ex-Partner: No  Emotionally Abused: No    Physically Abused: No    Sexually Abused: No    Review of Systems:  All other review of systems negative except as mentioned in the HPI.  Physical Exam: Vital signs in last 24 hours: BP 108/64   Pulse 72   Temp 98.1 F (36.7 C)   Ht 5\' 3"  (1.6 m)   Wt 143 lb (64.9 kg)   LMP  (LMP Unknown)   SpO2 100%   BMI 25.33 kg/m  General:   Alert, NAD Lungs:  Clear .   Heart:  Regular rate and rhythm Abdomen:  Soft, nontender and nondistended. Neuro/Psych:  Alert and cooperative. Normal mood and affect. A and O x 3  Reviewed labs, radiology imaging, old records and pertinent past GI work up  Patient is appropriate for planned procedure(s) and anesthesia in an ambulatory setting   K. Scherry Ran , MD [phone]

## 2023-02-20 NOTE — Progress Notes (Signed)
Report given to PACU, vss 

## 2023-02-20 NOTE — Progress Notes (Signed)
1016 Robinul 0.1 mg IV given due large amount of secretions upon assessment.  MD made aware, vss 

## 2023-02-21 ENCOUNTER — Telehealth: Payer: Self-pay | Admitting: *Deleted

## 2023-02-21 NOTE — Telephone Encounter (Signed)
  Follow up Call-     02/20/2023    9:20 AM 09/04/2022    7:13 AM  Call back number  Post procedure Call Back phone  # [phone]  Permission to leave phone message Yes Yes     Patient questions:  Do you have a fever, pain , or abdominal swelling? No. Pain Score  0 *  Have you tolerated food without any problems? Yes.    Have you been able to return to your normal activities? Yes.    Do you have any questions about your discharge instructions: Diet   No. Medications  No. Follow up visit  No.  Do you have questions or concerns about your Care? No.  Actions: * If pain score is 4 or above: No action needed, pain <4.

## 2023-02-25 LAB — SURGICAL PATHOLOGY

## 2023-03-05 ENCOUNTER — Encounter: Payer: Self-pay | Admitting: Podiatry

## 2023-03-05 ENCOUNTER — Ambulatory Visit (INDEPENDENT_AMBULATORY_CARE_PROVIDER_SITE_OTHER): Payer: Medicare HMO | Admitting: Podiatry

## 2023-03-05 DIAGNOSIS — L84 Corns and callosities: Secondary | ICD-10-CM

## 2023-03-05 DIAGNOSIS — R7303 Prediabetes: Secondary | ICD-10-CM | POA: Diagnosis not present

## 2023-03-05 NOTE — Progress Notes (Signed)
  Subjective:  Patient ID: Nancy Herman, female    DOB: 1945-09-02,   MRN: [id number]  No chief complaint on file.   78 y.o. female presents for concern of callus on her right foot.  Requesting to have them trimmed today. . Denies  burning and tingling in their feet. Patient is pre-diabetic.   PCP:  [place], Betty G, MD    . Denies any other pedal complaints. Denies n/v/f/c.   Past Medical History:  Diagnosis Date   Anxiety    Frequent headaches    GERD (gastroesophageal reflux disease)    Hyperlipidemia    IBS (irritable bowel syndrome)    Pituitary insufficiency (HCC)    Thyroid disease     Objective:  Physical Exam: Vascular: DP/PT pulses 2/4 bilateral. CFT <3 seconds. Absent hair growth on digits. Edema noted to bilateral lower extremities. Xerosis noted bilaterally.  Skin. No lacerations or abrasions bilateral feet. Nails 1-5 bilateral  are thickened discolored with subungual debris. Hyperkeratotic tissue noted to plantar third metatarsal right foot.  Musculoskeletal: MMT 5/5 bilateral lower extremities in DF, PF, Inversion and Eversion. Deceased ROM in DF of ankle joint.  Neurological: Sensation intact to light touch. Protective sensation intact bilateral.    Assessment:   1. Callus of foot   2. Prediabetes       Plan:  Patient was evaluated and treated and all questions answered. -ABN signed.  -Discussed and educated patient on diabetic foot care, especially with  regards to the vascular, neurological and musculoskeletal systems.  -Stressed the importance of good glycemic control and the detriment of not  controlling glucose levels in relation to the foot. -Discussed corns and calluses with patient and treatment options.  -Hyperkeratotic tissue was debrided with chisel without incident.  -Applied salycylic acid treatment to area with dressing. Advised to remove bandaging tomorrow.  -Encouraged daily moisturizing -Discussed use of pumice stone -Advised  good supportive shoes and inserts -Patient to return to office as needed or sooner if condition worsens.    Louann Sjogren, DPM

## 2023-03-13 ENCOUNTER — Other Ambulatory Visit: Payer: Self-pay

## 2023-03-13 MED ORDER — FLUCONAZOLE 100 MG PO TABS: 100.0000 mg | ORAL_TABLET | Freq: Every day | ORAL | 0 refills | Status: DC

## 2023-04-14 ENCOUNTER — Telehealth: Payer: Self-pay | Admitting: Gastroenterology

## 2023-04-14 NOTE — Telephone Encounter (Signed)
 Inbound call from patient requesting a call to discuss Linzess. States she had information faxed over so she is able to receive a refill for medication. Requesting to know if information was received and if she is able to pick up medication today. Please advise, thank you.

## 2023-04-14 NOTE — Telephone Encounter (Signed)
 I found the abbvie assistance paperwork in Dr Elana Alm office. I have started filling it out , it is just her yearly renewal. Nancy Herman came by and signed the form. We will get Dr Lavon Paganini to sign it tomorrow as she is off today.

## 2023-04-16 ENCOUNTER — Ambulatory Visit (HOSPITAL_COMMUNITY): Payer: Medicare HMO | Admitting: Psychiatry

## 2023-05-02 ENCOUNTER — Other Ambulatory Visit: Payer: Self-pay

## 2023-05-02 ENCOUNTER — Encounter (HOSPITAL_COMMUNITY): Payer: Self-pay | Admitting: Psychiatry

## 2023-05-02 ENCOUNTER — Ambulatory Visit (HOSPITAL_COMMUNITY): Admitting: Psychiatry

## 2023-05-02 VITALS — BP 105/66 | HR 78 | Ht 63.0 in | Wt 143.0 lb

## 2023-05-02 DIAGNOSIS — F411 Generalized anxiety disorder: Secondary | ICD-10-CM

## 2023-05-02 DIAGNOSIS — F323 Major depressive disorder, single episode, severe with psychotic features: Secondary | ICD-10-CM | POA: Diagnosis not present

## 2023-05-02 DIAGNOSIS — F32A Depression, unspecified: Secondary | ICD-10-CM

## 2023-05-02 MED ORDER — RISPERIDONE 0.5 MG PO TABS: ORAL_TABLET | ORAL | 7 refills | Status: DC

## 2023-05-02 MED ORDER — FLUOXETINE HCL 20 MG PO CAPS: 20.0000 mg | ORAL_CAPSULE | Freq: Every day | ORAL | 7 refills | Status: DC

## 2023-05-02 NOTE — Progress Notes (Signed)
 Psychiatric Initial Adult Assessment   Patient Identification: Nancy Herman MRN:  [id number] Date of Evaluation:  05/02/2023 Referral Source: Dr. Betty Swazil[place] Chief Complaint:     Visit Diagnosis:    ICD-10-CM   1. Generalized anxiety disorder  F41.1 FLUoxetine (PROZAC) 20 MG capsule    2. Depressive disorder  F32.A FLUoxetine (PROZAC) 20 MG capsule       History of Present Illness:      Today the patient is doing well.  It turns out that her roof was no problem but her water heater had broken down.  She now has a new water tank.  Now that is done she feels better.  Her daughter is going to give her half the money to pay for it.  Patient financially seems to be fairly okay.  She owns her own townhouse.  The patient is sleeping and eating reasonably well.  Her appetite is not great.  She has lost a little bit of weight.  Her dog Coby([employer] after [employer] basketball player) is doing well.  The patient denies any suspiciousness.  She has no evidence of psychosis.  She is functioning very well.  All her family is good.  The patient continues to drive" and to go to church.  He denies daily depression.    Depression Symptoms:  difficulty concentrating, (Hypo) Manic Symptoms:   Anxiety Symptoms:   Psychotic Symptoms:   PTSD Symptoms:   Past Psychiatric History: Prozac for many years  Previous Psychotropic Medications:   Substance Abuse History in the last 12 months:  No.  Consequences of Substance Abuse:   Past Medical History:  Past Medical History:  Diagnosis Date   Anxiety    Frequent headaches    GERD (gastroesophageal reflux disease)    Hyperlipidemia    IBS (irritable bowel syndrome)    Pituitary insufficiency (HCC)    Thyroid disease     Past Surgical History:  Procedure Laterality Date   ABDOMINAL HYSTERECTOMY  1990   ANAL RECTAL MANOMETRY N/A 08/14/2018   Procedure: ANO RECTAL MANOMETRY;  Surgeon: Napoleon Form, MD;  Location: [facility];  Service:  Endoscopy;  Laterality: N/A;   BRAIN SURGERY     BREAST BIOPSY Left 08/23/2022   MM LT BREAST BX W LOC DEV 1ST LESION IMAGE BX SPEC STEREO GUIDE 08/23/2022 [facility]   BREAST EXCISIONAL BIOPSY Right unsure   BREAST EXCISIONAL BIOPSY Left unsure   eyelid surgery Bilateral 08/27/2021   OVARIAN CYST SURGERY  1994   REPAIR RECTOCELE  2016   and prolapsed uterus.   TONSILLECTOMY  1973    Family Psychiatric History:   Family History:  Family History  Problem Relation Age of Onset   Heart disease Mother        No details    Alcohol abuse Mother    Drug abuse Mother    Sickle cell anemia Mother    Heart disease Father        No details . Questionable bypass or stent   Drug abuse Father    Alcohol abuse Father    Colon cancer Maternal Aunt    Esophageal cancer Neg Hx    Pancreatic cancer Neg Hx    Stomach cancer Neg Hx     Social History:   Social History   Socioeconomic History   Marital status: Widowed    Spouse name: Not on file   Number of children: 2   Years of education: Not on file   Highest education  level: Not on file  Occupational History   Occupation: retired  Tobacco Use   Smoking status: Never   Smokeless tobacco: Never  Vaping Use   Vaping status: Never Used  Substance and Sexual Activity   Alcohol use: No   Drug use: No   Sexual activity: Never  Other Topics Concern   Not on file  Social History Narrative   Lives alone.  Lives near son.  Was in [place].   Social Drivers of Corporate investment banker Strain: Low Risk  (12/02/2022)   Overall Financial Resource Strain (CARDIA)    Difficulty of Paying Living Expenses: Not hard at all  Food Insecurity: No Food Insecurity (12/02/2022)   Hunger Vital Sign    Worried About Running Out of Food in the Last Year: Never true    Ran Out of Food in the Last Year: Never true  Transportation Needs: No Transportation Needs (12/02/2022)   PRAPARE - Administrator, Civil Service (Medical): No     Lack of Transportation (Non-Medical): No  Physical Activity: Inactive (12/02/2022)   Exercise Vital Sign    Days of Exercise per Week: 0 days    Minutes of Exercise per Session: 0 min  Stress: No Stress Concern Present (12/02/2022)   [employer] of Occupational Health - Occupational Stress Questionnaire    Feeling of Stress : Not at all  Social Connections: Moderately Isolated (11/14/2020)   Social Connection and Isolation Panel [NHANES]    Frequency of Communication with Friends and Family: More than three times a week    Frequency of Social Gatherings with Friends and Family: Once a week    Attends Religious Services: 1 to 4 times per year    Active Member of [employer] or Organizations: No    Attends Banker Meetings: Never    Marital Status: Widowed    Additional Social History:   Allergies:   Allergies  Allergen Reactions   Other Other (See Comments)    Seasonal allergies   Gramineae Pollens Other (See Comments)    Sneezing, runny nose, watery eyes    Metabolic Disorder Labs: Lab Results  Component Value Date   HGBA1C 6.0 11/11/2022   Lab Results  Component Value Date   PROLACTIN 15.8 11/13/2022   PROLACTIN 21.4 02/28/2020   Lab Results  Component Value Date   CHOL 186 11/11/2022   TRIG 58.0 11/11/2022   HDL 87.60 11/11/2022   CHOLHDL 2 11/11/2022   VLDL 11.6 11/11/2022   LDLCALC 87 11/11/2022   LDLCALC 97 02/14/2022   Lab Results  Component Value Date   TSH 2.09 11/13/2022    Therapeutic Level Labs: No results found for: "LITHIUM" No results found for: "CBMZ" No results found for: "VALPROATE"  Current Medications: Current Outpatient Medications  Medication Sig Dispense Refill   carbamide peroxide (DEBROX) 6.5 % OTIC solution Place 5 drops into the left ear 2 (two) times daily. 15 mL 0   cetirizine (ZYRTEC) 10 MG tablet Take 10 mg by mouth daily.     fluconazole (DIFLUCAN) 100 MG tablet Take 1 tablet (100 mg total) by mouth daily. 14  tablet 0   linaclotide (LINZESS) 290 MCG CAPS capsule Take 290 mcg by mouth daily before breakfast.     montelukast (SINGULAIR) 10 MG tablet TAKE 1 TABLET BY MOUTH AT BEDTIME 90 tablet 2   pantoprazole (PROTONIX) 40 MG tablet Take 1 tablet (40 mg total) by mouth daily. 90 tablet 3   polyethylene glycol (  MIRALAX) packet Take 17 g by mouth daily. 14 each 0   rosuvastatin (CRESTOR) 20 MG tablet Take 1 tablet (20 mg total) by mouth daily. 90 tablet 3   TURMERIC PO Take by mouth.     FLUoxetine (PROZAC) 20 MG capsule Take 1 capsule (20 mg total) by mouth daily. 30 capsule 7   risperiDONE (RISPERDAL) 0.5 MG tablet 2 qhs 60 tablet 7   No current facility-administered medications for this visit.    Musculoskeletal: Strength & Muscle Tone: within normal limits Gait & Station: normal Patient leans:   Psychiatric Specialty Exam: ROS  Blood pressure 105/66, pulse 78, height 5\' 3"  (1.6 m), weight 143 lb (64.9 kg).Body mass index is 25.33 kg/m.  General Appearance: Casual  Eye Contact:  Good  Speech: Good  Volume:  Normal  Mood:  NA  Affect:  NA and Appropriate  Thought Process:  Coherent  Orientation:  Full (Time, Place, and Person)  Thought Content:  Logical  Suicidal Thoughts:  No  Homicidal Thoughts:  No  Memory:  Negative  Judgement:  Good  Insight:  Fair  Psychomotor Activity:  Normal  Concentration:    Recall:  Good  Fund of Knowledge:Good  Language: Good  Akathisia:  No  Handed:    AIMS (if indicated):  not done  Assets:  Desire for Improvement  ADL's:  Intact  Cognition: WNL  Sleep:  Good   Screenings: GAD-7    Flowsheet Row Office Visit from 07/29/2022 in [facility] [place]  Total GAD-7 Score 2      PHQ2-9    Flowsheet Row Clinical Support from 12/02/2022 in [facility] [place] Office Visit from 11/11/2022 in [facility] [place] Office Visit from 07/29/2022 in [facility] [place] Clinical Support from 11/27/2021 in [facility] [place] Office Visit from 11/09/2021 in [facility] [place]  PHQ-2 Total Score 0 0 1 0 0  PHQ-9 Total Score 0 0 5 -- 1      Flowsheet Row ED from 03/16/2018 in [facility] Emergency Department at [facility] ED from 02/22/2018 in [facility] Department at [facility]  C-SSRS RISK CATEGORY No Risk No Risk       Assessment and Plan:  3/28/202510:55 AM   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  At this time we will continue her Risperdal 0.5 mg 2 at night which I have determined to be the lowest effective dose.  That is in the past and we tried to change and she became suspicious very uncomfortable and nearly paranoid.  Back on the Risperdal at a low dose she does well.  She also takes Prozac 20 mg.  Medically the patient is very stable.  She is functioning independently.  She will return to see me in 5 months.

## 2023-05-13 DIAGNOSIS — H43813 Vitreous degeneration, bilateral: Secondary | ICD-10-CM | POA: Diagnosis not present

## 2023-05-13 DIAGNOSIS — H25813 Combined forms of age-related cataract, bilateral: Secondary | ICD-10-CM | POA: Diagnosis not present

## 2023-05-27 ENCOUNTER — Other Ambulatory Visit: Payer: Self-pay | Admitting: Family Medicine

## 2023-05-27 DIAGNOSIS — J309 Allergic rhinitis, unspecified: Secondary | ICD-10-CM

## 2023-07-07 ENCOUNTER — Encounter: Payer: Self-pay | Admitting: Family Medicine

## 2023-07-07 ENCOUNTER — Telehealth: Payer: Self-pay

## 2023-07-07 ENCOUNTER — Ambulatory Visit: Payer: Self-pay

## 2023-07-07 ENCOUNTER — Telehealth: Payer: Self-pay | Admitting: *Deleted

## 2023-07-07 ENCOUNTER — Ambulatory Visit (INDEPENDENT_AMBULATORY_CARE_PROVIDER_SITE_OTHER)

## 2023-07-07 ENCOUNTER — Ambulatory Visit (INDEPENDENT_AMBULATORY_CARE_PROVIDER_SITE_OTHER): Admitting: Family Medicine

## 2023-07-07 ENCOUNTER — Ambulatory Visit: Payer: Self-pay | Admitting: Family Medicine

## 2023-07-07 VITALS — BP 96/64 | HR 68 | Temp 98.6°F | Ht 63.0 in | Wt 146.4 lb

## 2023-07-07 DIAGNOSIS — M79672 Pain in left foot: Secondary | ICD-10-CM | POA: Diagnosis not present

## 2023-07-07 DIAGNOSIS — S92352A Displaced fracture of fifth metatarsal bone, left foot, initial encounter for closed fracture: Secondary | ICD-10-CM | POA: Diagnosis not present

## 2023-07-07 DIAGNOSIS — R600 Localized edema: Secondary | ICD-10-CM

## 2023-07-07 DIAGNOSIS — W19XXXA Unspecified fall, initial encounter: Secondary | ICD-10-CM | POA: Diagnosis not present

## 2023-07-07 DIAGNOSIS — S93402A Sprain of unspecified ligament of left ankle, initial encounter: Secondary | ICD-10-CM | POA: Diagnosis not present

## 2023-07-07 DIAGNOSIS — S29011A Strain of muscle and tendon of front wall of thorax, initial encounter: Secondary | ICD-10-CM

## 2023-07-07 LAB — POC URINALSYSI DIPSTICK (AUTOMATED)
Bilirubin, UA: NEGATIVE
Blood, UA: NEGATIVE
Glucose, UA: NEGATIVE
Ketones, UA: NEGATIVE
Leukocytes, UA: NEGATIVE
Nitrite, UA: NEGATIVE
Protein, UA: NEGATIVE
Spec Grav, UA: 1.015 (ref 1.010–1.025)
Urobilinogen, UA: 0.2 U/dL
pH, UA: 6 (ref 5.0–8.0)

## 2023-07-07 NOTE — Telephone Encounter (Signed)
 Chief Complaint: Fall Symptoms: leg/arm pain Frequency: Yesterday Pertinent Negatives: Patient denies fever, dizziness, weakness Disposition: [] ED /[] Urgent Care (no appt availability in office) / [x] Appointment(In office/virtual)/ []  [place] [facility]/ [] Home Care/ [] Refused Recommended Disposition /[] [place] [facility]/ []  Follow-up with PCP Additional Notes: Pt notes her shoe was caught in the stairs yesterday and she had a fall. Pt notes twisting her right leg, denies hitting head, not on blood thinners. Pt reports pain at 8/10 with ambulation and some pain in arms with reaching. OV scheduled. This RN educated pt on home care, new-worsening symptoms, when to call back/seek emergent care. Pt verbalized understanding and agrees to plan.    Copied from CRM [id number]. Topic: Clinical - Red Word Triage >> Jul 07, 2023  8:57 AM Juluis Ok wrote: [facility] that prompted transfer to Nurse Triage: Fall on 06/01- LT foot/leg pain, RT knee pain Reason for Disposition  [1] MODERATE weakness (i.e., interferes with work, school, normal activities) AND [2] new-onset or worsening  Answer Assessment - Initial Assessment Questions 1. MECHANISM: "How did the fall happen?"     Walking down steps and heel got stuck in the step and pt lost balance 3. ONSET: "When did the fall happen?" (e.g., minutes, hours, or days ago)     Yesterday 4. LOCATION: "What part of the body hit the ground?" (e.g., back, buttocks, head, hips, knees, hands, head, stomach)     Bottom and right hip, twisted left leg 5. INJURY: "Did you hurt (injure) yourself when you fell?" If Yes, ask: "What did you injure? Tell me more about this?" (e.g., body area; type of injury; pain severity)"     Leg/arm pain 6. PAIN: "Is there any pain?" If Yes, ask: "How bad is the pain?" (e.g., Scale 1-10; or mild,  moderate, severe)   - NONE (0): No pain   - MILD (1-3): Doesn't interfere with normal activities    - MODERATE (4-7): Interferes with  normal activities or awakens from sleep    - SEVERE (8-10): Excruciating pain, unable to do any normal activities      With ambulation 8/10 7. SIZE: For cuts, bruises, or swelling, ask: "How large is it?" (e.g., inches or centimeters)      Cut on right knee, left foot 9. OTHER SYMPTOMS: "Do you have any other symptoms?" (e.g., dizziness, fever, weakness; new onset or worsening).      None today 10. CAUSE: "What do you think caused the fall (or falling)?" (e.g., tripped, dizzy spell)       Tripped  Protocols used: Falls and [facility]

## 2023-07-07 NOTE — Progress Notes (Addendum)
 Established Patient Office Visit   Subjective  Patient ID: Nancy Herman, female    DOB: [birth year]  Age: 78 y.o. MRN: [id number]  Chief Complaint  Patient presents with   Medical Management of Chronic Issues    Fall, 6/1, left leg pain and swelling, rate of pain 7 out of 10, right shoulder pain    Patient is a 78 year old female followed by Dr. Swaziland and seen for acute concern.  Patient fell yesterday while walking down the stairs at church.  Patient states her heel got stuck underneath the step causing her to fall with her leg/ankle twisted landing on her butt and back.  Patient endorses right shoulder/chest pain, left foot pain with edema.  Patient states she had help getting up and was able to drive home.  Tried ice, elevation, and Tylenol  for ankle.  Tried to limit movement around the house.    Patient Active Problem List   Diagnosis Date Noted   S/P transsphenoidal hypophysectomy (HCC) 11/13/2022   Atherosclerosis of aorta (HCC) 11/11/2022   Routine general medical examination at a health care facility 11/11/2022   Iron deficiency anemia 11/11/2022   CKD (chronic kidney disease), stage III (HCC) 08/07/2022   Lightheadedness 07/29/2022   Chronic idiopathic constipation 07/23/2022   Hemorrhage of anus and rectum 07/23/2022   Right lower quadrant pain 07/23/2022   Dermatochalasis of both upper eyelids 05/18/2021   Prediabetes 05/09/2020   Osteopenia 11/23/2019   History of colonic polyps 06/11/2019   Dyssynergic defecation    Chest discomfort 05/05/2018   Dyslipidemia 05/05/2018   Major depressive disorder, single episode with psychotic features (HCC) 03/31/2018   Psychosis (HCC) 03/18/2018   H/O urinary frequency 02/09/2018   Cervicalgia 03/26/2017   Dysphagia 12/04/2016   Primary osteoarthritis of both knees 06/03/2016   Depressive disorder 05/21/2016   OAG (open angle glaucoma) suspect, low risk, bilateral 04/18/2016   Cough 03/28/2016   GERD (gastroesophageal  reflux disease) 11/06/2015   Irritable bowel syndrome with constipation 10/16/2015   Hyperlipidemia 10/16/2015   Insomnia disorder 10/16/2015   Anxiety disorder 10/16/2015   Allergic rhinitis 10/16/2015   Pituitary adenoma (HCC) 09/26/2015   Past Medical History:  Diagnosis Date   Anxiety    Frequent headaches    GERD (gastroesophageal reflux disease)    Hyperlipidemia    IBS (irritable bowel syndrome)    Pituitary insufficiency (HCC)    Thyroid  disease    Past Surgical History:  Procedure Laterality Date   ABDOMINAL HYSTERECTOMY  1990   ANAL RECTAL MANOMETRY N/A 08/14/2018   Procedure: ANO RECTAL MANOMETRY;  Surgeon: Sergio Dandy, MD;  Location: [facility];  Service: Endoscopy;  Laterality: N/A;   BRAIN SURGERY     BREAST BIOPSY Left 08/23/2022   MM LT BREAST BX W LOC DEV 1ST LESION IMAGE BX SPEC STEREO GUIDE 08/23/2022 [facility] MAMMOGRAPHY   BREAST EXCISIONAL BIOPSY Right unsure   BREAST EXCISIONAL BIOPSY Left unsure   eyelid surgery Bilateral 08/27/2021   OVARIAN CYST SURGERY  1994   REPAIR RECTOCELE  2016   and prolapsed uterus.   TONSILLECTOMY  1973   Social History   Tobacco Use   Smoking status: Never   Smokeless tobacco: Never  Vaping Use   Vaping status: Never Used  Substance Use Topics   Alcohol use: No   Drug use: No   Family History  Problem Relation Age of Onset   Heart disease Mother        No details  Alcohol abuse Mother    Drug abuse Mother    Sickle cell anemia Mother    Heart disease Father        No details . Questionable bypass or stent   Drug abuse Father    Alcohol abuse Father    Colon cancer Maternal Aunt    Esophageal cancer Neg Hx    Pancreatic cancer Neg Hx    Stomach cancer Neg Hx    Allergies  Allergen Reactions   Other Other (See Comments)    Seasonal allergies   Gramineae Pollens Other (See Comments)    Sneezing, runny nose, watery eyes    ROS Negative unless stated above    Objective:      BP 96/64  (BP Location: Left Arm, Patient Position: Sitting, Cuff Size: Normal)   Pulse 68   Temp 98.6 F (37 C) (Oral)   Ht 5\' 3"  (1.6 m)   Wt 146 lb 6.4 oz (66.4 kg)   LMP  (LMP Unknown)   SpO2 98%   BMI 25.93 kg/m  BP Readings from Last 3 Encounters:  07/07/23 96/64  02/20/23 (!) 145/76  02/13/23 98/60   Wt Readings from Last 3 Encounters:  07/07/23 146 lb 6.4 oz (66.4 kg)  02/20/23 143 lb (64.9 kg)  02/13/23 143 lb (64.9 kg)      Physical Exam Constitutional:      General: She is not in acute distress.    Appearance: Normal appearance.  HENT:     Head: Normocephalic and atraumatic.     Nose: Nose normal.     Mouth/Throat:     Mouth: Mucous membranes are moist.  Cardiovascular:     Rate and Rhythm: Normal rate and regular rhythm.     Heart sounds: Normal heart sounds. No murmur heard.    No gallop.  Pulmonary:     Effort: Pulmonary effort is normal. No respiratory distress.     Breath sounds: Normal breath sounds. No wheezing, rhonchi or rales.  Musculoskeletal:       Arms:       Legs:     Comments: Left ankle edema to mid foot.  TTP of left lateral malleolus and fifth metatarsal.  Limited ROM 2/2 pt discomfort.  FRO active M of right shoulder.  TTP of right upper chest.  No TTP of posterior shoulder.  Skin:    General: Skin is warm and dry.  Neurological:     Mental Status: She is alert and oriented to person, place, and time.        12/02/2022    9:35 AM 11/11/2022    9:21 AM 07/29/2022   12:45 PM  Depression screen PHQ 2/9  Decreased Interest 0 0 1  Down, Depressed, Hopeless 0 0 0  PHQ - 2 Score 0 0 1  Altered sleeping 0 0 1  Tired, decreased energy 0 0 1  Change in appetite 0 0 1  Feeling bad or failure about yourself  0 0 0  Trouble concentrating 0 0 1  Moving slowly or fidgety/restless 0 0 0  Suicidal thoughts 0 0 0  PHQ-9 Score 0 0 5  Difficult doing work/chores Not difficult at all Not difficult at all Not difficult at all      07/29/2022   12:45  PM  GAD 7 : Generalized Anxiety Score  Nervous, Anxious, on Edge 0  Control/stop worrying 1  Worry too much - different things 1  Trouble relaxing 0  Restless 0  Easily  annoyed or irritable 0  Afraid - awful might happen 0  Total GAD 7 Score 2  Anxiety Difficulty Not difficult at all     Results for orders placed or performed in visit on 07/07/23  POCT Urinalysis Dipstick (Automated)  Result Value Ref Range   Color, UA Yellow    Clarity, UA clear    Glucose, UA Negative Negative   Bilirubin, UA neg    Ketones, UA neg    Spec Grav, UA 1.015 1.010 - 1.025   Blood, UA neg    pH, UA 6.0 5.0 - 8.0   Protein, UA Negative Negative   Urobilinogen, UA 0.2 0.2 or 1.0 E.U./dL   Nitrite, UA neg    Leukocytes, UA Negative Negative      Assessment & Plan:   Left foot pain -     DG Foot Complete Left; Future  Edema of left foot -     DG Foot Complete Left; Future  Sprain of left ankle, unspecified ligament, initial encounter -     DG Foot Complete Left; Future  Strain of right pectoralis muscle, initial encounter  Fall, initial encounter -     POCT Urinalysis Dipstick (Automated)  Patient with acute left foot pain and edema status post fall.  Concern for severe sprain and possible metatarsal fracture.  POC UA negative.  Discussed supportive care including ice, heat, elevation, compression, rest.  Patient unable to wrap ankle herself.  Out a postop walking issues in clinic. Obtain x-ray.  Further recommendations based on imaging.  Supportive care for muscle strain of right upper chest.  Given strict precautions.  Preliminary read on x-ray by this provider with fifth metatarsal fracture.  Radiology read still pending.  Referral to [facility], as pt currently seen  there, placed.  Return if symptoms worsen or fail to improve.   Viola Greulich, MD

## 2023-07-07 NOTE — Patient Instructions (Signed)
 You can use icy hot, biofreeze, aspercreme, etc to rub on your shoulder/upper chest.  You can also put ice or heat to help with the pain from muscle strain. We are getting an x-ray of your foot to make sure you did not get a fracture.  Continue elevating your foot at home using heat, ice, Tylenol  or ibuprofen to help with the discomfort.

## 2023-07-07 NOTE — Telephone Encounter (Signed)
 Copied from CRM [id number]. Topic: General - Other >> Jul 07, 2023  4:29 PM [name] C wrote: Reason for CRM: Patient called in regarding a missed call from the office, would like for them to give her a callback

## 2023-07-07 NOTE — Telephone Encounter (Signed)
 Called patient left a VM to return call, Dr. Arliss Lam has placed a referral to podiatry for a 5th Metatarsal fracture

## 2023-07-07 NOTE — Addendum Note (Signed)
 Addended by: Georga Killings A on: 07/07/2023 04:07 PM   Modules accepted: Orders

## 2023-07-08 ENCOUNTER — Telehealth: Payer: Self-pay

## 2023-07-08 NOTE — Telephone Encounter (Signed)
 See other message for X-Rays

## 2023-07-08 NOTE — Telephone Encounter (Signed)
 Copied from CRM [id number]. Topic: Clinical - Medical Advice >> Jul 08, 2023 11:25 AM Earnestine Goes B wrote: Reason for CRM:pt called to speak to nurse regarding appt at [facility] please call pt back at [phone]

## 2023-07-08 NOTE — Telephone Encounter (Signed)
 I have spoke with the patient and reach out to [facility]. They have placed the patient on the wait list and will contact the patient if any thing becomes available

## 2023-07-08 NOTE — Telephone Encounter (Signed)
 Appt changed to 6/9

## 2023-07-14 ENCOUNTER — Ambulatory Visit (INDEPENDENT_AMBULATORY_CARE_PROVIDER_SITE_OTHER)

## 2023-07-14 ENCOUNTER — Ambulatory Visit: Admitting: Podiatry

## 2023-07-14 ENCOUNTER — Encounter: Payer: Self-pay | Admitting: Podiatry

## 2023-07-14 DIAGNOSIS — S92352A Displaced fracture of fifth metatarsal bone, left foot, initial encounter for closed fracture: Secondary | ICD-10-CM

## 2023-07-14 NOTE — Progress Notes (Signed)
  Subjective:  Patient ID: Nancy Herman, female    DOB: 09/27/1945,   MRN: [id number]  Chief Complaint  Patient presents with   Fracture    Rm21 fx left 5th/1 week/no diabetic/aching and bruised    78 y.o. female presents for new concern of left foot fracture that occurred one week ago. She was at church and fell and had immediate pain in the foot. She was seen by PCP and advised to follow-up here. She relates bruising swelling and soreness when walking on the foot.  Denies any other pedal complaints. Denies n/v/f/c.   Past Medical History:  Diagnosis Date   Anxiety    Frequent headaches    GERD (gastroesophageal reflux disease)    Hyperlipidemia    IBS (irritable bowel syndrome)    Pituitary insufficiency (HCC)    Thyroid  disease     Objective:  Physical Exam: Vascular: DP/PT pulses 2/4 bilateral. CFT <3 seconds. Normal hair growth on digits. No edema.  Skin. No lacerations or abrasions bilateral feet.  Musculoskeletal: MMT 5/5 bilateral lower extremities in DF, PF, Inversion and Eversion. Deceased ROM in DF of ankle joint. Tender to fifth metatarsal distally with edema and eccymossi noted to the foot.  Neurological: Sensation intact to light touch.   Assessment:   1. Closed fracture of base of fifth metatarsal bone of left foot, initial encounter      Plan:  Patient was evaluated and treated and all questions answered. -Xrays reviewed. Fracture noted just proximal to fifth metatarsal neck on left foot mildly displaced non articular fracture. No change in metatarsal arc of foot.  -Discussed treatement options for fifth mettarsal fracture; risks, alternatives, and benefits explained. -Dispensed surgical shoe. Patient to wear at all times and instructed on use -Recommend protection, rest, ice, elevation daily until symptoms improve -Rx pain med/anti-inflammatories as needed -Patient to return to office in 4 weeks for serial x-rays to assess healing  or sooner if  condition worsens.   Jennefer Moats, DPM

## 2023-07-23 ENCOUNTER — Ambulatory Visit: Admitting: Podiatry

## 2023-07-24 ENCOUNTER — Other Ambulatory Visit: Payer: Self-pay | Admitting: Family Medicine

## 2023-07-24 DIAGNOSIS — L821 Other seborrheic keratosis: Secondary | ICD-10-CM | POA: Diagnosis not present

## 2023-07-24 DIAGNOSIS — Z1231 Encounter for screening mammogram for malignant neoplasm of breast: Secondary | ICD-10-CM

## 2023-07-24 DIAGNOSIS — L811 Chloasma: Secondary | ICD-10-CM | POA: Diagnosis not present

## 2023-08-11 ENCOUNTER — Encounter: Payer: Self-pay | Admitting: Podiatry

## 2023-08-11 ENCOUNTER — Ambulatory Visit (INDEPENDENT_AMBULATORY_CARE_PROVIDER_SITE_OTHER)

## 2023-08-11 ENCOUNTER — Ambulatory Visit: Admitting: Podiatry

## 2023-08-11 DIAGNOSIS — S92355D Nondisplaced fracture of fifth metatarsal bone, left foot, subsequent encounter for fracture with routine healing: Secondary | ICD-10-CM | POA: Diagnosis not present

## 2023-08-11 NOTE — Progress Notes (Signed)
  Subjective:  Patient ID: Nancy Herman, female    DOB: 03-Feb-1946,   MRN: [id number]  Chief Complaint  Patient presents with   Fracture    F/U fracture left 5th met. 5 pain. Wearing surgical shoe. Non diabetic.    78 y.o. female presents for follow-up of left fifth metatarsal fracture. Relates doing ok still getting some pain. Has been wearing the surgical shoe.    Denies any other pedal complaints. Denies n/v/f/c.   Past Medical History:  Diagnosis Date   Anxiety    Frequent headaches    GERD (gastroesophageal reflux disease)    Hyperlipidemia    IBS (irritable bowel syndrome)    Pituitary insufficiency (HCC)    Thyroid  disease     Objective:  Physical Exam: Vascular: DP/PT pulses 2/4 bilateral. CFT <3 seconds. Normal hair growth on digits. No edema.  Skin. No lacerations or abrasions bilateral feet.  Musculoskeletal: MMT 5/5 bilateral lower extremities in DF, PF, Inversion and Eversion. Deceased ROM in DF of ankle joint. Tender to fifth metatarsal distally with edema and ecchymosis resolved.  Neurological: Sensation intact to light touch.   Assessment:   1. Closed nondisplaced fracture of fifth metatarsal bone of left foot with routine healing, subsequent encounter      Plan:  Patient was evaluated and treated and all questions answered. -Xrays reviewed. Fracture noted just proximal to fifth metatarsal neck on left foot mildly displaced non articular fracture. Callus formation noted and signs of healing.  No change in metatarsal arc of foot.  -Discussed treatement options for fifth mettarsal fracture; risks, alternatives, and benefits explained. -Continue surgical shoe for  two more weeks  then may begin transition into regular shoe.  -Recommend protection, rest, ice, elevation daily until symptoms improve -Anti-inflammatories as needed -Patient to return to office in 4 weeks for serial x-rays to assess healing  or sooner if condition worsens.   Asberry Failing,  DPM

## 2023-08-25 ENCOUNTER — Ambulatory Visit
Admission: RE | Admit: 2023-08-25 | Discharge: 2023-08-25 | Disposition: A | Discharge status: Home / Self Care | Source: Ambulatory Visit | Attending: Family Medicine | Admitting: Family Medicine

## 2023-08-25 DIAGNOSIS — Z1231 Encounter for screening mammogram for malignant neoplasm of breast: Secondary | ICD-10-CM | POA: Diagnosis not present

## 2023-09-08 ENCOUNTER — Encounter: Payer: Self-pay | Admitting: Podiatry

## 2023-09-08 ENCOUNTER — Ambulatory Visit (INDEPENDENT_AMBULATORY_CARE_PROVIDER_SITE_OTHER)

## 2023-09-08 ENCOUNTER — Ambulatory Visit: Admitting: Podiatry

## 2023-09-08 DIAGNOSIS — S92355D Nondisplaced fracture of fifth metatarsal bone, left foot, subsequent encounter for fracture with routine healing: Secondary | ICD-10-CM | POA: Diagnosis not present

## 2023-09-08 NOTE — Progress Notes (Signed)
  Subjective:  Patient ID: Nancy Herman, female    DOB: 1945-09-05,   MRN: [id number]  Chief Complaint  Patient presents with   Fracture    It's doing good.    78 y.o. female presents for follow-up of left fifth metatarsal fracture. Relates doing well and pain improved.  Has been wearing supportive tennis shoes.    Denies any other pedal complaints. Denies n/v/f/c.   Past Medical History:  Diagnosis Date   Anxiety    Frequent headaches    GERD (gastroesophageal reflux disease)    Hyperlipidemia    IBS (irritable bowel syndrome)    Pituitary insufficiency (HCC)    Thyroid  disease     Objective:  Physical Exam: Vascular: DP/PT pulses 2/4 bilateral. CFT <3 seconds. Normal hair growth on digits. No edema.  Skin. No lacerations or abrasions bilateral feet.  Musculoskeletal: MMT 5/5 bilateral lower extremities in DF, PF, Inversion and Eversion. Deceased ROM in DF of ankle joint. Non tender to fifth metatarsal distally with edema and ecchymosis resolved.  Neurological: Sensation intact to light touch.   Assessment:   1. Closed nondisplaced fracture of fifth metatarsal bone of left foot with routine healing, subsequent encounter      Plan:  Patient was evaluated and treated and all questions answered. -Xrays reviewed. Fracture noted just proximal to fifth metatarsal neck on left foot mildly displaced non articular fracture. Callus formation noted and signs of healing.  No change in metatarsal arc of foot.  -Discussed treatement options for fifth mettarsal fracture; risks, alternatives, and benefits explained. -Continue regular shoes. Appears to be doing very well.  -Patient to return to office as needed  Asberry Failing, DPM

## 2023-09-25 ENCOUNTER — Ambulatory Visit (HOSPITAL_BASED_OUTPATIENT_CLINIC_OR_DEPARTMENT_OTHER): Admitting: Psychiatry

## 2023-09-25 VITALS — BP 127/71 | HR 60 | Ht 63.0 in | Wt 144.0 lb

## 2023-09-25 DIAGNOSIS — F324 Major depressive disorder, single episode, in partial remission: Secondary | ICD-10-CM | POA: Diagnosis not present

## 2023-09-25 DIAGNOSIS — F32A Depression, unspecified: Secondary | ICD-10-CM | POA: Diagnosis not present

## 2023-09-25 DIAGNOSIS — F411 Generalized anxiety disorder: Secondary | ICD-10-CM | POA: Diagnosis not present

## 2023-09-25 MED ORDER — RISPERIDONE 0.5 MG PO TABS: ORAL_TABLET | ORAL | 7 refills | Status: DC

## 2023-09-25 MED ORDER — FLUOXETINE HCL 20 MG PO CAPS: 20.0000 mg | ORAL_CAPSULE | Freq: Every day | ORAL | 7 refills | Status: DC

## 2023-09-25 NOTE — Progress Notes (Signed)
 Psychiatric Initial Adult Assessment   Patient Identification: Nancy Herman MRN:  [id number] Date of Evaluation:  09/25/2023 Referral Source: Dr. Betty Swaziland Chief Complaint:     Visit Diagnosis:    ICD-10-CM   1. Generalized anxiety disorder  F41.1 FLUoxetine  (PROZAC ) 20 MG capsule    2. Depressive disorder  F32.A FLUoxetine  (PROZAC ) 20 MG capsule       History of Present Illness:    Today patient is seen in the office and is doing fairly well.  Her car is in the shop because it is getting repaired.  She is using her rental car.  She also is having a roofer placed as well.  Overall her mood is good.  She is sleeping and eating well.  She has no evidence of psychosis.  Her dog Coby is doing well.  He is eating better.  She takes her medicines as prescribed.  Financially she is stable.  She likes her home.  She continues to live alone.  She has 2 children and 5 grandchildren and 3 great-grandchildren all of them are doing well.    Depression Symptoms:  difficulty concentrating, (Hypo) Manic Symptoms:   Anxiety Symptoms:   Psychotic Symptoms:   PTSD Symptoms:   Past Psychiatric History: Prozac  for many years  Previous Psychotropic Medications:   Substance Abuse History in the last 12 months:  No.  Consequences of Substance Abuse:   Past Medical History:  Past Medical History:  Diagnosis Date   Anxiety    Frequent headaches    GERD (gastroesophageal reflux disease)    Hyperlipidemia    IBS (irritable bowel syndrome)    Pituitary insufficiency (HCC)    Thyroid  disease     Past Surgical History:  Procedure Laterality Date   ABDOMINAL HYSTERECTOMY  1990   ANAL RECTAL MANOMETRY N/A 08/14/2018   Procedure: ANO RECTAL MANOMETRY;  Surgeon: Shila Gustav GAILS, MD;  Location: [facility];  Service: Endoscopy;  Laterality: N/A;   BRAIN SURGERY     BREAST BIOPSY Left 08/23/2022   MM LT BREAST BX W LOC DEV 1ST LESION IMAGE BX SPEC STEREO GUIDE 08/23/2022 [facility]  MAMMOGRAPHY   BREAST EXCISIONAL BIOPSY Right unsure   BREAST EXCISIONAL BIOPSY Left unsure   eyelid surgery Bilateral 08/27/2021   OVARIAN CYST SURGERY  1994   REPAIR RECTOCELE  2016   and prolapsed uterus.   TONSILLECTOMY  1973    Family Psychiatric History:   Family History:  Family History  Problem Relation Age of Onset   Heart disease Mother        No details    Alcohol abuse Mother    Drug abuse Mother    Sickle cell anemia Mother    Heart disease Father        No details . Questionable bypass or stent   Drug abuse Father    Alcohol abuse Father    Colon cancer Maternal Aunt    Esophageal cancer Neg Hx    Pancreatic cancer Neg Hx    Stomach cancer Neg Hx    Breast cancer Neg Hx    BRCA 1/2 Neg Hx     Social History:   Social History   Socioeconomic History   Marital status: Widowed    Spouse name: Not on file   Number of children: 2   Years of education: Not on file   Highest education level: Not on file  Occupational History   Occupation: retired  Tobacco Use   Smoking status: Never  Smokeless tobacco: Never  Vaping Use   Vaping status: Never Used  Substance and Sexual Activity   Alcohol use: No   Drug use: No   Sexual activity: Never  Other Topics Concern   Not on file  Social History Narrative   Lives alone.  Lives near son.  Was in [place].   Social Drivers of Corporate investment banker Strain: Low Risk  (12/02/2022)   Overall Financial Resource Strain (CARDIA)    Difficulty of Paying Living Expenses: Not hard at all  Food Insecurity: No Food Insecurity (12/02/2022)   Hunger Vital Sign    Worried About Running Out of Food in the Last Year: Never true    Ran Out of Food in the Last Year: Never true  Transportation Needs: No Transportation Needs (12/02/2022)   PRAPARE - Administrator, Civil Service (Medical): No    Lack of Transportation (Non-Medical): No  Physical Activity: Inactive (12/02/2022)   Exercise Vital Sign    Days of  Exercise per Week: 0 days    Minutes of Exercise per Session: 0 min  Stress: No Stress Concern Present (12/02/2022)   [employer] of Occupational Health - Occupational Stress Questionnaire    Feeling of Stress : Not at all  Social Connections: Moderately Isolated (11/14/2020)   Social Connection and Isolation Panel    Frequency of Communication with Friends and Family: More than three times a week    Frequency of Social Gatherings with Friends and Family: Once a week    Attends Religious Services: 1 to 4 times per year    Active Member of [employer] or Organizations: No    Attends Banker Meetings: Never    Marital Status: Widowed    Additional Social History:   Allergies:   Allergies  Allergen Reactions   Other Other (See Comments)    Seasonal allergies   Gramineae Pollens Other (See Comments)    Sneezing, runny nose, watery eyes    Metabolic Disorder Labs: Lab Results  Component Value Date   HGBA1C 6.0 11/11/2022   Lab Results  Component Value Date   PROLACTIN 15.8 11/13/2022   PROLACTIN 21.4 02/28/2020   Lab Results  Component Value Date   CHOL 186 11/11/2022   TRIG 58.0 11/11/2022   HDL 87.60 11/11/2022   CHOLHDL 2 11/11/2022   VLDL 11.6 11/11/2022   LDLCALC 87 11/11/2022   LDLCALC 97 02/14/2022   Lab Results  Component Value Date   TSH 2.09 11/13/2022    Therapeutic Level Labs: No results found for: LITHIUM No results found for: CBMZ No results found for: VALPROATE  Current Medications: Current Outpatient Medications  Medication Sig Dispense Refill   carbamide peroxide (DEBROX) 6.5 % OTIC solution Place 5 drops into the left ear 2 (two) times daily. 15 mL 0   cetirizine (ZYRTEC) 10 MG tablet Take 10 mg by mouth daily.     fluconazole  (DIFLUCAN ) 100 MG tablet Take 1 tablet (100 mg total) by mouth daily. 14 tablet 0   FLUoxetine  (PROZAC ) 20 MG capsule Take 1 capsule (20 mg total) by mouth daily. 30 capsule 7   linaclotide  (LINZESS )  290 MCG CAPS capsule Take 290 mcg by mouth daily before breakfast.     montelukast  (SINGULAIR ) 10 MG tablet TAKE 1 TABLET BY MOUTH AT BEDTIME 90 tablet 2   pantoprazole  (PROTONIX ) 40 MG tablet Take 1 tablet (40 mg total) by mouth daily. 90 tablet 3   polyethylene glycol (MIRALAX ) packet Take  17 g by mouth daily. 14 each 0   risperiDONE  (RISPERDAL ) 0.5 MG tablet 2 qhs 60 tablet 7   rosuvastatin  (CRESTOR ) 20 MG tablet Take 1 tablet (20 mg total) by mouth daily. 90 tablet 3   TURMERIC PO Take by mouth.     No current facility-administered medications for this visit.    Musculoskeletal: Strength & Muscle Tone: within normal limits Gait & Station: normal Patient leans:   Psychiatric Specialty Exam: ROS  Blood pressure 127/71, pulse 60, height 5' 3 (1.6 m), weight 144 lb (65.3 kg), SpO2 100%.Body mass index is 25.51 kg/m.  General Appearance: Casual  Eye Contact:  Good  Speech: Good  Volume:  Normal  Mood:  NA  Affect:  NA and Appropriate  Thought Process:  Coherent  Orientation:  Full (Time, Place, and Person)  Thought Content:  Logical  Suicidal Thoughts:  No  Homicidal Thoughts:  No  Memory:  Negative  Judgement:  Good  Insight:  Fair  Psychomotor Activity:  Normal  Concentration:    Recall:  Good  Fund of Knowledge:Good  Language: Good  Akathisia:  No  Handed:    AIMS (if indicated):  not done  Assets:  Desire for Improvement  ADL's:  Intact  Cognition: WNL  Sleep:  Good   Screenings: GAD-7    Flowsheet Row Office Visit from 07/29/2022 in [facility] [place]  Total GAD-7 Score 2   PHQ2-9    Flowsheet Row Clinical Support from 12/02/2022 in [facility] [place] Office Visit from 11/11/2022 in [facility] [place] Office Visit from 07/29/2022 in [facility] [place] Clinical Support from 11/27/2021 in [facility] [place] Office Visit from 11/09/2021  in [facility] [place]  PHQ-2 Total Score 0 0 1 0 0  PHQ-9 Total Score 0 0 5 -- 1   Flowsheet Row ED from 03/16/2018 in [facility] at [facility] ED from 02/22/2018 in [facility] Department at [facility]  C-SSRS RISK CATEGORY No Risk No Risk    Assessment and Plan:  8/21/20252:06 PM   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  He is doing very well on Prozac  and Risperdal  0.5 mg 2 at night.  This is the lowest effective dose for Risperdal .  She shows no evidence of tardive dyskinesia.  The patient is very stable at this time and will return in 5 months.

## 2023-10-07 ENCOUNTER — Ambulatory Visit (HOSPITAL_COMMUNITY): Admitting: Psychiatry

## 2023-11-07 ENCOUNTER — Other Ambulatory Visit: Payer: Self-pay | Admitting: Family Medicine

## 2023-11-11 ENCOUNTER — Ambulatory Visit: Payer: Medicare HMO | Admitting: Family Medicine

## 2023-11-11 ENCOUNTER — Encounter: Payer: Self-pay | Admitting: Family Medicine

## 2023-11-11 VITALS — BP 122/68 | HR 78 | Temp 97.9°F | Resp 16 | Ht 63.0 in | Wt 144.2 lb

## 2023-11-11 DIAGNOSIS — E785 Hyperlipidemia, unspecified: Secondary | ICD-10-CM | POA: Diagnosis not present

## 2023-11-11 DIAGNOSIS — Z0000 Encounter for general adult medical examination without abnormal findings: Secondary | ICD-10-CM | POA: Diagnosis not present

## 2023-11-11 DIAGNOSIS — N1831 Chronic kidney disease, stage 3a: Secondary | ICD-10-CM

## 2023-11-11 DIAGNOSIS — Z86018 Personal history of other benign neoplasm: Secondary | ICD-10-CM

## 2023-11-11 DIAGNOSIS — R7303 Prediabetes: Secondary | ICD-10-CM | POA: Diagnosis not present

## 2023-11-11 DIAGNOSIS — Z23 Encounter for immunization: Secondary | ICD-10-CM | POA: Diagnosis not present

## 2023-11-11 LAB — MICROALBUMIN / CREATININE URINE RATIO
Creatinine,U: 26 mg/dL
Microalb Creat Ratio: UNDETERMINED mg/g (ref 0.0–30.0)
Microalb, Ur: <0.7 mg/dL

## 2023-11-11 LAB — BASIC METABOLIC PANEL WITH GFR
BUN: 13 mg/dL (ref 6–23)
CO2: 27 meq/L (ref 19–32)
Calcium: 9.8 mg/dL (ref 8.4–10.5)
Chloride: 106 meq/L (ref 96–112)
Creatinine, Ser: 1.05 mg/dL (ref 0.40–1.20)
GFR: 51.71 mL/min — ABNORMAL LOW (ref >60.00)
Glucose, Bld: 86 mg/dL (ref 70–99)
Potassium: 3.4 meq/L — ABNORMAL LOW (ref 3.5–5.1)
Sodium: 142 meq/L (ref 135–145)

## 2023-11-11 LAB — LIPID PANEL
Cholesterol: 180 mg/dL (ref 0–200)
HDL: 77.9 mg/dL (ref >39.00)
LDL Cholesterol: 91 mg/dL (ref 0–99)
NonHDL: 102.13
Total CHOL/HDL Ratio: 2
Triglycerides: 55 mg/dL (ref 0.0–149.0)
VLDL: 11 mg/dL (ref 0.0–40.0)

## 2023-11-11 LAB — CBC
HCT: 33 % — ABNORMAL LOW (ref 36.0–46.0)
Hemoglobin: 11.1 g/dL — ABNORMAL LOW (ref 12.0–15.0)
MCHC: 33.5 g/dL (ref 30.0–36.0)
MCV: 88.1 fl (ref 78.0–100.0)
Platelets: 163 K/uL (ref 150.0–400.0)
RBC: 3.75 Mil/uL — ABNORMAL LOW (ref 3.87–5.11)
RDW: 14.5 % (ref 11.5–15.5)
WBC: 5.5 K/uL (ref 4.0–10.5)

## 2023-11-11 LAB — VITAMIN D 25 HYDROXY (VIT D DEFICIENCY, FRACTURES): VITD: 53.02 ng/mL (ref 30.00–100.00)

## 2023-11-11 LAB — HEMOGLOBIN A1C: Hgb A1c MFr Bld: 6 % (ref 4.6–6.5)

## 2023-11-11 NOTE — Patient Instructions (Addendum)
 A few things to remember from today's visit:  Routine general medical examination at a health care facility  Prediabetes - Plan: Hemoglobin A1c  Hyperlipidemia, unspecified hyperlipidemia type - Plan: Lipid panel  Need for immunization against influenza - Plan: Flu vaccine HIGH DOSE PF(Fluzone Trivalent)  Stage 3a chronic kidney disease (HCC) - Plan: Basic metabolic panel with GFR, Microalbumin / creatinine urine ratio, VITAMIN D  25 Hydroxy (Vit-D Deficiency, Fractures)  As far as labs are stable we can continue annual follow ups.  If you need refills for medications you take chronically, please call your pharmacy. Do not use My Chart to request refills or for acute issues that need immediate attention. If you send a my chart message, it may take a few days to be addressed, specially if I am not in the office.  Please be sure medication list is accurate. If a new problem present, please set up appointment sooner than planned today.

## 2023-11-11 NOTE — Progress Notes (Signed)
 "  Chief Complaint  Patient presents with   Annual Exam   Discussed the use of AI scribe software for clinical note transcription with the patient, who gave verbal consent to proceed.  History of Present Illness Nancy Herman is a 78 year old female with a PMHx significant for GERD, depression, anxiety, prediabetes, HLD, OA, IBS constipation, and allergies, who is here today for her routine physical. Last CPE 11/11/2022.  Last seen on 01/24/23. Since then she has seen podiatrist for 5th metatarsal fracture left foot.  She maintains an active lifestyle through household chores but does not engage in regular exercise or yard work. Her diet is primarily home-cooked meals, though she consumes vegetables less frequently than recommended. She sleeps approximately six to eight hours per night.  She abstains from alcohol and has never smoked.  She has periodic eye and dental examinations. She lives independently, drives herself, and attends church online more frequently than in person, aiming to attend at least once a month.  IBS-C and GERD: She regularly consults a gastroenterologist, though she does not recall the last visit date. She takes Nexium  40 mg daily. Depression and anxiety: She sees a psychiatrist every three months.  Her current medications include rosuvastatin  20 mg daily for cholesterol management and Singulair  daily for allergies.  In June, she experienced a fall down some stairs, resulting in a foot fracture, and consulted a podiatrist on June 9th. She has a history of falls, including one at church while carrying items in both hands.  Her A1c levels have been mildly elevated, no hx of diabetes   Immunization History  Administered Date(s) Administered   Fluad Quad(high Dose 65+) 11/09/2018, 11/09/2019, 11/08/2020, 11/09/2021   Fluad Trivalent(High Dose 65+) 11/11/2022   INFLUENZA, HIGH DOSE SEASONAL PF 11/06/2015, 10/24/2016, 12/03/2017   PFIZER(Purple  Top)SARS-COV-2 Vaccination 02/26/2019, 03/19/2019, 12/04/2019   PNEUMOCOCCAL CONJUGATE-20 12/16/2022   Pneumococcal Conjugate-13 09/04/2013   Pneumococcal Polysaccharide-23 08/04/2012, 05/10/2019   RSV,unspecified 04/05/2022   Tdap 12/16/2022   Zoster Recombinant(Shingrix) 04/04/2017, 06/06/2017, 07/05/2017, 08/13/2017   Health Maintenance  Topic Date Due   Influenza Vaccine  09/05/2023   COVID-19 Vaccine (8 - 2024-25 season) 10/06/2023   Medicare Annual Wellness (AWV)  12/02/2023   Colonoscopy  09/03/2025   DTaP/Tdap/Td (2 - Td or Tdap) 12/15/2032   Pneumococcal Vaccine: 50+ Years  Completed   DEXA SCAN  Completed   Hepatitis C Screening  Completed   Zoster Vaccines- Shingrix  Completed   Meningococcal B Vaccine  Aged Out   Mammogram  Discontinued   Hyperlipidemia:Currently on rosuvastatin  20 mg daily.   Lab Results  Component Value Date   CHOL 186 11/11/2022   HDL 87.60 11/11/2022   LDLCALC 87 11/11/2022   TRIG 58.0 11/11/2022   CHOLHDL 2 11/11/2022   CKD III: Negative for gross hematuria, foamy urine, or decreased urine output.  Lab Results  Component Value Date   NA 143 01/24/2023   CL 105 01/24/2023   K 3.6 01/24/2023   CO2 30 01/24/2023   BUN 13 01/24/2023   CREATININE 1.09 01/24/2023   GFR 49.72 (L) 01/24/2023   CALCIUM  9.4 01/24/2023   ALBUMIN 4.2 01/24/2023   GLUCOSE 95 01/24/2023   Iron def anemia:She is taking iron supplements with vitamin C every other day.  No blood in stool or melena.  Lab Results  Component Value Date   WBC 5.6 01/24/2023   HGB 11.4 (L) 01/24/2023   HCT 34.0 (L) 01/24/2023   MCV 88.6 01/24/2023  PLT 179.0 01/24/2023   She is taking vitamin D  and calcium  supplements. Lab Results  Component Value Date   VD25OH 49.22 11/11/2022   Hx of pituitary adenoma:S/P transsphenoidal surgery in 2017. Normal pituitary hormones. Follows annually with endocrinologist.  Review of Systems  Constitutional:  Negative for activity change,  appetite change and fever.  HENT:  Negative for mouth sores, sore throat and trouble swallowing.   Eyes:  Negative for redness and visual disturbance.  Respiratory:  Negative for cough, shortness of breath and wheezing.   Cardiovascular:  Negative for chest pain and leg swelling.  Gastrointestinal:  Negative for abdominal pain, nausea and vomiting.  Endocrine: Negative for cold intolerance, heat intolerance, polydipsia, polyphagia and polyuria.  Genitourinary:  Negative for decreased urine volume, dysuria and hematuria.  Musculoskeletal:  Negative for gait problem and myalgias.  Skin:  Negative for color change and rash.  Allergic/Immunologic: Positive for environmental allergies.  Neurological:  Negative for syncope, weakness and headaches.  Hematological:  Negative for adenopathy. Does not bruise/bleed easily.  Psychiatric/Behavioral:  Negative for confusion and hallucinations.   All other systems reviewed and are negative.  Current Outpatient Medications on File Prior to Visit  Medication Sig Dispense Refill   carbamide peroxide (DEBROX) 6.5 % OTIC solution Place 5 drops into the left ear 2 (two) times daily. 15 mL 0   cetirizine (ZYRTEC) 10 MG tablet Take 10 mg by mouth daily.     fluconazole  (DIFLUCAN ) 100 MG tablet Take 1 tablet (100 mg total) by mouth daily. 14 tablet 0   FLUoxetine  (PROZAC ) 20 MG capsule Take 1 capsule (20 mg total) by mouth daily. 30 capsule 7   linaclotide  (LINZESS ) 290 MCG CAPS capsule Take 290 mcg by mouth daily before breakfast.     montelukast  (SINGULAIR ) 10 MG tablet TAKE 1 TABLET BY MOUTH AT BEDTIME 90 tablet 2   pantoprazole  (PROTONIX ) 40 MG tablet Take 1 tablet (40 mg total) by mouth daily. 90 tablet 3   polyethylene glycol (MIRALAX ) packet Take 17 g by mouth daily. 14 each 0   risperiDONE  (RISPERDAL ) 0.5 MG tablet 2 qhs 60 tablet 7   rosuvastatin  (CRESTOR ) 20 MG tablet Take 1 tablet by mouth once daily 90 tablet 0   TURMERIC PO Take by mouth.     No  current facility-administered medications on file prior to visit.   Past Medical History:  Diagnosis Date   Anxiety    Frequent headaches    GERD (gastroesophageal reflux disease)    Hyperlipidemia    IBS (irritable bowel syndrome)    Pituitary insufficiency    Thyroid  disease    Past Surgical History:  Procedure Laterality Date   ABDOMINAL HYSTERECTOMY  1990   ANAL RECTAL MANOMETRY N/A 08/14/2018   Procedure: ANO RECTAL MANOMETRY;  Surgeon: Nandigam, Kavitha V, MD;  Location: [facility];  Service: Endoscopy;  Laterality: N/A;   BRAIN SURGERY     BREAST BIOPSY Left 08/23/2022   MM LT BREAST BX W LOC DEV 1ST LESION IMAGE BX SPEC STEREO GUIDE 08/23/2022 [facility]   BREAST EXCISIONAL BIOPSY Right unsure   BREAST EXCISIONAL BIOPSY Left unsure   eyelid surgery Bilateral 08/27/2021   OVARIAN CYST SURGERY  1994   REPAIR RECTOCELE  2016   and prolapsed uterus.   TONSILLECTOMY  1973   Allergies  Allergen Reactions   Other Other (See Comments)    Seasonal allergies   Gramineae Pollens Other (See Comments)    Sneezing, runny nose, watery eyes  Family History  Problem Relation Age of Onset   Heart disease Mother        No details    Alcohol abuse Mother    Drug abuse Mother    Sickle cell anemia Mother    Heart disease Father        No details . Questionable bypass or stent   Drug abuse Father    Alcohol abuse Father    Colon cancer Maternal Aunt    Esophageal cancer Neg Hx    Pancreatic cancer Neg Hx    Stomach cancer Neg Hx    Breast cancer Neg Hx    BRCA 1/2 Neg Hx     Social History   Socioeconomic History   Marital status: Widowed    Spouse name: Not on file   Number of children: 2   Years of education: Not on file   Highest education level: Not on file  Occupational History   Occupation: retired  Tobacco Use   Smoking status: Never   Smokeless tobacco: Never  Vaping Use   Vaping status: Never Used  Substance and Sexual Activity   Alcohol use:  No   Drug use: No   Sexual activity: Never  Other Topics Concern   Not on file  Social History Narrative   Lives alone.  Lives near son.  Was in [place].   Social Drivers of Corporate Investment Banker Strain: Low Risk  (12/02/2022)   Overall Financial Resource Strain (CARDIA)    Difficulty of Paying Living Expenses: Not hard at all  Food Insecurity: No Food Insecurity (12/02/2022)   Hunger Vital Sign    Worried About Running Out of Food in the Last Year: Never true    Ran Out of Food in the Last Year: Never true  Transportation Needs: No Transportation Needs (12/02/2022)   PRAPARE - Administrator, Civil Service (Medical): No    Lack of Transportation (Non-Medical): No  Physical Activity: Inactive (12/02/2022)   Exercise Vital Sign    Days of Exercise per Week: 0 days    Minutes of Exercise per Session: 0 min  Stress: No Stress Concern Present (12/02/2022)   [employer] of Occupational Health - Occupational Stress Questionnaire    Feeling of Stress : Not at all  Social Connections: Moderately Isolated (11/14/2020)   Social Connection and Isolation Panel    Frequency of Communication with Friends and Family: More than three times a week    Frequency of Social Gatherings with Friends and Family: Once a week    Attends Religious Services: 1 to 4 times per year    Active Member of [employer] or Organizations: No    Attends Banker Meetings: Never    Marital Status: Widowed   Vitals:   11/11/23 0933  BP: 122/68  Pulse: 78  Resp: 16  Temp: 97.9 F (36.6 C)  SpO2: 96%   Body mass index is 25.54 kg/m.  Wt Readings from Last 3 Encounters:  11/11/23 144 lb 3.2 oz (65.4 kg)  07/07/23 146 lb 6.4 oz (66.4 kg)  02/20/23 143 lb (64.9 kg)   Physical Exam Vitals and nursing note reviewed.  Constitutional:      General: She is not in acute distress.    Appearance: She is well-developed.  HENT:     Head: Normocephalic and atraumatic.     Right Ear:  Tympanic membrane, ear canal and external ear normal.     Left Ear: External ear normal.  Ears:     Comments: Left ear canal with excess cerumen, could not see TM.    Mouth/Throat:     Mouth: Mucous membranes are moist.     Pharynx: Oropharynx is clear. Uvula midline.  Eyes:     Conjunctiva/sclera: Conjunctivae normal.     Pupils: Pupils are equal, round, and reactive to light.  Neck:     Thyroid : No thyroid  mass.  Cardiovascular:     Rate and Rhythm: Normal rate and regular rhythm.     Pulses:          Dorsalis pedis pulses are 2+ on the right side and 2+ on the left side.     Heart sounds: No murmur heard. Pulmonary:     Effort: Pulmonary effort is normal. No respiratory distress.     Breath sounds: Normal breath sounds.  Abdominal:     Palpations: Abdomen is soft. There is no hepatomegaly or mass.     Tenderness: There is no abdominal tenderness.  Genitourinary:    Comments: No concerns. Musculoskeletal:     Right lower leg: No edema.     Left lower leg: No edema.     Comments: No signs of synovitis appreciated.  Lymphadenopathy:     Cervical: No cervical adenopathy.  Skin:    General: Skin is warm.     Findings: No erythema or rash.  Neurological:     General: No focal deficit present.     Mental Status: She is alert and oriented to person, place, and time.     Cranial Nerves: No cranial nerve deficit.     Coordination: Coordination normal.     Gait: Gait normal.     Deep Tendon Reflexes:     Reflex Scores:      Bicep reflexes are 2+ on the right side and 2+ on the left side.      Patellar reflexes are 2+ on the right side and 2+ on the left side. Psychiatric:        Mood and Affect: Mood and affect normal.   ASSESSMENT AND PLAN:  Nancy Herman.  Orders Placed This Encounter  Procedures   Flu vaccine HIGH DOSE PF(Fluzone Trivalent)   Basic metabolic panel with GFR   Microalbumin / creatinine  urine ratio   VITAMIN D  25 Hydroxy (Vit-D Deficiency, Fractures)   Hemoglobin A1c   Lipid panel   CBC   Lab Results  Component Value Date   WBC 5.5 11/11/2023   HGB 11.1 (L) 11/11/2023   HCT 33.0 (L) 11/11/2023   MCV 88.1 11/11/2023   PLT 163.0 11/11/2023   Lab Results  Component Value Date   NA 142 11/11/2023   CL 106 11/11/2023   K 3.4 (L) 11/11/2023   CO2 27 11/11/2023   BUN 13 11/11/2023   CREATININE 1.05 11/11/2023   GFR 51.71 (L) 11/11/2023   CALCIUM  9.8 11/11/2023   ALBUMIN 4.2 01/24/2023   GLUCOSE 86 11/11/2023   Lab Results  Component Value Date   CHOL 180 11/11/2023   HDL 77.90 11/11/2023   LDLCALC 91 11/11/2023   TRIG 55.0 11/11/2023   CHOLHDL 2 11/11/2023   Lab Results  Component Value Date   HGBA1C 6.0 11/11/2023   Lab Results  Component Value Date   VD25OH 53.02 11/11/2023   Lab Results  Component Value Date   MICROALBUR <0.7 11/11/2023   Routine general medical Herman at a health care facility Assessment &  Plan: We discussed the importance of regular physical activity and healthy diet for prevention of chronic illness and/or complications. Preventive guidelines reviewed. Vaccination up to date. Ca++ and vit D supplementation to continue. Fall precautions. Next CPE in a year.   Prediabetes Assessment & Plan: Last HgA1C was 6.0 in 11/2022. Encourage consistency with a healthy lifestyle for diabetes prevention.  Orders: -     Hemoglobin A1c; Future  Hyperlipidemia, unspecified hyperlipidemia type Assessment & Plan: Continue rosuvastatin  20 mg daily and low-fat diet. Further recommendation will be given according to lipid panel result.  Orders: -     Lipid panel; Future  Stage 3a chronic kidney disease (HCC) Assessment & Plan: Stable. Continue adequate hydration, low-salt diet, and avoidance of NSAIDs.  Orders: -     Basic metabolic panel with GFR; Future -     Microalbumin / creatinine urine ratio; Future -     VITAMIN  D 25 Hydroxy (Vit-D Deficiency, Fractures); Future -     CBC; Future  History of pituitary adenoma Assessment & Plan: S/P transsphenoidal surgery in 2017. Normal pituitary hormones. Follows annually with endocrinologist.   Need for immunization against influenza -     Flu vaccine HIGH DOSE PF(Fluzone Trivalent)   Young Mulvey G. Manasa Spease, MD  [facility]. [place] office. "

## 2023-11-13 ENCOUNTER — Ambulatory Visit: Payer: Medicare HMO | Admitting: Internal Medicine

## 2023-11-13 ENCOUNTER — Ambulatory Visit: Payer: Self-pay | Admitting: Family Medicine

## 2023-11-13 ENCOUNTER — Encounter: Payer: Self-pay | Admitting: Internal Medicine

## 2023-11-13 VITALS — BP 122/80 | HR 87 | Ht 63.0 in | Wt 144.6 lb

## 2023-11-13 DIAGNOSIS — E893 Postprocedural hypopituitarism: Secondary | ICD-10-CM

## 2023-11-13 NOTE — Addendum Note (Signed)
 Addended by: SWAZILAND, Larnell Granlund G on: 11/13/2023 01:00 PM   Modules accepted: Level of Service

## 2023-11-13 NOTE — Assessment & Plan Note (Signed)
 Last HgA1C was 6.0 in 11/2022. Encourage consistency with a healthy lifestyle for diabetes prevention.

## 2023-11-13 NOTE — Progress Notes (Unsigned)
 Name: Nancy Herman  MRN/ DOB: [id number], 1945-05-18    Age/ Sex: 78 y.o., female    PCP: [place], Betty G, MD   Reason for Endocrinology Evaluation: Pituitary adenoma      Date of Initial Endocrinology Evaluation: 08/20/2016    HPI: Ms. Nancy Herman is a 78 y.o. female with a past medical history of IBS, Hx of pituitary adenoma, dyslipidemia, anxiety d/o . The patient presented for initial endocrinology clinic visit on 08/20/2016 for consultative assistance with her Pituitary adenoma .   The patient has a history of transsphenoidal surgery in 2017 , while living in [place] .  This was nonsecretory with a dimension of 1.8 cm in diameter.  She did not receive any XRT. She was on LT-4 replacement following her surgery until early 2020.  She was seen by Dr. Kassie from 2018 until 02/2020  She had normal pituitary hormones in 2022 as below FSH/LH: normal (not menopausal).   Prol: normal ACTH : stim test normal GH: IGF-1 was normal  TSH: euthyroid off synthroid  (since 2020)  SUBJECTIVE:    Today (11/13/23):  Ms. Verrill is here for a follow up on Hx of pituitary adenoma , S/P transphenoidal resection in 2017.   Patient follows with behavioral health Patient has been following up with podiatry for left fifth metatarsal fracture Weight has been stable over the past year No recent headaches  Has been using reading glasses for small print, up to date with eye exam 04/2023 No palpitations  No tremors  Continues with chronic constipation, uses Linzess   Nocturia 1-2  No polydipsia  No galactorrhea  Has no swelling of hands and feet    HISTORY:  Past Medical History:  Past Medical History:  Diagnosis Date   Anxiety    Frequent headaches    GERD (gastroesophageal reflux disease)    Hyperlipidemia    IBS (irritable bowel syndrome)    Pituitary insufficiency    Thyroid  disease    Past Surgical History:  Past Surgical History:  Procedure Laterality Date    ABDOMINAL HYSTERECTOMY  1990   ANAL RECTAL MANOMETRY N/A 08/14/2018   Procedure: ANO RECTAL MANOMETRY;  Surgeon: Shila Gustav GAILS, MD;  Location: [facility];  Service: Endoscopy;  Laterality: N/A;   BRAIN SURGERY     BREAST BIOPSY Left 08/23/2022   MM LT BREAST BX W LOC DEV 1ST LESION IMAGE BX SPEC STEREO GUIDE 08/23/2022 [facility]MO[facility]   BREAST EXCISIONAL BIOPSY Right unsure   BREAST EXCISIONAL BIOPSY Left unsure   eyelid surgery Bilateral 08/27/2021   OVARIAN CYST SURGERY  1994   REPAIR RECTOCELE  2016   and prolapsed uterus.   TONSILLECTOMY  1973    Social History:  reports that she has never smoked. She has never used smokeless tobacco. She reports that she does not drink alcohol and does not use drugs. Family History: family history includes Alcohol abuse in her father and mother; Colon cancer in her maternal aunt; Drug abuse in her father and mother; Heart disease in her father and mother; Sickle cell anemia in her mother.   HOME MEDICATIONS: Allergies as of 11/13/2023       Reactions   Other Other (See Comments)   Seasonal allergies   Gramineae Pollens Other (See Comments)   Sneezing, runny nose, watery eyes        Medication List        Accurate as of November 13, 2023 10:20 AM. If you have any questions, ask your  nurse or doctor.          cetirizine 10 MG tablet Commonly known as: ZYRTEC Take 10 mg by mouth daily.   Debrox 6.5 % OTIC solution Generic drug: carbamide peroxide Place 5 drops into the left ear 2 (two) times daily.   fluconazole  100 MG tablet Commonly known as: Diflucan  Take 1 tablet (100 mg total) by mouth daily.   FLUoxetine  20 MG capsule Commonly known as: PROzac  Take 1 capsule (20 mg total) by mouth daily.   Linzess  290 MCG Caps capsule Generic drug: linaclotide  Take 290 mcg by mouth daily before breakfast.   montelukast  10 MG tablet Commonly known as: SINGULAIR  TAKE 1 TABLET BY MOUTH AT BEDTIME   pantoprazole  40 MG  tablet Commonly known as: PROTONIX  Take 1 tablet (40 mg total) by mouth daily.   polyethylene glycol 17 g packet Commonly known as: MiraLax  Take 17 g by mouth daily.   risperiDONE  0.5 MG tablet Commonly known as: RISPERDAL  2 qhs   rosuvastatin  20 MG tablet Commonly known as: CRESTOR  Take 1 tablet by mouth once daily   TURMERIC PO Take by mouth.          REVIEW OF SYSTEMS: A comprehensive ROS was conducted with the patient and is negative except as per HPI     OBJECTIVE:  VS: BP 122/80 (BP Location: Left Arm, Patient Position: Sitting, Cuff Size: Large)   Pulse 87   Ht 5' 3 (1.6 m)   Wt 144 lb 9.6 oz (65.6 kg)   LMP  (LMP Unknown)   SpO2 99%   BMI 25.61 kg/m    Wt Readings from Last 3 Encounters:  11/13/23 144 lb 9.6 oz (65.6 kg)  11/11/23 144 lb 3.2 oz (65.4 kg)  07/07/23 146 lb 6.4 oz (66.4 kg)     EXAM: General: Pt appears well and is in NAD  Neck: General: Supple without adenopathy. Thyroid : Thyroid  size normal.  No goiter or nodules appreciated.   Lungs: Clear with good BS bilat   Heart: Auscultation: RRR.  Abdomen: Soft, nontender  Extremities:  BL LE: No pretibial edema   Mental Status: Judgment, insight: Intact Orientation: Oriented to time, place, and person Mood and affect: No depression, anxiety, or agitation     DATA REVIEWED:     Latest Reference Range & Units 11/13/22 09:53  Cortisol, Plasma ug/dL 87.3  Prolactin ng/mL 84.1  Glucose 70 - 99 mg/dL 91  TSH 9.64 - 4.49 uIU/mL 2.09  T4,Free(Direct) 0.60 - 1.60 ng/dL 9.09      MRI Brain 7/87/7977  Brain: Diffusion imaging does not show any acute or subacute infarction. Chronic small-vessel ischemic changes affect the pons. No focal cerebellar insult. Cerebral hemispheres show moderate chronic small-vessel ischemic changes of the white matter, similar to the study of 2 years ago. No cortical or large vessel territory infarction. No intra-axial mass lesion, hemorrhage,  hydrocephalus or extra-axial collection. Previous trans-sphenoidal resection of a pituitary mass. Residual pituitary tissue has an unremarkable appearance. No evidence recurrent pituitary tumor.   Vascular: Major vessels at the base of the brain show flow.   Skull and upper cervical spine: Negative   Sinuses/Orbits: Clear/normal   Other: None   IMPRESSION: 1. No change since 2 years ago. Moderate chronic small-vessel ischemic changes of the pons and cerebral hemispheric white matter. No acute or subacute finding. 2. Previous trans-sphenoidal resection of a pituitary mass. Residual pituitary tissue has an unremarkable appearance.     Old records , labs and images  have been reviewed.    ASSESSMENT/PLAN/RECOMMENDATIONS:   S/P Transphenoidal hypophysectomy :    -Patient with no local symptoms -There has been no clinical evidence of hypo or hyperpituitarism -Last MRI in 2022 shows stability which was at the 5 mark postoperative -The patient is 8 years post hypophysectomy, over the past 7 years her pituitary hormones have been within normal range -Historically she has had normal pituitary hormones including normal cosyntropin  stimulation test - I did explain to the patient that if her pituitary hormones come back normal, there is no need for further endocrinology follow-up as the patient has completed more than 5 years postoperative follow-up   Follow-up as needed   Signed electronically by: Stefano Redgie Butts, MD  [facility] Endocrinology  [facility] [address] [place] [place], [place] [address] Phone: [phone] FAX: [phone]   CC: [place], Betty G, MD [address] [place] [address] Phone: [phone] Fax: [phone]   Return to Endocrinology clinic as below: Future Appointments  Date Time Provider Department Center  12/05/2023  9:20 AM [facility]ANNUAL WELLNESS VISIT [facility] [address]  02/17/2024  1:30 PM Tasia Lung, MD [facility] None  11/12/2024 10:00 AM [place], Betty G, MD [facility] [address]

## 2023-11-13 NOTE — Assessment & Plan Note (Signed)
 S/P transsphenoidal surgery in 2017. Normal pituitary hormones. Follows annually with endocrinologist.

## 2023-11-13 NOTE — Assessment & Plan Note (Signed)
 Continue rosuvastatin 20 mg daily and low-fat diet. Further recommendation will be given according to lipid panel result.

## 2023-11-13 NOTE — Assessment & Plan Note (Signed)
 We discussed the importance of regular physical activity and healthy diet for prevention of chronic illness and/or complications. Preventive guidelines reviewed. Vaccination up to date. Ca++ and vit D supplementation to continue. Fall precautions. Next CPE in a year.

## 2023-11-13 NOTE — Assessment & Plan Note (Signed)
 Stable. Continue adequate hydration, low-salt diet, and avoidance of NSAIDs.

## 2023-11-14 ENCOUNTER — Ambulatory Visit: Payer: Self-pay | Admitting: Internal Medicine

## 2023-11-18 LAB — BASIC METABOLIC PANEL WITH GFR
BUN/Creatinine Ratio: 8 (calc) (ref 6–22)
BUN: 10 mg/dL (ref 7–25)
CO2: 31 mmol/L (ref 20–32)
Calcium: 9.5 mg/dL (ref 8.6–10.4)
Chloride: 106 mmol/L (ref 98–110)
Creat: 1.18 mg/dL — ABNORMAL HIGH (ref 0.60–1.00)
Glucose, Bld: 87 mg/dL (ref 65–99)
Potassium: 3.8 mmol/L (ref 3.5–5.3)
Sodium: 143 mmol/L (ref 135–146)
eGFR: 48 mL/min/1.73m2 — ABNORMAL LOW (ref >=60)

## 2023-11-18 LAB — INSULIN-LIKE GROWTH FACTOR
IGF-I, LC/MS: 100 ng/mL (ref 34–245)
Z-Score (Female): 0 {STDV} (ref ?–2.0)

## 2023-11-18 LAB — PROLACTIN: Prolactin: 18.8 ng/mL

## 2023-11-18 LAB — T4, FREE: Free T4: 1.2 ng/dL (ref 0.8–1.8)

## 2023-11-18 LAB — TSH: TSH: 2.12 m[IU]/L (ref 0.40–4.50)

## 2023-11-18 LAB — CORTISOL: Cortisol, Plasma: 13.9 ug/dL

## 2023-11-18 LAB — ACTH: C206 ACTH: 13 pg/mL (ref 6–50)

## 2023-12-18 ENCOUNTER — Encounter: Payer: Self-pay | Admitting: Family Medicine

## 2023-12-18 ENCOUNTER — Ambulatory Visit: Payer: Self-pay

## 2023-12-18 ENCOUNTER — Ambulatory Visit: Admitting: Family Medicine

## 2023-12-18 DIAGNOSIS — Z0000 Encounter for general adult medical examination without abnormal findings: Secondary | ICD-10-CM

## 2023-12-18 NOTE — Progress Notes (Signed)
 To check-in Nursing staff: Please review Meds, Allergies, PMH, and care teams and update Please request wt, home BP, etc and update vitals if able Please complete the following flowsheets under the Medicare Visits Tab:  Medicare Wellness  Stress  PHQ-2-9  Exercise  Social Connections  Method of visit: Not In Person ----------------------------------------------------------------------------------------------------------------------------------------------------------------------------------------------------------------------  Because this visit was a virtual/telehealth visit, some criteria may be missing or patient reported. Any vitals not documented were not able to be obtained and vitals that have been documented are patient reported.    MEDICARE ANNUAL PREVENTIVE VISIT WITH PROVIDER: (Welcome to Medicare, initial annual wellness or annual wellness exam)  Virtual Visit via Video Note  I connected with Nancy Herman on 12/18/23 by a video enabled telemedicine application and verified that I am speaking with the correct person using two identifiers.  Location patient: home Location provider:work or home office Persons participating in the virtual visit: patient, provider  Concerns and/or follow up today: detailed intake and risks/health assessment completed in flowsheets and below - please see for details. No new concerns. For several months has intermittent chest discomfort that she feels might be be anxiety or  like medicine that did not go down properly. Usually occurs at rest. No palpitations, nausea, vomiting, SOB.  How often do you have a drink containing alcohol?n How many drinks containing alcohol do you have on a typical day when you are drinking?na How often do you have six or more drinks on one occasion?na Have you ever smoked?n Quit date if applicable? na  How many packs a day do/did you smoke? na Do you use smokeless tobacco?n Do you use an illicit drugs?n Do  you feel safe at home?y Last dentist visit?goes every 6 months Last eye Exam and location?her eye doctor retired, now sees Dr. Octavia. Last exam was in April.    See HM section in Epic for other details of completed HM.    ROS: negative for report of fevers, unintentional weight loss, vision changes, vision loss, hearing loss or change, chest pain, sob, hemoptysis, melena, hematochezia, hematuria, falls, bleeding or bruising, thoughts of suicide or self harm, memory loss  Patient-completed extensive health risk assessment - reviewed and discussed with the patient: See Health Risk Assessment completed with patient prior to the visit either above or in recent phone note. This was reviewed in detailed with the patient today and appropriate recommendations, orders and referrals were placed as needed per Summary below and patient instructions.   Review of Medical History: -PMH, PSH, Family History and current specialty and care providers reviewed and updated and listed below   Patient Care Team: Jordan, Betty G, MD as PCP - General (Family Medicine) Lavona Agent, MD as PCP - Cardiology (Cardiology)   Past Medical History:  Diagnosis Date   Anxiety    Frequent headaches    GERD (gastroesophageal reflux disease)    Hyperlipidemia    IBS (irritable bowel syndrome)    Pituitary insufficiency    Thyroid  disease     Past Surgical History:  Procedure Laterality Date   ABDOMINAL HYSTERECTOMY  1990   ANAL RECTAL MANOMETRY N/A 08/14/2018   Procedure: ANO RECTAL MANOMETRY;  Surgeon: Shila Gustav GAILS, MD;  Location: [facility];  Service: Endoscopy;  Laterality: N/A;   BRAIN SURGERY     BREAST BIOPSY Left 08/23/2022   MM LT BREAST BX W LOC DEV 1ST LESION IMAGE BX SPEC STEREO GUIDE 08/23/2022 [facility]   BREAST EXCISIONAL BIOPSY Right unsure   BREAST  EXCISIONAL BIOPSY Left unsure   eyelid surgery Bilateral 08/27/2021   OVARIAN CYST SURGERY  1994   REPAIR RECTOCELE  2016   and  prolapsed uterus.   TONSILLECTOMY  1973    Social History   Socioeconomic History   Marital status: Widowed    Spouse name: Not on file   Number of children: 2   Years of education: Not on file   Highest education level: Not on file  Occupational History   Occupation: retired  Tobacco Use   Smoking status: Never   Smokeless tobacco: Never  Vaping Use   Vaping status: Never Used  Substance and Sexual Activity   Alcohol use: No   Drug use: No   Sexual activity: Never  Other Topics Concern   Not on file  Social History Narrative   Lives alone.  Lives near son.  Was in [place].   Social Drivers of Corporate Investment Banker Strain: Low Risk  (12/18/2023)   Overall Financial Resource Strain (CARDIA)    Difficulty of Paying Living Expenses: Not very hard  Food Insecurity: Food Insecurity Present (12/18/2023)   Hunger Vital Sign    Worried About Running Out of Food in the Last Year: Sometimes true    Ran Out of Food in the Last Year: Never true  Transportation Needs: No Transportation Needs (12/18/2023)   PRAPARE - Administrator, Civil Service (Medical): No    Lack of Transportation (Non-Medical): No  Physical Activity: Insufficiently Active (12/18/2023)   Exercise Vital Sign    Days of Exercise per Week: 4 days    Minutes of Exercise per Session: 10 min  Stress: No Stress Concern Present (12/18/2023)   [employer] of Occupational Health - Occupational Stress Questionnaire    Feeling of Stress: Only a little  Social Connections: Moderately Integrated (12/18/2023)   Social Connection and Isolation Panel    Frequency of Communication with Friends and Family: More than three times a week    Frequency of Social Gatherings with Friends and Family: Twice a week    Attends Religious Services: More than 4 times per year    Active Member of [employer] or Organizations: Yes    Attends Banker Meetings: More than 4 times per year    Marital Status: Widowed   Intimate Partner Violence: Not At Risk (12/18/2023)   Humiliation, Afraid, Rape, and Kick questionnaire    Fear of Current or Ex-Partner: No    Emotionally Abused: No    Physically Abused: No    Sexually Abused: No    Family History  Problem Relation Age of Onset   Heart disease Mother        No details    Alcohol abuse Mother    Drug abuse Mother    Sickle cell anemia Mother    Heart disease Father        No details . Questionable bypass or stent   Drug abuse Father    Alcohol abuse Father    Colon cancer Maternal Aunt    Esophageal cancer Neg Hx    Pancreatic cancer Neg Hx    Stomach cancer Neg Hx    Breast cancer Neg Hx    BRCA 1/2 Neg Hx     Current Outpatient Medications on File Prior to Visit  Medication Sig Dispense Refill   carbamide peroxide (DEBROX) 6.5 % OTIC solution Place 5 drops into the left ear 2 (two) times daily. 15 mL 0   cetirizine (  ZYRTEC) 10 MG tablet Take 10 mg by mouth daily.     FLUoxetine  (PROZAC ) 20 MG capsule Take 1 capsule (20 mg total) by mouth daily. 30 capsule 7   linaclotide  (LINZESS ) 290 MCG CAPS capsule Take 290 mcg by mouth daily before breakfast.     montelukast  (SINGULAIR ) 10 MG tablet TAKE 1 TABLET BY MOUTH AT BEDTIME 90 tablet 2   pantoprazole  (PROTONIX ) 40 MG tablet Take 1 tablet (40 mg total) by mouth daily. 90 tablet 3   risperiDONE  (RISPERDAL ) 0.5 MG tablet 2 qhs 60 tablet 7   rosuvastatin  (CRESTOR ) 20 MG tablet Take 1 tablet by mouth once daily 90 tablet 0   TURMERIC PO Take by mouth.     fluconazole  (DIFLUCAN ) 100 MG tablet Take 1 tablet (100 mg total) by mouth daily. (Patient not taking: Reported on 12/18/2023) 14 tablet 0   No current facility-administered medications on file prior to visit.    Allergies  Allergen Reactions   Other Other (See Comments)    Seasonal allergies   Gramineae Pollens Other (See Comments)    Sneezing, runny nose, watery eyes       Physical Exam Vitals requested from patient and listed  below if patient had equipment and was able to obtain at home for this virtual visit: There were no vitals filed for this visit. Estimated body mass index is 25.61 kg/m as calculated from the following:   Height as of 11/13/23: 5' 3 (1.6 m).   Weight as of 11/13/23: 144 lb 9.6 oz (65.6 kg).  EKG (optional): deferred due to virtual visit  GENERAL: alert, oriented, no acute distress detected, full vision exam deferred due to pandemic and/or virtual encounter  HEENT: atraumatic, conjunttiva clear, no obvious abnormalities on inspection of external nose and ears  NECK: normal movements of the head and neck  LUNGS: on inspection no signs of respiratory distress, breathing rate appears normal, no obvious gross SOB, gasping or wheezing  CV: no obvious cyanosis  MS: moves all visible extremities without noticeable abnormality  PSYCH/NEURO: pleasant and cooperative, no obvious depression or anxiety, speech and thought processing grossly intact, Cognitive function grossly intact  Flowsheet Row Clinical Support from 12/18/2023 in [facility] [place]  PHQ-9 Total Score 2        12/18/2023    9:50 AM 11/11/2023    9:36 AM 12/02/2022    9:35 AM 11/11/2022    9:21 AM 07/29/2022   12:45 PM  Depression screen PHQ 2/9  Decreased Interest 0 1 0 0 1  Down, Depressed, Hopeless 0 0 0 0 0  PHQ - 2 Score 0 1 0 0 1  Altered sleeping 0 1 0 0 1  Tired, decreased energy 1 1 0 0 1  Change in appetite 0 1 0 0 1  Feeling bad or failure about yourself  0 0 0 0 0  Trouble concentrating 1 1 0 0 1  Moving slowly or fidgety/restless 0 0 0 0 0  Suicidal thoughts 0 0 0 0 0  PHQ-9 Score 2 5  0  0  5   Difficult doing work/chores Not difficult at all Not difficult at all Not difficult at all Not difficult at all Not difficult at all     Data saved with a previous flowsheet row definition       07/29/2022   12:45 PM 12/02/2022    9:35 AM 11/11/2023    9:36 AM 12/18/2023    9:53 AM  12/18/2023  10:19 AM  Fall Risk  Falls in the past year? 0 0 1 0 1  Was there an injury with Fall? 0 0 1 1 1   Fall Risk Category Calculator 0 0 2 1 2   Patient at Risk for Falls Due to Other (Comment) No Fall Risks History of fall(s) No Fall Risks History of fall(s)  Fall risk Follow up Falls evaluation completed Falls prevention discussed Falls evaluation completed Falls evaluation completed Falls evaluation completed;Education provided     SUMMARY AND PLAN:  Encounter for Medicare annual wellness exam  Discussed applicable health maintenance/preventive health measures and advised and referred or ordered per patient preferences:  Health Maintenance  Topic Date Due   COVID-19 Vaccine (9 - 2025-26 season) 06/15/2024   Medicare Annual Wellness (AWV)  12/17/2024   Colonoscopy  09/03/2025   DTaP/Tdap/Td (4 - Td or Tdap) 12/15/2032   Pneumococcal Vaccine: 50+ Years  Completed   Influenza Vaccine  Completed   DEXA SCAN  Completed   Hepatitis C Screening  Completed   Zoster Vaccines- Shingrix  Completed   Meningococcal B Vaccine  Aged Out   Mammogram  Discontinued      Education and counseling on the following was provided based on the above review of health and a plan/checklist for the patient, along with additional information discussed, was provided for the patient in the patient instructions :  -advised to call office today after the visit to schedule in office assessment for her complaints. She agrees to call. Sent message to schedulers. Also advised if any worsening or severe symptoms or new symptoms to seek inperson care immediately at the [facility] or ER if unable to obtain office visit.  -Advised to bring advanced directives to next appt -Provided counseling and plan for increased risk of falling if applicable per above screening. Reviewed and demonstrated safe balance exercises that can be done at home to improve balance and discussed exercise guidelines for adults with include  balance exercises at least 3 days per week.  -Advised and counseled on a healthy lifestyle - including the importance of a healthy diet, regular physical activity, social connections and stress management. -Reviewed patient's current diet. Advised and counseled on a whole foods based healthy diet. A summary of a healthy diet was provided in the Patient Instructions.  -reviewed patient's current physical activity level and discussed exercise guidelines for adults. Discussed community resources and ideas for safe exercise at home to assist in meeting exercise guideline recommendations in a safe and healthy way.  -Advise yearly dental visits at minimum and regular eye exams   Follow up: see patient instructions     Patient Instructions  I really enjoyed getting to talk with you today! I am available on Tuesdays and Thursdays for virtual visits if you have any questions or concerns, or if I can be of any further assistance.   CHECKLIST FROM ANNUAL WELLNESS VISIT:  -Follow up (please call to schedule if not scheduled after visit):   -CALL TODAY FOR in office evaluation of current issues in the next few days   -yearly for annual wellness visit with primary care office  Here is a list of your preventive care/health maintenance measures and the plan for each if any are due:  PLAN For any measures below that may be due:   Health Maintenance  Topic Date Due   COVID-19 Vaccine (9 - 2025-26 season) 06/15/2024   Medicare Annual Wellness (AWV)  12/17/2024   Colonoscopy  09/03/2025   DTaP/Tdap/Td (4 -  Td or Tdap) 12/15/2032   Pneumococcal Vaccine: 50+ Years  Completed   Influenza Vaccine  Completed   DEXA SCAN  Completed   Hepatitis C Screening  Completed   Zoster Vaccines- Shingrix  Completed   Meningococcal B Vaccine  Aged Out   Mammogram  Discontinued    -See a dentist at least yearly  -Get your eyes checked and then per your eye specialist's recommendations  -Other issues addressed  today:   -I have included below further information regarding a healthy whole foods based diet, physical activity guidelines for adults, stress management and opportunities for social connections. I hope you find this information useful.   -----------------------------------------------------------------------------------------------------------------------------------------------------------------------------------------------------------------------------------------------------------    NUTRITION: -eat real food: lots of colorful vegetables (half the plate) and fruits -5-7 servings of vegetables and fruits per day (fresh or steamed is best), exp. 2 servings of vegetables with lunch and dinner and 2 servings of fruit per day. Berries and greens such as kale and collards are great choices.  -consume on a regular basis:  fresh fruits, fresh veggies, fish, nuts, seeds, healthy oils (such as olive oil, avocado oil), whole grains (make sure for bread/pasta/crackers/etc., that the first ingredient on label contains the word whole), legumes. -can eat small amounts of dairy and lean meat (no larger than the palm of your hand), but avoid processed meats such as ham, bacon, lunch meat, etc. -drink water -try to avoid fast food and pre-packaged foods, processed meat, ultra processed foods/beverages (donuts, candy, etc.) -most experts advise limiting sodium to < 2300mg  per day, should limit further is any chronic conditions such as high blood pressure, heart disease, diabetes, etc. The American Heart Association advised that < 1500mg  is is ideal -try to avoid foods/beverages that contain any ingredients with names you do not recognize  -try to avoid foods/beverages  with added sugar or sweeteners/sweets  -try to avoid sweet drinks (including diet drinks): soda, juice, Gatorade, sweet tea, power drinks, diet drinks -try to avoid white rice, white bread, pasta (unless whole grain)  EXERCISE GUIDELINES  FOR ADULTS: -if you wish to increase your physical activity, do so gradually and with the approval of your doctor -STOP and seek medical care immediately if you have any chest pain, chest discomfort or trouble breathing when starting or increasing exercise  -move and stretch your body, legs, feet and arms when sitting for long periods -Physical activity guidelines for optimal health in adults: -get at least 150 minutes per week of moderate exercise (can talk, but not sing); this is about 20-30 minutes of sustained activity 5-7 days per week or two 10-15 minute episodes of sustained activity 5-7 days per week -do some muscle building/resistance training/strength training at least 2 days per week  -balance exercises 3+ days per week:   Stand somewhere where you have something sturdy to hold onto if you lose balance    1) lift up on toes, then back down, start with 5x per day and work up to 20x   2) stand and lift one leg straight out to the side so that foot is a few inches of the floor, start with 5x each side and work up to 20x each side   3) stand on one foot, start with 5 seconds each side and work up to 20 seconds on each side  If you need ideas or help with getting more active:  -Silver sneakers https://tools.silversneakers.com  -Walk with a Doc: Http://www.duncan-williams.com/  -try to include resistance (weight lifting/strength building) and balance exercises  twice per week: or the following link for ideas: http://castillo-powell.com/  buyducts.dk  STRESS MANAGEMENT: -can try meditating, or just sitting quietly with deep breathing while intentionally relaxing all parts of your body for 5 minutes daily -if you need further help with stress, anxiety or depression please follow up with your primary doctor or contact the wonderful folks at [facility]: [phone]  SOCIAL  CONNECTIONS: -options in [place] if you wish to engage in more social and exercise related activities:  -Silver sneakers https://tools.silversneakers.com  -Walk with a Doc: Http://www.duncan-williams.com/  -Check out the [facility] Active Adults 50+ section on the [place] of [employer] companies (hiking clubs, book clubs, cards and games, chess, exercise classes, aquatic classes and much more) - see the website for details: https://www.[place].gov/departments/parks-recreation/active-adults50  -YouTube has lots of exercise videos for different ages and abilities as well  -[facility] (a variety of indoor and outdoor inperson activities for adults). 3130967564. [address].  -Virtual Online Classes (a variety of topics): see seniorplanet.org or call [phone])[phone]  -consider volunteering at a school, hospice center, church, senior center or elsewhere            Chiquita JONELLE Cramp, DO

## 2023-12-18 NOTE — Telephone Encounter (Signed)
 FYI Only or Action Required?: FYI only for provider: appointment scheduled on 12/19/2023.  Patient was last seen in primary care on 12/18/2023 by Luke Chiquita SAUNDERS, DO.  Called Nurse Triage reporting Chest Pain.  Symptoms began several months ago.  Interventions attempted: Nothing.  Symptoms are: unchanged.  Triage Disposition: See Physician Within 24 Hours  Patient/caregiver understands and will follow disposition?: Yes           Copied from CRM #[id number]. Topic: Clinical - Red Word Triage >> Dec 18, 2023 10:45 AM Nancy Herman wrote: Red Word that prompted transfer to Nurse Triage: chest tightness; Patient is experiencing tightness in chest, feels like something is stopping in it. Patient stated that she just got off the call with a wellness doctor. Reason for Disposition  [1] Chest pain lasts < 5 minutes AND [2] NO chest pain or cardiac symptoms (e.g., breathing difficulty, sweating) now  (Exception: Chest pains that last only a few seconds.)  Answer Assessment - Initial Assessment Questions 1. LOCATION: Where does it hurt?       On R side of chest  2. RADIATION: Does the pain go anywhere else? (e.g., into neck, jaw, arms, back)     Pain is just in R side near breast area.  3. ONSET: When did the chest pain begin? (Minutes, hours or days)      July  4. PATTERN: Does the pain come and go, or has it been constant since it started?  Does it get worse with exertion?      Comes and goes  5. DURATION: How long does it last (e.g., seconds, minutes, hours)     A minute or more  6. SEVERITY: How bad is the pain?  (e.g., Scale 1-10; mild, moderate, or severe)     Moderate  7. CARDIAC RISK FACTORS: Do you have any history of heart problems or risk factors for heart disease? (e.g., angina, prior heart attack; diabetes, high blood pressure, high cholesterol, smoker, or strong family history of heart disease)     Denies  8. PULMONARY RISK FACTORS: Do you have any history of  lung disease?  (e.g., blood clots in lung, asthma, emphysema, birth control pills)     Denies  9. OTHER SYMPTOMS: Do you have any other symptoms? (e.g., dizziness, nausea, vomiting, sweating, fever, difficulty breathing, cough)       Denies   Pain now gone. Patient states she feels like she swallowed something and it has not gone down.  Protocols used: Chest Pain-A-AH

## 2023-12-18 NOTE — Patient Instructions (Signed)
 I really enjoyed getting to talk with you today! I am available on Tuesdays and Thursdays for virtual visits if you have any questions or concerns, or if I can be of any further assistance.   CHECKLIST FROM ANNUAL WELLNESS VISIT:  -Follow up (please call to schedule if not scheduled after visit):   -CALL TODAY FOR in office evaluation of current issues in the next few days   -yearly for annual wellness visit with primary care office  Here is a list of your preventive care/health maintenance measures and the plan for each if any are due:  PLAN For any measures below that may be due:   Health Maintenance  Topic Date Due   COVID-19 Vaccine (9 - 2025-26 season) 06/15/2024   Medicare Annual Wellness (AWV)  12/17/2024   Colonoscopy  09/03/2025   DTaP/Tdap/Td (4 - Td or Tdap) 12/15/2032   Pneumococcal Vaccine: 50+ Years  Completed   Influenza Vaccine  Completed   DEXA SCAN  Completed   Hepatitis C Screening  Completed   Zoster Vaccines- Shingrix  Completed   Meningococcal B Vaccine  Aged Out   Mammogram  Discontinued    -See a dentist at least yearly  -Get your eyes checked and then per your eye specialist's recommendations  -Other issues addressed today:   -I have included below further information regarding a healthy whole foods based diet, physical activity guidelines for adults, stress management and opportunities for social connections. I hope you find this information useful.   -----------------------------------------------------------------------------------------------------------------------------------------------------------------------------------------------------------------------------------------------------------    NUTRITION: -eat real food: lots of colorful vegetables (half the plate) and fruits -5-7 servings of vegetables and fruits per day (fresh or steamed is best), exp. 2 servings of vegetables with lunch and dinner and 2 servings of fruit per day. Berries  and greens such as kale and collards are great choices.  -consume on a regular basis:  fresh fruits, fresh veggies, fish, nuts, seeds, healthy oils (such as olive oil, avocado oil), whole grains (make sure for bread/pasta/crackers/etc., that the first ingredient on label contains the word whole), legumes. -can eat small amounts of dairy and lean meat (no larger than the palm of your hand), but avoid processed meats such as ham, bacon, lunch meat, etc. -drink water -try to avoid fast food and pre-packaged foods, processed meat, ultra processed foods/beverages (donuts, candy, etc.) -most experts advise limiting sodium to < 2300mg  per day, should limit further is any chronic conditions such as high blood pressure, heart disease, diabetes, etc. The American Heart Association advised that < 1500mg  is is ideal -try to avoid foods/beverages that contain any ingredients with names you do not recognize  -try to avoid foods/beverages  with added sugar or sweeteners/sweets  -try to avoid sweet drinks (including diet drinks): soda, juice, Gatorade, sweet tea, power drinks, diet drinks -try to avoid white rice, white bread, pasta (unless whole grain)  EXERCISE GUIDELINES FOR ADULTS: -if you wish to increase your physical activity, do so gradually and with the approval of your doctor -STOP and seek medical care immediately if you have any chest pain, chest discomfort or trouble breathing when starting or increasing exercise  -move and stretch your body, legs, feet and arms when sitting for long periods -Physical activity guidelines for optimal health in adults: -get at least 150 minutes per week of moderate exercise (can talk, but not sing); this is about 20-30 minutes of sustained activity 5-7 days per week or two 10-15 minute episodes of sustained activity 5-7 days per week -  do some muscle building/resistance training/strength training at least 2 days per week  -balance exercises 3+ days per week:   Stand  somewhere where you have something sturdy to hold onto if you lose balance    1) lift up on toes, then back down, start with 5x per day and work up to 20x   2) stand and lift one leg straight out to the side so that foot is a few inches of the floor, start with 5x each side and work up to 20x each side   3) stand on one foot, start with 5 seconds each side and work up to 20 seconds on each side  If you need ideas or help with getting more active:  -Silver sneakers https://tools.silversneakers.com  -Walk with a Doc: Http://www.duncan-williams.com/  -try to include resistance (weight lifting/strength building) and balance exercises twice per week: or the following link for ideas: http://castillo-powell.com/  buyducts.dk  STRESS MANAGEMENT: -can try meditating, or just sitting quietly with deep breathing while intentionally relaxing all parts of your body for 5 minutes daily -if you need further help with stress, anxiety or depression please follow up with your primary doctor or contact the wonderful folks at [facility]: [phone]  SOCIAL CONNECTIONS: -options in [place] if you wish to engage in more social and exercise related activities:  -Silver sneakers https://tools.silversneakers.com  -Walk with a Doc: Http://www.duncan-williams.com/  -Check out the [facility] Active Adults 50+ section on the [place] of [employer] (hiking clubs, book clubs, cards and games, chess, exercise classes, aquatic classes and much more) - see the website for details: https://www.[place]-.gov/departments/parks-recreation/active-adults50  -YouTube has lots of exercise videos for different ages and abilities as well  -[facility] (a variety of indoor and outdoor inperson activities for adults). [phone]. [address].  -Virtual Online Classes (a variety of topics): see  seniorplanet.org or call (573)341-8978  -consider volunteering at a school, hospice center, church, senior center or elsewhere

## 2023-12-19 ENCOUNTER — Ambulatory Visit (INDEPENDENT_AMBULATORY_CARE_PROVIDER_SITE_OTHER): Admitting: Family Medicine

## 2023-12-19 ENCOUNTER — Encounter: Payer: Self-pay | Admitting: Family Medicine

## 2023-12-19 VITALS — BP 122/74 | HR 80 | Temp 97.9°F | Resp 16 | Ht 63.0 in | Wt 145.6 lb

## 2023-12-19 DIAGNOSIS — K219 Gastro-esophageal reflux disease without esophagitis: Secondary | ICD-10-CM

## 2023-12-19 DIAGNOSIS — R079 Chest pain, unspecified: Secondary | ICD-10-CM

## 2023-12-19 MED ORDER — FAMOTIDINE 20 MG PO TABS: 20.0000 mg | ORAL_TABLET | Freq: Every day | ORAL | 0 refills | Status: AC

## 2023-12-19 NOTE — Patient Instructions (Addendum)
 A few things to remember from today's visit:  Chest pain, unspecified type - Plan: EKG 12-Lead  Gastroesophageal reflux disease without esophagitis - Plan: famotidine  (PEPCID ) 20 MG tablet  Today Famotidine  20 mg added at bedtime. Continue Pantoprazole  40 mg before breakfast. Monitor for new symptoms. I see you in 6-8 weeks.  If you need refills for medications you take chronically, please call your pharmacy. Do not use My Chart to request refills or for acute issues that need immediate attention. If you send a my chart message, it may take a few days to be addressed, specially if I am not in the office.  Please be sure medication list is accurate. If a new problem present, please set up appointment sooner than planned today.

## 2023-12-19 NOTE — Assessment & Plan Note (Signed)
 This problem could be contributing to her symptoms. She had EGD done in 02/2023. Currently she is on pantoprazole  40 mg before breakfast. She prefers to hold on following with GI for now. She agrees with adding famotidine  20 mg at bedtime. Continue GERD precautions. Follow-up in 6 to 8 weeks.

## 2023-12-19 NOTE — Progress Notes (Signed)
 ACUTE VISIT Chief Complaint  Patient presents with   Chest Pain    Pt c/o chest pain started  back in July. Once in a while. Had medicare wellness yesterday and was told to follow up with pcp.for tighten in chest yesterday. Feels little bit today. Daughter told her might be indigestion. A little bit SOB. No radiation.     Discussed the use of AI scribe software for clinical note transcription with the patient, who gave verbal consent to proceed.  History of Present Illness Nancy Herman is a 78 year old female a PMHx significant for GERD, depression, anxiety, prediabetes, HLD, OA, IBS constipation, and allergies, who presents with chest tightness as described above.  She has been experiencing intermittent chest tightness since July, occurring about twice a week. The tightness is located in the middle of the chest and sometimes radiates towards the right side. The tightness can occur during activities like walking the dog (at the end) or while relaxed, such as during a phone call. The sensation is described as pressure-like and is sometimes accompanied by a feeling of heat across the body. Episodes last approximately ten minutes and are rated as a 5 out of 10 in severity.  No radiation of the pain to the arm or neck, and there is no associated diaphoresis, palpitations, dizziness, or difficulty breathing.   She does not experience heartburn, burping, or nausea with the episodes. She has not tried any medication for the chest pain, and typically the discomfort resolves on its own with relaxation.  GERD: She underwent an upper endoscopy in January/2025, revealing a hiatal hernia. She is currently taking Protonix  40 mg daily before breakfast. She denies current difficulty swallowing, although she previously experienced this symptom, prompting the endoscopy. Negative fro abdominal pain or melena.  She has undergone cardiac evaluation in the past, including EKGs, a chest CT, and  stress tests, all of which were negative for ischemia. Her last cardiology evaluation was in October 2021, where she was assessed for left-sided chest pain. 04/2015 Lexican stress test neg for ischemia and normal LVEF.  Exercise tolerance test 11/2019:Negative.  Review of Systems  Constitutional:  Negative for activity change, appetite change and fever.  HENT:  Negative for mouth sores and sore throat.   Respiratory:  Negative for cough, shortness of breath and wheezing.   Cardiovascular:  Positive for chest pain. Negative for leg swelling.  Gastrointestinal:  Negative for abdominal pain, nausea and vomiting.  Genitourinary:  Negative for decreased urine volume and hematuria.  Musculoskeletal:  Negative for back pain and myalgias.  See other pertinent positives and negatives in HPI.  Current Outpatient Medications on File Prior to Visit  Medication Sig Dispense Refill   carbamide peroxide (DEBROX) 6.5 % OTIC solution Place 5 drops into the left ear 2 (two) times daily. 15 mL 0   cetirizine (ZYRTEC) 10 MG tablet Take 10 mg by mouth daily.     FLUoxetine  (PROZAC ) 20 MG capsule Take 1 capsule (20 mg total) by mouth daily. 30 capsule 7   linaclotide  (LINZESS ) 290 MCG CAPS capsule Take 290 mcg by mouth daily before breakfast.     montelukast  (SINGULAIR ) 10 MG tablet TAKE 1 TABLET BY MOUTH AT BEDTIME 90 tablet 2   pantoprazole  (PROTONIX ) 40 MG tablet Take 1 tablet (40 mg total) by mouth daily. 90 tablet 3   risperiDONE  (RISPERDAL ) 0.5 MG tablet 2 qhs 60 tablet 7   rosuvastatin  (CRESTOR ) 20 MG tablet Take 1 tablet by mouth  once daily 90 tablet 0   TURMERIC PO Take by mouth.     fluconazole  (DIFLUCAN ) 100 MG tablet Take 1 tablet (100 mg total) by mouth daily. (Patient not taking: Reported on 12/19/2023) 14 tablet 0   No current facility-administered medications on file prior to visit.    Past Medical History:  Diagnosis Date   Anxiety    Frequent headaches    GERD (gastroesophageal reflux  disease)    Hyperlipidemia    IBS (irritable bowel syndrome)    Pituitary insufficiency    Thyroid  disease    Allergies  Allergen Reactions   Other Other (See Comments)    Seasonal allergies   Gramineae Pollens Other (See Comments)    Sneezing, runny nose, watery eyes    Social History   Socioeconomic History   Marital status: Widowed    Spouse name: Not on file   Number of children: 2   Years of education: Not on file   Highest education level: Not on file  Occupational History   Occupation: retired  Tobacco Use   Smoking status: Never   Smokeless tobacco: Never  Vaping Use   Vaping status: Never Used  Substance and Sexual Activity   Alcohol use: No   Drug use: No   Sexual activity: Never  Other Topics Concern   Not on file  Social History Narrative   Lives alone.  Lives near son.  Was in [place].   Social Drivers of Corporate Investment Banker Strain: Low Risk  (12/18/2023)   Overall Financial Resource Strain (CARDIA)    Difficulty of Paying Living Expenses: Not very hard  Food Insecurity: Food Insecurity Present (12/18/2023)   Hunger Vital Sign    Worried About Running Out of Food in the Last Year: Sometimes true    Ran Out of Food in the Last Year: Never true  Transportation Needs: No Transportation Needs (12/18/2023)   PRAPARE - Administrator, Civil Service (Medical): No    Lack of Transportation (Non-Medical): No  Physical Activity: Insufficiently Active (12/18/2023)   Exercise Vital Sign    Days of Exercise per Week: 4 days    Minutes of Exercise per Session: 10 min  Stress: No Stress Concern Present (12/18/2023)   [employer] of Occupational Health - Occupational Stress Questionnaire    Feeling of Stress: Only a little  Social Connections: Moderately Integrated (12/18/2023)   Social Connection and Isolation Panel    Frequency of Communication with Friends and Family: More than three times a week    Frequency of Social Gatherings with  Friends and Family: Twice a week    Attends Religious Services: More than 4 times per year    Active Member of [employer] or Organizations: Yes    Attends Banker Meetings: More than 4 times per year    Marital Status: Widowed    Vitals:   12/19/23 0745  BP: 122/74  Pulse: 80  Resp: 16  Temp: 97.9 F (36.6 C)  SpO2: 98%   Body mass index is 25.79 kg/m.  Physical Exam Vitals and nursing note reviewed.  Constitutional:      General: She is not in acute distress.    Appearance: She is well-developed.  HENT:     Head: Normocephalic and atraumatic.     Mouth/Throat:     Mouth: Mucous membranes are moist.     Pharynx: Oropharynx is clear.  Eyes:     Conjunctiva/sclera: Conjunctivae normal.  Cardiovascular:  Rate and Rhythm: Normal rate and regular rhythm.     Pulses:          Dorsalis pedis pulses are 2+ on the right side and 2+ on the left side.     Heart sounds: No murmur heard. Pulmonary:     Effort: Pulmonary effort is normal. No respiratory distress.     Breath sounds: Normal breath sounds.  Chest:     Chest wall: No tenderness.  Abdominal:     Palpations: Abdomen is soft. There is no hepatomegaly or mass.     Tenderness: There is no abdominal tenderness.  Lymphadenopathy:     Cervical: No cervical adenopathy.  Skin:    General: Skin is warm.     Findings: No erythema or rash.  Neurological:     General: No focal deficit present.     Mental Status: She is alert and oriented to person, place, and time.     Gait: Gait normal.  Psychiatric:        Mood and Affect: Mood is not anxious or depressed. Affect is flat.    ASSESSMENT AND PLAN:  Ms. Hegner was seen today for chest pain.  Chest pain, unspecified type We discussed possible etiologies. This problem has been going on intermittently for some time. Explained that history is not suggestive of a serious cardiac problem. EKG today normal sinus rhythm, normal axis and intervals,no signs of  ischemia. No significant changes when compared with prior EKGs. She has had cardiac workup in the past for chest pain and otherwise negative.. We discussed options, which included GI evaluation, cardiology evaluation, monitoring for new symptoms, and adding famotidine , she prefers the latter one. She was clearly instructed about warning signs. She voices understanding.  -     EKG 12-Lead  Gastroesophageal reflux disease without esophagitis Assessment & Plan: This problem could be contributing to her symptoms. She had EGD done in 02/2023. Currently she is on pantoprazole  40 mg before breakfast. She prefers to hold on following with GI for now. She agrees with adding famotidine  20 mg at bedtime. Continue GERD precautions. Follow-up in 6 to 8 weeks.  Orders: -     Famotidine ; Take 1 tablet (20 mg total) by mouth at bedtime.  Dispense: 90 tablet; Refill: 0  Return in about 8 weeks (around 02/13/2024).  Galina Haddox G. Rohail Klees, MD  [facility]. [place] office.

## 2023-12-20 ENCOUNTER — Ambulatory Visit: Payer: Self-pay | Admitting: Family Medicine

## 2024-02-04 ENCOUNTER — Other Ambulatory Visit: Payer: Self-pay | Admitting: Gastroenterology

## 2024-02-04 DIAGNOSIS — K219 Gastro-esophageal reflux disease without esophagitis: Secondary | ICD-10-CM

## 2024-02-04 DIAGNOSIS — K222 Esophageal obstruction: Secondary | ICD-10-CM

## 2024-02-04 DIAGNOSIS — K449 Diaphragmatic hernia without obstruction or gangrene: Secondary | ICD-10-CM

## 2024-02-07 ENCOUNTER — Other Ambulatory Visit: Payer: Self-pay | Admitting: Family Medicine

## 2024-02-13 ENCOUNTER — Ambulatory Visit: Admitting: Family Medicine

## 2024-02-13 ENCOUNTER — Telehealth: Payer: Self-pay | Admitting: Gastroenterology

## 2024-02-13 ENCOUNTER — Encounter: Payer: Self-pay | Admitting: Family Medicine

## 2024-02-13 VITALS — BP 118/62 | Temp 98.8°F | Resp 16 | Ht 63.0 in | Wt 144.4 lb

## 2024-02-13 DIAGNOSIS — K219 Gastro-esophageal reflux disease without esophagitis: Secondary | ICD-10-CM | POA: Diagnosis not present

## 2024-02-13 DIAGNOSIS — Z86018 Personal history of other benign neoplasm: Secondary | ICD-10-CM

## 2024-02-13 DIAGNOSIS — N1831 Chronic kidney disease, stage 3a: Secondary | ICD-10-CM | POA: Diagnosis not present

## 2024-02-13 DIAGNOSIS — F323 Major depressive disorder, single episode, severe with psychotic features: Secondary | ICD-10-CM

## 2024-02-13 NOTE — Telephone Encounter (Signed)
 Patient needs an appointment. Last seen a year ago. If Linzess  is not working well anymore, she will need to discuss a change of therapy/medication.  Called the patient. Got her voicemail. Left message recommending an appointment. Patient seen by PCP today.

## 2024-02-13 NOTE — Assessment & Plan Note (Signed)
 Chest discomfort she reported last visit has resolved. We discussed differential diagnosis. Continue pantoprazole  40 mg before breakfast and famotidine  20 mg at bedtime as well as GERD precautions. Continue following with gastroenterologist.

## 2024-02-13 NOTE — Assessment & Plan Note (Signed)
 Problem has been otherwise stable. In 11/2023 her creatinine was 1.18 and eGFR 48. Continue adequate hydration, low-salt diet, and avoidance of NSAIDs. We will plan on rechecking renal function next visit.

## 2024-02-13 NOTE — Progress Notes (Unsigned)
 "  Chief Complaint  Patient presents with   Medical Management of Chronic Issues    8 week follow-up    [name] is a 79 y.o. female, who is here today for chronic disease management. Last seen on 12/19/23, when she was c/o CP. Discussed the use of AI scribe software for clinical note transcription with the patient, who gave verbal consent to proceed.  History of Present Illness Nancy Herman is a 79 year old female who presents with chest discomfort.  She experienced chest discomfort on December 1st and December 3rd, but has not had any episodes since then. She is currently taking pantoprazole  for acid reflux, with one dose before breakfast and another at bedtime. No chest pain, palpitations, or difficulty breathing during exertion, such as when cleaning bathroom floors, although she notes feeling tired. She has previously seen a cardiologist for chest pain and underwent a stress test in 2021 and heart imaging in 2017. An EKG was performed during a previous visit.  She mentions having issues with Linzess , which she takes for constipation. It causes diarrhea and gas, and she takes it every other day or every third day to manage these side effects. She also takes an iron pill, which can affect stool color.  Her kidney function was checked in October and was stable. She follows up with her endocrinologist annually and sees her psychiatrist every three months.  Hypertension:  Negative for unusual or severe headache, visual changes, exertional chest pain, dyspnea,  focal weakness, or edema.  Lab Results  Component Value Date   NA 143 11/13/2023   CL 106 11/13/2023   K 3.8 11/13/2023   CO2 31 11/13/2023   BUN 10 11/13/2023   CREATININE 1.18 (H) 11/13/2023   EGFR 48 (L) 11/13/2023   CALCIUM  9.5 11/13/2023   ALBUMIN 4.2 01/24/2023   GLUCOSE 87 11/13/2023   Review of Systems See other pertinent positives and negatives in HPI.  Medications Ordered Prior to  Encounter[1]  Past Medical History:  Diagnosis Date   Anxiety    Frequent headaches    GERD (gastroesophageal reflux disease)    Hyperlipidemia    IBS (irritable bowel syndrome)    Pituitary insufficiency    Thyroid  disease     Allergies[2]  Social History   Socioeconomic History   Marital status: Widowed    Spouse name: Not on file   Number of children: 2   Years of education: Not on file   Highest education level: Not on file  Occupational History   Occupation: retired  Tobacco Use   Smoking status: Never   Smokeless tobacco: Never  Vaping Use   Vaping status: Never Used  Substance and Sexual Activity   Alcohol use: No   Drug use: No   Sexual activity: Never  Other Topics Concern   Not on file  Social History Narrative   Lives alone.  Lives near son.  Was in [place].   Social Drivers of Health   Tobacco Use: Low Risk (02/13/2024)   Patient History    Smoking Tobacco Use: Never    Smokeless Tobacco Use: Never    Passive Exposure: Not on file  Financial Resource Strain: Low Risk (12/18/2023)   Overall Financial Resource Strain (CARDIA)    Difficulty of Paying Living Expenses: Not very hard  Food Insecurity: Food Insecurity Present (12/18/2023)   Epic    Worried About Radiation Protection Practitioner of Food in the Last Year: Sometimes true    Ran Out  of Food in the Last Year: Never true  Transportation Needs: No Transportation Needs (12/18/2023)   Epic    Lack of Transportation (Medical): No    Lack of Transportation (Non-Medical): No  Physical Activity: Insufficiently Active (12/18/2023)   Exercise Vital Sign    Days of Exercise per Week: 4 days    Minutes of Exercise per Session: 10 min  Stress: No Stress Concern Present (12/18/2023)   [employer] of Occupational Health - Occupational Stress Questionnaire    Feeling of Stress: Only a little  Social Connections: Moderately Integrated (12/18/2023)   Social Connection and Isolation Panel    Frequency of Communication with  Friends and Family: More than three times a week    Frequency of Social Gatherings with Friends and Family: Twice a week    Attends Religious Services: More than 4 times per year    Active Member of [employer] or Organizations: Yes    Attends Banker Meetings: More than 4 times per year    Marital Status: Widowed  Depression (PHQ2-9): Low Risk (12/18/2023)   Depression (PHQ2-9)    PHQ-2 Score: 2  Recent Concern: Depression (PHQ2-9) - Medium Risk (11/11/2023)   Depression (PHQ2-9)    PHQ-2 Score: 5  Alcohol Screen: Low Risk (12/18/2023)   Alcohol Screen    Last Alcohol Screening Score (AUDIT): 0  Housing: Low Risk (12/18/2023)   Epic    Unable to Pay for Housing in the Last Year: No    Number of Times Moved in the Last Year: 0    Homeless in the Last Year: No  Utilities: Not At Risk (12/18/2023)   Epic    Threatened with loss of utilities: No  Health Literacy: Adequate Health Literacy (12/18/2023)   B1300 Health Literacy    Frequency of need for help with medical instructions: Never    Today's Vitals   02/13/24 0934  BP: 118/62  Resp: 16  Temp: 98.8 F (37.1 C)  SpO2: 95%  Weight: 144 lb 6.4 oz (65.5 kg)  Height: 5' 3 (1.6 m)    Body mass index is 25.58 kg/m.  Physical Exam Vitals and nursing note reviewed.  Constitutional:      General: She is not in acute distress.    Appearance: She is well-developed.  HENT:     Head: Normocephalic and atraumatic.     Mouth/Throat:     Mouth: Mucous membranes are dry.     Pharynx: Oropharynx is clear.  Eyes:     Conjunctiva/sclera: Conjunctivae normal.  Cardiovascular:     Rate and Rhythm: Normal rate and regular rhythm.     Pulses:          Dorsalis pedis pulses are 2+ on the right side and 2+ on the left side.     Heart sounds: No murmur heard. Pulmonary:     Effort: Pulmonary effort is normal. No respiratory distress.     Breath sounds: Normal breath sounds.  Abdominal:     Palpations: Abdomen is soft. There  is no mass.     Tenderness: There is no abdominal tenderness.  Lymphadenopathy:     Cervical: No cervical adenopathy.  Skin:    General: Skin is warm.     Findings: No erythema or rash.  Neurological:     General: No focal deficit present.     Mental Status: She is alert and oriented to person, place, and time.     Gait: Gait normal.  Psychiatric:  Mood and Affect: Mood and affect normal.    ASSESSMENT AND PLAN:  Ms. Nancy Herman was seen today for medical management of chronic issues.  Diagnoses and all orders for this visit:  Stage 3a chronic kidney disease [facility]) Assessment & Plan: Problem has been otherwise stable. In 11/2023 her creatinine was 1.18 and eGFR 48. Continue adequate hydration, low-salt diet, and avoidance of NSAIDs. We will plan on rechecking renal function next visit.   Major depression with psychotic features (HCC) Stable. Not having psychotic episodes since she has been on Risperidone . Follows with psychiatrist regularly. *** Gastroesophageal reflux disease without esophagitis Assessment & Plan: Chest discomfort she reported last visit has resolved. We discussed differential diagnosis. Continue pantoprazole  40 mg before breakfast and famotidine  20 mg at bedtime as well as GERD precautions. Continue following with gastroenterologist.   History of pituitary adenoma Assessment & Plan: S/P transsphenoidal surgery in 2017. Normal pituitary hormones. Follows annually with endocrinologist.  Return in about 6 months (around 08/12/2024) for chronic problems, Labs.   Michele Judy, MD [employer]. [place] [facility].      [1]  Current Outpatient Medications on File Prior to Visit  Medication Sig Dispense Refill   carbamide peroxide (DEBROX) 6.5 % OTIC solution Place 5 drops into the left ear 2 (two) times daily. 15 mL 0   cetirizine (ZYRTEC) 10 MG tablet Take 10 mg by mouth daily.     famotidine  (PEPCID ) 20 MG tablet  Take 1 tablet (20 mg total) by mouth at bedtime. 90 tablet 0   FLUoxetine  (PROZAC ) 20 MG capsule Take 1 capsule (20 mg total) by mouth daily. 30 capsule 7   linaclotide  (LINZESS ) 290 MCG CAPS capsule Take 290 mcg by mouth daily before breakfast.     montelukast  (SINGULAIR ) 10 MG tablet TAKE 1 TABLET BY MOUTH AT BEDTIME 90 tablet 2   pantoprazole  (PROTONIX ) 40 MG tablet Take 1 tablet by mouth once daily 90 tablet 0   risperiDONE  (RISPERDAL ) 0.5 MG tablet 2 qhs 60 tablet 7   rosuvastatin  (CRESTOR ) 20 MG tablet Take 1 tablet by mouth once daily 90 tablet 2   TURMERIC PO Take by mouth.     No current facility-administered medications on file prior to visit.  [2]  Allergies Allergen Reactions   Other Other (See Comments)    Seasonal allergies   Gramineae Pollens Other (See Comments)    Sneezing, runny nose, watery eyes   "

## 2024-02-13 NOTE — Patient Instructions (Signed)
 A few things to remember from today's visit:  Stage 3a chronic kidney disease (HCC)  History of pituitary adenoma Because chest pain has resolved, I do not think we need further workup but if chest pain presents again or any palpitation, shortness of breath we can arrange appointment with cardiologist. Continue pantoprazole  and Pepcid , continue avoiding foods that can aggravate your gastrointestinal symptoms. Arrange an appointment with your gastroenterologist to discuss side effects of Linzess .  If you need refills for medications you take chronically, please call your pharmacy. Do not use My Chart to request refills or for acute issues that need immediate attention. If you send a my chart message, it may take a few days to be addressed, specially if I am not in the office.  Please be sure medication list is accurate. If a new problem present, please set up appointment sooner than planned today.

## 2024-02-13 NOTE — Telephone Encounter (Signed)
 Inbound call from patient stating she is taking linzess  but its been causing her to have diarrhea, patient also states she has been experiencing some bleeding. Would like to speak to nurse and be advised on what to do Requesting a call back Please advise  Thank you

## 2024-02-13 NOTE — Assessment & Plan Note (Signed)
 S/P transsphenoidal surgery in 2017. Normal pituitary hormones. Follows annually with endocrinologist.

## 2024-02-17 ENCOUNTER — Encounter (HOSPITAL_COMMUNITY): Payer: Self-pay | Admitting: Psychiatry

## 2024-02-17 ENCOUNTER — Other Ambulatory Visit: Payer: Self-pay

## 2024-02-17 ENCOUNTER — Ambulatory Visit (HOSPITAL_COMMUNITY): Admitting: Psychiatry

## 2024-02-17 VITALS — BP 116/77 | HR 85 | Ht 63.0 in | Wt 141.0 lb

## 2024-02-17 DIAGNOSIS — F411 Generalized anxiety disorder: Secondary | ICD-10-CM | POA: Diagnosis not present

## 2024-02-17 DIAGNOSIS — F323 Major depressive disorder, single episode, severe with psychotic features: Secondary | ICD-10-CM

## 2024-02-17 DIAGNOSIS — F32A Depression, unspecified: Secondary | ICD-10-CM

## 2024-02-17 MED ORDER — RISPERIDONE 0.5 MG PO TABS: ORAL_TABLET | ORAL | 7 refills | Status: AC

## 2024-02-17 MED ORDER — FLUOXETINE HCL 20 MG PO CAPS: 20.0000 mg | ORAL_CAPSULE | Freq: Every day | ORAL | 7 refills | Status: AC

## 2024-02-17 NOTE — Progress Notes (Unsigned)
 "  Chief Complaint: Discuss meds  HPI:    Nancy Herman is a 79 year old female, known to Dr. Shila, with a past medical history as listed below including GERD and IBS, who was referred to me by Jordan, Betty G, MD for a discussion about medications.    09/04/2022 colonoscopy for history of colon polyps.  2 polyps 3-4 mm sigmoid colon, diverticulosis, nonbleeding external and internal hemorrhoids, no repeat recommended due to age.    02/13/2023 office visit with Alan Coombs, PA-see for dysphagia.  At that time discussed dysphagia with GERD not controlled with Nexium  once daily.  Recommended EGD with dilation.  Also discussed iron deficiency anemia.  Told to continue iron supplementation, discussed recently normal colonoscopy.  Constipation better with Linzess .  Told to add MiraLAX  for hard stools.    02/20/2023 EGD with Z-line regular, benign-appearing esophageal stenosis dilated and 2 cm hiatal hernia.  Told to use Pantoprazole  40 mg p.o. daily.    02/13/2024 patient called in describing Linzess  causing her to have diarrhea.  She was told she needed to have an appointment to discuss further with a possible change in therapy.  Past Medical History:  Diagnosis Date   Anxiety    Frequent headaches    GERD (gastroesophageal reflux disease)    Hyperlipidemia    IBS (irritable bowel syndrome)    Pituitary insufficiency    Thyroid  disease     Past Surgical History:  Procedure Laterality Date   ABDOMINAL HYSTERECTOMY  1990   ANAL RECTAL MANOMETRY N/A 08/14/2018   Procedure: ANO RECTAL MANOMETRY;  Surgeon: Nandigam, Kavitha V, MD;  Location: [facility];  Service: Endoscopy;  Laterality: N/A;   BRAIN SURGERY     BREAST BIOPSY Left 08/23/2022   MM LT BREAST BX W LOC DEV 1ST LESION IMAGE BX SPEC STEREO GUIDE 08/23/2022 [facility]   BREAST EXCISIONAL BIOPSY Right unsure   BREAST EXCISIONAL BIOPSY Left unsure   eyelid surgery Bilateral 08/27/2021   OVARIAN CYST SURGERY  1994   REPAIR  RECTOCELE  2016   and prolapsed uterus.   TONSILLECTOMY  1973    Current Outpatient Medications  Medication Sig Dispense Refill   carbamide peroxide (DEBROX) 6.5 % OTIC solution Place 5 drops into the left ear 2 (two) times daily. 15 mL 0   cetirizine (ZYRTEC) 10 MG tablet Take 10 mg by mouth daily.     famotidine  (PEPCID ) 20 MG tablet Take 1 tablet (20 mg total) by mouth at bedtime. 90 tablet 0   FLUoxetine  (PROZAC ) 20 MG capsule Take 1 capsule (20 mg total) by mouth daily. 30 capsule 7   linaclotide  (LINZESS ) 290 MCG CAPS capsule Take 290 mcg by mouth daily before breakfast.     montelukast  (SINGULAIR ) 10 MG tablet TAKE 1 TABLET BY MOUTH AT BEDTIME 90 tablet 2   pantoprazole  (PROTONIX ) 40 MG tablet Take 1 tablet by mouth once daily 90 tablet 0   risperiDONE  (RISPERDAL ) 0.5 MG tablet 2 qhs 60 tablet 7   rosuvastatin  (CRESTOR ) 20 MG tablet Take 1 tablet by mouth once daily 90 tablet 2   TURMERIC PO Take by mouth.     No current facility-administered medications for this visit.    Allergies as of 02/19/2024 - Review Complete 02/13/2024  Allergen Reaction Noted   Other Other (See Comments) 10/28/2017   Gramineae pollens Other (See Comments) 08/15/2021    Family History  Problem Relation Age of Onset   Heart disease Mother  No details    Alcohol abuse Mother    Drug abuse Mother    Sickle cell anemia Mother    Heart disease Father        No details . Questionable bypass or stent   Drug abuse Father    Alcohol abuse Father    Colon cancer Maternal Aunt    Esophageal cancer Neg Hx    Pancreatic cancer Neg Hx    Stomach cancer Neg Hx    Breast cancer Neg Hx    BRCA 1/2 Neg Hx     Social History   Socioeconomic History   Marital status: Widowed    Spouse name: Not on file   Number of children: 2   Years of education: Not on file   Highest education level: Not on file  Occupational History   Occupation: retired  Tobacco Use   Smoking status: Never   Smokeless  tobacco: Never  Vaping Use   Vaping status: Never Used  Substance and Sexual Activity   Alcohol use: No   Drug use: No   Sexual activity: Never  Other Topics Concern   Not on file  Social History Narrative   Lives alone.  Lives near son.  Was in [place].   Social Drivers of Health   Tobacco Use: Low Risk (02/13/2024)   Patient History    Smoking Tobacco Use: Never    Smokeless Tobacco Use: Never    Passive Exposure: Not on file  Financial Resource Strain: Low Risk (12/18/2023)   Overall Financial Resource Strain (CARDIA)    Difficulty of Paying Living Expenses: Not very hard  Food Insecurity: Food Insecurity Present (12/18/2023)   Epic    Worried About Programme Researcher, Broadcasting/film/video in the Last Year: Sometimes true    Ran Out of Food in the Last Year: Never true  Transportation Needs: No Transportation Needs (12/18/2023)   Epic    Lack of Transportation (Medical): No    Lack of Transportation (Non-Medical): No  Physical Activity: Insufficiently Active (12/18/2023)   Exercise Vital Sign    Days of Exercise per Week: 4 days    Minutes of Exercise per Session: 10 min  Stress: No Stress Concern Present (12/18/2023)   [employer] of Occupational Health - Occupational Stress Questionnaire    Feeling of Stress: Only a little  Social Connections: Moderately Integrated (12/18/2023)   Social Connection and Isolation Panel    Frequency of Communication with Friends and Family: More than three times a week    Frequency of Social Gatherings with Friends and Family: Twice a week    Attends Religious Services: More than 4 times per year    Active Member of [employer] or Organizations: Yes    Attends Banker Meetings: More than 4 times per year    Marital Status: Widowed  Intimate Partner Violence: Not At Risk (12/18/2023)   Epic    Fear of Current or Ex-Partner: No    Emotionally Abused: No    Physically Abused: No    Sexually Abused: No  Depression (PHQ2-9): Low Risk (12/18/2023)    Depression (PHQ2-9)    PHQ-2 Score: 2  Recent Concern: Depression (PHQ2-9) - Medium Risk (11/11/2023)   Depression (PHQ2-9)    PHQ-2 Score: 5  Alcohol Screen: Low Risk (12/18/2023)   Alcohol Screen    Last Alcohol Screening Score (AUDIT): 0  Housing: Low Risk (12/18/2023)   Epic    Unable to Pay for Housing in the Last Year: No  Number of Times Moved in the Last Year: 0    Homeless in the Last Year: No  Utilities: Not At Risk (12/18/2023)   Epic    Threatened with loss of utilities: No  Health Literacy: Adequate Health Literacy (12/18/2023)   B1300 Health Literacy    Frequency of need for help with medical instructions: Never    Review of Systems:    Constitutional: No weight loss, fever, chills, weakness or fatigue HEENT: Eyes: No change in vision               Ears, Nose, Throat:  No change in hearing or congestion Skin: No rash or itching Cardiovascular: No chest pain, chest pressure or palpitations   Respiratory: No SOB or cough Gastrointestinal: See HPI and otherwise negative Genitourinary: No dysuria or change in urinary frequency Neurological: No headache, dizziness or syncope Musculoskeletal: No new muscle or joint pain Hematologic: No bleeding or bruising Psychiatric: No history of depression or anxiety    Physical Exam:  Vital signs: LMP  (LMP Unknown)   Constitutional:   Pleasant Caucasian female appears to be in NAD, Well developed, Well nourished, alert and cooperative Head:  Normocephalic and atraumatic. Eyes:   PEERL, EOMI. No icterus. Conjunctiva pink. Ears:  Normal auditory acuity. Neck:  Supple Throat: Oral cavity and pharynx without inflammation, swelling or lesion.  Respiratory: Respirations even and unlabored. Lungs clear to auscultation bilaterally.   No wheezes, crackles, or rhonchi.  Cardiovascular: Normal S1, S2. No MRG. Regular rate and rhythm. No peripheral edema, cyanosis or pallor.  Gastrointestinal:  Soft, nondistended, nontender. No  rebound or guarding. Normal bowel sounds. No appreciable masses or hepatomegaly. Rectal:  Not performed.  Msk:  Symmetrical without gross deformities. Without edema, no deformity or joint abnormality.  Neurologic:  Alert and  oriented x4;  grossly normal neurologically.  Skin:   Dry and intact without significant lesions or rashes. Psychiatric: Oriented to person, place and time. Demonstrates good judgement and reason without abnormal affect or behaviors.  RELEVANT LABS AND IMAGING: CBC    Component Value Date/Time   WBC 5.5 11/11/2023 1015   RBC 3.75 (L) 11/11/2023 1015   HGB 11.1 (L) 11/11/2023 1015   HCT 33.0 (L) 11/11/2023 1015   PLT 163.0 11/11/2023 1015   MCV 88.1 11/11/2023 1015   MCH 28.9 11/19/2019 1355   MCHC 33.5 11/11/2023 1015   RDW 14.5 11/11/2023 1015   LYMPHSABS 1.7 01/24/2023 1438   MONOABS 0.4 01/24/2023 1438   EOSABS 0.2 01/24/2023 1438   BASOSABS 0.1 01/24/2023 1438    CMP     Component Value Date/Time   NA 143 11/13/2023 1050   K 3.8 11/13/2023 1050   CL 106 11/13/2023 1050   CO2 31 11/13/2023 1050   GLUCOSE 87 11/13/2023 1050   BUN 10 11/13/2023 1050   CREATININE 1.18 (H) 11/13/2023 1050   CALCIUM  9.5 11/13/2023 1050   PROT 6.9 01/24/2023 1438   ALBUMIN 4.2 01/24/2023 1438   AST 21 01/24/2023 1438   ALT 16 01/24/2023 1438   ALKPHOS 52 01/24/2023 1438   BILITOT 0.3 01/24/2023 1438   GFRNONAA 51 (L) 11/19/2019 1355   GFRAA 47 (L) 03/16/2018 2216    Assessment: 1. ***  Plan: 1. ***     Delon Failing, PA-C [facility] 02/17/2024, 1:20 PM  Cc: Jordan, Betty G, MD  "

## 2024-02-17 NOTE — Progress Notes (Signed)
 " Psychiatric Initial Adult Assessment   Patient Identification: Nancy Herman MRN:  [id number] Date of Evaluation:  02/17/2024 Referral Source: Dr. Betty Jordan Chief Complaint:     Visit Diagnosis:    ICD-10-CM   1. Generalized anxiety disorder  F41.1 FLUoxetine  (PROZAC ) 20 MG capsule    2. Depressive disorder  F32.A FLUoxetine  (PROZAC ) 20 MG capsule       History of Present Illness:      Today the patient is doing well.  She is stable.  She lives independently.  She is sleeping and eating well denies any psychotic symptoms at all.  She has no evidence of tardive dyskinesia.  She is being followed closely by her primary care physician.  The patient has 2 children 5 grandchildren and 3 great-grandchildren and she had contact with them some of them during Christmas.  Patient goes to church now and then.  At this time she is very stable.  She denies any symptoms of depression or mania.  She continues to drive without a problem.    Depression Symptoms:  difficulty concentrating, (Hypo) Manic Symptoms:   Anxiety Symptoms:   Psychotic Symptoms:   PTSD Symptoms:   Past Psychiatric History: Prozac  for many years  Previous Psychotropic Medications:   Substance Abuse History in the last 12 months:  No.  Consequences of Substance Abuse:   Past Medical History:  Past Medical History:  Diagnosis Date   Anxiety    Frequent headaches    GERD (gastroesophageal reflux disease)    Hyperlipidemia    IBS (irritable bowel syndrome)    Pituitary insufficiency    Thyroid  disease     Past Surgical History:  Procedure Laterality Date   ABDOMINAL HYSTERECTOMY  1990   ANAL RECTAL MANOMETRY N/A 08/14/2018   Procedure: ANO RECTAL MANOMETRY;  Surgeon: Shila Gustav GAILS, MD;  Location: [facility];  Service: Endoscopy;  Laterality: N/A;   BRAIN SURGERY     BREAST BIOPSY Left 08/23/2022   MM LT BREAST BX W LOC DEV 1ST LESION IMAGE BX SPEC STEREO GUIDE 08/23/2022 [facility]    BREAST EXCISIONAL BIOPSY Right unsure   BREAST EXCISIONAL BIOPSY Left unsure   eyelid surgery Bilateral 08/27/2021   OVARIAN CYST SURGERY  1994   REPAIR RECTOCELE  2016   and prolapsed uterus.   TONSILLECTOMY  1973    Family Psychiatric History:   Family History:  Family History  Problem Relation Age of Onset   Heart disease Mother        No details    Alcohol abuse Mother    Drug abuse Mother    Sickle cell anemia Mother    Heart disease Father        No details . Questionable bypass or stent   Drug abuse Father    Alcohol abuse Father    Colon cancer Maternal Aunt    Esophageal cancer Neg Hx    Pancreatic cancer Neg Hx    Stomach cancer Neg Hx    Breast cancer Neg Hx    BRCA 1/2 Neg Hx     Social History:   Social History   Socioeconomic History   Marital status: Widowed    Spouse name: Not on file   Number of children: 2   Years of education: Not on file   Highest education level: Not on file  Occupational History   Occupation: retired  Tobacco Use   Smoking status: Never   Smokeless tobacco: Never  Vaping Use  Vaping status: Never Used  Substance and Sexual Activity   Alcohol use: No   Drug use: No   Sexual activity: Never  Other Topics Concern   Not on file  Social History Narrative   Lives alone.  Lives near son.  Was in [place].   Social Drivers of Health   Tobacco Use: Low Risk (02/17/2024)   Patient History    Smoking Tobacco Use: Never    Smokeless Tobacco Use: Never    Passive Exposure: Not on file  Financial Resource Strain: Low Risk (12/18/2023)   Overall Financial Resource Strain (CARDIA)    Difficulty of Paying Living Expenses: Not very hard  Food Insecurity: Food Insecurity Present (12/18/2023)   Epic    Worried About Programme Researcher, Broadcasting/film/video in the Last Year: Sometimes true    Ran Out of Food in the Last Year: Never true  Transportation Needs: No Transportation Needs (12/18/2023)   Epic    Lack of Transportation (Medical): No    Lack  of Transportation (Non-Medical): No  Physical Activity: Insufficiently Active (12/18/2023)   Exercise Vital Sign    Days of Exercise per Week: 4 days    Minutes of Exercise per Session: 10 min  Stress: No Stress Concern Present (12/18/2023)   [employer] of Occupational Health - Occupational Stress Questionnaire    Feeling of Stress: Only a little  Social Connections: Moderately Integrated (12/18/2023)   Social Connection and Isolation Panel    Frequency of Communication with Friends and Family: More than three times a week    Frequency of Social Gatherings with Friends and Family: Twice a week    Attends Religious Services: More than 4 times per year    Active Member of [employer] or Organizations: Yes    Attends Banker Meetings: More than 4 times per year    Marital Status: Widowed  Depression (PHQ2-9): Low Risk (12/18/2023)   Depression (PHQ2-9)    PHQ-2 Score: 2  Recent Concern: Depression (PHQ2-9) - Medium Risk (11/11/2023)   Depression (PHQ2-9)    PHQ-2 Score: 5  Alcohol Screen: Low Risk (12/18/2023)   Alcohol Screen    Last Alcohol Screening Score (AUDIT): 0  Housing: Low Risk (12/18/2023)   Epic    Unable to Pay for Housing in the Last Year: No    Number of Times Moved in the Last Year: 0    Homeless in the Last Year: No  Utilities: Not At Risk (12/18/2023)   Epic    Threatened with loss of utilities: No  Health Literacy: Adequate Health Literacy (12/18/2023)   B1300 Health Literacy    Frequency of need for help with medical instructions: Never    Additional Social History:   Allergies:   Allergies  Allergen Reactions   Other Other (See Comments)    Seasonal allergies   Gramineae Pollens Other (See Comments)    Sneezing, runny nose, watery eyes    Metabolic Disorder Labs: Lab Results  Component Value Date   HGBA1C 6.0 11/11/2023   Lab Results  Component Value Date   PROLACTIN 18.8 11/13/2023   PROLACTIN 15.8 11/13/2022   Lab Results   Component Value Date   CHOL 180 11/11/2023   TRIG 55.0 11/11/2023   HDL 77.90 11/11/2023   CHOLHDL 2 11/11/2023   VLDL 11.0 11/11/2023   LDLCALC 91 11/11/2023   LDLCALC 87 11/11/2022   Lab Results  Component Value Date   TSH 2.12 11/13/2023    Therapeutic Level Labs: No results  found for: LITHIUM No results found for: CBMZ No results found for: VALPROATE  Current Medications: Current Outpatient Medications  Medication Sig Dispense Refill   carbamide peroxide (DEBROX) 6.5 % OTIC solution Place 5 drops into the left ear 2 (two) times daily. 15 mL 0   cetirizine (ZYRTEC) 10 MG tablet Take 10 mg by mouth daily.     famotidine  (PEPCID ) 20 MG tablet Take 1 tablet (20 mg total) by mouth at bedtime. 90 tablet 0   linaclotide  (LINZESS ) 290 MCG CAPS capsule Take 290 mcg by mouth daily before breakfast.     montelukast  (SINGULAIR ) 10 MG tablet TAKE 1 TABLET BY MOUTH AT BEDTIME 90 tablet 2   pantoprazole  (PROTONIX ) 40 MG tablet Take 1 tablet by mouth once daily 90 tablet 0   rosuvastatin  (CRESTOR ) 20 MG tablet Take 1 tablet by mouth once daily 90 tablet 2   TURMERIC PO Take by mouth.     FLUoxetine  (PROZAC ) 20 MG capsule Take 1 capsule (20 mg total) by mouth daily. 30 capsule 7   risperiDONE  (RISPERDAL ) 0.5 MG tablet 2 qhs 60 tablet 7   No current facility-administered medications for this visit.    Musculoskeletal: Strength & Muscle Tone: within normal limits Gait & Station: normal Patient leans:   Psychiatric Specialty Exam: ROS  Blood pressure 116/77, pulse 85, height 5' 3 (1.6 m), weight 141 lb (64 kg).Body mass index is 24.98 kg/m.  General Appearance: Casual  Eye Contact:  Good  Speech: Good  Volume:  Normal  Mood:  NA  Affect:  NA and Appropriate  Thought Process:  Coherent  Orientation:  Full (Time, Place, and Person)  Thought Content:  Logical  Suicidal Thoughts:  No  Homicidal Thoughts:  No  Memory:  Negative  Judgement:  Good  Insight:  Fair   Psychomotor Activity:  Normal  Concentration:    Recall:  Good  Fund of Knowledge:Good  Language: Good  Akathisia:  No  Handed:    AIMS (if indicated):  not done  Assets:  Desire for Improvement  ADL's:  Intact  Cognition: WNL  Sleep:  Good   Screenings: GAD-7    Flowsheet Row Office Visit from 11/11/2023 in [facility] [place] Office Visit from 07/29/2022 in [facility] [place]  Total GAD-7 Score 2 2   PHQ2-9    Flowsheet Row Clinical Support from 12/18/2023 in [facility] [place] Office Visit from 11/11/2023 in [facility] [place] Clinical Support from 12/02/2022 in [facility] [place] Office Visit from 11/11/2022 in [facility] [place] Office Visit from 07/29/2022 in [facility] [place]  PHQ-2 Total Score 0 1 0 0 1  PHQ-9 Total Score 2 5 0 0 5   Flowsheet Row ED from 03/16/2018 in [facility] at [facility] ED from 02/22/2018 in 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  She will continue taking Prozac  and Risperdal .  Her risperidone  dose 0.5 mg 2 a day at night is the lowest effective dose.  The patient is very compliant with her care.  She is functioning extremely well.  Will be seen again in 7 months. "

## 2024-02-18 ENCOUNTER — Other Ambulatory Visit: Payer: Self-pay | Admitting: Family Medicine

## 2024-02-18 DIAGNOSIS — J309 Allergic rhinitis, unspecified: Secondary | ICD-10-CM

## 2024-02-19 ENCOUNTER — Encounter: Payer: Self-pay | Admitting: Physician Assistant

## 2024-02-19 ENCOUNTER — Ambulatory Visit: Admitting: Physician Assistant

## 2024-02-19 VITALS — BP 108/60 | HR 82 | Ht 63.0 in | Wt 143.6 lb

## 2024-02-19 DIAGNOSIS — K5904 Chronic idiopathic constipation: Secondary | ICD-10-CM

## 2024-02-19 DIAGNOSIS — K581 Irritable bowel syndrome with constipation: Secondary | ICD-10-CM

## 2024-02-19 MED ORDER — LINACLOTIDE 290 MCG PO CAPS: 290.0000 ug | ORAL_CAPSULE | Freq: Every day | ORAL | 3 refills | Status: AC

## 2024-02-19 NOTE — Patient Instructions (Signed)
 We have sent the following medications to your pharmacy for you to pick up at your convenience: Linzess  290 mcg daily before breakfast.   Start daily fiber supplement (Citrucel, Metamucil or Benifiber) for 2 weeks and increase to twice daily if needed.   _______________________________________________________  If your blood pressure at your visit was 140/90 or greater, please contact your primary care physician to follow up on this.  _______________________________________________________  If you are age 81 or older, your body mass index should be between 23-30. Your Body mass index is 25.44 kg/m. If this is out of the aforementioned range listed, please consider follow up with your Primary Care Provider.  If you are age 36 or younger, your body mass index should be between 19-25. Your Body mass index is 25.44 kg/m. If this is out of the aformentioned range listed, please consider follow up with your Primary Care Provider.   ________________________________________________________  The [facility] providers would like to encourage you to use MYCHART to communicate with providers for non-urgent requests or questions.  Due to long hold times on the telephone, sending your provider a message by [facility] may be a faster and more efficient way to get a response.  Please allow 48 business hours for a response.  Please remember that this is for non-urgent requests.  _______________________________________________________  [facility] is using a team-based approach to care.  Your team is made up of your doctor and two to three APPS. Our APPS (Nurse Practitioners and Physician Assistants) work with your physician to ensure care continuity for you. They are fully qualified to address your health concerns and develop a treatment plan. They communicate directly with your gastroenterologist to care for you. Seeing the Advanced Practice Practitioners on your physician's team can help you by  facilitating care more promptly, often allowing for earlier appointments, access to diagnostic testing, procedures, and other specialty referrals.

## 2024-08-13 ENCOUNTER — Ambulatory Visit: Admitting: Family Medicine

## 2024-09-14 ENCOUNTER — Ambulatory Visit (HOSPITAL_COMMUNITY): Admitting: Psychiatry

## 2024-11-12 ENCOUNTER — Encounter: Admitting: Family Medicine
